# Patient Record
Sex: Female | Born: 1960 | Race: Black or African American | Hispanic: No | Marital: Single | Smoking: Current every day smoker
Health system: Southern US, Community
[De-identification: ages and names within clinical notes are randomized; demographics above are authoritative.]

## PROBLEM LIST (undated history)

## (undated) DIAGNOSIS — E119 Type 2 diabetes mellitus without complications: Secondary | ICD-10-CM

## (undated) DIAGNOSIS — F209 Schizophrenia, unspecified: Secondary | ICD-10-CM

## (undated) DIAGNOSIS — I059 Rheumatic mitral valve disease, unspecified: Secondary | ICD-10-CM

## (undated) DIAGNOSIS — J4489 Other specified chronic obstructive pulmonary disease: Secondary | ICD-10-CM

## (undated) DIAGNOSIS — Z720 Tobacco use: Secondary | ICD-10-CM

## (undated) DIAGNOSIS — J449 Chronic obstructive pulmonary disease, unspecified: Secondary | ICD-10-CM

## (undated) DIAGNOSIS — B1910 Unspecified viral hepatitis B without hepatic coma: Secondary | ICD-10-CM

## (undated) DIAGNOSIS — I351 Nonrheumatic aortic (valve) insufficiency: Secondary | ICD-10-CM

## (undated) DIAGNOSIS — I350 Nonrheumatic aortic (valve) stenosis: Secondary | ICD-10-CM

## (undated) DIAGNOSIS — R569 Unspecified convulsions: Secondary | ICD-10-CM

## (undated) DIAGNOSIS — B20 Human immunodeficiency virus [HIV] disease: Secondary | ICD-10-CM

## (undated) DIAGNOSIS — Z9049 Acquired absence of other specified parts of digestive tract: Secondary | ICD-10-CM

## (undated) DIAGNOSIS — K219 Gastro-esophageal reflux disease without esophagitis: Secondary | ICD-10-CM

## (undated) DIAGNOSIS — I272 Pulmonary hypertension, unspecified: Secondary | ICD-10-CM

## (undated) DIAGNOSIS — I5032 Chronic diastolic (congestive) heart failure: Secondary | ICD-10-CM

## (undated) HISTORY — DX: Gastro-esophageal reflux disease without esophagitis: K21.9

## (undated) HISTORY — DX: Schizophrenia, unspecified: F20.9

## (undated) HISTORY — DX: Acquired absence of other specified parts of digestive tract: Z90.49

## (undated) HISTORY — DX: Unspecified convulsions: R56.9

## (undated) HISTORY — DX: Human immunodeficiency virus (HIV) disease: B20

## (undated) HISTORY — PX: TONSILLECTOMY: SUR1361

## (undated) HISTORY — PX: CHOLECYSTECTOMY: SHX55

## (undated) HISTORY — PX: LEG SURGERY: SHX1003

## (undated) HISTORY — DX: Unspecified viral hepatitis B without hepatic coma: B19.10

## (undated) HISTORY — DX: Chronic obstructive pulmonary disease, unspecified: J44.9

---

## 2002-12-09 ENCOUNTER — Inpatient Hospital Stay (HOSPITAL_COMMUNITY): Admission: EM | Admit: 2002-12-09 | Discharge: 2002-12-13 | Payer: Self-pay | Admitting: Psychiatry

## 2003-04-11 ENCOUNTER — Emergency Department (HOSPITAL_COMMUNITY): Admission: EM | Admit: 2003-04-11 | Discharge: 2003-04-12 | Payer: Self-pay | Admitting: Emergency Medicine

## 2005-11-03 ENCOUNTER — Emergency Department (HOSPITAL_COMMUNITY): Admission: EM | Admit: 2005-11-03 | Discharge: 2005-11-03 | Payer: Self-pay | Admitting: Emergency Medicine

## 2007-12-10 DIAGNOSIS — B20 Human immunodeficiency virus [HIV] disease: Secondary | ICD-10-CM

## 2007-12-10 HISTORY — DX: Human immunodeficiency virus (HIV) disease: B20

## 2008-02-22 ENCOUNTER — Inpatient Hospital Stay (HOSPITAL_COMMUNITY): Admission: EM | Admit: 2008-02-22 | Discharge: 2008-02-27 | Payer: Self-pay | Admitting: Emergency Medicine

## 2008-04-10 ENCOUNTER — Inpatient Hospital Stay (HOSPITAL_COMMUNITY): Admission: EM | Admit: 2008-04-10 | Discharge: 2008-04-11 | Payer: Self-pay | Admitting: Emergency Medicine

## 2008-04-11 ENCOUNTER — Inpatient Hospital Stay (HOSPITAL_COMMUNITY): Admission: AD | Admit: 2008-04-11 | Discharge: 2008-04-29 | Payer: Self-pay | Admitting: Psychiatry

## 2008-04-11 ENCOUNTER — Ambulatory Visit: Payer: Self-pay | Admitting: Psychiatry

## 2008-04-14 ENCOUNTER — Encounter: Payer: Self-pay | Admitting: Psychiatry

## 2008-09-14 ENCOUNTER — Encounter: Admission: RE | Admit: 2008-09-14 | Discharge: 2008-09-14 | Payer: Self-pay | Admitting: Pulmonary Disease

## 2009-05-08 ENCOUNTER — Ambulatory Visit: Payer: Self-pay | Admitting: Diagnostic Radiology

## 2009-05-08 ENCOUNTER — Emergency Department (HOSPITAL_BASED_OUTPATIENT_CLINIC_OR_DEPARTMENT_OTHER): Admission: EM | Admit: 2009-05-08 | Discharge: 2009-05-08 | Payer: Self-pay | Admitting: Emergency Medicine

## 2009-08-04 ENCOUNTER — Emergency Department (HOSPITAL_BASED_OUTPATIENT_CLINIC_OR_DEPARTMENT_OTHER): Admission: EM | Admit: 2009-08-04 | Discharge: 2009-08-04 | Payer: Self-pay | Admitting: Emergency Medicine

## 2009-08-04 ENCOUNTER — Ambulatory Visit: Payer: Self-pay | Admitting: Diagnostic Radiology

## 2009-09-11 ENCOUNTER — Inpatient Hospital Stay (HOSPITAL_COMMUNITY): Admission: EM | Admit: 2009-09-11 | Discharge: 2009-09-13 | Payer: Self-pay | Admitting: Emergency Medicine

## 2009-09-16 ENCOUNTER — Emergency Department (HOSPITAL_BASED_OUTPATIENT_CLINIC_OR_DEPARTMENT_OTHER): Admission: EM | Admit: 2009-09-16 | Discharge: 2009-09-16 | Payer: Self-pay | Admitting: Emergency Medicine

## 2010-06-27 LAB — RAPID URINE DRUG SCREEN, HOSP PERFORMED
Amphetamines: NOT DETECTED
Benzodiazepines: NOT DETECTED
Cocaine: NOT DETECTED
Tetrahydrocannabinol: NOT DETECTED

## 2010-06-27 LAB — URINALYSIS, ROUTINE W REFLEX MICROSCOPIC
Bilirubin Urine: NEGATIVE
Bilirubin Urine: NEGATIVE
Glucose, UA: NEGATIVE mg/dL
Hgb urine dipstick: NEGATIVE
Ketones, ur: 15 mg/dL — AB
Ketones, ur: NEGATIVE mg/dL
Ketones, ur: NEGATIVE mg/dL
Nitrite: NEGATIVE
Nitrite: POSITIVE — AB
Nitrite: NEGATIVE
Protein, ur: 30 mg/dL — AB
Protein, ur: NEGATIVE mg/dL
Protein, ur: NEGATIVE mg/dL
Specific Gravity, Urine: 1.026 (ref 1.005–1.030)
Specific Gravity, Urine: 1.015 (ref 1.005–1.030)
Urobilinogen, UA: 1 mg/dL (ref 0.0–1.0)
Urobilinogen, UA: 0.2 mg/dL (ref 0.0–1.0)
Urobilinogen, UA: 0.2 mg/dL (ref 0.0–1.0)
pH: 5.5 (ref 5.0–8.0)

## 2010-06-27 LAB — BASIC METABOLIC PANEL
BUN: 13 mg/dL (ref 6–23)
BUN: 14 mg/dL (ref 6–23)
CO2: 26 mEq/L (ref 19–32)
Calcium: 9.3 mg/dL (ref 8.4–10.5)
Chloride: 108 mEq/L (ref 96–112)
Chloride: 104 mEq/L (ref 96–112)
Creatinine, Ser: 0.9 mg/dL (ref 0.4–1.2)
GFR calc Af Amer: >60 mL/min (ref >60)
GFR calc non Af Amer: >60 mL/min (ref >60)
Glucose, Bld: 134 mg/dL — ABNORMAL HIGH (ref 70–99)
Glucose, Bld: 106 mg/dL — ABNORMAL HIGH (ref 70–99)
Potassium: 3.9 mEq/L (ref 3.5–5.1)
Potassium: 3.8 mEq/L (ref 3.5–5.1)
Sodium: 142 mEq/L (ref 135–145)

## 2010-06-27 LAB — CBC
HCT: 43.6 % (ref 36.0–46.0)
HCT: 33.6 % — ABNORMAL LOW (ref 36.0–46.0)
HCT: 37 % (ref 36.0–46.0)
HCT: 31.6 % — ABNORMAL LOW (ref 36.0–46.0)
Hemoglobin: 14.3 g/dL (ref 12.0–15.0)
Hemoglobin: 11.3 g/dL — ABNORMAL LOW (ref 12.0–15.0)
MCHC: 32.7 g/dL (ref 30.0–36.0)
MCHC: 32.3 g/dL (ref 30.0–36.0)
MCV: 91.4 fL (ref 78.0–100.0)
MCV: 92.7 fL (ref 78.0–100.0)
MCV: 92.5 fL (ref 78.0–100.0)
Platelets: 220 10*3/uL (ref 150–400)
Platelets: 201 10*3/uL (ref 150–400)
Platelets: 208 10*3/uL (ref 150–400)
Platelets: 424 10*3/uL — ABNORMAL HIGH (ref 150–400)
RBC: 4.78 MIL/uL (ref 3.87–5.11)
RDW: 14.1 % (ref 11.5–15.5)
RDW: 13.8 % (ref 11.5–15.5)
WBC: 6 10*3/uL (ref 4.0–10.5)
WBC: 7.7 10*3/uL (ref 4.0–10.5)
WBC: 8.2 10*3/uL (ref 4.0–10.5)

## 2010-06-27 LAB — COMPREHENSIVE METABOLIC PANEL
ALT: 9 U/L (ref 0–35)
AST: 20 U/L (ref 0–37)
Albumin: 2.7 g/dL — ABNORMAL LOW (ref 3.5–5.2)
Albumin: 3.4 g/dL — ABNORMAL LOW (ref 3.5–5.2)
Alkaline Phosphatase: 77 U/L (ref 39–117)
BUN: 8 mg/dL (ref 6–23)
BUN: 8 mg/dL (ref 6–23)
Calcium: 9.1 mg/dL (ref 8.4–10.5)
Chloride: 112 mEq/L (ref 96–112)
Chloride: 112 mEq/L (ref 96–112)
Creatinine, Ser: 0.8 mg/dL (ref 0.4–1.2)
GFR calc Af Amer: >60 mL/min (ref >60)
Glucose, Bld: 112 mg/dL — ABNORMAL HIGH (ref 70–99)
Potassium: 3.6 mEq/L (ref 3.5–5.1)
Sodium: 138 mEq/L (ref 135–145)
Total Bilirubin: 0.5 mg/dL (ref 0.3–1.2)
Total Bilirubin: 0.3 mg/dL (ref 0.3–1.2)

## 2010-06-27 LAB — ACETAMINOPHEN LEVEL: Acetaminophen (Tylenol), Serum: <10 ug/mL — ABNORMAL LOW (ref 10–30)

## 2010-06-27 LAB — DIFFERENTIAL
Basophils Absolute: 0.1 10*3/uL (ref 0.0–0.1)
Basophils Absolute: 0.1 10*3/uL (ref 0.0–0.1)
Basophils Relative: 1 % (ref 0–1)
Eosinophils Absolute: 0.2 10*3/uL (ref 0.0–0.7)
Eosinophils Absolute: 0.4 10*3/uL (ref 0.0–0.7)
Eosinophils Relative: 2 % (ref 0–5)
Eosinophils Relative: 5 % (ref 0–5)
Eosinophils Relative: 3 % (ref 0–5)
Lymphocytes Relative: 34 % (ref 12–46)
Lymphs Abs: 0.9 10*3/uL (ref 0.7–4.0)
Lymphs Abs: 2.4 10*3/uL (ref 0.7–4.0)
Monocytes Absolute: 0.8 10*3/uL (ref 0.1–1.0)
Monocytes Absolute: 0.4 10*3/uL (ref 0.1–1.0)
Monocytes Relative: 10 % (ref 3–12)
Monocytes Relative: 6 % (ref 3–12)
Neutro Abs: 4.8 10*3/uL (ref 1.7–7.7)
Neutro Abs: 4.1 10*3/uL (ref 1.7–7.7)

## 2010-06-27 LAB — POCT TOXICOLOGY PANEL: Benzodiazepines: POSITIVE

## 2010-06-27 LAB — CULTURE, BLOOD (ROUTINE X 2)

## 2010-06-27 LAB — URINE MICROSCOPIC-ADD ON

## 2010-06-27 LAB — URINE CULTURE

## 2010-06-27 LAB — ETHANOL
Alcohol, Ethyl (B): <5 mg/dL (ref 0–10)
Alcohol, Ethyl (B): <5 mg/dL (ref 0–10)

## 2010-06-27 LAB — GLUCOSE, CAPILLARY: Glucose-Capillary: 114 mg/dL — ABNORMAL HIGH (ref 70–99)

## 2010-06-27 LAB — MRSA PCR SCREENING: MRSA by PCR: NEGATIVE

## 2010-06-27 LAB — SALICYLATE LEVEL: Salicylate Lvl: <4 mg/dL (ref 2.8–20.0)

## 2010-06-28 LAB — URINALYSIS, ROUTINE W REFLEX MICROSCOPIC
Bilirubin Urine: NEGATIVE
Hgb urine dipstick: NEGATIVE
Ketones, ur: NEGATIVE mg/dL
Specific Gravity, Urine: 1.008 (ref 1.005–1.030)
pH: 6.5 (ref 5.0–8.0)

## 2010-06-28 LAB — COMPREHENSIVE METABOLIC PANEL
AST: 40 U/L — ABNORMAL HIGH (ref 0–37)
BUN: 9 mg/dL (ref 6–23)
CO2: 26 mEq/L (ref 19–32)
Chloride: 111 mEq/L (ref 96–112)
Creatinine, Ser: 0.9 mg/dL (ref 0.4–1.2)
GFR calc Af Amer: >60 mL/min (ref >60)
GFR calc non Af Amer: >60 mL/min (ref >60)
Glucose, Bld: 94 mg/dL (ref 70–99)
Total Bilirubin: 1.4 mg/dL — ABNORMAL HIGH (ref 0.3–1.2)

## 2010-06-28 LAB — LITHIUM LEVEL: Lithium Lvl: 0.86 mEq/L (ref 0.80–1.40)

## 2010-06-28 LAB — CBC
HCT: 39.7 % (ref 36.0–46.0)
MCHC: 32.2 g/dL (ref 30.0–36.0)
MCV: 94.2 fL (ref 78.0–100.0)
RBC: 4.22 MIL/uL (ref 3.87–5.11)
WBC: 8.1 10*3/uL (ref 4.0–10.5)

## 2010-06-28 LAB — POCT TOXICOLOGY PANEL

## 2010-06-28 LAB — DIFFERENTIAL
Basophils Absolute: 0 10*3/uL (ref 0.0–0.1)
Eosinophils Relative: 7 % — ABNORMAL HIGH (ref 0–5)
Lymphocytes Relative: 33 % (ref 12–46)
Neutrophils Relative %: 54 % (ref 43–77)

## 2010-07-25 LAB — COMPREHENSIVE METABOLIC PANEL
ALT: 23 U/L (ref 0–35)
ALT: 23 U/L (ref 0–35)
AST: 27 U/L (ref 0–37)
AST: 29 U/L (ref 0–37)
Albumin: 2.9 g/dL — ABNORMAL LOW (ref 3.5–5.2)
Alkaline Phosphatase: 89 U/L (ref 39–117)
CO2: 29 mEq/L (ref 19–32)
Chloride: 104 mEq/L (ref 96–112)
Chloride: 106 mEq/L (ref 96–112)
GFR calc Af Amer: >60 mL/min (ref >60)
GFR calc non Af Amer: 56 mL/min — ABNORMAL LOW (ref >60)
Potassium: 4.6 mEq/L (ref 3.5–5.1)
Sodium: 137 mEq/L (ref 135–145)
Sodium: 138 mEq/L (ref 135–145)
Total Bilirubin: 0.3 mg/dL (ref 0.3–1.2)
Total Protein: 6.6 g/dL (ref 6.0–8.3)

## 2010-07-25 LAB — CBC
HCT: 34.7 % — ABNORMAL LOW (ref 36.0–46.0)
Hemoglobin: 11.5 g/dL — ABNORMAL LOW (ref 12.0–15.0)
Platelets: 245 10*3/uL (ref 150–400)
RBC: 3.69 MIL/uL — ABNORMAL LOW (ref 3.87–5.11)
RBC: 3.75 MIL/uL — ABNORMAL LOW (ref 3.87–5.11)
RDW: 14.2 % (ref 11.5–15.5)
WBC: 4.7 10*3/uL (ref 4.0–10.5)

## 2010-07-25 LAB — DIFFERENTIAL
Basophils Absolute: 0 10*3/uL (ref 0.0–0.1)
Basophils Relative: 2 % — ABNORMAL HIGH (ref 0–1)
Eosinophils Absolute: 0.3 10*3/uL (ref 0.0–0.7)
Eosinophils Relative: 8 % — ABNORMAL HIGH (ref 0–5)
Lymphs Abs: 1.5 10*3/uL (ref 0.7–4.0)
Monocytes Absolute: 0.3 10*3/uL (ref 0.1–1.0)
Monocytes Absolute: 0.4 10*3/uL (ref 0.1–1.0)
Monocytes Relative: 10 % (ref 3–12)

## 2010-07-25 LAB — URINALYSIS, ROUTINE W REFLEX MICROSCOPIC
Glucose, UA: NEGATIVE mg/dL
Hgb urine dipstick: NEGATIVE
Protein, ur: 30 mg/dL — AB

## 2010-07-25 LAB — RAPID URINE DRUG SCREEN, HOSP PERFORMED
Amphetamines: NOT DETECTED
Barbiturates: NOT DETECTED
Opiates: NOT DETECTED

## 2010-07-25 LAB — HEPATITIS PANEL, ACUTE
HCV Ab: NEGATIVE
Hepatitis B Surface Ag: NEGATIVE

## 2010-07-25 LAB — URINE MICROSCOPIC-ADD ON

## 2010-07-25 LAB — HEPATIC FUNCTION PANEL
AST: 19 U/L (ref 0–37)
Albumin: 3.2 g/dL — ABNORMAL LOW (ref 3.5–5.2)
Total Bilirubin: 0.5 mg/dL (ref 0.3–1.2)

## 2010-07-26 ENCOUNTER — Ambulatory Visit (INDEPENDENT_AMBULATORY_CARE_PROVIDER_SITE_OTHER): Payer: Medicaid Other

## 2010-07-26 DIAGNOSIS — B20 Human immunodeficiency virus [HIV] disease: Secondary | ICD-10-CM

## 2010-07-26 DIAGNOSIS — Z113 Encounter for screening for infections with a predominantly sexual mode of transmission: Secondary | ICD-10-CM

## 2010-07-26 LAB — CBC WITH DIFFERENTIAL/PLATELET
Basophils Relative: 0 % (ref 0–1)
Eosinophils Absolute: 0.2 10*3/uL (ref 0.0–0.7)
Hemoglobin: 15.7 g/dL — ABNORMAL HIGH (ref 12.0–15.0)
MCH: 30.5 pg (ref 26.0–34.0)
MCHC: 34.2 g/dL (ref 30.0–36.0)
Monocytes Relative: 6 % (ref 3–12)
Neutrophils Relative %: 43 % (ref 43–77)
Platelets: 241 10*3/uL (ref 150–400)
RDW: 15.4 % (ref 11.5–15.5)

## 2010-07-26 LAB — URINALYSIS, ROUTINE W REFLEX MICROSCOPIC
Glucose, UA: NEGATIVE mg/dL
Specific Gravity, Urine: 1.028 (ref 1.005–1.030)
pH: 5.5 (ref 5.0–8.0)

## 2010-07-27 LAB — GC/CHLAMYDIA PROBE AMP, URINE
Chlamydia, Swab/Urine, PCR: NEGATIVE
GC Probe Amp, Urine: NEGATIVE

## 2010-07-27 LAB — COMPLETE METABOLIC PANEL WITH GFR
AST: 22 U/L (ref 0–37)
BUN: 13 mg/dL (ref 6–23)
CO2: 21 mEq/L (ref 19–32)
Calcium: 10 mg/dL (ref 8.4–10.5)
Chloride: 104 mEq/L (ref 96–112)
Creat: 1.17 mg/dL (ref 0.40–1.20)
GFR, Est African American: 59 mL/min — ABNORMAL LOW (ref >60)

## 2010-07-27 LAB — URINALYSIS, MICROSCOPIC ONLY: Crystals: NONE SEEN

## 2010-07-27 LAB — HIV-1 RNA QUANT-NO REFLEX-BLD: HIV 1 RNA Quant: 25 copies/mL — ABNORMAL HIGH (ref <20)

## 2010-07-27 LAB — T-HELPER CELL (CD4) - (RCID CLINIC ONLY): CD4 T Cell Abs: 670 uL (ref 400–2700)

## 2010-07-27 LAB — RPR

## 2010-08-12 ENCOUNTER — Ambulatory Visit: Payer: Self-pay | Admitting: Adult Health

## 2010-08-18 ENCOUNTER — Encounter: Payer: Self-pay | Admitting: Adult Health

## 2010-08-18 ENCOUNTER — Ambulatory Visit (INDEPENDENT_AMBULATORY_CARE_PROVIDER_SITE_OTHER): Payer: Medicaid Other | Admitting: Adult Health

## 2010-08-18 DIAGNOSIS — B009 Herpesviral infection, unspecified: Secondary | ICD-10-CM | POA: Insufficient documentation

## 2010-08-18 DIAGNOSIS — B20 Human immunodeficiency virus [HIV] disease: Secondary | ICD-10-CM

## 2010-08-18 DIAGNOSIS — J45909 Unspecified asthma, uncomplicated: Secondary | ICD-10-CM | POA: Insufficient documentation

## 2010-08-18 DIAGNOSIS — R05 Cough: Secondary | ICD-10-CM

## 2010-08-18 DIAGNOSIS — Z79899 Other long term (current) drug therapy: Secondary | ICD-10-CM

## 2010-08-18 DIAGNOSIS — F209 Schizophrenia, unspecified: Secondary | ICD-10-CM

## 2010-08-18 DIAGNOSIS — Z21 Asymptomatic human immunodeficiency virus [HIV] infection status: Secondary | ICD-10-CM

## 2010-08-18 MED ORDER — ALBUTEROL SULFATE HFA 108 (90 BASE) MCG/ACT IN AERS: 2.0000 | INHALATION_SPRAY | Freq: Four times a day (QID) | RESPIRATORY_TRACT | Status: DC | PRN

## 2010-08-18 MED ORDER — BECLOMETHASONE DIPROPIONATE 40 MCG/ACT IN AERS: 2.0000 | INHALATION_SPRAY | Freq: Two times a day (BID) | RESPIRATORY_TRACT | Status: DC

## 2010-08-18 MED ORDER — HYDROCOD POLST-CHLORPHEN POLST 10-8 MG/5ML PO LQCR: 5.0000 mL | Freq: Two times a day (BID) | ORAL | Status: DC | PRN

## 2010-08-18 NOTE — Progress Notes (Signed)
Subjective:    Patient ID: [name], female    DOB: 1960/09/04, 50 y.o.   MRN: [id number]  HPI 50 year old [place] American female with a history of HIV, diagnosed in 2009 presents to clinic for initial evaluation and ongoing care. Of her HIV as a transfer from [facility]. She has a past history of medication resistance and is currently taking Kaletra, Isentress, and Truvada for her HIV. She claims adherence to this regimen, with no missed doses and good tolerance. She relates having breathing difficulty, over the past month with chest tightness, wheezing, and chronic cough, along with shortness of breath, or the same period of time. Long term history of asthma, for which she is received albuterol therapy. However, she has not had a refill on her albuterol inhaler for "some time." She also is relating the issue of her Zyprexa not helping her with sleep at night and requesting "something different." This medication was originally prescribed by her psychiatrist.    Review of Systems  Constitutional: Positive for diaphoresis, activity change and fatigue. Negative for fever, chills, appetite change and unexpected weight change.  HENT: Negative for hearing loss, ear pain, nosebleeds, congestion, sore throat, facial swelling, rhinorrhea, sneezing, drooling, mouth sores, trouble swallowing, neck pain, neck stiffness, dental problem, voice change, postnasal drip, sinus pressure, tinnitus and ear discharge.   Eyes: Negative for photophobia, pain, discharge, redness, itching and visual disturbance.  Respiratory: Positive for cough, choking, chest tightness, shortness of breath, wheezing and stridor. Negative for apnea.   Cardiovascular: Negative for chest pain, palpitations and leg swelling.  Gastrointestinal: Negative for nausea, vomiting, abdominal pain, diarrhea, constipation, blood in stool, abdominal distention, anal bleeding and rectal pain.  Genitourinary: Negative for dysuria,  urgency, frequency, hematuria, flank pain, decreased urine volume, vaginal bleeding, vaginal discharge, enuresis, difficulty urinating, genital sores, vaginal pain, menstrual problem, pelvic pain and dyspareunia.  Musculoskeletal: Negative for myalgias, back pain, joint swelling, arthralgias and gait problem.  Skin: Negative for color change, pallor, rash and wound.  Neurological: Negative for dizziness, tremors, seizures, syncope, facial asymmetry, speech difficulty, weakness, light-headedness, numbness and headaches.  Hematological: Negative for adenopathy. Does not bruise/bleed easily.  Psychiatric/Behavioral: Positive for behavioral problems, sleep disturbance, dysphoric mood and decreased concentration. Negative for suicidal ideas, hallucinations, confusion, self-injury and agitation. The patient is nervous/anxious and is hyperactive.        Objective:   Physical Exam  Constitutional: She is oriented to person, place, and time. She appears well-developed. She appears distressed.       Underweight-appearing  HENT:  Head: Normocephalic and atraumatic.  Right Ear: External ear normal.  Left Ear: External ear normal.  Mouth/Throat: No oropharyngeal exudate.  Eyes: Conjunctivae and EOM are normal. Pupils are equal, round, and reactive to light. Right eye exhibits no discharge. Left eye exhibits no discharge. No scleral icterus.  Neck: Normal range of motion. Neck supple. No JVD present. No tracheal deviation present. No thyromegaly present.  Cardiovascular: Regular rhythm, normal heart sounds and intact distal pulses.  Exam reveals no gallop and no friction rub.   No murmur heard.      Pulse rate, tachycardia  Pulmonary/Chest: Accessory muscle usage present. No stridor. Tachypnea noted. She has decreased breath sounds in the right upper field, the right lower field, the left upper field and the left lower field. She has wheezes in the right upper field and the left upper field. She has no  rhonchi. She has no rales.       Poor  air exchange noted. Specifically, in the upper lung fields, as well as lower fields.  Abdominal: Soft. Bowel sounds are normal. She exhibits no distension and no mass. There is no tenderness. There is no rebound and no guarding.  Musculoskeletal: Normal range of motion. She exhibits no edema and no tenderness.  Lymphadenopathy:    She has no cervical adenopathy.  Neurological: She is alert and oriented to person, place, and time. No cranial nerve deficit. She exhibits normal muscle tone. Coordination normal.  Skin: Skin is warm and dry. No rash noted. No erythema. No pallor.  Psychiatric: Thought content normal. Her mood appears anxious. Her speech is rapid and/or pressured. She is is hyperactive. She expresses impulsivity. She does not express inappropriate judgment. She exhibits abnormal recent memory and abnormal remote memory.          Assessment & Plan:  1. Asthma. Currently, demonstrating acute asthma exacerbation. She was given a nebulizer treatment with albuterol 0.083%. Her peak flow pretreatment was 150 mL's. Best of 3 tries. Posttreatment peak flow was again 150 mL's best of 3 tries. Lung exam demonstrated clearing and better air exchange in both upper and lower lung fields. Her coughing has subsequently subsided after her treatment. We will renew her albuterol MDI and begin Qvar 40 mcg 2 puffs twice a day as standard maintenance therapy. We were to order PFTs, but we found. Results from tests performed in January 2012. Recommend further followup with her primary care.  2. HIV. Labs obtained on 07/26/2010 show a CD4 count of 670 at 24% with an HIV viral load of 25 copies per mL. She is apparently stable on her current regimen, which includes Kaletra, Truvada, and Isentress. Recommend continuing present management with repeat labs in 10 weeks and a followup in 3 months. She verbally acknowledged this and agreed with plan of care.  3. Schizophrenia.  Appears somewhat hypomanic today and is addressing issues regarding her current therapy. We recommend she contact her psychiatrist for further evaluation. Regarding her current medications which include Zyprexa and lithium, for either dose modification or medication changes. She verbally acknowledged this and agreed with plan.  4. Routine Health Maintenance. She receives Pap smears from her primary on an annual basis and will be scheduling a followup appointment for a Pap smear within the next "couple months." She was informed if we are unable to obtain results of the Pap smear, we will need to schedule a Pap smear with our clinic, here. She verbally acknowledged this.

## 2010-08-19 NOTE — Progress Notes (Signed)
Phone call to [facility].  [facility].  Calea Hribar, RN 

## 2010-08-19 NOTE — Progress Notes (Signed)
Phone call to [facility].  [facility].  Jennet Maduro, RN

## 2010-08-23 NOTE — Procedures (Signed)
EEG NUMBER:  [id number].   TECHNICIAN:  GQ - DY   PHYSICIAN:  [facility]   REFERRING PHYSICIAN:  None.   This is a portable EEG recording for a patient who is currently  hospitalized at the [facility] due to loss of consciousness and  witnessed seizure that occurred later.  It is now presumed that the  patient had 2 seizures.  She lost bowel and bladder control.  The  patient has a history of HIV and schizophrenia.  There is also a  possible history of IV drug abuse.   CURRENT MEDICATIONS:  Protonix, Kaletra, Truvada antiviral, __________  antiviral, Zithromax, Septra, Bactrim, Keppra.  We just started Zofran,  oxycodone, Dilaudid, Ativan, and Ventolin.   This 50 year old female is admitted with syncope and fall.  The patient  has a history of schizophrenia, and a witnessed seizure in the ER lead  to the EEG recording.   DESCRIPTION:  A posterior dominant background rhythm is seen at 8 Hz.  There is plenty of motion artifacts seen on this recording and this  persists through all montages and review techniques, frequent loss of  impedance.  EKG electrode is the only one that seems to stay steady at  60 beats per minute.  Photic stimulation leads to electromyographic  artifact.  Interestingly, this electromyographic artifact remains at 5  Hz through all stimulation frequencies.  Hyperventilation could not be  performed.  Apparently, the patient has a history of asthma.   CONCLUSION:  This is a completely uninterpretable EEG due to the  patient's motion artifact which seems not to cease for even 10 seconds.  A repeat study is recommended when the patient is able to cooperate.  There seems to be left-sided slowing in some of the __________ reviewed  and muscle artifact is dominantly seen over the right hemisphere.  Again, this may be due to impedance loss and I would strongly recommend  a repeat study.  There was no evidence that the patient ever fell  asleep.  There was no  evidence of clinical seizure activity or seizure-  like  activity according to the technicians.  The study is qualitatively too  poor to allow a statement as to normal versus abnormal EEG.      Melvyn Novas, M.D.  Electronically Signed     EA:[id number]  D:  02/24/2008 22:31:25  T:  02/25/2008 07:12:40  Job #:  [id number]   cc:   [facility]

## 2010-08-23 NOTE — H&P (Signed)
[name], [name]                 ACCOUNT NO.:  [id number]   MEDICAL RECORD NO.:  [id number]          PATIENT TYPE:  INP   LOCATION:  0113                         FACILITY:  [facility]   PHYSICIAN:  Oswald Hillock, MD        DATE OF BIRTH:  [birth year]   DATE OF ADMISSION:  04/10/2008  DATE OF DISCHARGE:                              HISTORY & PHYSICAL   PRIMARY CARE PHYSICIAN:  Unassigned.   CHIEF COMPLAINT:  Vomiting and agitation.   HISTORY OF PRESENT ILLNESS:  Patient is a 50 year old African American  female who is brought to the emergency room via EMS after family  reported complaints of vomiting, abdominal pain, and acute agitation.  Apparently, the patient has been to multiple hospitals in the last few  months, including an admission here at [facility] in November, 2009 and  a subsequent admission in [facility], where she underwent laparoscopic  cholecystectomy.  Patient has been doing well after the surgery;  however, over the last few days, there is a report of vomiting, per the  family; however, the patient denies this, and she has not had any  vomiting in the ER since arrival; however, she continues to be acutely  agitated and wants to go out and smoke and is being uncooperative.   Patient does have a history of schizophrenia, and it is unclear whether  she has been taking her medications.  She denies any cough, fever,  rigors, chills, chest pain, shortness of breath, palpitations,  dizziness, diaphoresis, loss of consciousness, or any focal weakness of  any part of the body.  No reports of any diarrhea or any bleeding p.r.  reported by the patient.   PAST MEDICAL HISTORY:  1. Significant for HIV/AIDS, which was diagnosed in June, 2009      secondary to her having severe unintentional weight loss.  2. She is seen at the infectious disease clinic at [facility].  3. History of asthma.  4. History of schizophrenia.  5.  Questionable history of seizure disorder.  6. History of pulmonary nodules which have been stable per records      available.   CURRENT MEDICATIONS:  1. Keppra 500 mg 1 p.o. b.i.d.  2. Truvada 200/300 1 p.o. daily.  3. Protonix 40 mg once daily.  4. Bactrim DS 1 p.o. daily.  5. Kaletra 200/50 1 p.o. b.i.d.  6. Azithromycin 200 mg p.o. weekly.  7. Isentress 400 mg 1 p.o. b.i.d.  8. Zyprexa 10 mg 1 p.o. nightly.   SOCIAL HISTORY:  Patient is a smoker.  Smokes 1 to 1-1/2 packs per day.  Denies any history of alcohol or recent drug usage.   FAMILY HISTORY:  Patient unable to provide any significant family  history.  None noted in the records available.   REVIEW OF SYSTEMS:  A complete review of systems was done and based on  the information provided by the patient, all systems are negative except  for the positives mentioned in the history of present illness; however,  she is  very noncooperative and her answers are unreliable at best.   PHYSICAL EXAMINATION:  VITALS:  Pulse 106, blood pressure 140/80,  respiratory rate 22, temperature 98.5, O2 sats reported at 98% on room  air.  GENERAL:  A cachectic African American female appearing much older than  her stated age.  Severely agitated.  However, in no acute respiratory  distress.  She denies any pain.  HEENT:  No scleral icterus.  Mild pallor.  Ears negative.  Poor oral  dental hygiene.  NECK:  Supple.  No lymphadenopathy.  No JVD.  CHEST:  Breath sounds heard bilaterally.  Fair air entry.  Bilateral  rhonchi.  CNS:  S1 and S2.  Regular tachycardia.  No murmur, rub or gallop.  ABDOMEN:  There is an area of tenderness in the epigastric region near  the location of the scar from her recent laparoscopic surgery.  No  guarding or rebound.  Bowel sounds present in all 4 quadrants.  EXTREMITIES:  No clubbing, cyanosis or edema.  NEUROLOGIC:  Cranial nerves II-XII are grossly intact.  Patient moves  all 4 extremities.  PSYCH:  Patient  is acutely agitated.  However, no delusions or  hallucinations noted.  No suicidal or homicidal ideation noted either.   LABORATORY DATA:  Her WBC count was 4.7, hemoglobin 9.4, hematocrit  35.5, platelets were noted in clumps, counts appeared adequate.  Urine  drug screen was positive for benzodiazepines.  Lipase was 30.  Sodium  was 138, potassium 4.5, chloride 104, CO2 29, glucose 82, BUN 14,  creatinine 1.05.  Urinalysis was negative.   CT scan of the abdomen and pelvis done in the emergency room revealed  ill-defined soft tissue in the gallbladder, foci extending anteriorly,  which per the radiologist was concerning about developing abscess.  Multiple bilateral pulmonary nodules were noted in the lower lung  fields.   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  1. Abdominal pain/vomiting:  History at best is unreliable.  She has      not had any vomiting in the ER.  Her laboratory parameters,      including CMP, do not show any evidence of significant      abnormalities that would be consistent with her having continuous      vomiting for the last few days.  However, her CT scan findings were      concerning.  [facility] was consulted.  She was evaluated by      Dr. Derrell Lolling.  Per his recommendations, there are no acute surgical      issues on the findings on this CT are considered normal in the      postoperative period after cholecystectomy.  Also, there is no      elevation in her white count.  She has no fever and no acute      metabolic abnormalities.  Will use Zofran on a p.r.n. basis, put      her on IV fluids and put her on a liquid diet and advance as      tolerated.  2. Acute psychosis:  This is her major issue.  She does have a history      of schizophrenia.  We do not know about her  compliance with      medications.  We will use Ativan 1 mg IV q.4h.  p.r.n.  As per      records, she is on Zyprexa.  We will give her a dose intramuscular      now and then keep her on 10 mg once daily.  Psych has been      consulted by the ER physician; however, we have not heard back from      them until now.  She will probably need inpatient psych admission      if she does not improve significantly over the next 24 hours.  3. Human immunodeficiency virus/acquired immunodeficiency syndrome:      We will continue all of her medications, including Truvada,      Bactrim, and Kaletra, in addition to prophylaxis with azithromycin      and Bactrim.  4. History of questionable seizures:  Will continue Keppra 500 mg p.o.      b.i.d.  Records indicate that her EEG the      last time was inconclusive.  5. Deep venous thrombosis/gastrointestinal prophylaxis:  Protonix and      compression devices.  Patient will need one-on-one supervision for      now and will be admitted to the regular medical floor.      Oswald Hillock, MD  Electronically Signed     BA/MEDQ  D:  04/10/2008  T:  04/10/2008  Job:  [id number]   cc:   [facility], [facility]

## 2010-08-23 NOTE — H&P (Signed)
NAME:  [name], [name]                 ACCOUNT NO.:  [id number]   MEDICAL RECORD NO.:  [id number]          PATIENT TYPE:  IPS   LOCATION:  0404                          FACILITY:  [facility]   PHYSICIAN:  Vic Ripper, P.A.-C.DATE OF BIRTH:  [birth year]   DATE OF ADMISSION:  04/11/2008  DATE OF DISCHARGE:                       PSYCHIATRIC ADMISSION ASSESSMENT   This is a 50 year old single African American female. She presented to  the emergency department at [facility] on April 10, 2008  complaining of nausea and vomiting.  She was admitted to the med psych  floor where her nausea and vomiting resolved.  She was seen in  consultation by Dr. Electa Sniff as she is known to be schizophrenic and  apparently had been off of her medications. And Dr. Electa Sniff indicated  that she appeared to have decompensated with respect to her  schizophrenia.  She was delusional. It was not clear whether she had  been taking her Zyprexa with any regularity or whether her underlying  metabolic and physiologic illness had tipped her psychiatric functioning  for the worst. She had responded to initial dose of Zyprexa 10 mg IM  that was given when she was first admitted, and as she did not require  further medical care she was allowed to be transferred here to the  [facility] for further stabilization, etc.   PAST PSYCHIATRIC HISTORY:  She has been an inpatient here at the  [facility] in 2004, [facility] Eddy in 2005.  She is currently  followed as an outpatient at [facility] by Dr. De Nurse.   SOCIAL HISTORY:  She reports having gone to the 11th grade.  She has  never married.  She acknowledges one son about age 57.  She refused to  answer questions about family history or alcohol and drug history. From  her chart it would appear that her social history indicates that she is  a smoker. She smokes 1 to 1-1/2 packs a day.  She denied any history of  alcohol or  any recent drug usage. She did not provide any significant  family history to the medical doctors either.   Her primary care Taziyah Iannuzzi unknown, although she does have a diagnosis  for HIV. And she is followed at [facility] by Dr.  Hortencia Pilar.   MEDICAL PROBLEMS:  She has had a recent cholecystectomy.  She has a  history for seizure disorder, HIV, GERD.   Medications at the time of transfer from the unit: She was on Haldol 5  mg b.i.d., Protonix 40 mg p.o. daily, nicotine patch 21 mg daily, Keppra  500 mg b.i.d., Truvada 200/300 one tablet daily, Bactrim DS 1 tablet  daily, Kaletra 200/50 one tablet b.i.d., azithromycin 200 mg weekly, and  Isentress 400 mg b.i.d.   DRUG ALLERGIES:  She has no known drug allergies.   POSITIVE PHYSICAL FINDINGS:  They are well-documented and on the chart  from her inpatient stay.  At the time she was transferred over to [place] her  vital signs were unable to be obtained due to the  patient being  psychotic and not being willing to cooperate. As soon as I have a firm  set of vital signs, will get those on the chart.   MENTAL STATUS EXAM:  Today she is alert.  She refuses to answer  questions, so it is difficult to determine exactly how oriented she is.  Her speech is normal when she did answer questions. Her mood is  depressed and withdrawn. Her thought processes: She is not currently  psychotic, although she was reported to have been quite delusional and  psychotic during the night.  This stems from her recent cholecystectomy.  She wanted to know what had been taken out of her etc. Judgment and  insight are fair.  Concentration and memory are not able to be  determined. She is not actively suicidal or homicidal.  She has been  responding to internal stimuli at times.   DIAGNOSES:  AXIS I:  Schizophrenia, paranoid type.  AXIS II:  Deferred.  AXIS III:  Human immunodeficiency virus positive. Seizure disorder  reported. Recent  cholecystectomy.  History of pulmonary nodules which  have been stable, per records.  AXIS IV:  Severe.  AXIS V:  35.   Plan is to admit for safety and stabilization to get her to be compliant  with her medications and to adjust them as indicated and will make sure  that she gets back into contact with the [facility].  Estimated  length of stay is 3-5 days.      Vic Ripper, P.A.-C.     MD/MEDQ  D:  04/12/2008  T:  04/12/2008  Job:  [id number]
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
## 2010-08-23 NOTE — Discharge Summary (Signed)
[name], [name]                 ACCOUNT NO.:  [id number]   MEDICAL RECORD NO.:  [id number]          PATIENT TYPE:  INP   LOCATION:  5005                         FACILITY:  [facility]   PHYSICIAN:  Monte Fantasia, MD  DATE OF BIRTH:  10-Mar-1961   DATE OF ADMISSION:  02/22/2008  DATE OF DISCHARGE:  02/27/2008                               DISCHARGE SUMMARY   PRIMARY CARE PHYSICIAN:  Dr. Aida Puffer.   ID PHYSICIAN:  Dr. Wannetta Sender at [facility].   NEUROLOGY CONSULT:  Melvyn Novas, M.D.   DISCHARGE DIAGNOSES:  1. New-onset seizures.  2. 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   COURSE DURING THE HOSPITAL STAY:  A 50 year old patient, African  American female, came in on February 24, 2008, was found by the family  members outside her home apparently unresponsive.  The patient was  brought in to the EMS with witnessed seizures lasted approximately for 2-  3 minutes with tonic-clonic type.  Focal onset was not observed.  The  patient had a loss of bowel and bladder function and was unresponsive  for sometime.  She remained confused for 22 minutes according to the ER  notes.  The patient was admitted and lumbar puncture was performed to  rule out any encephalitis, stroke, meningitis.  The patient remained  mildly confused for 8 hours.  The patient was started on Keppra IV  b.i.d. and then later changed to p.o.  Neurology evaluation which was  called in for the evaluation of seizures.  The patient recommended to  have an EEG and during the hospital stay, an EEG was done at the same  time.  As per the EEG report, there was a lot of motion artifact and  hence EEG was not readable.  A repeat EEG was recommended.  Neurology  later evaluated the patient and recommended to continue Keppra 500  b.i.d. p.o. and will reread the EEG and was going to sign off.  The  patient remained stable with no evidence of any seizure episodes and the  patient is  stable to be discharged.   PROCEDURES DONE DURING THE STAY OF THE HOSPITAL:  CT chest done on  February 22, 2008.  Impression;  Borderline heart size.  No acute findings.   CT head done on February 22, 2008, without contrast.  No acute intracranial abnormality, left periorbital soft tissue swelling  without underlying fracture.   MRA of the head done without contrast.  Impression;  Age advanced atrophies.  Study moderately limited due to the patient's  motion.  No acute intracranial abnormality.   MRA of the head.  Impression;  Diffuse segmental irregularities of the MCA and BCA branch vessels  bilaterally suggestive of intracranial atherosclerotic changes.  Irregularity of the pectus and cavernous segments of the internal  carotid artery were without any significant stenosis.   EEG done on February 24, 2008.  EEG uninterpretable due to motion  artifact.  There seems to be left-sided slowing in some strips reviewed  muscle artifact predominantly seen over the right hemisphere.  No  clinical evidence of seizure  activity as per the technicians.  Would  recommend to have a repeat EEG.   MEDICATIONS AT DISCHARGE:  1. Azithromycin 1200 p.o. every weekly.  2. Truvada 200/300 one tablet p.o. daily.  3. Keppra 500 mg p.o. q.12 h.  4. Kaletra 2 tablets p.o. q.8 h.  5. Raltegravir 400 mg p.o. b.i.d.  6. Protonix 40 mg p.o. q.12 h.  7. Senna 1 tablet p.o. at bedtime.  8. Bactrim DS 1 tablet p.o. daily.   The patient received influenza vaccine on February 25, 2008.   LABORATORY DATA AT THE TIME OF DISCHARGE:  Total WBC 4.6, hemoglobin  13.4, hematocrit 41, and platelet 256.  Sodium 136, potassium 4.0,  chloride 106, bicarb 24, glucose 78, BUN 8, creatinine 0.8, calcium of  9.4.  Urine tox done on admission was negative.  Spinal tap done on  admission.  WBC and CSF 2 within normal range.  RBC 1.  Segmented  neutrophils in CSF, lymphocytes rare, monocyte macrophages rare,   eosinophils 0, other cells 0.  Spinal fluid, glucose 66.  Spinal fluid,  total proteins 27.  UA has been negative.  CD4 counts done February 24, 2008, CD4 count of 130.  T helper cells of 17%.  CSF, crypt antigen has  been negative.  CSF cultures, no organism seen.  No growth for 3 days.  Gram stain, no organism seen in CSF.  Urine cultures, no growth.   ASSESSMENT AND PLAN:  We will plan to discharge the patient.  We will  advise the patient to follow up with the primary care physician in next  week.  We will continue medications as per the discharge medications.  The patient provided with prescriptions.      Monte Fantasia, MD  Electronically Signed     MP/MEDQ  D:  02/27/2008  T:  02/28/2008  Job:  [id number]

## 2010-08-23 NOTE — Discharge Summary (Signed)
[name], [name]                 ACCOUNT NO.:  [id number]   MEDICAL RECORD NO.:  [id number]          PATIENT TYPE:  INP   LOCATION:  1514                         [facility]ACILITY:  [facility]   PHYSICIAN:  [name], M.D.    DATE OF BIRTH:  May 04, 1960   DATE OF ADMISSION:  04/10/2008  DATE OF DISCHARGE:  04/11/2008                               DISCHARGE SUMMARY   DISCHARGE DIAGNOSES:  1. Reported abdominal pain and vomiting.  Resolved completely.  2. Abnormal CT scan of the abdomen and pelvis revealing right upper      quadrant post cholecystectomy, ill-defined soft tissue in the      gallbladder fossa extending anteriorly ( suspect reactive hyperemia      in the adjacent liver, per radiologist's interpretation).  3. Recent history of cholecystectomy.  4. Chronic paranoid type schizophrenia with acute exacerbation      requiring inpatient behavioral health admission for further      management.  5. 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      The patient's most recent CD4 count was 130, February 24, 2008.)      She is followed by his Dr. Wannetta Hensley at [facility].  6. Seizure disorder.  7. History of pulmonary nodules which have been stable per records      available.  8. Tobacco abuse.   DISCHARGE MEDICATIONS:  1. Haldol 5 mg b.i.d.  2. Protonix 40 mg daily.  3. Nicotine patch 21 mg daily.  4. Keppra 500 mg b.i.d.  5. Truvada 200/300 mg 1 tablet daily.  6. Bactrim DS 1 tablet daily.  7. Kaletra 200/50 one tablet b.i.d.  8. Azithromycin 200 mg weekly.  9. Isentress 400 mg b.i.d.   DISCHARGE DISPOSITION:  The patient is currently stable medically.  She  is medically cleared for transfer to inpatient behavioral health.   CONSULTATIONS:  1. Seabron Spates, M.D.  2. Curbside consultation with general surgeon, Dr. Derrell Hensley.   PROCEDURE PERFORMED:  CT scan of the abdomen  on April 10, 2008.  The  results revealed somewhat difficult to  determine exact process in the  right upper quadrant, status post cholecystectomy, given respiratory  motion and lack of oral contrast.  There is an ill-defined soft tissue  in the gallbladder fossa extending anteriorly.  According to the  radiologist, this may represent a developing abscess or inflammatory  phlegmon.  No drainable fluid currently.  Suspect reactive hyperemia in  the adjacent liver.  Multiple bilateral pulmonary nodules.  Calcified  fibroids.  Some areas of enhancement noted in the gluteal muscles  bilaterally, question areas of myositis versus intramuscular injections.   HISTORY OF PRESENT ILLNESS:  The patient is a 50 year old African  American woman with a past medical history significant for  schizophrenia, HIV, and seizure disorder.  She presented to the  emergency department on April 10, 2008 with a chief complaint of  vomiting and agitation.  According to the report from the emergency  department physician, her family had been concerned about the patient  given that she had had persistent vomiting at home.  Also they noted  that the patient was acutely agitated and uncooperative.  The patient  does have a history of schizophrenia and it was unclear whether not she  was taking her medications.  She denied any abdominal pain and vomiting  at the time of the initial hospital assessment.   For additional details please see the dictated history and physical.   HOSPITAL COURSE:  1. REPORTED ABDOMINAL PAIN AND VOMITING.  The  patient was      hemodynamically stable and afebrile at the time of the initial      hospital assessment.  Her white blood cell count was within normal      limits at 4.7.  Her lipase was 30 and her urinalysis was      essentially negative.  Her liver transaminases were within normal      limits.  A CT scan of the abdomen and pelvis was ordered in the      emergency department.  The findings were abnormal; however, there      was some limitation  given  the respiratory motion and lack of oral      contrast taken by the patient.  There was apparently a soft tissue      area within the gallbladder fossa concerning for developing abscess      or inflammatory phlegmon.  However, the radiologist also suspected      that this area was reactive hyperemia in the adjacent liver given      her history of cholecystectomy.  General surgeon, Dr. Derrell Hensley, was      consulted in the emergency department.  He evaluated the patient.      Per his assessment, there were no acute surgical issues regarding      the findings of the CT scan and he considered the findings to be      normal in the postoperative period following a cholecystectomy.  It      was noted that the patient's white blood cell count was within      normal limits and there was no evidence of fever.  He recommended      that the patient be treated conservatively.  Over the course of the      hospitalization, the patient's abdominal pain and vomiting      completely resolved.  She was started on a liquid diet that was      eventually advanced to a regular diet.  She ate almost 100% of      breakfast and lunch today without any acute pain or vomiting.  2. AGITATION/ACUTE DECOMPENSATED SCHIZOPHRENIA.  The patient      apparently tried to elope and was somewhat agitated and      uncooperative in the emergency department.  The admitting physician      discussed the patient with psychiatrist Dr. Electa Hensley who recommended      Zyprexa 10 mg intramuscularly.  In addition, as needed Ativan was      added for management.  Dr. Electa Hensley evaluated the patient this      afternoon.  Per his assessment, the patient was obviously      experiencing an acute exacerbation of her schizophrenia.  He      advised adding Haldol 5 mg b.i.d. and if an intramuscular      antipsychotic is needed again for acute agitation then Zyprexa      might be helpful again.  He strongly advised further evaluation and  management in an inpatient psychiatric unit.  Arrangements are now      being made for the patient to be transferred to behavioral health.      Of note, a 24-hour sitter was ordered for the patient's protection      and to prevent elopement.  3. HIV/AIDS.  The patient was maintained on all of her antiretroviral      medications.  Apparently she is followed by the ID clinic at [facility].  I question whether not the      patient has been compliant with appropriate follow-up and with      medication management.  Nevertheless, she was continued on the      antiretrovirals.  4. ADDITIONAL LAB DATA.  The patient's urinalysis was noted to be      essentially negative, with exception of      greater than 30 protein and a mildly elevated specific gravity.      Lactic acid was 3.9.  This finding was nonspecific as the patient      had no fever, had no leukocytosis and her bicarbonate/CO2 was      within normal limits at 26.  Her urine drug screen was positive for      benzodiazepines.      [name], M.D.  Electronically Signed     DF/MEDQ  D:  04/11/2008  T:  04/11/2008  Job:  [id number]

## 2010-08-23 NOTE — Consult Note (Signed)
NAME:  [name], [name]                 ACCOUNT NO.:  [id number]   MEDICAL RECORD NO.:  [id number]          PATIENT TYPE:  INP   LOCATION:  1514                         FACILITY:  [facility]   PHYSICIAN:  Anselm Jungling, MD  DATE OF BIRTH:  06/14/1960   DATE OF CONSULTATION:  04/11/2008  DATE OF DISCHARGE:                                 CONSULTATION   IDENTIFYING DATA/REASON FOR REFERRAL:  The patient is a 50 year old  African American female who has a history of chronic schizophrenia, is  HIV positive, and has seizure disorder.  She was admitted through the  emergency department due to increasing vomiting, abdominal pain and  acute agitation.  A Psychiatric consultation is requested because of her  psychiatric history and features.   HISTORY OF PRESENTING PROBLEMS:  The patient has a history of  schizophrenia.  My review of the [facility] record system indicates that  she was hospitalized psychiatrically in 2004 in our inpatient psychiatry  service for psychosis.  She was treated with Haldol at that time.  It is  not clear to what degree she has been psychiatrically followed since  then.  Her current medication list includes Zyprexa 10 mg q.h.s.  It is  not clear whether she has been compliant with this.   Since her admission, she has been acutely agitated, has been  uncooperative, has shown disorganized behavior and has attempted to,  among other things, pull out her IV lines.   On the evening of April 10, 2008, Dr. Ninfa Linden called the undersigned to  discuss short-term medication strategies.  It was agreed that Zyprexa 10  mg IM would be reasonable, and it was arranged through the pharmacy for  this to be administered.  It was.  The patient is continued on one-to-  one observation since then and has been more compliant.   MENTAL STATUS AND OBSERVATIONS:  The patient is a petite, thin, ill-  appearing woman who was dozing when I came in to visit her.  She woke  easily however.  She  seems to be generally oriented, although not to  specific date.  She appears to be somewhat delusional in regards to the  reasons for being here.  She tells me that she came in to have some  stitches removed.  She also tells me that she had her gallbladder  removed last night, and that they had to check her to make sure she was  not having a baby.  She tells me that she sees no reason why she should  not be able to go home.   She is mildly irritable, but not agitated today, nor is she sedated.  There do not appear to be any evident adverse side effects to medication  or abnormal involuntary movements.   Overall, her level of insight and judgment appears to be very poor.   MEDICAL HISTORY:  The patient has a history of HIV/AIDS diagnosed in  June 2009, and she has had associated severe weight loss.  She has been  seen at [facility].  She also has a history of asthma, and  pulmonary nodules.  She has a questionable history of seizure disorder,  which was apparently evaluated extensively in November 2009.   CURRENT MEDICATIONS:  Keppra, Truvada, Protonix, Bactrim DS, Kaletra,  azithromycin, Isentress, and Zyprexa.   IMPRESSION:  [name] appears to be decompensated with respect to her  history of schizophrenia.  She appears to be delusional at this point.  It is not clear whether she has been taking her Zyprexa with any  regularity, with this being the explanation for her decompensation, or  whether underlying metabolic or physiologic illness or disturbance has  tipped her psychiatric functioning for the worse.  At this point, she  seems to have had a beneficial response to the initial Zyprexa 10 mg IM  that was given yesterday evening.   DIAGNOSTIC IMPRESSION:  AXIS I:  Schizophrenia, chronic paranoid type,  acute exacerbation.  AXIS II:  Deferred.  AXIS III:  History of seizure disorder.  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  AXIS IV:  Stressors severe.  AXIS V:  GAF 35.   RECOMMENDATIONS:  At this time, I think it is reasonable to continue  antipsychotic therapy.  For now, I would provide Haldol 5 mg b.i.d.  If  an intramuscular antipsychotic is needed again for acute agitation, then  Zyprexa might be helpful again.   If she were medically cleared at this point, or, if she is in this same  mental status at the time she is medically cleared, whether that be  later today, tomorrow, or the next day, that she might be a candidate  for inpatient psychiatry treatment.  Otherwise, while she is undergoing  medical stabilization here, Psychiatry can to continue to provide follow-  up, and then determination about her psychiatric treatment needs can be  made at the time that she eventually is medically cleared.      Anselm Jungling, MD  Electronically Signed     SPB/MEDQ  D:  04/11/2008  T:  04/11/2008  Job:  [id number]

## 2010-08-23 NOTE — H&P (Signed)
NAMEMIABELLA, SHANNAHAN                 ACCOUNT NO.:  [id number]   MEDICAL RECORD NO.:  [id number]          PATIENT TYPE:  INP   LOCATION:  4730                         FACILITY:  [facility]   PHYSICIAN:  Della Goo, M.D. DATE OF BIRTH:  Sep 30, 1960   DATE OF ADMISSION:  02/22/2008  DATE OF DISCHARGE:                              HISTORY & PHYSICAL   PRIMARY CARE PHYSICIAN:  Unassigned.   CHIEF COMPLAINT:  Passed out.   HISTORY OF PRESENT ILLNESS:  This is a 50 year old female who was found  outside her home passed out on the ground by her family.  Emergency  medical services were called and the patient was brought to the  emergency department where in the emergency department the patient  suffered a seizure which was witnessed and lasted approximately 3.5  minutes and was described as being tonic-clonic in nature.  The patient  also was unresponsive during the seizure and had loss of bowel and  bladder function.  The patient was also postictal afterward.  The  patient was evaluated in the emergency department and had laboratory  studies performed as well as a lumbar puncture performed and was  referred for admission..  At the time of my interview with the patient,  the patient was responsive and able answer questions but mildly  confused.  Her sister is at the bedside and assists with giving her  medical history.  The patient denies having any fevers, chills prior to  the event.  She denies having any chest pain or shortness of breath.  She denies having nausea, vomiting, diarrhea.   PAST MEDICAL HISTORY:  Significant for HIV disease which was diagnosed  in June 2009 secondary to patient having symptoms of severe  unintentional weight loss.  She receives treatment for her HIV disease  at the Infectious Diseases clinic at [facility].  She reports taking her medications regularly and denies  missing any of her medical regimen.  The patient also has a  history of  asthma and schizophrenia.   Her medications include Kaletra 200/50 mg one to 2 tablets p.o. b.i.d.  Truvada 200/300 mg 1 tablet p.o. q.a.m. Raltegravir 400 mg one p.o.  b.i.d., azithromycin 500 mg 2 tablets p.o. on Thursdays weekly. Bactrim  DS 1 p.o. q.a.m.   ALLERGIES:  No known drug allergies.   SOCIAL HISTORY:  The patient is a smoker and smokes 1 to 1-1/2 packs  daily.  She denies any history of any recent illicit drug or alcohol  usage.   REVIEW OF SYSTEMS:  Pertinents are mentioned above.   PHYSICAL EXAMINATION FINDINGS:  GENERAL APPEARANCE:  This is a 47-year-  old, thin female in discomfort but no acute distress.  VITAL SIGNS:  Temperature 98.5, blood pressure 142/85, heart rate 106  initially, now it is 79. Respirations 16-23. Her O2 saturations 98-100%.  HEENT: Normocephalic. Positive facial contusions around the left  periorbital area and multiple excoriations on the face.  Pupils are  equally round and reactive to light.  There is no scleral icterus or  conjunctival hemorrhage.  Extraocular  movements are intact and  funduscopic is benign. Oropharynx is clear.  There are no tongue  lacerations that are visible.  NECK: Supple with full range of motion.  No thyromegaly, adenopathy, or  jugulovenous distention.  CARDIOVASCULAR:  Regular rate and rhythm.  No  murmurs, gallops or rubs.  LUNGS: Clear to auscultation bilaterally.  ABDOMEN: Positive bowel sounds, soft, nontender, nondistended.  EXTREMITIES: Without cyanosis, clubbing or edema. Of note the patient  has a right lower extremity area defect which appears to be secondary to  a prior history of a trauma and possible surgical intervention.  There  is a muscular defect of the medial aspect of the anterior tibial area.  This area is large and takes up two-thirds of the lower aspect of the  lower extremity.  Her dorsal pedal pulses are intact in both feet.  NEUROLOGIC:  The patient is mildly confused.  Her  speech is clear.  There are no focal deficits on examination.   LABORATORY STUDIES:  White blood cell count 7.7, hemoglobin 11.9,  hematocrit 36.7, platelets 271, neutrophils 75%, lymphocytes 15%.  Sodium 131, potassium 3.2, chloride 101, bicarb 19, BUN 12, creatinine  0.91 and glucose 122. Albumin 3.3 and magnesium 1.7.  Pro time 13.8, INR  1 and PTT 30.  Alcohol level less than 5.  CT scan of the head without  contrast negative for any acute intracranial findings. Left periorbital  swelling is present.   ASSESSMENT:  A 50 year old female being admitted with:  1. Syncopal episode.  2. Seizure.  3. Mild hypokalemia  4. Mild anemia.  5. Human immunodeficiency virus disease.   PLAN:  The patient will be admitted to telemetry area for cardiac  monitoring.  The patient will be placed on neurologic checks and cardiac  enzymes will also be performed.  Orthostatic vital signs will also be  checked q.a.m.  Potassium repletion has been ordered as well, and  further studies to work up her syncopal episode and seizures have been  ordered, an MRI/MRA study as well as an EEG study and an EKG.  The  patient will continue on her regular medications and DVT and GI  prophylaxis have also been ordered.  A lumbar puncture has been  performed in the emergency department, and the studies are pending at  this time.  This patient will be signed out to the rounding team and her  medical records will be requested from [facility].      Della Goo, M.D.  Electronically Signed     HJ/MEDQ  D:  02/23/2008  T:  02/23/2008  Job:  [id number]

## 2010-08-26 MED ORDER — ALBUTEROL SULFATE (2.5 MG/3ML) 0.083% IN NEBU: 2.5000 mg | INHALATION_SOLUTION | RESPIRATORY_TRACT | Status: DC

## 2010-08-26 MED ORDER — ALBUTEROL SULFATE (2.5 MG/3ML) 0.083% IN NEBU
2.5000 mg | INHALATION_SOLUTION | Freq: Once | RESPIRATORY_TRACT | Status: AC
  Administered 2010-08-26: 2.5 mg via RESPIRATORY_TRACT
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
## 2010-08-26 NOTE — Discharge Summary (Signed)
**Note Jackie-Identified via Obfuscation** NAME:  [name], [name]                 ACCOUNT NO.:  [id number]   MEDICAL RECORD NO.:  [id number]          PATIENT TYPE:  IPS   LOCATION:  0404                          [facility]  [facility]   PHYSICIAN:  Anselm Jungling, MD  DATE OF BIRTH:  February 06, 1961   DATE OF ADMISSION:  04/11/2008  DATE OF DISCHARGE:  04/29/2008                               DISCHARGE SUMMARY   IDENTIFYING DATA AND REASON FOR ADMISSION:  This was an inpatient  psychiatric admission for [name], an unmarried [place] American female,  age 50, with HIV-AIDS.  She was admitted due to increasing signs and  symptoms of schizophrenia, in association with dementia.  Please refer  to the admission note for further details pertaining to the symptoms,  circumstances and history that led her hospitalization.  She was given  an initial Axis I diagnosis of schizophrenia, acute exacerbation.   MEDICAL AND LABORATORY:  The patient came to [place] with multiple medical  problems, including HIV-AIDS.  She had come to [place] directly from [facility] where she had been admitted due to various medical  complications related to her AIDS.  Upon admission to the inpatient  psychiatric service, she was medically and physically assessed and then  followed by the psychiatric nurse practitioner.  She also had a history  of seizure disorder.  She was continued on her usual regimen of Keppra,  Truvada, Protonix for GERD, Kaletra, Septra, Bactrim, Isentress, and  Zithromax.   HOSPITAL COURSE:  The patient was admitted to the adult inpatient  psychiatric service.  She presented as a diminutive, thin, ill-appearing  woman who was disoriented and confused throughout her entire hospital  stay.  She required one-to-one nursing, initially due to her level of  weakness and need for assistance with transfers, etc.  She spent most of  the first week in her hospital bed.  She was continued on medication  regimen that included Zyprexa 20 mg every night to address  psychotic  symptoms, and Ativan 2 mg b.i.d. to address agitation and anxiety.  After approximately the first week or 10 days of her hospital stay, she  developed more energy, and from this point forward, was able to spend  much greater part of her day in a wheelchair, up and active in the  therapeutic milieu.  However, she was not able to participate in  therapeutic groups and activities very meaningfully, due to the level of  her psychosis as well as dementia.   Case management was in touch with the patient's family throughout her  stay.  They indicated the desire to have the patient transferred to a  particular assisted-living facility geared towards individuals with AIDS  in the state of [place].  We went through the process of applying for  placement at this particular facility, and providing them the necessary  clinical information for them to make a determination to accept the  patient.   The patient was discharged on the 19th hospital day.  Although the  ultimate plan was still for the patient to go to the facility referenced  above  in [place], the immediate plan was for her to stay with family  for 1-2 weeks further here in [place] prior to transportation to [place].   AFTERCARE:  As above.  The patient's interim local mental health  [name] was to be [facility], with an intake  appointment there on May 07, 2008, at 8:30 a.m.   DISCHARGE MEDICATIONS:  1. Keppra 500 mg b.i.d.  2. Truvada 1 tablet daily.  3. Protonix 40 mg b.i.d.  4. Kaletra 2 tablets twice daily.  5. Zithromax 1200 mg every 7 days.  6. Septra Bactrim, 1 tablet daily.  7. Isentress 400 mg b.i.d.  8. Zyprexa 20 mg every night.  9. Ativan 2 mg b.i.d.   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  AXIS II:  Deferred.  AXIS III:  Multiple medical problems, acquired immunodeficiency  syndrome,  human immunodeficiency virus, gastroesophageal reflux disease.  AXIS IV:  Stressors severe.  AXIS V:  GAF on discharge 30.      Anselm Jungling, MD  Electronically Signed     SPB/MEDQ  D:  05/01/2008  T:  05/01/2008  Job:  [id number]
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
## 2010-08-26 NOTE — Progress Notes (Signed)
Addended by: Domenick Gong on: 08/26/2010 03:41 PM   Modules accepted: Orders

## 2010-09-14 ENCOUNTER — Ambulatory Visit: Payer: Medicaid Other | Admitting: Adult Health

## 2010-10-03 ENCOUNTER — Other Ambulatory Visit: Payer: Self-pay | Admitting: *Deleted

## 2010-10-03 DIAGNOSIS — B20 Human immunodeficiency virus [HIV] disease: Secondary | ICD-10-CM

## 2010-10-03 MED ORDER — LOPINAVIR-RITONAVIR 200-50 MG PO TABS: 2.0000 | ORAL_TABLET | Freq: Two times a day (BID) | ORAL | Status: DC

## 2010-10-03 MED ORDER — EMTRICITABINE-TENOFOVIR DF 200-300 MG PO TABS: 1.0000 | ORAL_TABLET | Freq: Every day | ORAL | Status: DC

## 2010-10-03 MED ORDER — RALTEGRAVIR POTASSIUM 400 MG PO TABS: 400.0000 mg | ORAL_TABLET | Freq: Two times a day (BID) | ORAL | Status: DC

## 2010-10-21 ENCOUNTER — Inpatient Hospital Stay (HOSPITAL_COMMUNITY)
Admission: EM | Admit: 2010-10-21 | Discharge: 2010-10-24 | DRG: 640 | Disposition: A | Discharge status: Home / Self Care | Payer: Medicaid Other | Attending: Internal Medicine | Admitting: Internal Medicine

## 2010-10-21 DIAGNOSIS — F200 Paranoid schizophrenia: Secondary | ICD-10-CM | POA: Diagnosis present

## 2010-10-21 DIAGNOSIS — F29 Unspecified psychosis not due to a substance or known physiological condition: Secondary | ICD-10-CM | POA: Diagnosis present

## 2010-10-21 DIAGNOSIS — E871 Hypo-osmolality and hyponatremia: Principal | ICD-10-CM | POA: Diagnosis present

## 2010-10-21 DIAGNOSIS — J45909 Unspecified asthma, uncomplicated: Secondary | ICD-10-CM | POA: Diagnosis present

## 2010-10-21 DIAGNOSIS — F17200 Nicotine dependence, unspecified, uncomplicated: Secondary | ICD-10-CM | POA: Diagnosis present

## 2010-10-21 DIAGNOSIS — B20 Human immunodeficiency virus [HIV] disease: Secondary | ICD-10-CM | POA: Diagnosis present

## 2010-10-21 LAB — COMPREHENSIVE METABOLIC PANEL
AST: 22 U/L (ref 0–37)
CO2: 23 mEq/L (ref 19–32)
Calcium: 9.7 mg/dL (ref 8.4–10.5)
Creatinine, Ser: 0.93 mg/dL (ref 0.50–1.10)
GFR calc Af Amer: >60 mL/min (ref >60)
GFR calc non Af Amer: >60 mL/min (ref >60)
Glucose, Bld: 120 mg/dL — ABNORMAL HIGH (ref 70–99)
Total Protein: 8.1 g/dL (ref 6.0–8.3)

## 2010-10-21 LAB — RAPID URINE DRUG SCREEN, HOSP PERFORMED
Amphetamines: NOT DETECTED
Barbiturates: NOT DETECTED
Benzodiazepines: NOT DETECTED
Cocaine: NOT DETECTED

## 2010-10-21 LAB — DIFFERENTIAL
Basophils Absolute: 0 10*3/uL (ref 0.0–0.1)
Eosinophils Relative: 1 % (ref 0–5)
Lymphocytes Relative: 32 % (ref 12–46)
Lymphs Abs: 2.7 10*3/uL (ref 0.7–4.0)
Monocytes Absolute: 0.8 10*3/uL (ref 0.1–1.0)
Neutro Abs: 4.7 10*3/uL (ref 1.7–7.7)

## 2010-10-21 LAB — CBC
HCT: 37 % (ref 36.0–46.0)
MCV: 85.6 fL (ref 78.0–100.0)
RBC: 4.32 MIL/uL (ref 3.87–5.11)
RDW: 14 % (ref 11.5–15.5)
WBC: 8.3 10*3/uL (ref 4.0–10.5)

## 2010-10-21 LAB — ETHANOL: Alcohol, Ethyl (B): <11 mg/dL (ref 0–11)

## 2010-10-22 DIAGNOSIS — F2089 Other schizophrenia: Secondary | ICD-10-CM

## 2010-10-22 LAB — BASIC METABOLIC PANEL
BUN: 7 mg/dL (ref 6–23)
CO2: 28 mEq/L (ref 19–32)
Calcium: 9.6 mg/dL (ref 8.4–10.5)
Creatinine, Ser: 0.86 mg/dL (ref 0.50–1.10)
Glucose, Bld: 81 mg/dL (ref 70–99)

## 2010-10-22 LAB — URINE MICROSCOPIC-ADD ON

## 2010-10-22 LAB — URINALYSIS, ROUTINE W REFLEX MICROSCOPIC
Bilirubin Urine: NEGATIVE
Glucose, UA: NEGATIVE mg/dL
Hgb urine dipstick: NEGATIVE
Ketones, ur: NEGATIVE mg/dL
Protein, ur: NEGATIVE mg/dL

## 2010-10-22 LAB — OSMOLALITY, URINE: Osmolality, Ur: 104 mOsm/kg — ABNORMAL LOW (ref 390–1090)

## 2010-10-23 DIAGNOSIS — F3110 Bipolar disorder, current episode manic without psychotic features, unspecified: Secondary | ICD-10-CM

## 2010-10-23 DIAGNOSIS — F200 Paranoid schizophrenia: Secondary | ICD-10-CM

## 2010-10-23 LAB — COMPREHENSIVE METABOLIC PANEL
AST: 23 U/L (ref 0–37)
Albumin: 3.1 g/dL — ABNORMAL LOW (ref 3.5–5.2)
BUN: 11 mg/dL (ref 6–23)
Chloride: 104 mEq/L (ref 96–112)
Creatinine, Ser: 0.85 mg/dL (ref 0.50–1.10)
Potassium: 4.3 mEq/L (ref 3.5–5.1)
Total Bilirubin: 0.2 mg/dL — ABNORMAL LOW (ref 0.3–1.2)
Total Protein: 7 g/dL (ref 6.0–8.3)

## 2010-10-23 LAB — OSMOLALITY: Osmolality: 273 mOsm/kg — ABNORMAL LOW (ref 275–300)

## 2010-10-23 LAB — CBC
MCH: 29.3 pg (ref 26.0–34.0)
Platelets: 306 10*3/uL (ref 150–400)
RBC: 4.26 MIL/uL (ref 3.87–5.11)
RDW: 14.9 % (ref 11.5–15.5)
WBC: 5.5 10*3/uL (ref 4.0–10.5)

## 2010-10-23 LAB — DIFFERENTIAL
Basophils Relative: 1 % (ref 0–1)
Eosinophils Absolute: 0.2 10*3/uL (ref 0.0–0.7)
Eosinophils Relative: 4 % (ref 0–5)
Neutrophils Relative %: 44 % (ref 43–77)

## 2010-10-23 LAB — PHOSPHORUS: Phosphorus: 3.7 mg/dL (ref 2.3–4.6)

## 2010-10-23 LAB — PRO B NATRIURETIC PEPTIDE: Pro B Natriuretic peptide (BNP): 240.4 pg/mL — ABNORMAL HIGH (ref 0–125)

## 2010-10-23 LAB — MAGNESIUM: Magnesium: 2.1 mg/dL (ref 1.5–2.5)

## 2010-10-24 NOTE — Consult Note (Signed)
NAME:  KOA, PALLA                 ACCOUNT NO.:  [id number]  MEDICAL RECORD NO.:  [id number]  LOCATION:  1505                         FACILITY:  [facility]  PHYSICIAN:  Conni Slipper, MDDATE OF BIRTH:  [birth year]  DATE OF CONSULTATION:  10/23/2010 DATE OF DISCHARGE:                                CONSULTATION   HISTORY:  [name] is a 50 year old single African American female who was admitted to [facility] Floor for hyponatremia and asthma.  She also has paranoid schizophrenia.  Review of the records indicated the patient has an involuntary commitment petition by her brother.  Information for this evaluation obtained with interview with the patient and also discussing with her mother.  Reportedly the patient has been acting out recently, screaming, yelling, talking loud, rambling, especially getting irritable and agitated about smoking cigarettes.  The patient's mom reported she smokes up to 3 packs a day if she allows her but she has been limiting her for half pack to one pack a day.  The patient reported she was upset, irritable, emotional, crying when she was not able to get the cigarettes from her mother.  The patient's mother reported she has been receiving ACT Team services and seeing a psychiatrist from [facility] for few years.  She has been compliant with her medication and she has not reported adverse effect of the medication.  She has no involuntary movements.  The patient denied any history of suicidal or homicidal ideation, aggressive behavior, or destruction of property.  The patient's mother endorses the history.  PAST PSYCHIATRIC HISTORY:  The patient was admitted to [facility] and [facility] in the past.  She was receiving a ACT Team services from the [facility].  MEDICAL PROBLEMS: 1. The patient has been suffering with HIV and been receiving     treatment from the [facility]. 2.  She also has smoking-induced chronic cough or asthma.  MEDICATIONS: 1. Qvar. 2. Kaletra. 3. Truvada. 4. Isenstress for HIV.  She also takes; 1. Risperdal 4 mg a day. 2. Cogentin 2 mg a day. 3. Tegretol 400 mg. 4. Lithium 300 mg twice a day.  Her home medication indicated, she stopped taking her Zyprexa and lithium.  SOCIAL HISTORY:  She lives at home with her mother, brother, and a baby nephew.  She is disabled and denied any current use of drugs of abuse or alcohol but smokes cigarettes.  MENTAL STATUS EXAMINATION:  The patient appeared lying down in her bed, baby sitter next to her.  She was awake, alert, oriented to person, place, and time.  She was able to calm herself and redirect when she was loud.  The patient reported she was loud and does not realize how loud she was.  The patient has a linear and goal-directed thought process. She does not recall very specific details of her treatment in the past. She has increased rate of speech and volume of speech.  The patient is combative, tangential, and can be circumstantial.  The patient has no current evidence of delusions, hallucinations.  She has no suicidal or homicidal ideations, intentions, or plan.  She has  fair-to-poor insight, judgment and impulse control.  DIAGNOSES:  Axis I:  Schizophrenia, paranoid type, chronic.  Compliant with her medications. Axis II:  Deferred. Axis III:  Human immunodeficiency virus positive, on highly active antiretroviral therapy; chronic tobacco smoker, refused to obtain treatment or patches.  She has hyponatremia.  TREATMENT PLAN:  Recommended to continue her psychiatric medications as it is in the home medications and agree with increasing Tegretol 200 mg morning to 400 mg at bedtime for better control of her hypomania.  The patient does not meet acute psychiatric hospitalization due to lack of safety concerns at this time but she may need to be reevaluated when she is ready to be  discharged.  So, please call psychiatric consult on [name] when she was ready to be discharged.  Thank you for psychiatric consult.     Conni Slipper, MD     JRJ/MEDQ  D:  10/23/2010  T:  10/23/2010  Job:  [id number]  Electronically Signed by Leata Mouse MD on 10/24/2010 12:38:24 PM

## 2010-11-07 NOTE — H&P (Signed)
[name], [name]                 ACCOUNT NO.:  [id number]  MEDICAL RECORD NO.:  [id number]  LOCATION:  1505                         FACILITY:  [facility]  PHYSICIAN:  Kathlen Mody, MD       DATE OF BIRTH:  [birth year]  DATE OF ADMISSION:  10/21/2010 DATE OF DISCHARGE:                             HISTORY & PHYSICAL   PRIMARY CARE PHYSICIAN:  None.  CHIEF COMPLAINT:  Was brought in by police for psychosis.  HISTORY OF PRESENT ILLNESS:  As per the ER notes, this is a 50 year old lady with history of paranoid psychosis, HIV positive, on HAART therapy, asthma, was brought into the ER by police for psychosis.  Basically, the patient has lost her brother and she was screaming and was in psychosis and she was brought to ER and has IVC papers filled out.  On arrival to the ER, the patient had initial blood work drawn and she was found to have sodium of 121 and she had a repeat basic metabolic panel and her sodium was 129 and hospitalist service was requested to admit the patient for hyponatremia.  On further questioning, the patient denies any chest pain, shortness of breath but she admits to occasional cough bringing out clear phlegm.  Denies any fever.  Denies any abdominal pain.  Occasional nausea.  No vomiting or diarrhea.  The patient denies any headache or blurry vision.  The patient has some tingling and numbness in her right lower extremity.  The patient denies any weakness or any focal deficits at this time.  REVIEW OF SYSTEMS:  See HPI, otherwise negative.  PAST MEDICAL HISTORY: 1. History of HIV positive/AIDS, on HAART therapy, [facility]. 2. Asthma. 3. Paranoid schizophrenia. 4. Questionable history of seizure disorder. 5. History of pulmonary nodule.  ALLERGIES:  No known drug allergies.  SOCIAL HISTORY:  The patient denies any alcohol intake or recreational drug use.  She continues to smoke cigarettes.  FAMILY HISTORY:  Not significant.  PHYSICAL  EXAMINATION:  VITAL SIGNS:  She is afebrile, temperature of 98.2, blood pressure of 128/70, pulse of 82 per minute, respiratory rate of 17, saturating 100% on room air. GENERAL:  She is alert, afebrile, comfortable, in no acute distress. CARDIOVASCULAR:  S1 and S2 heard. RESPIRATORY:  Chest is clear to auscultation bilaterally. ABDOMEN:  Soft, nontender, nondistended. EXTREMITIES:  No pedal edema. NEUROLOGICAL:  Nonfocal.  The patient is alert, oriented x3.  PERTINENT LABS:  The patient had a CBC done with differential which was within normal limits.  Has a urine drug screen which was normal.  The patient had a comprehensive metabolic panel significant for sodium of 121, chloride of 86 and repeat basic metabolic panel shows sodium of 129.  Alcohol level less than 11.  RADIOLOGY:  None.  ASSESSMENT AND PLAN: 1. This is a 50 year old lady with history of paranoid schizophrenia,     admitted for psychosis and has involuntary commitment papers with     her.  Also was found to have hyponatremia.  She is being admitted     for evaluation of hyponatremia.  Hyponatremia most likely secondary     to  dehydration.  The patient was started on IV fluids, normal     saline at 75 mL per hour.  Will rule out SIADH.  Will get a urine     osmolality, serum osmolality.  We will get a TSH level and a.m.     cortisol level.  We will repeat labs in the morning. 2. Acute exacerbation of paranoid schizophrenia.  The patient at this     time appears to be calm and rationalize.  Will get a current     psychiatric evaluation.  Will continue with her medications of     Cogentin and Risperdal at this time. 3. Human immunodeficiency virus.  We will continue with her Kaletra. 4. Deep vein thrombosis prophylaxis, subcutaneous Lovenox. 5. Asthma, appears to be stable. Continue with albuterol p.r.n. 6. The patient is full code at this time.          ______________________________ Kathlen Mody,  MD     VA/MEDQ  D:  10/22/2010  T:  10/22/2010  Job:  [id number]  Electronically Signed by Kathlen Mody MD on 11/07/2010 08:39:14 AM

## 2010-11-08 NOTE — Discharge Summary (Signed)
  [name], [name]                 ACCOUNT NO.:  [id number]  MEDICAL RECORD NO.:  [id number]  LOCATION:  1505                         FACILITY:  [facility]  PHYSICIAN:  Kathlen Mody, MD       DATE OF BIRTH:  1960-06-29  DATE OF ADMISSION:  10/21/2010 DATE OF DISCHARGE:  10/24/2010                              DISCHARGE SUMMARY   DISCHARGE DIAGNOSES: 1. Psychosis. 2. History of human immunodeficiency virus. 3. Asthma. 4. Paranoid schizophrenia. 5. History of seizure disorder. 6. History of pulmonary nodule. 7. Hyponatremia. 8. Dehydration.  DISCHARGE MEDICATIONS: 1. Acetaminophen 650 mg as needed. 2. Calcium 1 tablet every 6 hours as needed. 3. Risperidone 2 mg 2 tablets at bedtime. 4. Benztropine 1 mg twice a day. 5. Carbamazepine 200 mg 1 tablet daily and 2 tablets at bedtime. 6. Hydrocortisone cream as needed. 7. Isentress 1 tablet b.i.d. 8. Kaletra 2 tablets twice a day. 9. Risperidone 4 mg at bed time. 10.Risperidone injection 25 mg intramuscularly once every 2 weeks. 11.Truvada 1 tablet daily. 12.Albuterol inhaler 2 to 4 puffs as needed.  LABORATORY DATA:  On admission, the patient's CBC was within normal limits.  Urine drug screen negative.  Comprehensive metabolic panel significant for a sodium of 121, chloride of 86.  Alcohol level was less than 11.  Urinalysis shows small leukocytes with a rare bacteria.  Urine osmolarity was 104.  Serum osmolarity was 273.  Pro BNP was 240.  On the day of discharge, comprehensive metabolic panel showed a sodium of 136, mag of 2.1.  TSH was within normal limits.  RADIOLOGY:  None.  BRIEF HOSPITAL COURSE:  This is a 50 year old lady with history of paranoid schizophrenia and was admitted for psychosis and on admission, she was also found to be hyponatremic.  She was worked up for hyponatremia.  SIADH was ruled out.  Hyponatremia most likely secondary to dehydration.  She was given IV fluids and hyponatremia has  resolved.  Psychosis.  Psychiatric consult was called.  Her medications were changed.  HIV.  She was continued with her Kaletra and Isentress.  Asthma, stable.  The patient's vital signs on the day of discharge include temperature of 98.2, pulse of 78, respirations 18, blood pressure 160/70, saturating 99% on room air.  Her exam was within normal limits.  FOLLOWUP:  She was recommended to follow up with her PCP/psychiatrist as needed in about 1 to 2 weeks.          ______________________________ Kathlen Mody, MD     VA/MEDQ  D:  11/07/2010  T:  11/07/2010  Job:  [id number]  Electronically Signed by Kathlen Mody MD on 11/08/2010 01:11:05 AM

## 2011-01-10 LAB — URINALYSIS, ROUTINE W REFLEX MICROSCOPIC
Hgb urine dipstick: NEGATIVE
Nitrite: NEGATIVE
Protein, ur: NEGATIVE
Specific Gravity, Urine: 1.017
Urobilinogen, UA: 0.2

## 2011-01-10 LAB — PROTEIN AND GLUCOSE, CSF
Glucose, CSF: 66
Total  Protein, CSF: 27

## 2011-01-10 LAB — BASIC METABOLIC PANEL
BUN: 8
CO2: 22
CO2: 24
Calcium: 9
Calcium: 9.4
Chloride: 104
Chloride: 108
Creatinine, Ser: 0.77
Creatinine, Ser: 0.84
GFR calc Af Amer: >60
GFR calc Af Amer: >60
GFR calc non Af Amer: >60
Glucose, Bld: 74
Glucose, Bld: 78
Potassium: 4
Potassium: 4.4
Sodium: 135
Sodium: 135
Sodium: 136

## 2011-01-10 LAB — COMPREHENSIVE METABOLIC PANEL
AST: 24
Albumin: 3.3 — ABNORMAL LOW
Alkaline Phosphatase: 79
BUN: 12
Chloride: 101
GFR calc Af Amer: >60
Potassium: 3.2 — ABNORMAL LOW
Sodium: 131 — ABNORMAL LOW
Total Protein: 6.9

## 2011-01-10 LAB — DIFFERENTIAL
Basophils Absolute: 0
Basophils Relative: 1
Eosinophils Relative: 4
Eosinophils Relative: 6 — ABNORMAL HIGH
Lymphocytes Relative: 17
Lymphs Abs: 0.8
Lymphs Abs: 1
Monocytes Absolute: 0.4
Monocytes Absolute: 0.4
Monocytes Absolute: 0.5
Monocytes Relative: 10
Monocytes Relative: 6
Monocytes Relative: 9
Neutro Abs: 2.3
Neutro Abs: 3.3
Neutro Abs: 5.8
Neutrophils Relative %: 54

## 2011-01-10 LAB — CBC
HCT: 35.3 — ABNORMAL LOW
HCT: 36.7
HCT: 41
Hemoglobin: 11.9 — ABNORMAL LOW
Hemoglobin: 13.1
Hemoglobin: 13.4
MCHC: 32.7
MCV: 96.4
MCV: 97.9
Platelets: 271
RBC: 3.67 — ABNORMAL LOW
RBC: 4.04
RDW: 13.1
RDW: 13.3
WBC: 4.3
WBC: 4.8
WBC: 7.7

## 2011-01-10 LAB — RAPID URINE DRUG SCREEN, HOSP PERFORMED
Amphetamines: NOT DETECTED
Benzodiazepines: NOT DETECTED
Cocaine: NOT DETECTED
Tetrahydrocannabinol: NOT DETECTED

## 2011-01-10 LAB — ETHANOL: Alcohol, Ethyl (B): <5

## 2011-01-10 LAB — CSF CELL COUNT WITH DIFFERENTIAL
Eosinophils, CSF: 0
Eosinophils, CSF: 0
Other Cells, CSF: 0
Other Cells, CSF: 0
RBC Count, CSF: 1 — ABNORMAL HIGH
Tube #: 1
Tube #: 4
WBC, CSF: 2

## 2011-01-10 LAB — CSF CULTURE W GRAM STAIN: Culture: NO GROWTH

## 2011-01-10 LAB — CARDIAC PANEL(CRET KIN+CKTOT+MB+TROPI)
CK, MB: 10 — ABNORMAL HIGH
CK, MB: 10.9 — ABNORMAL HIGH
CK, MB: 8.8 — ABNORMAL HIGH
Relative Index: 2.1
Relative Index: 2.7 — ABNORMAL HIGH
Troponin I: 0.03

## 2011-01-10 LAB — CK TOTAL AND CKMB (NOT AT ARMC)
CK, MB: 13.9 — ABNORMAL HIGH
Relative Index: 3.1 — ABNORMAL HIGH
Total CK: 451 — ABNORMAL HIGH

## 2011-01-10 LAB — URINE CULTURE

## 2011-01-10 LAB — CRYPTOCOCCAL ANTIGEN, CSF: Crypto Ag: NEGATIVE

## 2011-01-10 LAB — GRAM STAIN

## 2011-01-10 LAB — PROTIME-INR: INR: 1

## 2011-01-10 LAB — APTT: aPTT: 30

## 2011-01-10 LAB — T-HELPER CELLS (CD4) COUNT (NOT AT ARMC): CD4 % Helper T Cell: 17 — ABNORMAL LOW

## 2011-01-10 LAB — TROPONIN I: Troponin I: 0.05

## 2011-01-10 LAB — CYTOMEGALOVIRUS PCR, QUALITATIVE: Cytomegalovirus DNA: NOT DETECTED

## 2011-02-13 ENCOUNTER — Other Ambulatory Visit (INDEPENDENT_AMBULATORY_CARE_PROVIDER_SITE_OTHER): Payer: Medicaid Other

## 2011-02-13 ENCOUNTER — Other Ambulatory Visit: Payer: Self-pay | Admitting: Infectious Diseases

## 2011-02-13 DIAGNOSIS — Z79899 Other long term (current) drug therapy: Secondary | ICD-10-CM

## 2011-02-13 DIAGNOSIS — B20 Human immunodeficiency virus [HIV] disease: Secondary | ICD-10-CM

## 2011-02-13 LAB — CBC WITH DIFFERENTIAL/PLATELET
Eosinophils Relative: 3 % (ref 0–5)
HCT: 43.5 % (ref 36.0–46.0)
Lymphocytes Relative: 38 % (ref 12–46)
Lymphs Abs: 1.8 10*3/uL (ref 0.7–4.0)
MCV: 88.8 fL (ref 78.0–100.0)
Monocytes Absolute: 0.4 10*3/uL (ref 0.1–1.0)
Neutro Abs: 2.3 10*3/uL (ref 1.7–7.7)

## 2011-02-13 LAB — LIPID PANEL
HDL: 63 mg/dL (ref >39)
LDL Cholesterol: 99 mg/dL (ref 0–99)
Total CHOL/HDL Ratio: 2.9 Ratio

## 2011-02-13 LAB — COMPLETE METABOLIC PANEL WITH GFR
ALT: 30 U/L (ref 0–35)
Albumin: 4.3 g/dL (ref 3.5–5.2)
Alkaline Phosphatase: 101 U/L (ref 39–117)
CO2: 24 mEq/L (ref 19–32)
GFR, Est Non African American: 71 mL/min — ABNORMAL LOW (ref >89)
Glucose, Bld: 77 mg/dL (ref 70–99)
Potassium: 4.4 mEq/L (ref 3.5–5.3)
Sodium: 135 mEq/L (ref 135–145)
Total Bilirubin: 0.4 mg/dL (ref 0.3–1.2)
Total Protein: 7.1 g/dL (ref 6.0–8.3)

## 2011-02-14 LAB — T-HELPER CELL (CD4) - (RCID CLINIC ONLY)
CD4 % Helper T Cell: 27 % — ABNORMAL LOW (ref 33–55)
CD4 T Cell Abs: 480 uL (ref 400–2700)

## 2011-02-15 LAB — HIV-1 RNA QUANT-NO REFLEX-BLD: HIV-1 RNA Quant, Log: <1.3 {Log} (ref <1.30)

## 2011-02-27 ENCOUNTER — Ambulatory Visit: Payer: Medicaid Other | Admitting: Adult Health

## 2011-02-27 ENCOUNTER — Ambulatory Visit (INDEPENDENT_AMBULATORY_CARE_PROVIDER_SITE_OTHER): Payer: Medicaid Other | Admitting: Internal Medicine

## 2011-02-27 ENCOUNTER — Encounter: Payer: Self-pay | Admitting: Internal Medicine

## 2011-02-27 DIAGNOSIS — Z21 Asymptomatic human immunodeficiency virus [HIV] infection status: Secondary | ICD-10-CM

## 2011-02-27 DIAGNOSIS — B20 Human immunodeficiency virus [HIV] disease: Secondary | ICD-10-CM

## 2011-02-27 DIAGNOSIS — Z113 Encounter for screening for infections with a predominantly sexual mode of transmission: Secondary | ICD-10-CM

## 2011-02-27 DIAGNOSIS — Z79899 Other long term (current) drug therapy: Secondary | ICD-10-CM

## 2011-02-27 DIAGNOSIS — Z23 Encounter for immunization: Secondary | ICD-10-CM

## 2011-02-27 NOTE — Assessment & Plan Note (Signed)
Patient is doing well on her current regimen and is undetectable. I did discuss condom use with sexual activity if she again does initiate sexual activity. She does have a history of schizophrenia and does have pressured speech but she is able to tell me that she has HIV and how important condom uses. She does tolerate her medicines well and no changes will be made. She will followup in 6 months time and was told to call if she has any problems sooner than that.

## 2011-02-27 NOTE — Progress Notes (Signed)
  Subjective:    Patient ID: [name], female    DOB: 1960/06/29, 50 y.o.   MRN: [id number]  HPI this patient comes in for routine followup.she has been on a regimen of Truvada, Kaletra and I Centrex and has been tolerating it well. She tells me that she does not miss doses except for one time when she hit her medicine from the nurses at the facility where she stays though they did find it and she again did start her medications. She otherwise is not currently sexually active and has no particular complaints. Not had any weight loss and denies any dysphasia, dysuria or diarrhea. She does get her Pap smear from her primary physician and has had one within the last few months.    Review of Systems  Constitutional: Negative for fever, chills, activity change and fatigue.  HENT: Negative for trouble swallowing.   Cardiovascular: Negative for leg swelling.  Gastrointestinal: Negative for nausea, diarrhea and constipation.  Genitourinary: Negative for dysuria and frequency.  Musculoskeletal: Negative for myalgias and arthralgias.  Skin: Negative for rash.  Neurological: Negative for headaches.  Hematological: Negative for adenopathy.  Psychiatric/Behavioral: Negative for dysphoric mood.       Objective:   Physical Exam  Constitutional: She appears well-developed and well-nourished. No distress.  HENT:  Mouth/Throat: Oropharynx is clear and moist. No oropharyngeal exudate.  Cardiovascular: Normal rate, regular rhythm and normal heart sounds.   No murmur heard. Pulmonary/Chest: Effort normal and breath sounds normal. She has no wheezes. She has no rales.  Abdominal: Soft. Bowel sounds are normal. She exhibits no distension. There is no tenderness.  Lymphadenopathy:    She has no cervical adenopathy.  Neurological: She is alert.  Skin: Skin is warm and dry. No erythema.  Psychiatric:       Hypomanic with pressured speech but redirectable.           Assessment & Plan:

## 2011-02-27 NOTE — Patient Instructions (Signed)
Fast for next set of labs in 5 months

## 2011-02-28 ENCOUNTER — Ambulatory Visit: Payer: Medicaid Other | Admitting: Internal Medicine

## 2011-03-01 LAB — TB SKIN TEST

## 2011-04-05 ENCOUNTER — Other Ambulatory Visit: Payer: Self-pay | Admitting: Adult Health

## 2011-07-10 ENCOUNTER — Other Ambulatory Visit: Payer: Self-pay | Admitting: *Deleted

## 2011-07-10 MED ORDER — ALBUTEROL SULFATE HFA 108 (90 BASE) MCG/ACT IN AERS: 2.0000 | INHALATION_SPRAY | Freq: Four times a day (QID) | RESPIRATORY_TRACT | Status: DC | PRN

## 2011-07-17 ENCOUNTER — Other Ambulatory Visit: Payer: Self-pay | Admitting: Internal Medicine

## 2011-07-17 DIAGNOSIS — Z113 Encounter for screening for infections with a predominantly sexual mode of transmission: Secondary | ICD-10-CM

## 2011-08-14 ENCOUNTER — Other Ambulatory Visit: Payer: Medicaid Other

## 2011-08-15 ENCOUNTER — Other Ambulatory Visit (HOSPITAL_COMMUNITY)
Admission: RE | Admit: 2011-08-15 | Discharge: 2011-08-15 | Disposition: A | Discharge status: Home / Self Care | Payer: Medicaid Other | Source: Ambulatory Visit | Attending: Infectious Diseases | Admitting: Infectious Diseases

## 2011-08-15 ENCOUNTER — Other Ambulatory Visit: Payer: Medicaid Other

## 2011-08-15 DIAGNOSIS — Z113 Encounter for screening for infections with a predominantly sexual mode of transmission: Secondary | ICD-10-CM

## 2011-08-15 DIAGNOSIS — B20 Human immunodeficiency virus [HIV] disease: Secondary | ICD-10-CM

## 2011-08-15 DIAGNOSIS — Z79899 Other long term (current) drug therapy: Secondary | ICD-10-CM

## 2011-08-15 LAB — COMPLETE METABOLIC PANEL WITH GFR
AST: 28 U/L (ref 0–37)
Albumin: 4.2 g/dL (ref 3.5–5.2)
Alkaline Phosphatase: 117 U/L (ref 39–117)
Calcium: 9.3 mg/dL (ref 8.4–10.5)
Chloride: 97 mEq/L (ref 96–112)
Potassium: 3.7 mEq/L (ref 3.5–5.3)
Sodium: 136 mEq/L (ref 135–145)
Total Protein: 7.3 g/dL (ref 6.0–8.3)

## 2011-08-15 LAB — CBC WITH DIFFERENTIAL/PLATELET
Eosinophils Absolute: 0.1 10*3/uL (ref 0.0–0.7)
Eosinophils Relative: 1 % (ref 0–5)
HCT: 45.4 % (ref 36.0–46.0)
Hemoglobin: 15.7 g/dL — ABNORMAL HIGH (ref 12.0–15.0)
Lymphs Abs: 1.6 10*3/uL (ref 0.7–4.0)
MCH: 31.5 pg (ref 26.0–34.0)
MCV: 91 fL (ref 78.0–100.0)
Monocytes Absolute: 0.5 10*3/uL (ref 0.1–1.0)
Monocytes Relative: 11 % (ref 3–12)
Platelets: 257 10*3/uL (ref 150–400)
RBC: 4.99 MIL/uL (ref 3.87–5.11)

## 2011-08-15 LAB — LIPID PANEL
HDL: 51 mg/dL (ref >39)
LDL Cholesterol: 84 mg/dL (ref 0–99)
Total CHOL/HDL Ratio: 3.1 Ratio
VLDL: 23 mg/dL (ref 0–40)

## 2011-08-16 LAB — T-HELPER CELL (CD4) - (RCID CLINIC ONLY): CD4 T Cell Abs: 460 uL (ref 400–2700)

## 2011-08-17 LAB — HIV-1 RNA QUANT-NO REFLEX-BLD: HIV 1 RNA Quant: 67 copies/mL — ABNORMAL HIGH (ref <20)

## 2011-08-28 ENCOUNTER — Ambulatory Visit: Payer: Medicaid Other | Admitting: Adult Health

## 2011-08-29 ENCOUNTER — Ambulatory Visit (INDEPENDENT_AMBULATORY_CARE_PROVIDER_SITE_OTHER): Payer: Medicaid Other | Admitting: Internal Medicine

## 2011-08-29 ENCOUNTER — Encounter: Payer: Self-pay | Admitting: Internal Medicine

## 2011-08-29 VITALS — BP 109/72 | HR 105 | Temp 98.5°F | Wt 129.0 lb

## 2011-08-29 DIAGNOSIS — J45901 Unspecified asthma with (acute) exacerbation: Secondary | ICD-10-CM

## 2011-08-29 MED ORDER — ALBUTEROL SULFATE (2.5 MG/3ML) 0.083% IN NEBU
2.5000 mg | INHALATION_SOLUTION | Freq: Once | RESPIRATORY_TRACT | Status: AC
  Administered 2011-08-29: 2.5 mg via RESPIRATORY_TRACT

## 2011-08-29 MED ORDER — ALBUTEROL SULFATE (2.5 MG/3ML) 0.083% IN NEBU: 2.5000 mg | INHALATION_SOLUTION | Freq: Four times a day (QID) | RESPIRATORY_TRACT | Status: DC | PRN

## 2011-08-29 MED ORDER — PREDNISONE (PAK) 10 MG PO TABS: 10.0000 mg | ORAL_TABLET | Freq: Every day | ORAL | Status: AC

## 2011-08-29 MED ORDER — PREDNISONE (PAK) 10 MG PO TABS: 10.0000 mg | ORAL_TABLET | Freq: Every day | ORAL | Status: DC

## 2011-09-07 ENCOUNTER — Encounter: Payer: Self-pay | Admitting: Internal Medicine

## 2011-09-07 ENCOUNTER — Telehealth: Payer: Self-pay | Admitting: *Deleted

## 2011-09-07 ENCOUNTER — Ambulatory Visit (INDEPENDENT_AMBULATORY_CARE_PROVIDER_SITE_OTHER): Payer: Medicaid Other | Admitting: Internal Medicine

## 2011-09-07 VITALS — BP 86/58 | HR 67 | Temp 97.7°F | Ht 59.0 in | Wt 121.0 lb

## 2011-09-07 DIAGNOSIS — R197 Diarrhea, unspecified: Secondary | ICD-10-CM

## 2011-09-07 DIAGNOSIS — Z23 Encounter for immunization: Secondary | ICD-10-CM

## 2011-09-07 NOTE — Telephone Encounter (Signed)
Lequita Halt at [employer] called to see if we received the fax for this patient medical necessity for her home nebulizer. After checking with the provider not able to find inhouse. Aquilla Hacker to resend the forms to my attention and I will give to the provider.

## 2011-10-09 ENCOUNTER — Ambulatory Visit: Payer: Medicaid Other | Admitting: Internal Medicine

## 2011-10-10 ENCOUNTER — Encounter: Payer: Self-pay | Admitting: Internal Medicine

## 2011-10-10 ENCOUNTER — Ambulatory Visit (INDEPENDENT_AMBULATORY_CARE_PROVIDER_SITE_OTHER): Payer: Medicaid Other | Admitting: Internal Medicine

## 2011-10-10 VITALS — BP 125/75 | HR 91 | Temp 98.4°F | Wt 124.0 lb

## 2011-10-10 DIAGNOSIS — B20 Human immunodeficiency virus [HIV] disease: Secondary | ICD-10-CM

## 2011-10-10 DIAGNOSIS — Z21 Asymptomatic human immunodeficiency virus [HIV] infection status: Secondary | ICD-10-CM

## 2011-10-10 DIAGNOSIS — Z23 Encounter for immunization: Secondary | ICD-10-CM

## 2011-10-10 NOTE — Progress Notes (Signed)
HIV CLINIC VISIT RFV: routine follow up Subjective:    Patient ID: [name], female    DOB: July 07, 1960, 51 y.o.   MRN: [id number]  HPI 51yo F with schizophrenia, COPD/Asthma, HIV, CD 4 count of 460(30)/VL 67 (may 2013) on raltegravir, [name] presents for follow up. She states that she has been doing well with HIV meds. Has not missed a dose. During her last visit in may she was treated from COPD, asthma exacerbation. Possibly would also explain slightly detectable VL in setting of illness. She states from the breathing perspective that she is doing well. She is using her albuterol MDI as well as 2 albuterol nebs a day. She is doing nebs on her own which is an Neurosurgeon. She did also mentions that she had a brief episode of diarrhea that is now resolved thought to be due to coffee consumption. She is down to 1 cup per day.  She is still asignificant smoker, 1 ppd. " i know that i need to quit"  Current Outpatient Prescriptions on File Prior to Visit  Medication Sig Dispense Refill  . albuterol (PROVENTIL HFA;VENTOLIN HFA) 108 (90 BASE) MCG/ACT inhaler Inhale 2 puffs into the lungs every 6 (six) hours as needed for wheezing.  1 Inhaler  6  . albuterol (PROVENTIL) (2.5 MG/3ML) 0.083% nebulizer solution Take 2.5 mg by nebulization every 6 (six) hours as needed.        Marland Kitchen albuterol (PROVENTIL) (2.5 MG/3ML) 0.083% nebulizer solution Take 3 mLs (2.5 mg total) by nebulization every 6 (six) hours as needed for wheezing.  75 mL  12  . benztropine (COGENTIN) 1 MG tablet Take 1 mg by mouth 2 (two) times daily.        . carbamazepine (TEGRETOL) 200 MG tablet Take 200 mg by mouth 3 (three) times daily.        . ISENTRESS 400 MG tablet TAKE 1 TABLET BY MOUTH 2 TIMES DAILY.  60 each  6  . KALETRA 200-50 MG per tablet TAKE 2 TABLETS BY MOUTH 2 TIMES DAILY.  120 each  6  . ketoconazole (NIZORAL) 2 % cream Apply topically.        . risperiDONE (RISPERDAL) 2 MG tablet Take 2 mg by mouth daily.        .  risperidone (RISPERDAL) 4 MG tablet Take 4 mg by mouth at bedtime.        . risperiDONE microspheres (RISPERDAL CONSTA) 50 MG injection Inject 50 mg into the muscle every 14 (fourteen) days.        . TRUVADA 200-300 MG per tablet TAKE 1 TABLET BY MOUTH DAILY.  30 each  6  . DISCONTD: beclomethasone (QVAR) 40 MCG/ACT inhaler Inhale 2 puffs into the lungs 2 (two) times daily.  1 Inhaler  12   Active Ambulatory Problems    Diagnosis Date Noted  . Asthma 08/18/2010  . HIV (human immunodeficiency virus infection) 08/18/2010  . Schizophrenia 08/18/2010  . Herpes simplex infection 08/18/2010   Resolved Ambulatory Problems    Diagnosis Date Noted  . No Resolved Ambulatory Problems   Past Medical History  Diagnosis Date  . GERD (gastroesophageal reflux disease)   . AIDS 12-10-2007  . Herpes simplex   . History of cholecystectomy   . Cataract   . Nonspecific reaction to tuberculin skin test without active tuberculosis 11-2008 [facility]   . Asthma   . Seizures     Review of Systems 10 point ROS is otherwise negative    Objective:  Physical Exam Gen= awake, in NAD HEENT= edentulous, op clear, no thrush Neck= supple, no lad Pulm= CTAB, no w/c/r Cors= nl s1,s2, no g/m/r  ext= no c/c/e       Assessment & Plan:   1) HIV = continue ral/kaletra 2) hep B core ab + = will get 2nd HBV vax today 3) smoking cessation = outlined a plan for the patient to decrease to 3/4 ppd at her next appt 4) health maintenance = will need to schedule mammo at next visit  rtc in 3 months

## 2011-10-20 ENCOUNTER — Other Ambulatory Visit: Payer: Self-pay | Admitting: Infectious Diseases

## 2011-12-26 ENCOUNTER — Other Ambulatory Visit: Payer: Self-pay | Admitting: Internal Medicine

## 2011-12-26 DIAGNOSIS — B20 Human immunodeficiency virus [HIV] disease: Secondary | ICD-10-CM

## 2012-01-01 ENCOUNTER — Other Ambulatory Visit: Payer: Medicaid Other

## 2012-01-01 DIAGNOSIS — R69 Illness, unspecified: Secondary | ICD-10-CM

## 2012-01-01 DIAGNOSIS — B20 Human immunodeficiency virus [HIV] disease: Secondary | ICD-10-CM

## 2012-01-01 LAB — COMPREHENSIVE METABOLIC PANEL
ALT: 16 U/L (ref 0–35)
AST: 21 U/L (ref 0–37)
Albumin: 4.1 g/dL (ref 3.5–5.2)
Calcium: 9.7 mg/dL (ref 8.4–10.5)
Chloride: 100 mEq/L (ref 96–112)
Potassium: 4.5 mEq/L (ref 3.5–5.3)

## 2012-01-08 NOTE — Progress Notes (Signed)
Subjective:    Patient ID: [name], female    DOB: 02/16/1961, 51 y.o.   MRN: [id number]  HPI 51yo Female with HIV, CD 4 count460, VL 67, on raltegravir, truvada, LPV/r, previously undetectable, last seen 2 wks ago when she presented with COPD/Asthma exacerbation likely from viral illness. She was prescribed steroid taper, and given rx for albuterol nebulizer and machine. She states that she is doing much better. Denies cough or dyspnea on exertion. No wheezing when resting anymore.  She states that she gets occasionally dizzy when she stands up too quickly.  No fever, chills, nightsweats  Current Outpatient Prescriptions on File Prior to Visit  Medication Sig Dispense Refill  . albuterol (PROVENTIL HFA;VENTOLIN HFA) 108 (90 BASE) MCG/ACT inhaler Inhale 2 puffs into the lungs every 6 (six) hours as needed for wheezing.  1 Inhaler  6  . albuterol (PROVENTIL) (2.5 MG/3ML) 0.083% nebulizer solution Take 2.5 mg by nebulization every 6 (six) hours as needed.        Marland Kitchen albuterol (PROVENTIL) (2.5 MG/3ML) 0.083% nebulizer solution Take 3 mLs (2.5 mg total) by nebulization every 6 (six) hours as needed for wheezing.  75 mL  12  . benztropine (COGENTIN) 1 MG tablet Take 1 mg by mouth 2 (two) times daily.        . carbamazepine (TEGRETOL) 200 MG tablet Take 200 mg by mouth 3 (three) times daily.        Marland Kitchen ketoconazole (NIZORAL) 2 % cream Apply topically.        . risperiDONE (RISPERDAL) 2 MG tablet Take 2 mg by mouth daily.        . risperidone (RISPERDAL) 4 MG tablet Take 4 mg by mouth at bedtime.        . risperiDONE microspheres (RISPERDAL CONSTA) 50 MG injection Inject 50 mg into the muscle every 14 (fourteen) days.        . beclomethasone (QVAR) 40 MCG/ACT inhaler Inhale 2 puffs into the lungs 2 (two) times daily.       Active Ambulatory Problems    Diagnosis Date Noted  . Asthma 08/18/2010  . HIV (human immunodeficiency virus infection) 08/18/2010  . Schizophrenia 08/18/2010  . Herpes simplex  infection 08/18/2010   Resolved Ambulatory Problems    Diagnosis Date Noted  . No Resolved Ambulatory Problems   Past Medical History  Diagnosis Date  . GERD (gastroesophageal reflux disease)   . AIDS 12-10-2007  . Herpes simplex   . History of cholecystectomy   . Cataract   . Nonspecific reaction to tuberculin skin test without active tuberculosis 11-2008 [facility]   . Asthma   . Seizures    History  Substance Use Topics  . Smoking status: Current Every Day Smoker -- 3.0 packs/day for 35 years    Types: Cigarettes  . Smokeless tobacco: Never Used  . Alcohol Use: No    Review of Systems 10 point ROS reviewed, otherwise negative except what is mentioned in HPI    Objective:   Physical Exam  BP 86/58  Pulse 67  Temp 97.7 F (36.5 C) (Oral)  Ht 4\' 11"  (1.499 m)  Wt 121 lb (54.885 kg)  BMI 24.44 kg/m2 Physical Exam  Constitutional:  oriented to person, place, and time.  well-developed and well-nourished. No distress.  HENT:  Mouth/Throat: Oropharynx is clear and moist. No oropharyngeal exudate.  Cardiovascular: Normal rate, regular rhythm and normal heart sounds. Exam reveals no gallop and no friction rub.  No murmur heard.  Pulmonary/Chest:  Effort normal and breath sounds normal. No tachypnea, mild expiratory wheeze at bases bilaterally, No respiratory distress.  Lymphadenopathy:  no cervical adenopathy.  Skin: Skin is warm and dry. No rash noted. No erythema.        Assessment & Plan:  hiv = continue with current regimen, reminder for importance of adherence. May have small blip. Possibly due to viral illness. Will repeat labs in 3 months  Asthma exacerbation = has finished her pred taper. Recommend to continue with albuterol as needed.   Smoking cessation = discussed that she should attempt to decrease/stop. Pre-contemplative stage  rtc in 3 month

## 2012-01-08 NOTE — Progress Notes (Signed)
HIV CLINIC NOTE  RFV: routine visit, but reports shortness of breath x 2 wks. Subjective:    Patient ID: [name], female    DOB: 1960/12/27, 51 y.o.   MRN: [id number]  HPI [name] is a 51yo F with HIV, schizophrenia, CD 4 count of 460(30%)/ VL 67, currently on raltegravir, truvada, and kaletra. She states that she is taking her medications regularly. Missed 1 dose in the past 3 months. She mentions that she is short of breath, hears wheezing at rest. X 2 wks. Take albuterol inhaler but not helping as much. She states that she also had a cough the preceeded her symptoms but now has loud dry cough in addition to wheezing.  No fever, chills, nightsweats, nor productive cough. Pulse ox 90% at rest. Gave albuterol nebs in clinic, improved sats to 92%.  Current Outpatient Prescriptions on File Prior to Visit  Medication Sig Dispense Refill  . albuterol (PROVENTIL HFA;VENTOLIN HFA) 108 (90 BASE) MCG/ACT inhaler Inhale 2 puffs into the lungs every 6 (six) hours as needed for wheezing.  1 Inhaler  6  . albuterol (PROVENTIL) (2.5 MG/3ML) 0.083% nebulizer solution Take 2.5 mg by nebulization every 6 (six) hours as needed.        . benztropine (COGENTIN) 1 MG tablet Take 1 mg by mouth 2 (two) times daily.        . carbamazepine (TEGRETOL) 200 MG tablet Take 200 mg by mouth 3 (three) times daily.        Marland Kitchen ketoconazole (NIZORAL) 2 % cream Apply topically.        . risperiDONE (RISPERDAL) 2 MG tablet Take 2 mg by mouth daily.        . risperidone (RISPERDAL) 4 MG tablet Take 4 mg by mouth at bedtime.        . risperiDONE microspheres (RISPERDAL CONSTA) 50 MG injection Inject 50 mg into the muscle every 14 (fourteen) days.        . beclomethasone (QVAR) 40 MCG/ACT inhaler Inhale 2 puffs into the lungs 2 (two) times daily.       Active Ambulatory Problems    Diagnosis Date Noted  . Asthma 08/18/2010  . HIV (human immunodeficiency virus infection) 08/18/2010  . Schizophrenia 08/18/2010  . Herpes simplex  infection 08/18/2010   Resolved Ambulatory Problems    Diagnosis Date Noted  . No Resolved Ambulatory Problems   Past Medical History  Diagnosis Date  . GERD (gastroesophageal reflux disease)   . AIDS 12-10-2007  . Herpes simplex   . History of cholecystectomy   . Cataract   . Nonspecific reaction to tuberculin skin test without active tuberculosis 11-2008 [facility]   . Asthma   . Seizures        Review of Systems Per hpi    Objective:   Physical Exam BP 109/72  Pulse 105  Temp 98.5 F (36.9 C) (Oral)  Wt 129 lb (58.514 kg)  SpO2 90% Gen= in NAD, but has loud nonproductive cough ,and audible wheezing. HEENt= moist mucous membranes, poor dentition, no thrush Neck= supple, no lad Pulm= diffuse wheezing, inspiratory and exporatory throughout. No acc muscle use Ext= no c/c/e Skin= warm ,dry, no rash       Assessment & Plan:  Asthma exacerbation = will place patient on pred taper of 40mg  x 3d, 30mg  x 3d, 20mg  x 3 d, 10mg  x 3 d. In addition to giving rx for albuterol nebs and machine.  hiv = continue with current regimen, will repeat labs  in 3 months to see if she has persistent low level viremia  Health maintenance = will need flu shot in the fall  rtc in 2 wk to follow up on her current illness

## 2012-01-09 ENCOUNTER — Other Ambulatory Visit (INDEPENDENT_AMBULATORY_CARE_PROVIDER_SITE_OTHER): Payer: Medicaid Other

## 2012-01-09 DIAGNOSIS — B20 Human immunodeficiency virus [HIV] disease: Secondary | ICD-10-CM

## 2012-01-09 LAB — CBC WITH DIFFERENTIAL/PLATELET
Basophils Relative: 1 % (ref 0–1)
Eosinophils Absolute: 0.4 10*3/uL (ref 0.0–0.7)
HCT: 42.6 % (ref 36.0–46.0)
Hemoglobin: 14.5 g/dL (ref 12.0–15.0)
MCH: 30.5 pg (ref 26.0–34.0)
MCHC: 34 g/dL (ref 30.0–36.0)
Monocytes Absolute: 0.4 10*3/uL (ref 0.1–1.0)
Monocytes Relative: 8 % (ref 3–12)
Neutro Abs: 1.8 10*3/uL (ref 1.7–7.7)

## 2012-01-10 LAB — T-HELPER CELL (CD4) - (RCID CLINIC ONLY): CD4 % Helper T Cell: 26 % — ABNORMAL LOW (ref 33–55)

## 2012-01-16 ENCOUNTER — Ambulatory Visit (INDEPENDENT_AMBULATORY_CARE_PROVIDER_SITE_OTHER): Payer: Medicaid Other | Admitting: Internal Medicine

## 2012-01-16 ENCOUNTER — Encounter: Payer: Self-pay | Admitting: Internal Medicine

## 2012-01-16 VITALS — BP 134/75 | HR 82 | Temp 97.3°F | Wt 130.0 lb

## 2012-01-16 DIAGNOSIS — J449 Chronic obstructive pulmonary disease, unspecified: Secondary | ICD-10-CM

## 2012-01-16 DIAGNOSIS — Z23 Encounter for immunization: Secondary | ICD-10-CM

## 2012-01-16 MED ORDER — GUAIFENESIN-DM 100-10 MG/5ML PO SYRP: 5.0000 mL | ORAL_SOLUTION | Freq: Three times a day (TID) | ORAL | Status: DC | PRN

## 2012-01-16 NOTE — Progress Notes (Signed)
HIV CLINIC NOTE   RVF: routine visit Subjective:    Patient ID: [name], female    DOB: [birth year], 51 y.o.   MRN: [id number]  HPI:  51yo F with schizophrenia, COPD/Asthma, HIV, CD 4 count of 600/VL <20 (oct 2013) on raltegravir, [name] presents for follow-up. Reports missing a dose. During her last visit in may she was treated from COPD, asthma exacerbation. Possibly would also explain slightly detectable VL in setting of illness. She states from the breathing perspective that she is doing well. She is using her albuterol MDI as well as 2 albuterol nebs a day. She is doing nebs on her own which is an Neurosurgeon. She states diarrhea is getting better, since less coffee consumption  She is still smoking  1 ppd. " i know that i need to quit"  Social hx: works as a Radiographer, therapeutic. Mom recently diagnosed with lung cancer. No sex. 1ppd  Current Outpatient Prescriptions on File Prior to Visit  Medication Sig Dispense Refill  . albuterol (PROVENTIL HFA;VENTOLIN HFA) 108 (90 BASE) MCG/ACT inhaler Inhale 2 puffs into the lungs every 6 (six) hours as needed for wheezing.  1 Inhaler  6  . albuterol (PROVENTIL) (2.5 MG/3ML) 0.083% nebulizer solution Take 2.5 mg by nebulization every 6 (six) hours as needed.        Marland Kitchen albuterol (PROVENTIL) (2.5 MG/3ML) 0.083% nebulizer solution Take 3 mLs (2.5 mg total) by nebulization every 6 (six) hours as needed for wheezing.  75 mL  12  . beclomethasone (QVAR) 40 MCG/ACT inhaler Inhale 2 puffs into the lungs 2 (two) times daily.      . benztropine (COGENTIN) 1 MG tablet Take 1 mg by mouth 2 (two) times daily.        . carbamazepine (TEGRETOL) 200 MG tablet Take 200 mg by mouth 3 (three) times daily.        . ISENTRESS 400 MG tablet TAKE 1 TABLET BY MOUTH 2 TIMES DAILY.  60 each  6  . KALETRA 200-50 MG per tablet TAKE 2 TABLETS BY MOUTH 2 TIMES DAILY.  120 each  6  . ketoconazole (NIZORAL) 2 % cream Apply topically.        . risperiDONE (RISPERDAL) 2 MG  tablet Take 2 mg by mouth daily.        . risperidone (RISPERDAL) 4 MG tablet Take 4 mg by mouth at bedtime.        . risperiDONE microspheres (RISPERDAL CONSTA) 50 MG injection Inject 50 mg into the muscle every 14 (fourteen) days.        . TRUVADA 200-300 MG per tablet TAKE 1 TABLET BY MOUTH DAILY.  30 each  6    Review of Systems Review of Systems  Constitutional: Negative for fever, chills, diaphoresis, activity change, appetite change, fatigue and unexpected weight change.  HENT: Negative for congestion, sore throat, rhinorrhea, sneezing, trouble swallowing and sinus pressure.  Eyes: Negative for photophobia and visual disturbance.  Respiratory: Negative for cough, chest tightness, shortness of breath, wheezing and stridor.  Cardiovascular: Negative for chest pain, palpitations and leg swelling.  Gastrointestinal: Negative for nausea, vomiting, abdominal pain, diarrhea, constipation, blood in stool, abdominal distention and anal bleeding.  Genitourinary: Negative for dysuria, hematuria, flank pain and difficulty urinating.  Musculoskeletal: Negative for myalgias, back pain, joint swelling, arthralgias and gait problem.  Skin: Negative for color change, pallor, rash and wound.  Neurological: Negative for dizziness, tremors, weakness and light-headedness.  Hematological: Negative for adenopathy. Does not bruise/bleed  easily.  Psychiatric/Behavioral: Negative for behavioral problems, confusion, sleep disturbance, dysphoric mood, decreased concentration and agitation.       Objective:   Physical Exam  BP 134/75  Pulse 82  Temp 97.3 F (36.3 C) (Oral)  Wt 130 lb (58.968 kg) Physical Exam  Constitutional: oriented to person, place, and time. appears well-developed and well-nourished. No distress.  HENT:  Mouth/Throat: Oropharynx is clear and moist. No oropharyngeal exudate.  Cardiovascular: Normal rate, regular rhythm and normal heart sounds. Exam reveals no gallop and no friction  rub.  No murmur heard.  Pulmonary/Chest: Effort normal and breath sounds normal. No respiratory distress.  no wheezes.  Abdominal: Soft. Bowel sounds are normal.  exhibits no distension. There is no tenderness.  Lymphadenopathy:  no cervical adenopathy.  Skin: Skin is warm and dry. No rash noted. No erythema.        Assessment & Plan:  hiv = doing great on hiv medications; will continue on raltegravir/kaletra  Schizophrenia = getting injection every 2 wks via psychiatrist  Cough = thought to be related to COPD, will do a trial of robitussin DM, 1 tsp TID if needed for cough  Health maintenance= received flu vaccine today; has pap and mammo scheduled; last shot for hep B#3 should be early dec  rtc in 3months

## 2012-01-22 ENCOUNTER — Telehealth: Payer: Self-pay | Admitting: *Deleted

## 2012-01-22 NOTE — Telephone Encounter (Signed)
Patient's sister called requesting a referral to a podiatrist, she did not request this at her last office visit, 10.8.13.  She has [facility] infectious disease listed on her medicaid card so the referral would have to come from there.  Advised to call [facility] and have a new PCP assigned who can do the referral. Wendall Mola CMA

## 2012-02-19 ENCOUNTER — Ambulatory Visit (INDEPENDENT_AMBULATORY_CARE_PROVIDER_SITE_OTHER): Payer: Medicaid Other | Admitting: *Deleted

## 2012-02-19 DIAGNOSIS — Z23 Encounter for immunization: Secondary | ICD-10-CM

## 2012-02-19 DIAGNOSIS — B20 Human immunodeficiency virus [HIV] disease: Secondary | ICD-10-CM

## 2012-02-22 ENCOUNTER — Other Ambulatory Visit: Payer: Self-pay | Admitting: Internal Medicine

## 2012-02-26 ENCOUNTER — Emergency Department (HOSPITAL_COMMUNITY)
Admission: EM | Admit: 2012-02-26 | Discharge: 2012-02-27 | Disposition: A | Discharge status: Home Health | Payer: Medicaid Other | Attending: Emergency Medicine | Admitting: Emergency Medicine

## 2012-02-26 ENCOUNTER — Encounter (HOSPITAL_COMMUNITY): Payer: Self-pay

## 2012-02-26 DIAGNOSIS — R7611 Nonspecific reaction to tuberculin skin test without active tuberculosis: Secondary | ICD-10-CM | POA: Insufficient documentation

## 2012-02-26 DIAGNOSIS — J4489 Other specified chronic obstructive pulmonary disease: Secondary | ICD-10-CM | POA: Insufficient documentation

## 2012-02-26 DIAGNOSIS — Z79899 Other long term (current) drug therapy: Secondary | ICD-10-CM | POA: Insufficient documentation

## 2012-02-26 DIAGNOSIS — Z21 Asymptomatic human immunodeficiency virus [HIV] infection status: Secondary | ICD-10-CM | POA: Insufficient documentation

## 2012-02-26 DIAGNOSIS — G40909 Epilepsy, unspecified, not intractable, without status epilepticus: Secondary | ICD-10-CM | POA: Insufficient documentation

## 2012-02-26 DIAGNOSIS — F209 Schizophrenia, unspecified: Secondary | ICD-10-CM | POA: Insufficient documentation

## 2012-02-26 DIAGNOSIS — H259 Unspecified age-related cataract: Secondary | ICD-10-CM | POA: Insufficient documentation

## 2012-02-26 DIAGNOSIS — F17200 Nicotine dependence, unspecified, uncomplicated: Secondary | ICD-10-CM | POA: Insufficient documentation

## 2012-02-26 DIAGNOSIS — Z9089 Acquired absence of other organs: Secondary | ICD-10-CM | POA: Insufficient documentation

## 2012-02-26 DIAGNOSIS — J45909 Unspecified asthma, uncomplicated: Secondary | ICD-10-CM | POA: Insufficient documentation

## 2012-02-26 DIAGNOSIS — J449 Chronic obstructive pulmonary disease, unspecified: Secondary | ICD-10-CM

## 2012-02-26 DIAGNOSIS — Z8619 Personal history of other infectious and parasitic diseases: Secondary | ICD-10-CM | POA: Insufficient documentation

## 2012-02-26 DIAGNOSIS — K219 Gastro-esophageal reflux disease without esophagitis: Secondary | ICD-10-CM | POA: Insufficient documentation

## 2012-02-26 NOTE — ED Notes (Signed)
Per EMS, pt was smoking cigarettes when became SOB. Pt requested to have lungs checked out. Pt. 98% on RA.

## 2012-02-26 NOTE — ED Notes (Signed)
Call: [phone] for a ride when ready to be discharged.

## 2012-02-27 ENCOUNTER — Emergency Department (HOSPITAL_COMMUNITY): Payer: Medicaid Other

## 2012-02-27 LAB — POCT I-STAT TROPONIN I: Troponin i, poc: 0.01 ng/mL (ref 0.00–0.08)

## 2012-02-27 LAB — POCT I-STAT, CHEM 8
BUN: 13 mg/dL (ref 6–23)
Chloride: 102 mEq/L (ref 96–112)
Glucose, Bld: 92 mg/dL (ref 70–99)
HCT: 44 % (ref 36.0–46.0)
Potassium: 4.2 mEq/L (ref 3.5–5.1)

## 2012-02-27 LAB — CBC
HCT: 39.9 % (ref 36.0–46.0)
Hemoglobin: 14 g/dL (ref 12.0–15.0)
MCH: 31.4 pg (ref 26.0–34.0)
MCHC: 35.1 g/dL (ref 30.0–36.0)
MCV: 89.5 fL (ref 78.0–100.0)
RDW: 15.6 % — ABNORMAL HIGH (ref 11.5–15.5)

## 2012-02-27 MED ORDER — ALBUTEROL SULFATE (5 MG/ML) 0.5% IN NEBU
2.5000 mg | INHALATION_SOLUTION | Freq: Once | RESPIRATORY_TRACT | Status: AC
  Administered 2012-02-27: 2.5 mg via RESPIRATORY_TRACT
  Filled 2012-02-27: qty 20

## 2012-02-27 MED ORDER — AZITHROMYCIN 250 MG PO TABS: 250.0000 mg | ORAL_TABLET | Freq: Every day | ORAL | Status: DC

## 2012-02-27 MED ORDER — PREDNISONE 20 MG PO TABS
60.0000 mg | ORAL_TABLET | Freq: Once | ORAL | Status: AC
  Administered 2012-02-27: 60 mg via ORAL
  Filled 2012-02-27: qty 3

## 2012-02-27 MED ORDER — PREDNISONE 10 MG PO TABS: 50.0000 mg | ORAL_TABLET | Freq: Every day | ORAL | Status: DC

## 2012-02-27 NOTE — ED Provider Notes (Addendum)
History     CSN: [id number]  Arrival date & time 02/26/12  2004   First MD Initiated Contact with Patient 02/26/12 2341      Chief Complaint  Patient presents with  . Shortness of Breath    (Consider location/radiation/quality/duration/timing/severity/associated sxs/prior treatment) Patient is a 51 y.o. female presenting with shortness of breath. The history is provided by the patient.  Shortness of Breath  The current episode started today. The problem occurs rarely. The problem has been gradually improving. Nothing relieves the symptoms. Associated symptoms include shortness of breath.    Past Medical History  Diagnosis Date  . GERD (gastroesophageal reflux disease)   . Schizophrenia   . AIDS 12-10-2007  . Herpes simplex   . History of cholecystectomy   . Cataract   . Nonspecific reaction to tuberculin skin test without active tuberculosis 11-2008 [facility]   . Asthma   . Seizures     Past Surgical History  Procedure Date  . Cholecystectomy     Family History  Problem Relation Age of Onset  . Diabetes Sister   . Diabetes Brother     History  Substance Use Topics  . Smoking status: Current Every Day Smoker -- 3.0 packs/day for 35 years    Types: Cigarettes  . Smokeless tobacco: Never Used  . Alcohol Use: No    OB History    Grav Para Term Preterm Abortions TAB SAB Ect Mult Living                  Review of Systems  Respiratory: Positive for shortness of breath.   All other systems reviewed and are negative.    Allergies  Review of patient's allergies indicates no known allergies.  Home Medications   Current Outpatient Rx  Name  Route  Sig  Dispense  Refill  . ALBUTEROL SULFATE HFA 108 (90 BASE) MCG/ACT IN AERS   Inhalation   Inhale 2 puffs into the lungs every 6 (six) hours as needed. For shortness of breath         . ALBUTEROL SULFATE (2.5 MG/3ML) 0.083% IN NEBU   Nebulization   Take 3 mLs (2.5 mg total) by nebulization every 6 (six) hours as  needed for wheezing.   75 mL   12   . BENZTROPINE MESYLATE 0.5 MG PO TABS   Oral   Take 0.5 mg by mouth 2 (two) times daily.         Marland Kitchen CARBAMAZEPINE 200 MG PO TABS   Oral   Take 200-400 mg by mouth 2 (two) times daily. Take one tablet in the morning and two tablets at bedtime         . ISENTRESS 400 MG PO TABS      TAKE 1 TABLET BY MOUTH 2 TIMES DAILY.   60 each   6     RX SIG: TAKE 1 TABLET BY MOUTH 2 TIMES DAILY.<   . KALETRA 200-50 MG PO TABS      TAKE 2 TABLETS BY MOUTH 2 TIMES DAILY.   120 each   6     RX SIG: TAKE 2 TABLETS BY MOUTH 2 TIMES DAILY.<   . QUETIAPINE FUMARATE 50 MG PO TABS   Oral   Take 50 mg by mouth at bedtime.         Marland Kitchen RISPERIDONE 1 MG PO TABS   Oral   Take 1 mg by mouth daily at 12 noon.         Marland Kitchen  RISPERIDONE 4 MG PO TABS   Oral   Take 4 mg by mouth at bedtime.           Marland Kitchen RISPERIDONE MICROSPHERES 50 MG IM SUSR   Intramuscular   Inject 50 mg into the muscle every 14 (fourteen) days.           . TRUVADA 200-300 MG PO TABS      TAKE 1 TABLET BY MOUTH DAILY.   30 each   6     RX SIG: TAKE 1 TABLET BY MOUTH DAILY.<     BP 127/63  Pulse 79  Temp 97.6 F (36.4 C) (Oral)  Resp 19  SpO2 98%  Physical Exam  Constitutional: She is oriented to person, place, and time. She appears well-developed and well-nourished.  HENT:  Head: Normocephalic and atraumatic.  Eyes: Conjunctivae normal and EOM are normal. Pupils are equal, round, and reactive to light.  Neck: Normal range of motion.  Cardiovascular: Normal rate, regular rhythm and normal heart sounds.   Pulmonary/Chest: Effort normal and breath sounds normal.  Abdominal: Soft. Bowel sounds are normal.  Musculoskeletal: Normal range of motion.  Neurological: She is alert and oriented to person, place, and time.  Skin: Skin is warm and dry.  Psychiatric: She has a normal mood and affect. Her behavior is normal.    ED Course  Procedures (including critical care  time)   Labs Reviewed  CBC   No results found.   No diagnosis found.    MDM  Pt smoking,  Copd,  Cough today.  Also hiv.  Will check labs,  Chest xray,  Prednisone.  Breathing treatment,  reassess    Improved.  wil ltreat copd exacerbation,  Dc to fu,  Ret new/worsenign sxs    Taressa Rauh Lytle Michaels, MD 02/27/12 0006  Yaneli Keithley Lytle Michaels, MD 02/27/12 1610

## 2012-04-08 ENCOUNTER — Other Ambulatory Visit (INDEPENDENT_AMBULATORY_CARE_PROVIDER_SITE_OTHER): Payer: Medicaid Other

## 2012-04-08 DIAGNOSIS — Z21 Asymptomatic human immunodeficiency virus [HIV] infection status: Secondary | ICD-10-CM

## 2012-04-08 DIAGNOSIS — B20 Human immunodeficiency virus [HIV] disease: Secondary | ICD-10-CM

## 2012-04-08 LAB — CBC WITH DIFFERENTIAL/PLATELET
Hemoglobin: 14.2 g/dL (ref 12.0–15.0)
Lymphocytes Relative: 39 % (ref 12–46)
Lymphs Abs: 1.7 10*3/uL (ref 0.7–4.0)
Monocytes Relative: 9 % (ref 3–12)
Neutrophils Relative %: 48 % (ref 43–77)
Platelets: 270 10*3/uL (ref 150–400)
RBC: 4.63 MIL/uL (ref 3.87–5.11)
WBC: 4.4 10*3/uL (ref 4.0–10.5)

## 2012-04-08 LAB — COMPREHENSIVE METABOLIC PANEL
ALT: 14 U/L (ref 0–35)
Albumin: 4.3 g/dL (ref 3.5–5.2)
CO2: 26 mEq/L (ref 19–32)
Calcium: 9.4 mg/dL (ref 8.4–10.5)
Chloride: 102 mEq/L (ref 96–112)
Potassium: 4.4 mEq/L (ref 3.5–5.3)
Sodium: 137 mEq/L (ref 135–145)
Total Bilirubin: 0.6 mg/dL (ref 0.3–1.2)
Total Protein: 7.2 g/dL (ref 6.0–8.3)

## 2012-04-09 LAB — HIV-1 RNA QUANT-NO REFLEX-BLD: HIV 1 RNA Quant: <20 copies/mL (ref <20)

## 2012-04-23 ENCOUNTER — Encounter: Payer: Self-pay | Admitting: Internal Medicine

## 2012-04-23 ENCOUNTER — Ambulatory Visit (INDEPENDENT_AMBULATORY_CARE_PROVIDER_SITE_OTHER): Payer: Medicaid Other | Admitting: Internal Medicine

## 2012-04-23 VITALS — BP 107/71 | HR 76 | Temp 97.3°F | Wt 134.0 lb

## 2012-04-23 DIAGNOSIS — Z21 Asymptomatic human immunodeficiency virus [HIV] infection status: Secondary | ICD-10-CM

## 2012-04-23 DIAGNOSIS — B1910 Unspecified viral hepatitis B without hepatic coma: Secondary | ICD-10-CM

## 2012-04-23 DIAGNOSIS — B20 Human immunodeficiency virus [HIV] disease: Secondary | ICD-10-CM

## 2012-04-23 NOTE — Progress Notes (Signed)
[facility] HIV CLINIC NOTE  RFV: routine visit Subjective:    Patient ID: [name], female    DOB: [birth year], 52 y.o.   MRN: [id number]  HPI 51yo F with HIV/HBV, schizophrenia, developmental delay Cd 4 count 490/ VL < 20, on raltegravir/truvada/kaletra. Psychiatrist recently changed meds due to having tremulousness in the evening. Patient is having her meds dispensed by home health nurse to ensure compliance and med management.  Mother passed away last month, coping well with the loss of mother. Death due to cancer  Current Outpatient Prescriptions on File Prior to Visit  Medication Sig Dispense Refill  . albuterol (PROVENTIL HFA;VENTOLIN HFA) 108 (90 BASE) MCG/ACT inhaler Inhale 2 puffs into the lungs every 6 (six) hours as needed. For shortness of breath      . albuterol (PROVENTIL) (2.5 MG/3ML) 0.083% nebulizer solution Take 3 mLs (2.5 mg total) by nebulization every 6 (six) hours as needed for wheezing.  75 mL  12  . azithromycin (ZITHROMAX) 250 MG tablet Take 1 tablet (250 mg total) by mouth daily. Take first 2 tablets together, then 1 every day until finished.  6 tablet  0  . benztropine (COGENTIN) 0.5 MG tablet Take 0.5 mg by mouth 2 (two) times daily.      . carbamazepine (TEGRETOL) 200 MG tablet Take 200-400 mg by mouth 2 (two) times daily. Take one tablet in the morning and two tablets at bedtime      . ISENTRESS 400 MG tablet TAKE 1 TABLET BY MOUTH 2 TIMES DAILY.  60 each  6  . KALETRA 200-50 MG per tablet TAKE 2 TABLETS BY MOUTH 2 TIMES DAILY.  120 each  6  . predniSONE (DELTASONE) 10 MG tablet Take 5 tablets (50 mg total) by mouth daily.  20 tablet  0  . QUEtiapine (SEROQUEL) 50 MG tablet Take 50 mg by mouth at bedtime.      . risperiDONE (RISPERDAL) 1 MG tablet Take 1 mg by mouth daily at 12 noon.      . risperidone (RISPERDAL) 4 MG tablet Take 4 mg by mouth at bedtime.        . risperiDONE microspheres (RISPERDAL CONSTA) 50 MG injection Inject 50 mg into the muscle every 14  (fourteen) days.        . TRUVADA 200-300 MG per tablet TAKE 1 TABLET BY MOUTH DAILY.  30 each  6   Active Ambulatory Problems    Diagnosis Date Noted  . Asthma 08/18/2010  . HIV (human immunodeficiency virus infection) 08/18/2010  . Schizophrenia 08/18/2010  . Herpes simplex infection 08/18/2010   Resolved Ambulatory Problems    Diagnosis Date Noted  . No Resolved Ambulatory Problems   Past Medical History  Diagnosis Date  . GERD (gastroesophageal reflux disease)   . AIDS 12-10-2007  . Herpes simplex   . History of cholecystectomy   . Cataract   . Nonspecific reaction to tuberculin skin test without active tuberculosis 11-2008 [facility]   . Asthma   . Seizures       Review of Systems     Objective:   Physical Exam BP 107/71  Pulse 76  Temp 97.3 F (36.3 C) (Oral)  Wt 134 lb (60.782 kg) Physical Exam  Constitutional:  oriented to person, place, and time.  appears well-developed and well-nourished. No distress.  HENT:  Mouth/Throat: Oropharynx is clear and moist. No oropharyngeal exudate.  Cardiovascular: Normal rate, regular rhythm and normal heart sounds. Exam reveals no gallop and no friction rub.  No murmur heard.  Pulmonary/Chest: Effort normal and breath sounds normal. No respiratory distress.  no wheezes.  Abdominal: Soft. Bowel sounds are normal.  exhibits no distension. There is no tenderness.  Lymphadenopathy:  no cervical adenopathy.  Skin: Skin is warm and dry. No rash noted. No erythema.       Assessment & Plan:  HIV = continue HIV regimen. Check labs in 3 months  Hep B = will check hep b s ab, hep b viral load  Health maintenance = will schedule mammo and pap  Situational depression = patient does no subscribe to any SI, appropriately mourning loss of family member

## 2012-05-02 ENCOUNTER — Encounter: Payer: Self-pay | Admitting: *Deleted

## 2012-05-07 ENCOUNTER — Ambulatory Visit
Admission: RE | Admit: 2012-05-07 | Discharge: 2012-05-07 | Disposition: A | Discharge status: Home / Self Care | Payer: Medicaid Other | Source: Ambulatory Visit | Attending: Family Medicine | Admitting: Family Medicine

## 2012-05-07 ENCOUNTER — Telehealth: Payer: Self-pay | Admitting: *Deleted

## 2012-05-07 ENCOUNTER — Ambulatory Visit (INDEPENDENT_AMBULATORY_CARE_PROVIDER_SITE_OTHER): Payer: Medicaid Other | Admitting: Family Medicine

## 2012-05-07 ENCOUNTER — Encounter: Payer: Self-pay | Admitting: Family Medicine

## 2012-05-07 VITALS — BP 107/72 | HR 65 | Ht 59.0 in | Wt 135.0 lb

## 2012-05-07 DIAGNOSIS — N3944 Nocturnal enuresis: Secondary | ICD-10-CM | POA: Insufficient documentation

## 2012-05-07 DIAGNOSIS — Z0000 Encounter for general adult medical examination without abnormal findings: Secondary | ICD-10-CM

## 2012-05-07 DIAGNOSIS — R011 Cardiac murmur, unspecified: Secondary | ICD-10-CM | POA: Insufficient documentation

## 2012-05-07 DIAGNOSIS — R32 Unspecified urinary incontinence: Secondary | ICD-10-CM

## 2012-05-07 DIAGNOSIS — R159 Full incontinence of feces: Secondary | ICD-10-CM | POA: Insufficient documentation

## 2012-05-07 DIAGNOSIS — L84 Corns and callosities: Secondary | ICD-10-CM

## 2012-05-07 LAB — POCT URINALYSIS DIPSTICK
Bilirubin, UA: NEGATIVE
Ketones, UA: NEGATIVE
Spec Grav, UA: 1.025
pH, UA: 6

## 2012-05-07 LAB — POCT UA - MICROSCOPIC ONLY

## 2012-05-07 MED ORDER — DEPEND OVERNIGHT BRIEFS MEDIUM MISC: Status: DC

## 2012-05-07 NOTE — Assessment & Plan Note (Signed)
Pt reports got pap &  Mammogram about 1 year ago at Dr. Fredirick Maudlin office (OBGYN). Will obtain records to f/u what health maintenance tests are due.

## 2012-05-07 NOTE — Patient Instructions (Addendum)
It was nice to meet you today!  For your urine, we are running a test to see if you have an infection. I will call you if we need to treat you with an antibiotic. I have ordered your Depends and sent them to your pharmacy, and also printed off an order for you. Please call the clinic if they are not the right kind of depends, as I can always send a different order.  For your stool, I would like to get an x-ray. If you are unable to have bowel movements, have any back pain, or weakness in your legs, come back to the clinic as soon as possible.  Schedule a follow up appointment to be seen in 1 month.  Be well, Dr. Pollie Meyer

## 2012-05-07 NOTE — Progress Notes (Signed)
Patient ID: [name], female   DOB: 1961-03-28, 52 y.o.   MRN: [id number]   CC: [name] is a 52 y.o. female here to establish care. Was previously a patient of Dr. Clarene Duke in [place] (OB/GYN). Pt has a hx of schizophrenia and is followed by an ACT team, an ACT nurse accompanies her to today's appointment.  HPI:  Foot callouses: Has a painful callous on her right foot. Tried to go to Dr. Brynda Greathouse of podiatry, but she just recently got medicaid and requires a referral. No pus or drainage.  Urinary incontinence: requests rx for Depends pullups. She has been using them at home for some time. She is incontinent only at night, not during the day. She is incontinent into the pullup every night. No dysuria. She has had one child in the past.  Fecal incontinence: Several times has stooled on herself with diarrhea. Her ACT nurse brings this up. No abdominal pain. Does have diarrhea, most frequently when she drinks coffee. Last incontinence of stool was three weeks ago (runny). She does have normal bowel movements other times. No pain in her back or weakness in her legs. No blood in stool.  ROS: See HPI. Also: Denies CP or SOB. No fevers.  Past Medical Hx: Schizophrenia - followed by ACT team HIV - seen in ID clinic every 3 months Hepatitis B - seen in ID clinic every 3 months COPD - dx'd in 2013. Uses nebulizer about twice per day, also has inhaler.  Surgical Hx: Surgery on R leg from old accident Has had an abdominal surgery (has a scar) but does not know when or where this took place, or what procedure was done. Per EMR, has hx of cholecystectomy  Health Maintenance: Reports last pap/mammogram were last year at Dr. Fredirick Maudlin office.  Social Hx: Highest level of education was 11th grade. Unemployed. No sexual partners currently Lives with brother and helps take care of him (he has diabetes). Walks every day for exercise. Smokes 1 ppd of cigarettes since age 87. No current drug use - says  has been to NA/AA in the past. Minimal alcohol use (had 1-2 glasses of wine at new years).  Family Hx: Lung cancer & HTN in mother Breast cancer in sister Heart disease in father Diabetes & HTN in brother  PHYSICAL EXAM: BP 107/72  Pulse 65  Ht 4\' 11"  (1.499 m)  Wt 135 lb (61.236 kg)  BMI 27.27 kg/m2 Gen: NAD, pleasant, cooperative, talkative Heart: RRR, 2/6 systolic murmur loudest with standing, difficult to hear with squatting but pt is breathing harder Lungs: CTAB, NWOB Abd: soft, nontender Rectal: pt refused Ext: R foot with plantar callous laterally, some cracked skin present but no signs of infection. Normal strength in bilateral lower extremities. Psych: pleasant, talkative but not pressured speech, denies SI/HI. Tearful when talks of feeling lonely.

## 2012-05-07 NOTE — Telephone Encounter (Signed)
Called and gave appointment to patient. [facility] on 05/14/2012 at 10:00 am.Busick, Rodena Medin

## 2012-05-09 ENCOUNTER — Encounter: Payer: Self-pay | Admitting: Family Medicine

## 2012-05-09 ENCOUNTER — Telehealth: Payer: Self-pay | Admitting: Family Medicine

## 2012-05-09 DIAGNOSIS — B1910 Unspecified viral hepatitis B without hepatic coma: Secondary | ICD-10-CM | POA: Insufficient documentation

## 2012-05-09 LAB — URINE CULTURE

## 2012-05-09 MED ORDER — CEPHALEXIN 500 MG PO TABS: 500.0000 mg | ORAL_TABLET | Freq: Three times a day (TID) | ORAL | Status: DC

## 2012-05-09 NOTE — Telephone Encounter (Signed)
Called patient to let her know that she has a UTI (cx grew e coli, pan sensitive) and that I am sending in an rx for keflex 500mg  TID x7 days. Advised she pick up the medicine as soon as possible (has HIV so is immunocompromised at baseline). She understands and will get the medicine.  She'll call if she has any problems.   She also asked about her depends pullups rx. [facility] needs me to sign a form. Explained that I will take care of the form for her.

## 2012-05-09 NOTE — Assessment & Plan Note (Addendum)
Auscultated on exam today. Difficult to say for sure whether this is a benign murmur; it seemed to get louder with standing vs. squatting. Pt is currently asymptomatic (denies CP or SOB). Will plan to auscultate heart regularly and monitor murmur. May benefit from echo at some point. Discussed this problem after visit with Dr. Earnest Bailey who agrees with this plan. F/u in one month.

## 2012-05-09 NOTE — Assessment & Plan Note (Addendum)
Pt & ACT nurse report fecal incontinence on occasion. Would have liked to perform rectal exam today to evaluate for stool in the rectal vault and rectal tone, but pt refuses rectal exam today. Will instead obtain KUB to look for stool burden. No red flags (weakness in lower extremities, back pain). Counseled pt on these red flags and asked her to return sooner if she notices any of them. Went over instructions in presence of ACT nurse as well. F/u in one month.  Precepted with Dr. Sarah [place] who agrees with this plan.

## 2012-05-09 NOTE — Telephone Encounter (Signed)
The [facility] form requested quantity 200 each of small, med, and large diapers monthly. This is excessive - called [facility] to clarify. They will have Verl Bangs, one of their employees, call our office tomorrow to discuss and they will also send a new order form.

## 2012-05-09 NOTE — Assessment & Plan Note (Signed)
Pt reports longstanding nighttime urinary incontinence (per ACT nurse to our nurse, several years duration) and requests rx for depends pullups. She has been buying these herself but now that she has medicaid it will be cheaper for her to have an rx. Unclear etiology of the incontinence. Could be secondary to cognition, or infection, or idiopathic. Will rx depends for now and check UA with culture to evaluate for UTI. F/u in one month.

## 2012-05-09 NOTE — Assessment & Plan Note (Signed)
Callous on R foot which is causing pt pain. No signs of infection or drainage. Will refer to podiatry. Precepted with Dr. Sarah [place].

## 2012-05-10 NOTE — Telephone Encounter (Signed)
I have adjusted the quantity on the form to be #60 of medium sized pull-ups per month. Will place in to-fax box.

## 2012-05-10 NOTE — Telephone Encounter (Signed)
[facility] called to let Dr. Pollie Meyer know that the reason that 200 are ordered is because this is what Medicaid will allow.  The amount is an automatic thing, but less can be ordered.  Please feel free to give her a call back.

## 2012-05-10 NOTE — Telephone Encounter (Signed)
Will fwd. To PCP. .[name]  

## 2012-06-18 ENCOUNTER — Other Ambulatory Visit: Payer: Self-pay | Admitting: Licensed Clinical Social Worker

## 2012-06-18 DIAGNOSIS — B20 Human immunodeficiency virus [HIV] disease: Secondary | ICD-10-CM

## 2012-06-18 MED ORDER — EMTRICITABINE-TENOFOVIR DF 200-300 MG PO TABS: ORAL_TABLET | ORAL | Status: DC

## 2012-06-18 MED ORDER — LOPINAVIR-RITONAVIR 200-50 MG PO TABS: ORAL_TABLET | ORAL | Status: DC

## 2012-06-18 MED ORDER — RALTEGRAVIR POTASSIUM 400 MG PO TABS: ORAL_TABLET | ORAL | Status: DC

## 2012-07-16 ENCOUNTER — Encounter: Payer: Self-pay | Admitting: *Deleted

## 2012-07-22 ENCOUNTER — Other Ambulatory Visit (INDEPENDENT_AMBULATORY_CARE_PROVIDER_SITE_OTHER): Payer: Medicaid Other

## 2012-07-22 ENCOUNTER — Other Ambulatory Visit: Payer: Self-pay | Admitting: Internal Medicine

## 2012-07-22 DIAGNOSIS — Z79899 Other long term (current) drug therapy: Secondary | ICD-10-CM

## 2012-07-22 DIAGNOSIS — B20 Human immunodeficiency virus [HIV] disease: Secondary | ICD-10-CM

## 2012-07-22 LAB — CBC WITH DIFFERENTIAL/PLATELET
Basophils Absolute: 0 10*3/uL (ref 0.0–0.1)
Basophils Relative: 1 % (ref 0–1)
Eosinophils Relative: 2 % (ref 0–5)
HCT: 42.5 % (ref 36.0–46.0)
MCHC: 34.1 g/dL (ref 30.0–36.0)
MCV: 92.4 fL (ref 78.0–100.0)
Monocytes Absolute: 0.3 10*3/uL (ref 0.1–1.0)
Neutro Abs: 2.6 10*3/uL (ref 1.7–7.7)
RDW: 14.7 % (ref 11.5–15.5)

## 2012-07-22 LAB — COMPREHENSIVE METABOLIC PANEL
AST: 20 U/L (ref 0–37)
Alkaline Phosphatase: 118 U/L — ABNORMAL HIGH (ref 39–117)
BUN: 11 mg/dL (ref 6–23)
Creat: 1.05 mg/dL (ref 0.50–1.10)
Total Bilirubin: 0.4 mg/dL (ref 0.3–1.2)

## 2012-07-23 LAB — T-HELPER CELL (CD4) - (RCID CLINIC ONLY)
CD4 % Helper T Cell: 28 % — ABNORMAL LOW (ref 33–55)
CD4 T Cell Abs: 590 uL (ref 400–2700)

## 2012-08-06 ENCOUNTER — Encounter: Payer: Self-pay | Admitting: Internal Medicine

## 2012-08-06 ENCOUNTER — Ambulatory Visit (INDEPENDENT_AMBULATORY_CARE_PROVIDER_SITE_OTHER): Payer: Medicaid Other | Admitting: Internal Medicine

## 2012-08-06 VITALS — BP 143/73 | HR 76 | Temp 97.4°F | Wt 143.0 lb

## 2012-08-06 DIAGNOSIS — B20 Human immunodeficiency virus [HIV] disease: Secondary | ICD-10-CM

## 2012-08-06 DIAGNOSIS — Z0000 Encounter for general adult medical examination without abnormal findings: Secondary | ICD-10-CM

## 2012-08-06 MED ORDER — RITONAVIR 100 MG PO TABS: 100.0000 mg | ORAL_TABLET | Freq: Every day | ORAL | Status: DC

## 2012-08-06 MED ORDER — DARUNAVIR ETHANOLATE 800 MG PO TABS: 800.0000 mg | ORAL_TABLET | Freq: Every day | ORAL | Status: DC

## 2012-08-06 NOTE — Progress Notes (Signed)
[facility] NOTE  RFV: routine Subjective:    Patient ID: [name], female    DOB: 09/18/1960, 52 y.o.   MRN: [id number]  HPI 51yo F with HIV/HBV, schizophrenia, developmental delay. Cd 4 count 490/ VL < 20, on raltegravir/truvada/kaletra. Doing well with medications due to her having home health RN doing her med box and checking in with her. She has been seen by her psychiatrist. Doing well.  Denies fever, chills, nightsweats, diarrhea, rash, headache  Current Outpatient Prescriptions on File Prior to Visit  Medication Sig Dispense Refill  . albuterol (PROVENTIL HFA;VENTOLIN HFA) 108 (90 BASE) MCG/ACT inhaler Inhale 2 puffs into the lungs every 6 (six) hours as needed. For shortness of breath      . albuterol (PROVENTIL) (2.5 MG/3ML) 0.083% nebulizer solution Take 3 mLs (2.5 mg total) by nebulization every 6 (six) hours as needed for wheezing.  75 mL  12  . benztropine (COGENTIN) 0.5 MG tablet Take 0.5 mg by mouth 2 (two) times daily.      . carbamazepine (TEGRETOL) 200 MG tablet Take 200-400 mg by mouth 2 (two) times daily. Take one tablet in the morning and two tablets at bedtime      . Cephalexin 500 MG tablet Take 1 tablet (500 mg total) by mouth 3 (three) times daily. For 7 days  21 tablet  0  . emtricitabine-tenofovir (TRUVADA) 200-300 MG per tablet TAKE 1 TABLET BY MOUTH DAILY.  30 tablet  3  . Incontinence Supply Disposable (DEPEND OVERNIGHT BRIEFS MEDIUM) MISC Wear depend brief overnight  20 each  2  . lopinavir-ritonavir (KALETRA) 200-50 MG per tablet TAKE 2 TABLETS BY MOUTH 2 TIMES DAILY.  120 tablet  3  . QUEtiapine (SEROQUEL) 50 MG tablet Take 50 mg by mouth at bedtime.      . raltegravir (ISENTRESS) 400 MG tablet TAKE 1 TABLET BY MOUTH 2 TIMES DAILY.  60 tablet  3  . risperiDONE (RISPERDAL) 1 MG tablet Take 1 mg by mouth daily at 12 noon.      . risperidone (RISPERDAL) 4 MG tablet Take 4 mg by mouth at bedtime.        . risperiDONE microspheres (RISPERDAL CONSTA) 50 MG  injection Inject 50 mg into the muscle every 14 (fourteen) days.        . Dextromethorphan-Guaifenesin (Q-TUSSIN DM) 10-100 MG/5ML liquid Take 5 mLs by mouth 3 (three) times daily as needed.       No current facility-administered medications on file prior to visit.   Active Ambulatory Problems    Diagnosis Date Noted  . Asthma 08/18/2010  . HIV (human immunodeficiency virus infection) 08/18/2010  . Schizophrenia 08/18/2010  . Herpes simplex infection 08/18/2010  . Pre-ulcerative corn or callous 05/07/2012  . Urinary incontinence, nocturnal enuresis 05/07/2012  . Fecal incontinence 05/07/2012  . Systolic murmur 05/07/2012  . Routine health maintenance 05/07/2012  . Hepatitis B infection    Resolved Ambulatory Problems    Diagnosis Date Noted  . No Resolved Ambulatory Problems   Past Medical History  Diagnosis Date  . GERD (gastroesophageal reflux disease)   . AIDS 12-10-2007  . Herpes simplex   . History of cholecystectomy   . Cataract   . Nonspecific reaction to tuberculin skin test without active tuberculosis 11-2008 [facility]   . Asthma   . Seizures   . COPD (chronic obstructive pulmonary disease)     Social hx: smokes 1 ppd now." i need my cigarettes right now"   Review  of Systems 10 point ROS is negative    Objective:   Physical Exam  BP 143/73  Pulse 76  Temp(Src) 97.4 F (36.3 C) (Oral)  Wt 143 lb (64.864 kg)  BMI 28.87 kg/m2 Physical Exam  Constitutional:  oriented to person, place, and time.  appears well-developed and well-nourished. No distress.  HENT:  Mouth/Throat: Oropharynx is clear and moist. No oropharyngeal exudate.  Cardiovascular: Normal rate, regular rhythm and normal heart sounds. Exam reveals no gallop and no friction rub.  No murmur heard.  Pulmonary/Chest: Effort normal and breath sounds normal. No respiratory distress.  no wheezes.  Lymphadenopathy: no cervical adenopathy.  Skin: Skin is warm and dry. No rash noted. No erythema.        Assessment & Plan:  hiv = will change her regimen to raltegravir BID, DRVr qday and truvada Q day. Will discontinue kaletra. These changes have been discussed with her home health RN who is present with her during this visit and understands the changes to meds  Health maintenance = set up appt for pap smear, and also get mammo appointment  Smoking cessation = precontemplative. Home health RN also giving advice to minimize smoking in setting of COPD  rtc in 3 month

## 2012-08-19 ENCOUNTER — Ambulatory Visit (INDEPENDENT_AMBULATORY_CARE_PROVIDER_SITE_OTHER): Payer: Medicaid Other | Admitting: *Deleted

## 2012-08-19 ENCOUNTER — Other Ambulatory Visit (HOSPITAL_COMMUNITY)
Admission: RE | Admit: 2012-08-19 | Discharge: 2012-08-19 | Disposition: A | Discharge status: Home / Self Care | Payer: Medicaid Other | Source: Ambulatory Visit | Attending: Infectious Disease | Admitting: Infectious Disease

## 2012-08-19 DIAGNOSIS — Z124 Encounter for screening for malignant neoplasm of cervix: Secondary | ICD-10-CM

## 2012-08-19 DIAGNOSIS — Z113 Encounter for screening for infections with a predominantly sexual mode of transmission: Secondary | ICD-10-CM | POA: Insufficient documentation

## 2012-08-19 DIAGNOSIS — Z01419 Encounter for gynecological examination (general) (routine) without abnormal findings: Secondary | ICD-10-CM | POA: Insufficient documentation

## 2012-08-19 DIAGNOSIS — N760 Acute vaginitis: Secondary | ICD-10-CM | POA: Insufficient documentation

## 2012-08-19 NOTE — Patient Instructions (Addendum)
Your results will be ready in about a week.  I will mail them to you.  Thank you for coming to the Center for your care.  Khole Branch,  RN 

## 2012-08-19 NOTE — Progress Notes (Signed)
  Subjective:     [name] is a 52 y.o. woman who comes in today for a  pap smear only.  Previous abnormal Pap smears: no. Contraception: unknown.  Pt complained of heartburn and abdominal aching.  Has mammogram already scheduled.  Pt requesting assistance with obtain dentures.    Objective:    There were no vitals taken for this visit. Pelvic Exam: Pap smear obtained.   Assessment:    Screening pap smear.   Plan:    Follow up in one year, or as indicated by Pap results.   Pt given educational materials re:  HIV and women, BSE, PAP smears, diet and exercise, partner protection, and self-esteem.  Pt offered condoms.  Will notify HIV MD that pt having heartburn and abdominal aching.  No current medication on med profile for these problems.  Next scheduled appt w/ MD in July, 2014.  Green referral form completed for [facility].  Pt needs to call [facility] with current phone number.

## 2012-08-26 ENCOUNTER — Encounter: Payer: Self-pay | Admitting: *Deleted

## 2012-08-29 ENCOUNTER — Other Ambulatory Visit: Payer: Self-pay | Admitting: *Deleted

## 2012-08-29 DIAGNOSIS — J45909 Unspecified asthma, uncomplicated: Secondary | ICD-10-CM

## 2012-08-29 DIAGNOSIS — J45901 Unspecified asthma with (acute) exacerbation: Secondary | ICD-10-CM

## 2012-08-29 MED ORDER — ALBUTEROL SULFATE HFA 108 (90 BASE) MCG/ACT IN AERS: 2.0000 | INHALATION_SPRAY | Freq: Four times a day (QID) | RESPIRATORY_TRACT | Status: DC | PRN

## 2012-08-29 MED ORDER — ALBUTEROL SULFATE (2.5 MG/3ML) 0.083% IN NEBU: 2.5000 mg | INHALATION_SOLUTION | Freq: Four times a day (QID) | RESPIRATORY_TRACT | Status: DC | PRN

## 2012-09-03 ENCOUNTER — Ambulatory Visit
Admission: RE | Admit: 2012-09-03 | Discharge: 2012-09-03 | Disposition: A | Discharge status: Home / Self Care | Payer: Medicaid Other | Source: Ambulatory Visit | Attending: Internal Medicine | Admitting: Internal Medicine

## 2012-09-03 ENCOUNTER — Ambulatory Visit: Payer: Medicaid Other

## 2012-09-03 DIAGNOSIS — Z0000 Encounter for general adult medical examination without abnormal findings: Secondary | ICD-10-CM

## 2012-09-04 ENCOUNTER — Other Ambulatory Visit: Payer: Self-pay | Admitting: Internal Medicine

## 2012-09-04 DIAGNOSIS — R928 Other abnormal and inconclusive findings on diagnostic imaging of breast: Secondary | ICD-10-CM

## 2012-09-06 ENCOUNTER — Other Ambulatory Visit: Payer: Self-pay | Admitting: Internal Medicine

## 2012-09-06 DIAGNOSIS — R928 Other abnormal and inconclusive findings on diagnostic imaging of breast: Secondary | ICD-10-CM

## 2012-09-19 ENCOUNTER — Ambulatory Visit
Admission: RE | Admit: 2012-09-19 | Discharge: 2012-09-19 | Disposition: A | Discharge status: Home / Self Care | Payer: Medicaid Other | Source: Ambulatory Visit | Attending: Internal Medicine | Admitting: Internal Medicine

## 2012-09-19 DIAGNOSIS — R928 Other abnormal and inconclusive findings on diagnostic imaging of breast: Secondary | ICD-10-CM

## 2012-10-04 ENCOUNTER — Other Ambulatory Visit: Payer: Self-pay | Admitting: *Deleted

## 2012-10-04 DIAGNOSIS — B20 Human immunodeficiency virus [HIV] disease: Secondary | ICD-10-CM

## 2012-10-04 DIAGNOSIS — J45909 Unspecified asthma, uncomplicated: Secondary | ICD-10-CM

## 2012-10-04 MED ORDER — EMTRICITABINE-TENOFOVIR DF 200-300 MG PO TABS: ORAL_TABLET | ORAL | Status: DC

## 2012-10-04 MED ORDER — RALTEGRAVIR POTASSIUM 400 MG PO TABS: ORAL_TABLET | ORAL | Status: DC

## 2012-10-04 MED ORDER — ALBUTEROL SULFATE (2.5 MG/3ML) 0.083% IN NEBU: 2.5000 mg | INHALATION_SOLUTION | Freq: Four times a day (QID) | RESPIRATORY_TRACT | Status: DC | PRN

## 2012-10-22 ENCOUNTER — Other Ambulatory Visit: Payer: Medicaid Other

## 2012-10-22 DIAGNOSIS — Z79899 Other long term (current) drug therapy: Secondary | ICD-10-CM

## 2012-10-22 DIAGNOSIS — B20 Human immunodeficiency virus [HIV] disease: Secondary | ICD-10-CM

## 2012-10-22 DIAGNOSIS — Z113 Encounter for screening for infections with a predominantly sexual mode of transmission: Secondary | ICD-10-CM

## 2012-10-22 LAB — CBC WITH DIFFERENTIAL/PLATELET
Basophils Absolute: 0 10*3/uL (ref 0.0–0.1)
Basophils Relative: 1 % (ref 0–1)
HCT: 43.9 % (ref 36.0–46.0)
Hemoglobin: 14.6 g/dL (ref 12.0–15.0)
Lymphocytes Relative: 39 % (ref 12–46)
MCHC: 33.3 g/dL (ref 30.0–36.0)
Monocytes Absolute: 0.5 10*3/uL (ref 0.1–1.0)
Monocytes Relative: 9 % (ref 3–12)
Neutro Abs: 2.8 10*3/uL (ref 1.7–7.7)
Neutrophils Relative %: 49 % (ref 43–77)
RBC: 4.82 MIL/uL (ref 3.87–5.11)
WBC: 5.6 10*3/uL (ref 4.0–10.5)

## 2012-10-22 LAB — COMPREHENSIVE METABOLIC PANEL
AST: 21 U/L (ref 0–37)
Albumin: 4.3 g/dL (ref 3.5–5.2)
Alkaline Phosphatase: 115 U/L (ref 39–117)
BUN: 12 mg/dL (ref 6–23)
Calcium: 9.4 mg/dL (ref 8.4–10.5)
Chloride: 98 mEq/L (ref 96–112)
Glucose, Bld: 85 mg/dL (ref 70–99)
Potassium: 4.5 mEq/L (ref 3.5–5.3)
Sodium: 133 mEq/L — ABNORMAL LOW (ref 135–145)
Total Protein: 7.1 g/dL (ref 6.0–8.3)

## 2012-10-22 LAB — LIPID PANEL
HDL: 55 mg/dL (ref >39)
LDL Cholesterol: 87 mg/dL (ref 0–99)
Total CHOL/HDL Ratio: 2.9 Ratio

## 2012-10-23 LAB — T-HELPER CELL (CD4) - (RCID CLINIC ONLY)
CD4 % Helper T Cell: 27 % — ABNORMAL LOW (ref 33–55)
CD4 T Cell Abs: 570 uL (ref 400–2700)

## 2012-11-05 ENCOUNTER — Ambulatory Visit (INDEPENDENT_AMBULATORY_CARE_PROVIDER_SITE_OTHER): Payer: Medicaid Other | Admitting: Internal Medicine

## 2012-11-05 ENCOUNTER — Encounter: Payer: Self-pay | Admitting: Internal Medicine

## 2012-11-05 VITALS — BP 126/82 | HR 75 | Temp 97.4°F | Wt 147.0 lb

## 2012-11-05 DIAGNOSIS — B20 Human immunodeficiency virus [HIV] disease: Secondary | ICD-10-CM

## 2012-11-05 NOTE — Progress Notes (Signed)
[facility] NOTE  RFV: routine  Subjective:    Patient ID: [name], female    DOB: 1960/09/20, 52 y.o.   MRN: [id number]  HPI 51yo F with HIV/HBV, schizophrenia, developmental delay. Cd 4 count 570/ VL 33, on raltegravir/truvada/darunavir/ritonavir. Doing well with medications due to her having home health RN doing her med box and checking in with her. She is here with her , miss lilian.  Doing well.  Denies fever, chills, nightsweats, diarrhea, rash, headache  Decreased her smoking down to 1ppd, family controlling her smokes.  She states that she is decreasing from 3ppd down to 1 ppd.   Current Outpatient Prescriptions on File Prior to Visit  Medication Sig Dispense Refill  . albuterol (PROVENTIL HFA;VENTOLIN HFA) 108 (90 BASE) MCG/ACT inhaler Inhale 2 puffs into the lungs every 6 (six) hours as needed. For shortness of breath  1 Inhaler  2  . albuterol (PROVENTIL) (2.5 MG/3ML) 0.083% nebulizer solution Take 3 mLs (2.5 mg total) by nebulization every 6 (six) hours as needed for wheezing.  75 mL  1  . benztropine (COGENTIN) 0.5 MG tablet Take 0.5 mg by mouth 2 (two) times daily.      . carbamazepine (TEGRETOL) 200 MG tablet Take 200-400 mg by mouth 2 (two) times daily. Take one tablet in the morning and two tablets at bedtime      . Cephalexin 500 MG tablet Take 1 tablet (500 mg total) by mouth 3 (three) times daily. For 7 days  21 tablet  0  . Darunavir Ethanolate (PREZISTA) 800 MG tablet Take 1 tablet (800 mg total) by mouth daily with breakfast.  30 tablet  11  . Dextromethorphan-Guaifenesin (Q-TUSSIN DM) 10-100 MG/5ML liquid Take 5 mLs by mouth 3 (three) times daily as needed.      Marland Kitchen emtricitabine-tenofovir (TRUVADA) 200-300 MG per tablet TAKE 1 TABLET BY MOUTH DAILY.  30 tablet  3  . Incontinence Supply Disposable (DEPEND OVERNIGHT BRIEFS MEDIUM) MISC Wear depend brief overnight  20 each  2  . QUEtiapine (SEROQUEL) 50 MG tablet Take 50 mg by mouth at bedtime.      . raltegravir  (ISENTRESS) 400 MG tablet TAKE 1 TABLET BY MOUTH 2 TIMES DAILY.  60 tablet  3  . risperiDONE (RISPERDAL) 1 MG tablet Take 1 mg by mouth daily at 12 noon.      . risperidone (RISPERDAL) 4 MG tablet Take 4 mg by mouth at bedtime.        . risperiDONE microspheres (RISPERDAL CONSTA) 50 MG injection Inject 50 mg into the muscle every 14 (fourteen) days.        . ritonavir (NORVIR) 100 MG TABS Take 1 tablet (100 mg total) by mouth daily.  30 tablet  11   No current facility-administered medications on file prior to visit.     Review of Systems   Constitutional: Negative for fever, chills, diaphoresis, activity change, appetite change, fatigue and unexpected weight change.  HENT: Negative for congestion, sore throat, rhinorrhea, sneezing, trouble swallowing and sinus pressure.  Eyes: Negative for photophobia and visual disturbance.  Respiratory: + chronic cough. Negative for cough, chest tightness, shortness of breath, wheezing and stridor.  Cardiovascular: Negative for chest pain, palpitations and leg swelling.  Gastrointestinal: Negative for nausea, vomiting, abdominal pain, diarrhea, constipation, blood in stool, abdominal distention and anal bleeding.  Genitourinary: Negative for dysuria, hematuria, flank pain and difficulty urinating.  Musculoskeletal: Negative for myalgias, back pain, joint swelling, arthralgias and gait problem.  Skin:  Negative for color change, pallor, rash and wound.  Neurological: Negative for dizziness, tremors, weakness and light-headedness.  Hematological: Negative for adenopathy. Does not bruise/bleed easily.  Psychiatric/Behavioral: Negative for behavioral problems, confusion, sleep disturbance, dysphoric mood, decreased concentration and agitation.       Objective:   Physical Exam BP 126/82  Pulse 75  Temp(Src) 97.4 F (36.3 C) (Oral)  Wt 147 lb (66.679 kg)  BMI 29.67 kg/m2 Physical Exam  Constitutional:  oriented to person, place, and time. appears  well-developed and well-nourished. No distress.  HENT:  Mouth/Throat: Oropharynx is clear and moist. No oropharyngeal exudate.  Cardiovascular: Normal rate, regular rhythm and normal heart sounds. Exam reveals no gallop and no friction rub.  No murmur heard.  Pulmonary/Chest: Effort normal and breath sounds normal. No respiratory distress. has no wheezes.  Abdominal: Soft. Bowel sounds are normal. exhibits no distension. There is no tenderness.  Lymphadenopathy:  no cervical adenopathy.  Neurological:  alert and oriented to person, place, and time.  Skin: Skin is warm and dry. No rash noted. No erythema.  Psychiatric: a normal mood and affect. His behavior is normal.        Assessment & Plan:  HIV = will continue raltegravir/truvada/DRVr  HBV = will need to check hbv viral load  Health maintenance = will need to get reepat mammo of right breast in December  Smoker's cough/COPD = doing well with albuterol  Smoking cessation = discussed decreasing from 1ppd  rtc in 3 months

## 2012-12-17 ENCOUNTER — Other Ambulatory Visit: Payer: Self-pay | Admitting: *Deleted

## 2012-12-17 DIAGNOSIS — J45909 Unspecified asthma, uncomplicated: Secondary | ICD-10-CM

## 2012-12-17 MED ORDER — ALBUTEROL SULFATE (2.5 MG/3ML) 0.083% IN NEBU: 2.5000 mg | INHALATION_SOLUTION | Freq: Four times a day (QID) | RESPIRATORY_TRACT | Status: DC | PRN

## 2012-12-17 MED ORDER — ALBUTEROL SULFATE HFA 108 (90 BASE) MCG/ACT IN AERS: 2.0000 | INHALATION_SPRAY | Freq: Four times a day (QID) | RESPIRATORY_TRACT | Status: DC | PRN

## 2013-01-14 ENCOUNTER — Ambulatory Visit: Payer: Medicaid Other | Admitting: Internal Medicine

## 2013-01-14 ENCOUNTER — Other Ambulatory Visit: Payer: Medicaid Other

## 2013-01-28 ENCOUNTER — Ambulatory Visit: Payer: Medicaid Other | Admitting: Internal Medicine

## 2013-01-29 ENCOUNTER — Ambulatory Visit: Payer: Medicaid Other | Admitting: Internal Medicine

## 2013-01-30 ENCOUNTER — Telehealth: Payer: Self-pay | Admitting: *Deleted

## 2013-01-30 NOTE — Telephone Encounter (Signed)
Kathlene November called, "I just received a bill for $49 and it says denied Medicaid coverage and all she has is Medicaid and it always worked before."

## 2013-01-31 NOTE — Telephone Encounter (Signed)
[name] SENDING MESSAGE TO BILLING DEPT °

## 2013-02-05 ENCOUNTER — Ambulatory Visit: Payer: Medicaid Other | Admitting: Internal Medicine

## 2013-02-06 ENCOUNTER — Other Ambulatory Visit: Payer: Self-pay | Admitting: *Deleted

## 2013-02-06 DIAGNOSIS — B20 Human immunodeficiency virus [HIV] disease: Secondary | ICD-10-CM

## 2013-02-06 MED ORDER — RALTEGRAVIR POTASSIUM 400 MG PO TABS: ORAL_TABLET | ORAL | Status: DC

## 2013-02-06 MED ORDER — EMTRICITABINE-TENOFOVIR DF 200-300 MG PO TABS: ORAL_TABLET | ORAL | Status: DC

## 2013-02-11 ENCOUNTER — Encounter (HOSPITAL_COMMUNITY): Payer: Self-pay | Admitting: Emergency Medicine

## 2013-02-11 ENCOUNTER — Emergency Department (HOSPITAL_COMMUNITY)
Admission: EM | Admit: 2013-02-11 | Discharge: 2013-02-11 | Disposition: A | Discharge status: Home / Self Care | Payer: Medicaid Other | Attending: Emergency Medicine | Admitting: Emergency Medicine

## 2013-02-11 ENCOUNTER — Emergency Department (HOSPITAL_COMMUNITY): Payer: Medicaid Other

## 2013-02-11 ENCOUNTER — Telehealth: Payer: Self-pay | Admitting: *Deleted

## 2013-02-11 ENCOUNTER — Ambulatory Visit: Payer: Medicaid Other | Admitting: Internal Medicine

## 2013-02-11 DIAGNOSIS — Z9089 Acquired absence of other organs: Secondary | ICD-10-CM | POA: Insufficient documentation

## 2013-02-11 DIAGNOSIS — Z8669 Personal history of other diseases of the nervous system and sense organs: Secondary | ICD-10-CM | POA: Insufficient documentation

## 2013-02-11 DIAGNOSIS — G40909 Epilepsy, unspecified, not intractable, without status epilepticus: Secondary | ICD-10-CM | POA: Insufficient documentation

## 2013-02-11 DIAGNOSIS — J441 Chronic obstructive pulmonary disease with (acute) exacerbation: Secondary | ICD-10-CM | POA: Insufficient documentation

## 2013-02-11 DIAGNOSIS — F209 Schizophrenia, unspecified: Secondary | ICD-10-CM | POA: Insufficient documentation

## 2013-02-11 DIAGNOSIS — R000 Tachycardia, unspecified: Secondary | ICD-10-CM | POA: Insufficient documentation

## 2013-02-11 DIAGNOSIS — F17200 Nicotine dependence, unspecified, uncomplicated: Secondary | ICD-10-CM | POA: Insufficient documentation

## 2013-02-11 DIAGNOSIS — Z79899 Other long term (current) drug therapy: Secondary | ICD-10-CM | POA: Insufficient documentation

## 2013-02-11 DIAGNOSIS — R0600 Dyspnea, unspecified: Secondary | ICD-10-CM

## 2013-02-11 DIAGNOSIS — Z21 Asymptomatic human immunodeficiency virus [HIV] infection status: Secondary | ICD-10-CM | POA: Insufficient documentation

## 2013-02-11 DIAGNOSIS — Z8619 Personal history of other infectious and parasitic diseases: Secondary | ICD-10-CM | POA: Insufficient documentation

## 2013-02-11 DIAGNOSIS — Z8719 Personal history of other diseases of the digestive system: Secondary | ICD-10-CM | POA: Insufficient documentation

## 2013-02-11 LAB — BLOOD GAS, ARTERIAL
Acid-base deficit: 0.4 mmol/L (ref 0.0–2.0)
Bicarbonate: 24.7 mEq/L — ABNORMAL HIGH (ref 20.0–24.0)
O2 Saturation: 96.9 %
Patient temperature: 98.6
TCO2: 22.2 mmol/L (ref 0–100)

## 2013-02-11 LAB — CBC WITH DIFFERENTIAL/PLATELET
Basophils Absolute: 0 10*3/uL (ref 0.0–0.1)
Basophils Relative: 1 % (ref 0–1)
Eosinophils Absolute: 0.2 10*3/uL (ref 0.0–0.7)
MCH: 30.8 pg (ref 26.0–34.0)
MCHC: 34.7 g/dL (ref 30.0–36.0)
Neutro Abs: 3.9 10*3/uL (ref 1.7–7.7)
Neutrophils Relative %: 62 % (ref 43–77)
Platelets: 193 10*3/uL (ref 150–400)
RDW: 14.4 % (ref 11.5–15.5)

## 2013-02-11 LAB — COMPREHENSIVE METABOLIC PANEL
ALT: 15 U/L (ref 0–35)
AST: 21 U/L (ref 0–37)
Albumin: 3.9 g/dL (ref 3.5–5.2)
Alkaline Phosphatase: 105 U/L (ref 39–117)
BUN: 8 mg/dL (ref 6–23)
Potassium: 3.7 mEq/L (ref 3.5–5.1)
Sodium: 130 mEq/L — ABNORMAL LOW (ref 135–145)
Total Protein: 7.7 g/dL (ref 6.0–8.3)

## 2013-02-11 LAB — POCT I-STAT TROPONIN I: Troponin i, poc: 0.01 ng/mL (ref 0.00–0.08)

## 2013-02-11 LAB — LACTATE DEHYDROGENASE: LDH: 244 U/L (ref 94–250)

## 2013-02-11 MED ORDER — ALBUTEROL (5 MG/ML) CONTINUOUS INHALATION SOLN
10.0000 mg/h | INHALATION_SOLUTION | RESPIRATORY_TRACT | Status: AC
  Administered 2013-02-11: 10 mg/h via RESPIRATORY_TRACT
  Filled 2013-02-11: qty 20

## 2013-02-11 MED ORDER — PREDNISONE 10 MG PO TABS: 40.0000 mg | ORAL_TABLET | Freq: Every day | ORAL | Status: AC

## 2013-02-11 MED ORDER — ALBUTEROL SULFATE (5 MG/ML) 0.5% IN NEBU: 5.0000 mg | INHALATION_SOLUTION | RESPIRATORY_TRACT | Status: DC | PRN

## 2013-02-11 NOTE — ED Notes (Signed)
Respiratory at bedside.

## 2013-02-11 NOTE — ED Provider Notes (Signed)
CSN: [id number]     Arrival date & time 02/11/13  1603 History   First MD Initiated Contact with Patient 02/11/13 1604     Chief Complaint  Patient presents with  . Shortness of Breath   (Consider location/radiation/quality/duration/timing/severity/associated sxs/prior Treatment) HPI Patient presents with dyspnea, generalized discomfort. Symptoms began 2 days ago, without clear perspective. Since onset she states that she continues to smoke many cigarettes, but has had no relief with using her typical albuterol medication. She denies concurrent fevers, vomiting, chest pain, abdominal pain, diarrhea. She states that she is compliant with all medication. HPI is limited 2/2 the acuity of the patient's condition on arrival - respiratory distress.  Past Medical History  Diagnosis Date  . GERD (gastroesophageal reflux disease)   . Schizophrenia   . AIDS 12-10-2007  . Herpes simplex   . History of cholecystectomy   . Cataract   . Nonspecific reaction to tuberculin skin test without active tuberculosis 11-2008 [facility]   . Asthma   . Seizures   . COPD (chronic obstructive pulmonary disease)   . Hepatitis B infection     followed by ID   Past Surgical History  Procedure Laterality Date  . Cholecystectomy    . Leg surgery      right leg, s/p accident   Family History  Problem Relation Age of Onset  . Diabetes Sister   . Cancer Sister     breast  . Diabetes Brother   . Hypertension Brother   . Cancer Mother     lung  . Hypertension Mother   . Heart disease Father    History  Substance Use Topics  . Smoking status: Current Every Day Smoker -- 1.00 packs/day for 35 years    Types: Cigarettes  . Smokeless tobacco: Never Used     Comment: pt states 'not ready yet'  . Alcohol Use: No   OB History   Grav Para Term Preterm Abortions TAB SAB Ect Mult Living                 Review of Systems  Constitutional:       Per HPI, otherwise negative  HENT:       Per HPI, otherwise  negative  Respiratory:       Per HPI, otherwise negative  Cardiovascular:       Per HPI, otherwise negative  Gastrointestinal: Negative for vomiting.  Endocrine:       Negative aside from HPI  Genitourinary:       Neg aside from HPI   Musculoskeletal:       Per HPI, otherwise negative  Skin: Negative.   Neurological: Negative for syncope.    Allergies  Review of patient's allergies indicates no known allergies.  Home Medications   Current Outpatient Rx  Name  Route  Sig  Dispense  Refill  . albuterol (PROVENTIL HFA;VENTOLIN HFA) 108 (90 BASE) MCG/ACT inhaler   Inhalation   Inhale 2 puffs into the lungs every 6 (six) hours as needed. For shortness of breath   1 Inhaler   2   . albuterol (PROVENTIL) (2.5 MG/3ML) 0.083% nebulizer solution   Nebulization   Take 3 mLs (2.5 mg total) by nebulization every 6 (six) hours as needed for wheezing.   75 mL   2   . benztropine (COGENTIN) 0.5 MG tablet   Oral   Take 0.5 mg by mouth 2 (two) times daily.         . carbamazepine (TEGRETOL) 200  MG tablet   Oral   Take 200-400 mg by mouth 2 (two) times daily. Take one tablet in the morning and two tablets at bedtime         . Cephalexin 500 MG tablet   Oral   Take 1 tablet (500 mg total) by mouth 3 (three) times daily. For 7 days   21 tablet   0   . Darunavir Ethanolate (PREZISTA) 800 MG tablet   Oral   Take 1 tablet (800 mg total) by mouth daily with breakfast.   30 tablet   11   . Dextromethorphan-Guaifenesin (Q-TUSSIN DM) 10-100 MG/5ML liquid   Oral   Take 5 mLs by mouth 3 (three) times daily as needed.         Marland Kitchen emtricitabine-tenofovir (TRUVADA) 200-300 MG per tablet      TAKE 1 TABLET BY MOUTH DAILY.   30 tablet   6   . QUEtiapine (SEROQUEL) 50 MG tablet   Oral   Take 50 mg by mouth at bedtime.         . raltegravir (ISENTRESS) 400 MG tablet      TAKE 1 TABLET BY MOUTH 2 TIMES DAILY.   60 tablet   6   . risperiDONE (RISPERDAL) 1 MG tablet   Oral    Take 1 mg by mouth daily at 12 noon.         . risperidone (RISPERDAL) 4 MG tablet   Oral   Take 4 mg by mouth at bedtime.           . risperiDONE microspheres (RISPERDAL CONSTA) 50 MG injection   Intramuscular   Inject 50 mg into the muscle every 14 (fourteen) days.           . ritonavir (NORVIR) 100 MG TABS   Oral   Take 1 tablet (100 mg total) by mouth daily.   30 tablet   11    SpO2 89% Physical Exam  Nursing note and vitals reviewed. Constitutional: She is oriented to person, place, and time. She appears well-developed and well-nourished. She appears distressed.  HENT:  Head: Normocephalic and atraumatic.  Eyes: Conjunctivae and EOM are normal.  Cardiovascular: Regular rhythm.  Tachycardia present.   Pulmonary/Chest: Accessory muscle usage present. No stridor. Tachypnea noted. She is in respiratory distress. She has decreased breath sounds. She has wheezes.  Abdominal: She exhibits no distension.  Musculoskeletal: She exhibits no edema.  Neurological: She is alert and oriented to person, place, and time. No cranial nerve deficit.  Skin: Skin is warm. She is diaphoretic.  Psychiatric: She has a normal mood and affect.    ED Course  Procedures (including critical care time) Labs Review Labs Reviewed  CBC WITH DIFFERENTIAL  COMPREHENSIVE METABOLIC PANEL  URINALYSIS, ROUTINE W REFLEX MICROSCOPIC  LACTATE DEHYDROGENASE  BLOOD GAS, VENOUS   Imaging Review No results found.  EKG Interpretation   None      O2- 88%ra, abnormal  Cardiac: 90 reg, nml   6:57 PM Patient states that she feels markedly better.  Lung sounds are improved, VS improved.  She appears more comfortable.   MDM  This patient with MMP, including HIV, COPD, smoking, now p/w dyspnea.  She is initially dyspneic, hypoxic, and uncomfortable appearing. Following interventions (steroids, O2, nebs) the patient was substantially improved.  Labs, CXR reassuring.  With the patient's improvement  and these reassuring findings, there is little suspicion for acute new systemic illness.     Gerhard Munch, MD 02/11/13  1938 

## 2013-02-11 NOTE — ED Notes (Signed)
Pt treatment completed. Pt bilateral expiratory wheezing. 85% RA. 87% of 2 lpm [place]. 88% on 4 lpm [place]. NRB placed on pt and EDP notified.

## 2013-02-11 NOTE — ED Notes (Signed)
Pt dressed at states ready to go home because it is 730 and says she feels better,  Pt alert and oriented in NAD.   I spoke with her sister per her request,  (Bridgette)

## 2013-02-11 NOTE — ED Notes (Signed)
Pt O2 sat on NRB 94-96%.

## 2013-02-11 NOTE — ED Notes (Signed)
Per pt has had SOB x2 days unrelieved by home albuterol treatments. Pt reports "cough and lower back pain for a couple of days." Pt audible wheezing. EMS administered 10 mg of albuterol and 0.5 mg of Atrovent.

## 2013-02-11 NOTE — ED Notes (Signed)
Respiratory contacted.

## 2013-02-11 NOTE — ED Notes (Signed)
In attempt to draw blood, x1 attempt by this RN, x3 by phlebotomist who was able to collect some labs, and x1 by charge RN who was able to collect some labs with use of ultrasound assistance. No successful IV insertion with blood draw.

## 2013-02-11 NOTE — ED Notes (Signed)
Pt placed on [place] 2lpm and sat 97-100%. Pt given ginger ale per pt request and EDP, [place] okay.

## 2013-02-11 NOTE — ED Notes (Signed)
Bed: WA23 Expected date:  Expected time:  Means of arrival:  Comments: SOB 

## 2013-02-11 NOTE — Telephone Encounter (Signed)
Requested pt call [facility] for another appt.

## 2013-02-11 NOTE — ED Notes (Signed)
Report received from [facility],  Pt is here for sob and she is feeling better and should be discharged home shortly

## 2013-02-17 ENCOUNTER — Ambulatory Visit: Payer: Medicaid Other | Admitting: Family Medicine

## 2013-02-18 ENCOUNTER — Encounter: Payer: Self-pay | Admitting: Family Medicine

## 2013-02-18 ENCOUNTER — Ambulatory Visit (INDEPENDENT_AMBULATORY_CARE_PROVIDER_SITE_OTHER): Payer: Medicaid Other | Admitting: Family Medicine

## 2013-02-18 VITALS — BP 99/60 | HR 80 | Temp 99.6°F | Wt 149.0 lb

## 2013-02-18 DIAGNOSIS — J449 Chronic obstructive pulmonary disease, unspecified: Secondary | ICD-10-CM | POA: Insufficient documentation

## 2013-02-18 DIAGNOSIS — F17200 Nicotine dependence, unspecified, uncomplicated: Secondary | ICD-10-CM

## 2013-02-18 DIAGNOSIS — Z720 Tobacco use: Secondary | ICD-10-CM

## 2013-02-18 MED ORDER — NICOTINE 21 MG/24HR TD PT24: 21.0000 mg | MEDICATED_PATCH | Freq: Every day | TRANSDERMAL | Status: DC

## 2013-02-18 MED ORDER — TIOTROPIUM BROMIDE MONOHYDRATE 18 MCG IN CAPS: 18.0000 ug | ORAL_CAPSULE | Freq: Every day | RESPIRATORY_TRACT | Status: DC

## 2013-02-18 MED ORDER — PREDNISONE (PAK) 10 MG PO TABS: ORAL_TABLET | ORAL | Status: DC

## 2013-02-18 NOTE — Assessment & Plan Note (Signed)
Patient is interested in smoking cessation today. -Nicotine patch prescribed -Patient to follow up with Dr. Pollie Meyer in 2-4 weeks to follow up on breathing and smoking cessation.

## 2013-02-18 NOTE — Patient Instructions (Signed)

## 2013-02-18 NOTE — Progress Notes (Signed)
  Subjective:    Patient ID: [name], female    DOB: [birth year], 52 y.o.   MRN: [id number]  HPI 52 y/o female with PMH of HIV, Asthma, COPD, Schizophrenia presents for ER follow up for dyspnea. She was seen and evaluated on 10/4 for dyspnea which improved with steroids and breathing treatments in the ER, CXR and labwork was unremarkable, patient discharged home with 5 day steroid burst, her symptoms have been gradually improving however she continues to have intermittent sob and productive cough, no fevers or chills, no abdominal pain, no nausea or vomiting, has associated runny nose, congestion, and mild sore throat which have been improving, taking albuterol a few times per day, has been smoking 2 PPD for over 35 years, she is interested in quitting smoking at this time, would like to use nicotine patch   Review of Systems  Constitutional: Negative for fever and chills.  Respiratory: Positive for cough. Negative for chest tightness.   Cardiovascular: Negative for chest pain.  Gastrointestinal: Negative for nausea, diarrhea and abdominal distention.       Objective:   Physical Exam Vitals: Reviewed Gen: pleasant, AAF HEENT: bilateral TM's obscurred cerumen, PERRL, EOMI, no scleral icterus, rhinorrhea present, MMM, no pharyngeal erythema or exudate noted, neck supple Cardiac: RRR, S1 and S2 present, no murmurs, no heaves/thrills Resp: rhonchi in bilateral lungs, expiratory wheezes present Skin: no rash  Reviewed CXR and lab work from recent ER visit.  CD4 count 570 in July 2014     Assessment & Plan:  Please see problem specific assessment and plan.

## 2013-02-18 NOTE — Assessment & Plan Note (Addendum)
Hospital Follow up of COPD exacerbation. Symptoms improving however still significant sob/cough. Given her diagnosis of HIV PCP is in the differential however given negative CXR, normal labwork, and CD4 count above 500 three months ago PCP is less likely  -Prescribed steroid taper -Continue albuterol prn, refill provided -Initiated Spiriva in addition to albuterol -Considered Levaquin however given risk of QT prolongation along with negative CXR/no signs/symptoms of PNA elected to not prescribe antibiotics.

## 2013-03-11 ENCOUNTER — Other Ambulatory Visit: Payer: Self-pay | Admitting: Licensed Clinical Social Worker

## 2013-03-11 ENCOUNTER — Other Ambulatory Visit: Payer: Medicaid Other

## 2013-03-11 DIAGNOSIS — J45909 Unspecified asthma, uncomplicated: Secondary | ICD-10-CM

## 2013-03-11 MED ORDER — ALBUTEROL SULFATE (2.5 MG/3ML) 0.083% IN NEBU: 2.5000 mg | INHALATION_SOLUTION | Freq: Four times a day (QID) | RESPIRATORY_TRACT | Status: DC | PRN

## 2013-03-11 MED ORDER — ALBUTEROL SULFATE HFA 108 (90 BASE) MCG/ACT IN AERS: 2.0000 | INHALATION_SPRAY | Freq: Four times a day (QID) | RESPIRATORY_TRACT | Status: DC | PRN

## 2013-03-18 ENCOUNTER — Other Ambulatory Visit: Payer: Medicaid Other

## 2013-03-18 ENCOUNTER — Ambulatory Visit (INDEPENDENT_AMBULATORY_CARE_PROVIDER_SITE_OTHER): Payer: Medicaid Other | Admitting: *Deleted

## 2013-03-18 DIAGNOSIS — Z23 Encounter for immunization: Secondary | ICD-10-CM

## 2013-03-18 DIAGNOSIS — B20 Human immunodeficiency virus [HIV] disease: Secondary | ICD-10-CM

## 2013-03-18 LAB — COMPREHENSIVE METABOLIC PANEL
ALT: 17 U/L (ref 0–35)
AST: 21 U/L (ref 0–37)
Albumin: 4.3 g/dL (ref 3.5–5.2)
Alkaline Phosphatase: 108 U/L (ref 39–117)
CO2: 25 mEq/L (ref 19–32)
Calcium: 9.3 mg/dL (ref 8.4–10.5)
Chloride: 98 mEq/L (ref 96–112)
Glucose, Bld: 85 mg/dL (ref 70–99)
Potassium: 4.6 mEq/L (ref 3.5–5.3)
Sodium: 133 mEq/L — ABNORMAL LOW (ref 135–145)
Total Bilirubin: 0.4 mg/dL (ref 0.3–1.2)
Total Protein: 7.7 g/dL (ref 6.0–8.3)

## 2013-03-18 LAB — CBC WITH DIFFERENTIAL/PLATELET
Basophils Absolute: 0 10*3/uL (ref 0.0–0.1)
Eosinophils Absolute: 0.1 10*3/uL (ref 0.0–0.7)
Eosinophils Relative: 2 % (ref 0–5)
Hemoglobin: 15.8 g/dL — ABNORMAL HIGH (ref 12.0–15.0)
Lymphocytes Relative: 45 % (ref 12–46)
Lymphs Abs: 2.2 10*3/uL (ref 0.7–4.0)
MCH: 31.9 pg (ref 26.0–34.0)
MCV: 91.1 fL (ref 78.0–100.0)
Monocytes Relative: 7 % (ref 3–12)
Neutrophils Relative %: 45 % (ref 43–77)
Platelets: 201 10*3/uL (ref 150–400)
RBC: 4.96 MIL/uL (ref 3.87–5.11)
WBC: 4.8 10*3/uL (ref 4.0–10.5)

## 2013-03-19 ENCOUNTER — Other Ambulatory Visit: Payer: Self-pay | Admitting: Internal Medicine

## 2013-03-19 DIAGNOSIS — N6489 Other specified disorders of breast: Secondary | ICD-10-CM

## 2013-03-19 LAB — T-HELPER CELL (CD4) - (RCID CLINIC ONLY)
CD4 % Helper T Cell: 27 % — ABNORMAL LOW (ref 33–55)
CD4 T Cell Abs: 500 /uL (ref 400–2700)

## 2013-03-19 LAB — HIV-1 RNA QUANT-NO REFLEX-BLD: HIV 1 RNA Quant: 36 copies/mL — ABNORMAL HIGH (ref <20)

## 2013-03-25 ENCOUNTER — Ambulatory Visit (INDEPENDENT_AMBULATORY_CARE_PROVIDER_SITE_OTHER): Payer: Medicaid Other | Admitting: Internal Medicine

## 2013-03-25 ENCOUNTER — Encounter: Payer: Self-pay | Admitting: Internal Medicine

## 2013-03-25 VITALS — BP 109/71 | HR 93 | Temp 97.9°F | Wt 153.0 lb

## 2013-03-25 DIAGNOSIS — J449 Chronic obstructive pulmonary disease, unspecified: Secondary | ICD-10-CM

## 2013-03-25 DIAGNOSIS — F17200 Nicotine dependence, unspecified, uncomplicated: Secondary | ICD-10-CM

## 2013-03-25 DIAGNOSIS — B20 Human immunodeficiency virus [HIV] disease: Secondary | ICD-10-CM

## 2013-03-25 DIAGNOSIS — Z720 Tobacco use: Secondary | ICD-10-CM

## 2013-03-25 DIAGNOSIS — Z21 Asymptomatic human immunodeficiency virus [HIV] infection status: Secondary | ICD-10-CM

## 2013-03-25 NOTE — Progress Notes (Signed)
Subjective:    Patient ID: [name], female    DOB: September 29, 1960, 52 y.o.   MRN: [id number]  HPI [name] is a 52yo F with HIV with schizophrenia and developmental delay, COPD with nicotine dependence, CD 4 count of 500/VL 36 currently on raltegravir/DRVr/truvada. Doing well with ART. She was hospitalized in November for pneumonia, presumed PCP/CAP, but stains negative, and CD 4 count low. She was placed on steroid taper and followed up by PCP who initiated her on spiriva.in addn to albuterol   Still has cravings for smoking. Currently her brother is Biomedical engineer of her cigs. Does 1 ppd. Wants to decrease, "quit like my brother". Doing well.  [place] is here with her health attendant. Current Outpatient Prescriptions on File Prior to Visit  Medication Sig Dispense Refill  . albuterol (PROVENTIL HFA;VENTOLIN HFA) 108 (90 BASE) MCG/ACT inhaler Inhale 2 puffs into the lungs every 6 (six) hours as needed. For shortness of breath  1 Inhaler  2  . albuterol (PROVENTIL) (2.5 MG/3ML) 0.083% nebulizer solution Take 3 mLs (2.5 mg total) by nebulization every 6 (six) hours as needed for wheezing.  75 mL  2  . benztropine (COGENTIN) 0.5 MG tablet Take 0.5 mg by mouth 2 (two) times daily.      . carbamazepine (TEGRETOL) 200 MG tablet Take 200-400 mg by mouth 2 (two) times daily. Take one tablet in the morning and two tablets at bedtime      . Darunavir Ethanolate (PREZISTA) 800 MG tablet Take 1 tablet (800 mg total) by mouth daily with breakfast.  30 tablet  11  . emtricitabine-tenofovir (TRUVADA) 200-300 MG per tablet TAKE 1 TABLET BY MOUTH DAILY.  30 tablet  6  . nicotine (NICODERM CQ) 21 mg/24hr patch Place 1 patch (21 mg total) onto the skin daily.  28 patch  1  . predniSONE (STERAPRED UNI-PAK) 10 MG tablet Take 4 tablets daily for 3 days, then 3 tablets daily for 3 days, then 2 tablets daily for 3 days, then 1 tablet daily for 3 days, then stop.  28 tablet  tablet  . QUEtiapine (SEROQUEL) 50 MG tablet Take 50 mg  by mouth at bedtime.      . raltegravir (ISENTRESS) 400 MG tablet TAKE 1 TABLET BY MOUTH 2 TIMES DAILY.  60 tablet  6  . risperiDONE (RISPERDAL) 1 MG tablet Take 1 mg by mouth daily at 12 noon.      . risperidone (RISPERDAL) 4 MG tablet Take 4 mg by mouth at bedtime.        . risperiDONE microspheres (RISPERDAL CONSTA) 50 MG injection Inject 50 mg into the muscle every 14 (fourteen) days.        . ritonavir (NORVIR) 100 MG TABS Take 1 tablet (100 mg total) by mouth daily.  30 tablet  11  . tiotropium (SPIRIVA HANDIHALER) 18 MCG inhalation capsule Place 1 capsule (18 mcg total) into inhaler and inhale daily.  30 capsule  3   No current facility-administered medications on file prior to visit.   Active Ambulatory Problems    Diagnosis Date Noted  . Asthma 08/18/2010  . HIV (human immunodeficiency virus infection) 08/18/2010  . Schizophrenia 08/18/2010  . Herpes simplex infection 08/18/2010  . Pre-ulcerative corn or callous 05/07/2012  . Urinary incontinence, nocturnal enuresis 05/07/2012  . Fecal incontinence 05/07/2012  . Systolic murmur 05/07/2012  . Routine health maintenance 05/07/2012  . Hepatitis B infection   . COPD (chronic obstructive pulmonary disease) 02/18/2013  . Tobacco  abuse 02/18/2013   Resolved Ambulatory Problems    Diagnosis Date Noted  . No Resolved Ambulatory Problems   Past Medical History  Diagnosis Date  . GERD (gastroesophageal reflux disease)   . AIDS 12-10-2007  . Herpes simplex   . History of cholecystectomy   . Cataract   . Nonspecific reaction to tuberculin skin test without active tuberculosis 11-2008 [facility]   . Asthma   . Seizures        Review of Systems 12 point ros reviewed, positive pertinents listed above. No fever, chills, nightsweats    Objective:   Physical Exam BP 109/71  Pulse 93  Temp(Src) 97.9 F (36.6 C) (Oral)  Wt 153 lb (69.4 kg)  SpO2 92% Physical Exam  Constitutional:  oriented to person, place, and time. appears  well-developed and well-nourished. No distress.  HENT:  Mouth/Throat: Oropharynx is clear and moist. No oropharyngeal exudate. edentulous  Cardiovascular: Normal rate, regular rhythm and normal heart sounds. Exam reveals no gallop and no friction rub.  No murmur heard.  Pulmonary/Chest: Effort normal and breath sounds normal. No respiratory distress.  no wheezes.  Abdominal: Soft. Bowel sounds are normal. He exhibits no distension. There is no tenderness.  Lymphadenopathy: no cervical adenopathy.  Neurological: alert and oriented to person, place, and time.  Skin: Skin is warm and dry. No rash noted. No erythema.  Psychiatric:  a normal mood and affect.  behavior is normal.          Assessment & Plan:  Copd/ashtma = seen by pcp. Dr. Randolm Idol added spiriva. Consider going to pulmonary to evaluate if worsening. Pfts,  Hv = stable, continue on current regimen. May consider dhanging to [facility] at next visit  Health maintenance = getting mammo this week. Received flu

## 2013-03-28 ENCOUNTER — Ambulatory Visit
Admission: RE | Admit: 2013-03-28 | Discharge: 2013-03-28 | Disposition: A | Discharge status: Home / Self Care | Payer: Medicaid Other | Source: Ambulatory Visit | Attending: Internal Medicine | Admitting: Internal Medicine

## 2013-03-28 DIAGNOSIS — N6489 Other specified disorders of breast: Secondary | ICD-10-CM

## 2013-04-08 ENCOUNTER — Telehealth: Payer: Self-pay | Admitting: Family Medicine

## 2013-04-08 NOTE — Telephone Encounter (Signed)
Patient needs to schedule an appointment to see me. I have only seen her once at our clinic, at her new patient appointment in January. Why does she need a referral to podiatry? She has been seen there before and thus should be able to schedule her own visit. Please ask patient.  [name], MD

## 2013-04-08 NOTE — Telephone Encounter (Signed)
Patient request a referral to [facility]. Patient has an appt on 1/13 and needs referral before then.

## 2013-04-08 NOTE — Telephone Encounter (Signed)
I will FWD this message to Marines, referral coordinator for clarification on referral.  I see a referral in place from 04/04/2013.  Gopal Malter, Darlyne Russian, CMA

## 2013-04-09 NOTE — Telephone Encounter (Signed)
Called pt. She has appt at the foot ctr 04/22/13. She will sched. OV with PCP next month. Lorenda Hatchet, Renato Battles

## 2013-04-09 NOTE — Telephone Encounter (Signed)
FWD to [place].  Pt has an appt on 04/22/2013 WO did the referral for pt is not a reason for another referral if pt need to see podiatry before need to call foot center and [facility].  Marines

## 2013-04-09 NOTE — Telephone Encounter (Signed)
Spoke with patient and she changed her mind and will keep appt on January 13th.   Cordel Drewes, Darlyne Russian, CMA

## 2013-04-22 ENCOUNTER — Ambulatory Visit (INDEPENDENT_AMBULATORY_CARE_PROVIDER_SITE_OTHER): Payer: Medicaid Other

## 2013-04-22 ENCOUNTER — Telehealth: Payer: Self-pay | Admitting: *Deleted

## 2013-04-22 VITALS — BP 121/58 | HR 80 | Resp 16 | Ht 59.0 in | Wt 123.0 lb

## 2013-04-22 DIAGNOSIS — Q828 Other specified congenital malformations of skin: Secondary | ICD-10-CM

## 2013-04-22 DIAGNOSIS — L84 Corns and callosities: Secondary | ICD-10-CM

## 2013-04-22 NOTE — Telephone Encounter (Signed)
Patient called and left a message that she was running late for her appointment but she was in route.

## 2013-04-22 NOTE — Progress Notes (Signed)
   Subjective:    Patient ID: [name], female    DOB: 04/05/1961, 53 y.o.   MRN: [id number]  HPI Comments: Callus removed right foot     Review of Systems no new changes or findings noted     Objective:   Physical Exam Vascular status appears to be intact pedal pulses palpable DP postal for PT plus one over 4 bilateral Refill timed 3-4 seconds all digits neurologically epicritic and proprioceptive sensations intact there is normal plantar response DTRs not listed there is nucleated keratotic lesion sub-fifth MTP area right foot painful on direct lateral compression there is no pinpoint bleeding cannot rule out verruca versus porokeratosis. No open wounds or ulcers noted current time. Left foot is unremarkable nail somewhat criptotic of the patient is doing her own nail care.       Assessment & Plan:  Assessment this time painful porokeratosis or verrucoid type lesion sub-fifth MTP area right foot plan at this time lesion is debrided patient is wearing some shoes and Plastizote inlays to accommodate or offload the area will continue to do so as instructed debridement of painful porokeratosis/pre-ulcerative keratoses right foot return for future palliative care and as-needed basis maintain accommodative shoes and cushion shoes at all times debridement of keratoses is likely a noncovered service  Alvan Dameichard Shalev Helminiak DPM

## 2013-04-22 NOTE — Patient Instructions (Signed)
Warts Warts are a common viral infection. They are most commonly caused by the human papillomavirus (HPV). Warts can occur at all ages. However, they occur most frequently in older children and infrequently in the elderly. Warts may be single or multiple. Location and size varies. Warts can be spread by scratching the wart and then scratching normal skin. The life cycle of warts varies. However, most will disappear over many months to a couple years. Warts commonly do not cause problems (asymptomatic) unless they are over an area of pressure, such as the bottom of the foot. If they are large enough, they may cause pain with walking. DIAGNOSIS  Warts are most commonly diagnosed by their appearance. Tissue samples (biopsies) are not required unless the wart looks abnormal. Most warts have a rough surface, are round, oval, or irregular, and are skin-colored to light yellow, brown, or gray. They are generally less than  inch (1.3 cm), but they can be any size. TREATMENT   Observation or no treatment.  Freezing with liquid nitrogen.  High heat (cautery).  Boosting the body's immunity to fight off the wart (immunotherapy using Candida antigen).  Laser surgery.  Application of various irritants and solutions. HOME CARE INSTRUCTIONS  Follow your caregiver's instructions. No special precautions are necessary. Often, treatment may be followed by a return (recurrence) of warts. Warts are generally difficult to treat and get rid of. If treatment is done in a clinic setting, usually more than 1 treatment is required. This is usually done on only a monthly basis until the wart is completely gone. SEEK IMMEDIATE MEDICAL CARE IF: The treated skin becomes red, puffy (swollen), or painful. Document Released: 01/04/2005 Document Revised: 07/22/2012 Document Reviewed: 07/02/2009 ExitCare Patient Information 2014 [employer].  

## 2013-04-30 ENCOUNTER — Ambulatory Visit (INDEPENDENT_AMBULATORY_CARE_PROVIDER_SITE_OTHER): Payer: Medicaid Other | Admitting: Family Medicine

## 2013-04-30 ENCOUNTER — Encounter: Payer: Self-pay | Admitting: Family Medicine

## 2013-04-30 VITALS — BP 126/75 | HR 62 | Temp 98.1°F | Ht 59.0 in | Wt 155.2 lb

## 2013-04-30 DIAGNOSIS — N3944 Nocturnal enuresis: Secondary | ICD-10-CM

## 2013-04-30 DIAGNOSIS — F17200 Nicotine dependence, unspecified, uncomplicated: Secondary | ICD-10-CM

## 2013-04-30 DIAGNOSIS — Z720 Tobacco use: Secondary | ICD-10-CM

## 2013-04-30 DIAGNOSIS — J449 Chronic obstructive pulmonary disease, unspecified: Secondary | ICD-10-CM

## 2013-04-30 NOTE — Patient Instructions (Signed)
It was great to see you again today!  Please schedule an appointment with Dr. Raymondo BandKoval in pharmacy clinic [facility] to discuss options for quitting smoking.  Use your albuterol only as needed. Use the spiriva every day. Follow up with me in 1 month to see how often you are needing the albuterol.  Be well, Dr. Pollie MeyerMcIntyre

## 2013-04-30 NOTE — Progress Notes (Signed)
Patient ID: [name], female   DOB: 11/01/1960, 53 y.o.   MRN: [id number]  HPI:  Smoking: smokes 1 ppd since she was 53 years old. Her brother keeps her cigarettes and only gives her one pack per day in order to keep her from smoking more. She has tried to quit cold [place] before and couldn't do it. She identifies her motivation to quit as having COPD and recently noticing worsening in her breathing (had to go to ER once). Her brother is on oxygen from COPD and she doesn't want to end up on oxygen. Also her mother died from lung cancer. She goes outside to smoke. Has never tried any medicines to quit before but wants to try something now as long as Medicaid will cover it.  COPD: uses nebulizer every day and albuterol inhaler every day to keep her lungs from worsening. Using spiriva every day as well. Did not realize that she is supposed to use albuterol only as needed. Denies chest pain, swelling in her legs, or shortness of breath. States that overall she feels well and wants to keep feeling well.  Urinary incontinence: continues to require diapers at night due to incontinence. Does not need them during the day. Denies being incontinent of stool.  ROS: See HPI  PMFSH: hx HIV & Hep B (followed by ID), schizophrenia (has ACT team), COPD, tobacco abuse  PHYSICAL EXAM: BP 126/75  Pulse 62  Temp(Src) 98.1 F (36.7 C) (Oral)  Ht 4\' 11"  (1.499 m)  Wt 155 lb 3.2 oz (70.398 kg)  BMI 31.33 kg/m2 Gen: NAD HEENT: NCAT Heart: RRR, 2/6 early systolic murmur present Lungs: CTAB, NWOB Neuro: grossly nonfocal, speech normal Psych: normal range of affect, well groomed, speech slightly melodic but normal in rate and volume, does not appear to respond to internal stimuli  ASSESSMENT/PLAN:  See problem based charting for assessment/plan.  FOLLOW UP: F/u with Dr. Raymondo Hensley in pharmacy clinic for smoking cessation pharmacotherapy F/u in 1 month with me for COPD  Jackie J. Pollie MeyerMcIntyre, MD [facility]

## 2013-05-04 NOTE — Assessment & Plan Note (Signed)
[facility] renewing pt's supply of nighttime diapers.

## 2013-05-04 NOTE — Assessment & Plan Note (Signed)
Congratulated pt on her decision to quit smoking. As she is requesting pharmacologic therapy to aid in quitting and she is on multiple specialist-prescribed medicines for schizophrenia and HIV, [facility] Dr. Raymondo BandKoval in pharmacy clinic to discuss medication options for smoking cessation. Precepted with Dr. Gwendolyn GrantWalden who agrees with this plan.

## 2013-05-04 NOTE — Assessment & Plan Note (Signed)
Sx's seem well controlled at present, but is using albuterol on scheduled basis instead of prn. See below re: decision to quit smoking. Will have pt use albuterol prn instead of scheduled, and f/u with me in several weeks to monitor her symptoms. Continue spiriva at this time.

## 2013-05-15 ENCOUNTER — Ambulatory Visit (INDEPENDENT_AMBULATORY_CARE_PROVIDER_SITE_OTHER): Payer: Medicaid Other | Admitting: Pharmacist

## 2013-05-15 ENCOUNTER — Encounter: Payer: Self-pay | Admitting: Pharmacist

## 2013-05-15 VITALS — BP 126/82 | Wt 156.0 lb

## 2013-05-15 DIAGNOSIS — Z720 Tobacco use: Secondary | ICD-10-CM

## 2013-05-15 DIAGNOSIS — F17200 Nicotine dependence, unspecified, uncomplicated: Secondary | ICD-10-CM

## 2013-05-15 NOTE — Assessment & Plan Note (Signed)
severe Nicotine Dependence in a patient with concomitant mental illness who has been smoking since the age of 53 who is poor candidate for long-term success b/c of mental illness (currently appears to be controlled) AND lack of support with quitting.    She does appear to be in the action phase for cutting down and potentially quitting in the near future.  Initiated nicotine replacement tx 21mg  patch daily. Patient counseled on purpose, proper use, and potential adverse effects, including irritation.  Patient agreed to cut down to 10 cigarettes or less in the short-term.  She is unwilling to completely abstain currently however is open to cutting back by 50% today and then she would consider complete cessation.    Following phone call to [facility] [address] a product that was covered by Medicaid was identified.  The rugby product was ordered AND will be available tomorrow.  Patient will find $3 for the Rx.  Patient willing to commit to short-term goal of smoking 10 cigs or less per day after staring patch.   We will set a more significant goal (possibly complete cessation) if she can achieve the first goal.   Written information provided.  F/U Rx Clinic Visit  After visit scheduled with Dr. Pollie MeyerMcIntyre.   Total time in face-to-face counseling 30 minutes.  Patient seen with Dr. Anselm LisMelanie Marsh.

## 2013-05-15 NOTE — Progress Notes (Signed)
Patient ID: [name], female   DOB: 01/20/1961, 53 y.o.   MRN: [id number] Reviewed: Agree with Dr. Macky LowerKoval's management and documentation.

## 2013-05-15 NOTE — Patient Instructions (Addendum)
Your patch will be ready to pick up tomorrow afternoon.   Start Nicotine patch 21mg  - put one on daily.   Remove the next morning.   Smoke 10 cigarettes or less per day.   You goal is to quit completely by your birthday 09/09/2014!!!!  Next visit with Dr. Pollie MeyerMcIntyre at the end of the month.

## 2013-05-15 NOTE — Progress Notes (Signed)
S:  Patient arrives speaking very loudly and stating she has COPD and needs to quit smoking.  She coughs numerous time during the intake process.   Patient arrives for evaluation/assistance with tobacco dependence stating she cannot get patches as "Medicaid does NOT cover patches".   Age when started using tobacco on a daily basis 14. Number of Cigarettes per day maximally 2ppd but has cut down to 1 ppd as that is all she is given by her brother.  Brand smoked Pal Mal. Estimated Nicotine Content per Cigarette (mg) 1mg  per cigarette (denies routine use of filterless cigarette).  Estimated Nicotine intake per day 20 mg.   Triggers to use tobacco include; Stress and mental illness.      A/P: severe Nicotine Dependence in a patient with concomitant mental illness who has been smoking since the age of 53 who is poor candidate for long-term success b/c of mental illness (currently appears to be controlled) AND lack of support with quitting.    She does appear to be in the action phase for cutting down and potentially quitting in the near future.  Initiated nicotine replacement tx 21mg  patch daily. Patient counseled on purpose, proper use, and potential adverse effects, including irritation.  Patient agreed to cut down to 10 cigarettes or less in the short-term.  She is unwilling to completely abstain currently however is open to cutting back by 50% today and then she would consider complete cessation.    Following phone call to [facility] [address] a product that was covered by Medicaid was identified.  The rugby product was ordered AND will be available tomorrow.  Patient will find $3 for the Rx.  Patient willing to commit to short-term goal of smoking 10 cigs or less per day after staring patch.   We will set a more significant goal (possibly complete cessation) if she can achieve the first goal.   Written information provided.  F/U Rx Clinic Visit  After visit scheduled with Dr. Pollie MeyerMcIntyre.   Total  time in face-to-face counseling 30 minutes.  Patient seen with Dr. Anselm LisMelanie Marsh.

## 2013-06-05 ENCOUNTER — Ambulatory Visit: Payer: Medicaid Other | Admitting: Family Medicine

## 2013-06-06 ENCOUNTER — Telehealth: Payer: Self-pay

## 2013-06-06 NOTE — Telephone Encounter (Signed)
Request from pharmacy for nebulizing medications. (albuterol inhalation nebulization solution)  I do not see this on the medication list.  Will check with provider for refill authorization.   Laurell Josephsammy K Mintie Witherington, RN

## 2013-06-09 NOTE — Telephone Encounter (Signed)
Can refill her albuterol inhaler and nebs  for her

## 2013-06-10 ENCOUNTER — Other Ambulatory Visit: Payer: Medicaid Other

## 2013-06-13 ENCOUNTER — Other Ambulatory Visit: Payer: Self-pay | Admitting: Licensed Clinical Social Worker

## 2013-06-13 DIAGNOSIS — J45909 Unspecified asthma, uncomplicated: Secondary | ICD-10-CM

## 2013-06-13 MED ORDER — ALBUTEROL SULFATE (2.5 MG/3ML) 0.083% IN NEBU: 2.5000 mg | INHALATION_SOLUTION | Freq: Four times a day (QID) | RESPIRATORY_TRACT | Status: DC | PRN

## 2013-06-17 ENCOUNTER — Other Ambulatory Visit: Payer: Medicaid Other

## 2013-06-17 DIAGNOSIS — B20 Human immunodeficiency virus [HIV] disease: Secondary | ICD-10-CM

## 2013-06-17 LAB — CBC WITH DIFFERENTIAL/PLATELET
Basophils Absolute: 0 10*3/uL (ref 0.0–0.1)
Basophils Relative: 1 % (ref 0–1)
EOS PCT: 2 % (ref 0–5)
Eosinophils Absolute: 0.1 10*3/uL (ref 0.0–0.7)
HEMATOCRIT: 42.9 % (ref 36.0–46.0)
Hemoglobin: 14.4 g/dL (ref 12.0–15.0)
LYMPHS ABS: 1.7 10*3/uL (ref 0.7–4.0)
Lymphocytes Relative: 36 % (ref 12–46)
MCH: 30.6 pg (ref 26.0–34.0)
MCHC: 33.6 g/dL (ref 30.0–36.0)
MCV: 91.3 fL (ref 78.0–100.0)
Monocytes Absolute: 0.4 10*3/uL (ref 0.1–1.0)
Monocytes Relative: 9 % (ref 3–12)
Neutro Abs: 2.5 10*3/uL (ref 1.7–7.7)
Neutrophils Relative %: 52 % (ref 43–77)
Platelets: 243 10*3/uL (ref 150–400)
RBC: 4.7 MIL/uL (ref 3.87–5.11)
RDW: 14 % (ref 11.5–15.5)
WBC: 4.8 10*3/uL (ref 4.0–10.5)

## 2013-06-17 LAB — COMPLETE METABOLIC PANEL WITH GFR
ALT: 17 U/L (ref 0–35)
AST: 21 U/L (ref 0–37)
Albumin: 4.3 g/dL (ref 3.5–5.2)
Alkaline Phosphatase: 110 U/L (ref 39–117)
BUN: 13 mg/dL (ref 6–23)
CALCIUM: 9.2 mg/dL (ref 8.4–10.5)
CHLORIDE: 94 meq/L — AB (ref 96–112)
CO2: 27 meq/L (ref 19–32)
Creat: 1.07 mg/dL (ref 0.50–1.10)
GFR, EST AFRICAN AMERICAN: 68 mL/min
GFR, Est Non African American: 59 mL/min — ABNORMAL LOW
Glucose, Bld: 96 mg/dL (ref 70–99)
Potassium: 5.1 mEq/L (ref 3.5–5.3)
Sodium: 132 mEq/L — ABNORMAL LOW (ref 135–145)
Total Bilirubin: 0.3 mg/dL (ref 0.2–1.2)
Total Protein: 7.6 g/dL (ref 6.0–8.3)

## 2013-06-18 LAB — HIV-1 RNA QUANT-NO REFLEX-BLD
HIV 1 RNA Quant: <20 copies/mL (ref <20)
HIV-1 RNA Quant, Log: <1.3 {Log} (ref <1.30)

## 2013-06-18 LAB — T-HELPER CELL (CD4) - (RCID CLINIC ONLY)
CD4 % Helper T Cell: 30 % — ABNORMAL LOW (ref 33–55)
CD4 T Cell Abs: 580 /uL (ref 400–2700)

## 2013-06-24 ENCOUNTER — Ambulatory Visit: Payer: Medicaid Other | Admitting: Internal Medicine

## 2013-06-26 ENCOUNTER — Ambulatory Visit (INDEPENDENT_AMBULATORY_CARE_PROVIDER_SITE_OTHER): Payer: Medicaid Other | Admitting: Family Medicine

## 2013-06-26 ENCOUNTER — Encounter: Payer: Self-pay | Admitting: Family Medicine

## 2013-06-26 VITALS — BP 122/80 | HR 80 | Temp 98.7°F | Ht 59.0 in | Wt 157.0 lb

## 2013-06-26 DIAGNOSIS — J449 Chronic obstructive pulmonary disease, unspecified: Secondary | ICD-10-CM

## 2013-06-26 DIAGNOSIS — L84 Corns and callosities: Secondary | ICD-10-CM

## 2013-06-26 DIAGNOSIS — F17200 Nicotine dependence, unspecified, uncomplicated: Secondary | ICD-10-CM

## 2013-06-26 DIAGNOSIS — Z720 Tobacco use: Secondary | ICD-10-CM

## 2013-06-26 NOTE — Progress Notes (Signed)
Patient ID: [name], female   DOB: 04/12/1960, 53 y.o.   MRN: [id number]  HPI:  Podiatry referral: pt is requesting a referral to [facility] in order ot get her callouses shaved off her R foot. She has been a patient there for several months, and goes regularly. She has medicaid so she is asking that I authorize a referral.   COPD f/u: Takes spiriva daily. States her breathing has been doing well. She has been using albuterol MDI just a few times per week, but has been using albuterol nebs BID scheduled. Denies fevers. Noted to be coughing heavily at the beginning of her clinic appointment, but she states this was just a little flare up that just started a few minutes ago and she has overall been doing well.  Smoking cessation: was seen by Dr. Raymondo BandKoval for advice on smoking cessation. She is now back up to 1 ppd. States she does not want to quit. She tried the patch and didn't like it. No longer interested in quitting.   ROS: See HPI  PMFSH: hx HIV, COPD, schizophrenia  PHYSICAL EXAM: BP 122/80  Pulse 80  Temp(Src) 98.7 F (37.1 C) (Oral)  Ht 4\' 11"  (1.499 m)  Wt 157 lb (71.215 kg)  BMI 31.69 kg/m2 Gen: NAD HEENT: NCAT Heart: RRR Lungs: CTAB, initially coughing frequently but this calmed down when we began talking to a NWOB. No desats below 90 when ambulating. Lungs clear without wheezes or crackles. Neuro: speech normal but talks loudly Ext: No appreciable lower extremity edema bilaterally, R foot with lateral plantar surface thick callous without skin breakdown, drainage, or erythema.  ASSESSMENT/PLAN:  See problem based charting for additional assessment/plan.  FOLLOW UP: F/u in 1 month for COPD.  [name], MD [facility]

## 2013-06-26 NOTE — Patient Instructions (Addendum)
It was great to see you again today!  Keep track of how often you are using albuterol. Return to see me in 1 month.  Be well, Dr. Pollie MeyerMcIntyre

## 2013-06-29 NOTE — Assessment & Plan Note (Signed)
Will order referral to podiatrist so she can continue getting care there.

## 2013-06-29 NOTE — Assessment & Plan Note (Signed)
Pt now no longer interested in quitting. Will remain available to her in the future to discuss quitting techniques if she becomes interested.

## 2013-06-29 NOTE — Assessment & Plan Note (Signed)
Pt has begun using MDI prn, but is still using albuterol nebs on a scheduled basis. Provided education to her today regarding needing to use this prn in order to truly assess COPD control.  She will begin using prn and will then f/u with me in 1 month.

## 2013-07-01 ENCOUNTER — Ambulatory Visit (INDEPENDENT_AMBULATORY_CARE_PROVIDER_SITE_OTHER): Payer: Medicaid Other | Admitting: Internal Medicine

## 2013-07-01 ENCOUNTER — Encounter: Payer: Self-pay | Admitting: Internal Medicine

## 2013-07-01 VITALS — BP 119/77 | HR 68 | Temp 98.0°F | Ht 59.0 in | Wt 151.0 lb

## 2013-07-01 DIAGNOSIS — Z7189 Other specified counseling: Secondary | ICD-10-CM

## 2013-07-01 DIAGNOSIS — Z0000 Encounter for general adult medical examination without abnormal findings: Secondary | ICD-10-CM

## 2013-07-01 DIAGNOSIS — B20 Human immunodeficiency virus [HIV] disease: Secondary | ICD-10-CM

## 2013-07-01 DIAGNOSIS — Z23 Encounter for immunization: Secondary | ICD-10-CM

## 2013-07-01 DIAGNOSIS — Z21 Asymptomatic human immunodeficiency virus [HIV] infection status: Secondary | ICD-10-CM

## 2013-07-01 DIAGNOSIS — Z716 Tobacco abuse counseling: Secondary | ICD-10-CM

## 2013-07-01 NOTE — Progress Notes (Signed)
Subjective:    Patient ID: [name], female    DOB: 10/08/1960, 53 y.o.   MRN: [id number]  HPI [name] is a 53yo F with HIV with schizophrenia and developmental delay, COPD with nicotine dependence, CD 4 count of 580/VL <20 currently on raltegravir/DRVr/truvada. Doing well with ART. She is able to name and identify her regimen. She states that she notices joints ache after walking c/w her arthritis. She still smokes 1ppd. Does not want to decrease intake  Current Outpatient Prescriptions on File Prior to Visit  Medication Sig Dispense Refill  . albuterol (PROVENTIL HFA;VENTOLIN HFA) 108 (90 BASE) MCG/ACT inhaler Inhale 2 puffs into the lungs every 6 (six) hours as needed. For shortness of breath  1 Inhaler  2  . albuterol (PROVENTIL) (2.5 MG/3ML) 0.083% nebulizer solution Take 3 mLs (2.5 mg total) by nebulization every 6 (six) hours as needed for wheezing.  75 mL  2  . benztropine (COGENTIN) 0.5 MG tablet Take 0.5 mg by mouth 2 (two) times daily.      . carbamazepine (TEGRETOL) 200 MG tablet Take 200-400 mg by mouth 2 (two) times daily. Take one tablet in the morning and two tablets at bedtime      . Darunavir Ethanolate (PREZISTA) 800 MG tablet Take 1 tablet (800 mg total) by mouth daily with breakfast.  30 tablet  11  . emtricitabine-tenofovir (TRUVADA) 200-300 MG per tablet TAKE 1 TABLET BY MOUTH DAILY.  30 tablet  6  . QUEtiapine (SEROQUEL) 50 MG tablet Take 50 mg by mouth at bedtime.      . raltegravir (ISENTRESS) 400 MG tablet TAKE 1 TABLET BY MOUTH 2 TIMES DAILY.  60 tablet  6  . risperiDONE (RISPERDAL) 1 MG tablet Take 1 mg by mouth daily at 12 noon.      . risperiDONE microspheres (RISPERDAL CONSTA) 50 MG injection Inject 50 mg into the muscle every 14 (fourteen) days.        . ritonavir (NORVIR) 100 MG TABS Take 1 tablet (100 mg total) by mouth daily.  30 tablet  11  . tiotropium (SPIRIVA HANDIHALER) 18 MCG inhalation capsule Place 1 capsule (18 mcg total) into inhaler and inhale  daily.  30 capsule  3  . nicotine (NICODERM CQ) 21 mg/24hr patch Place 1 patch (21 mg total) onto the skin daily.  28 patch  1   No current facility-administered medications on file prior to visit.   Active Ambulatory Problems    Diagnosis Date Noted  . Asthma 08/18/2010  . HIV (human immunodeficiency virus infection) 08/18/2010  . Schizophrenia 08/18/2010  . Herpes simplex infection 08/18/2010  . Pre-ulcerative corn or callous 05/07/2012  . Urinary incontinence, nocturnal enuresis 05/07/2012  . Fecal incontinence 05/07/2012  . Systolic murmur 05/07/2012  . Routine health maintenance 05/07/2012  . Hepatitis B infection   . COPD (chronic obstructive pulmonary disease) 02/18/2013  . Tobacco abuse 02/18/2013   Resolved Ambulatory Problems    Diagnosis Date Noted  . No Resolved Ambulatory Problems   Past Medical History  Diagnosis Date  . GERD (gastroesophageal reflux disease)   . AIDS 12-10-2007  . Herpes simplex   . History of cholecystectomy   . Cataract   . Nonspecific reaction to tuberculin skin test without active tuberculosis 11-2008 [facility]   . Asthma   . Seizures        Review of Systems     Objective:   Physical Exam BP 119/77  Pulse 68  Temp(Src) 98 F (  36.7 C) (Oral)  Ht 4\' 11"  (1.499 m)  Wt 151 lb (68.493 kg)  BMI 30.48 kg/m2 Physical Exam  Constitutional:  oriented to person, place, and time. appears well-developed and well-nourished. No distress.  HENT: edentulous Mouth/Throat: Oropharynx is clear and moist. No oropharyngeal exudate.  Cardiovascular: Normal rate, regular rhythm and normal heart sounds. Exam reveals no gallop and no friction rub.  No murmur heard.  Pulmonary/Chest: Effort normal and breath sounds normal. No respiratory distress. Mild rhonchi on expiration. Clears with cough Abdominal: Soft. Bowel sounds are normal.  exhibits no distension. There is no tenderness.  Lymphadenopathy: no cervical adenopathy.  Neurological: alert and  oriented to person, place, and time.  Skin: Skin is warm and dry. No rash noted. No erythema.  Psychiatric: a normal mood and affect. His behavior is . normal       Assessment & Plan:  hiv = well controlled, continue with current regimen  Smoking cessation = spent 3 min providing smoking cessation counseling. precontemplative   Arthritis = can use tylenol or advil. Will give rx for cane. pcp referred to ortho/podiatary  Health maintenance = give tdap today  rtc in 3 mo

## 2013-07-03 ENCOUNTER — Other Ambulatory Visit: Payer: Self-pay | Admitting: *Deleted

## 2013-07-03 DIAGNOSIS — B20 Human immunodeficiency virus [HIV] disease: Secondary | ICD-10-CM

## 2013-07-03 DIAGNOSIS — J45909 Unspecified asthma, uncomplicated: Secondary | ICD-10-CM

## 2013-07-03 MED ORDER — DARUNAVIR ETHANOLATE 800 MG PO TABS: 800.0000 mg | ORAL_TABLET | Freq: Every day | ORAL | Status: DC

## 2013-07-03 MED ORDER — RITONAVIR 100 MG PO TABS: 100.0000 mg | ORAL_TABLET | Freq: Every day | ORAL | Status: DC

## 2013-07-03 MED ORDER — RALTEGRAVIR POTASSIUM 400 MG PO TABS: ORAL_TABLET | ORAL | Status: DC

## 2013-07-03 MED ORDER — EMTRICITABINE-TENOFOVIR DF 200-300 MG PO TABS: ORAL_TABLET | ORAL | Status: DC

## 2013-07-09 ENCOUNTER — Other Ambulatory Visit: Payer: Self-pay | Admitting: *Deleted

## 2013-07-09 DIAGNOSIS — J45909 Unspecified asthma, uncomplicated: Secondary | ICD-10-CM

## 2013-07-09 MED ORDER — ALBUTEROL SULFATE HFA 108 (90 BASE) MCG/ACT IN AERS: 2.0000 | INHALATION_SPRAY | Freq: Four times a day (QID) | RESPIRATORY_TRACT | Status: DC | PRN

## 2013-07-25 ENCOUNTER — Encounter: Payer: Self-pay | Admitting: Family Medicine

## 2013-07-25 ENCOUNTER — Ambulatory Visit (INDEPENDENT_AMBULATORY_CARE_PROVIDER_SITE_OTHER): Payer: Medicaid Other | Admitting: Family Medicine

## 2013-07-25 VITALS — BP 119/77 | HR 70 | Temp 98.0°F | Ht 59.0 in | Wt 150.0 lb

## 2013-07-25 DIAGNOSIS — Z1211 Encounter for screening for malignant neoplasm of colon: Secondary | ICD-10-CM

## 2013-07-25 DIAGNOSIS — J449 Chronic obstructive pulmonary disease, unspecified: Secondary | ICD-10-CM

## 2013-07-25 NOTE — Patient Instructions (Signed)
It was great to see you again today!  Use your albuterol inhaler and albuterol nebulizer ONLY AS NEEDED. Continue the spiriva. Come back to see me in 1 month so we can see how your breathing is doing.  Follow the instructions on how to do the stool cards.  Be well, Dr. Pollie MeyerMcIntyre

## 2013-07-25 NOTE — Progress Notes (Signed)
Patient ID: [name], female   DOB: 05/30/1960, 53 y.o.   MRN: [id number]  HPI:  F/u COPD: patient reports she is currently taking spiriva daily. Uses albtuerol inhaler about once per week, but is using nebulized albuterol every single night on a scheduled basis. Reports she coughs a lot at night. She is still smoking and does not want to quit. She smokes 1ppd. She denies having a lot of coughing or SOB during the daytime. Overall she thinks she is doing pretty well.  ROS: See HPI  PMFSH: hx HIV, schizophrenia  PHYSICAL EXAM: BP 119/77  Pulse 70  Temp(Src) 98 F (36.7 C) (Oral)  Ht 4\' 11"  (1.499 m)  Wt 150 lb (68.04 kg)  BMI 30.28 kg/m2  SpO2 100% Gen: NAD HEENT: NCAT Heart: RRR, no murmurs Lungs: CTAB, NWOB Neuro: grossly nonfocal, speech loud but intact  ASSESSMENT/PLAN:  # Health maintenance:  -needs colon cancer screening. Pt refused colonoscopy referral, but is willing to do stool cards. These were given to her today.  See problem based charting for additional assessment/plan.  FOLLOW UP: F/u in 1 month for COPD.  [name], MD [facility]

## 2013-07-27 NOTE — Assessment & Plan Note (Signed)
Unable to objectively assess how well she's doing since she continues to use albuterol on a scheduled basis. We discussed today in detail about how she should ONLY use albuterol MDI or nebulizer if she is actively needing it. She understood these instructions. Will have her f/u in 1 month to assess control of COPD. Continued to recommend smoking cessation, but patient not interested at this time.

## 2013-07-29 ENCOUNTER — Ambulatory Visit (INDEPENDENT_AMBULATORY_CARE_PROVIDER_SITE_OTHER): Payer: Medicaid Other

## 2013-07-29 ENCOUNTER — Ambulatory Visit: Payer: Medicaid Other

## 2013-07-29 VITALS — BP 145/90 | HR 80 | Resp 19 | Ht 59.0 in | Wt 150.0 lb

## 2013-07-29 DIAGNOSIS — L84 Corns and callosities: Secondary | ICD-10-CM

## 2013-07-29 DIAGNOSIS — Q828 Other specified congenital malformations of skin: Secondary | ICD-10-CM

## 2013-07-29 NOTE — Progress Notes (Signed)
   Subjective:    Patient ID: Levi Alandawn L Igarashi, female    DOB: 12/14/1960, 53 y.o.   MRN: [id number]  HPI Comments: Pt presents for debridement of B/L plantar feet, and dry skin.     Review of Systems no new system in changes or findings are noted     Objective:   Physical Exam Neurovascular status is intact with pedal pulses palpable DP +2/4 PT plus one over 4 epicritic and proprioceptive sensations intact patient multiple keratoses sub-fifth MTP area right is in a poor keratotic lesion consistent with either verruca report keratoses also lesion inferior lateral posterior left heel with a large keratoses and skin fissure no open ulcerations no discharge or drainage no secondary infections.       Assessment & Plan:  Assessment this time is multiple 4 keratoses will dry scaling skin fissuring patient does have diabetes or vascular compromise with pedal pulses being palpable advised nail care and trimming corns or calluses or verruca are likely noncovered service. ABN form is reviewed and signed by the patient this time keratoses is debrided patient returned future and as-needed basis for continued palliative care is needed  Alvan Dameichard Dalana Pfahler DPM

## 2013-07-29 NOTE — Patient Instructions (Signed)

## 2013-08-14 ENCOUNTER — Other Ambulatory Visit: Payer: Self-pay | Admitting: Internal Medicine

## 2013-08-27 ENCOUNTER — Ambulatory Visit: Payer: Medicaid Other | Admitting: Family Medicine

## 2013-09-17 ENCOUNTER — Other Ambulatory Visit: Payer: Self-pay | Admitting: Internal Medicine

## 2013-09-17 DIAGNOSIS — J45909 Unspecified asthma, uncomplicated: Secondary | ICD-10-CM

## 2013-09-18 ENCOUNTER — Other Ambulatory Visit: Payer: Medicaid Other

## 2013-09-22 ENCOUNTER — Other Ambulatory Visit (INDEPENDENT_AMBULATORY_CARE_PROVIDER_SITE_OTHER): Payer: Medicaid Other

## 2013-09-22 DIAGNOSIS — B20 Human immunodeficiency virus [HIV] disease: Secondary | ICD-10-CM

## 2013-09-22 LAB — COMPLETE METABOLIC PANEL WITH GFR
ALBUMIN: 4 g/dL (ref 3.5–5.2)
ALK PHOS: 104 U/L (ref 39–117)
ALT: 15 U/L (ref 0–35)
AST: 19 U/L (ref 0–37)
BUN: 9 mg/dL (ref 6–23)
CO2: 25 meq/L (ref 19–32)
Calcium: 8.7 mg/dL (ref 8.4–10.5)
Chloride: 92 mEq/L — ABNORMAL LOW (ref 96–112)
Creat: 0.95 mg/dL (ref 0.50–1.10)
GFR, EST AFRICAN AMERICAN: 79 mL/min
GFR, EST NON AFRICAN AMERICAN: 68 mL/min
GLUCOSE: 85 mg/dL (ref 70–99)
POTASSIUM: 4.5 meq/L (ref 3.5–5.3)
Sodium: 125 mEq/L — ABNORMAL LOW (ref 135–145)
Total Bilirubin: 0.6 mg/dL (ref 0.2–1.2)
Total Protein: 6.7 g/dL (ref 6.0–8.3)

## 2013-09-22 LAB — CBC WITH DIFFERENTIAL/PLATELET
Basophils Absolute: 0 10*3/uL (ref 0.0–0.1)
Basophils Relative: 1 % (ref 0–1)
Eosinophils Absolute: 0.2 10*3/uL (ref 0.0–0.7)
Eosinophils Relative: 5 % (ref 0–5)
HCT: 42.6 % (ref 36.0–46.0)
Hemoglobin: 14.7 g/dL (ref 12.0–15.0)
LYMPHS ABS: 1.7 10*3/uL (ref 0.7–4.0)
LYMPHS PCT: 53 % — AB (ref 12–46)
MCH: 30.2 pg (ref 26.0–34.0)
MCHC: 34.5 g/dL (ref 30.0–36.0)
MCV: 87.5 fL (ref 78.0–100.0)
Monocytes Absolute: 0.4 10*3/uL (ref 0.1–1.0)
Monocytes Relative: 11 % (ref 3–12)
NEUTROS PCT: 30 % — AB (ref 43–77)
Neutro Abs: 1 10*3/uL — ABNORMAL LOW (ref 1.7–7.7)
PLATELETS: 268 10*3/uL (ref 150–400)
RBC: 4.87 MIL/uL (ref 3.87–5.11)
RDW: 15.6 % — ABNORMAL HIGH (ref 11.5–15.5)
WBC: 3.3 10*3/uL — ABNORMAL LOW (ref 4.0–10.5)

## 2013-09-23 LAB — T-HELPER CELL (CD4) - (RCID CLINIC ONLY)
CD4 % Helper T Cell: 32 % — ABNORMAL LOW (ref 33–55)
CD4 T CELL ABS: 550 /uL (ref 400–2700)

## 2013-09-23 LAB — HIV-1 RNA QUANT-NO REFLEX-BLD: HIV-1 RNA Quant, Log: <1.3 {Log} (ref <1.30)

## 2013-09-25 ENCOUNTER — Other Ambulatory Visit: Payer: Self-pay | Admitting: Internal Medicine

## 2013-10-02 ENCOUNTER — Ambulatory Visit (INDEPENDENT_AMBULATORY_CARE_PROVIDER_SITE_OTHER): Payer: Medicaid Other | Admitting: Internal Medicine

## 2013-10-02 ENCOUNTER — Encounter: Payer: Self-pay | Admitting: Internal Medicine

## 2013-10-02 VITALS — BP 127/77 | HR 80 | Temp 98.0°F | Wt 146.0 lb

## 2013-10-02 DIAGNOSIS — F17200 Nicotine dependence, unspecified, uncomplicated: Secondary | ICD-10-CM

## 2013-10-02 DIAGNOSIS — B20 Human immunodeficiency virus [HIV] disease: Secondary | ICD-10-CM

## 2013-10-02 DIAGNOSIS — Z720 Tobacco use: Secondary | ICD-10-CM

## 2013-10-02 DIAGNOSIS — Z0000 Encounter for general adult medical examination without abnormal findings: Secondary | ICD-10-CM

## 2013-10-02 DIAGNOSIS — Z21 Asymptomatic human immunodeficiency virus [HIV] infection status: Secondary | ICD-10-CM

## 2013-10-02 NOTE — Progress Notes (Signed)
Subjective:    Patient ID: [name], female    DOB: 03/07/1961, 53 y.o.   MRN: [id number]  HPI [name] is a 53yo F with HIV with schizophrenia and developmental delay, COPD with nicotine dependence, CD 4 count of 550/VL <20 currently on raltegravir/DRVr/truvada. Doing well with ART. She reports not missing a dose. She still smokes 1ppd. Does not want to decrease intake of smoking but is attempting to cut back. No recent illnesses since last seen in clinic. Still uses albuterol inhaler nad nebulizer if wheezing is exacerbated  Current Outpatient Prescriptions on File Prior to Visit  Medication Sig Dispense Refill  . albuterol (PROVENTIL) (2.5 MG/3ML) 0.083% nebulizer solution INHALE CONTENTS OF 1 VIAL USING NEBULIZER EVERY 6 HOURS AS NEEDED FOR WHEEZING  75 mL  5  . benztropine (COGENTIN) 0.5 MG tablet Take 0.5 mg by mouth 2 (two) times daily.      . carbamazepine (TEGRETOL) 200 MG tablet Take 200-400 mg by mouth 2 (two) times daily. Take one tablet in the morning and two tablets at bedtime      . Darunavir Ethanolate (PREZISTA) 800 MG tablet Take 1 tablet (800 mg total) by mouth daily with breakfast.  30 tablet  11  . emtricitabine-tenofovir (TRUVADA) 200-300 MG per tablet TAKE 1 TABLET BY MOUTH DAILY.  30 tablet  11  . ISENTRESS 400 MG tablet TAKE ONE TABLET   BY MOUTH   TWICE A DAY  60 tablet  3  . PROAIR HFA 108 (90 BASE) MCG/ACT inhaler INHALE TWO   PUFFS INTO LUNGS EVERY 6 HOURS AS NEEDED FOR SHORTNESS OF BREATH  1 Inhaler  2  . QUEtiapine (SEROQUEL) 50 MG tablet Take 50 mg by mouth at bedtime.      . risperiDONE (RISPERDAL) 1 MG tablet Take 1 mg by mouth daily at 12 noon.      . risperiDONE microspheres (RISPERDAL CONSTA) 50 MG injection Inject 50 mg into the muscle every 14 (fourteen) days.        . ritonavir (NORVIR) 100 MG TABS tablet Take 1 tablet (100 mg total) by mouth daily.  30 tablet  11  . tiotropium (SPIRIVA HANDIHALER) 18 MCG inhalation capsule Place 1 capsule (18 mcg total)  into inhaler and inhale daily.  30 capsule  3  . nicotine (NICODERM CQ) 21 mg/24hr patch Place 1 patch (21 mg total) onto the skin daily.  28 patch  1   No current facility-administered medications on file prior to visit.     Review of Systems 10 point ros is negative    Objective:   Physical Exam BP 127/77  Pulse 80  Temp(Src) 98 F (36.7 C) (Oral)  Wt 146 lb (66.225 kg) Physical Exam  Constitutional:  oriented to person, place, and time. appears well-developed and well-nourished. No distress.  HENT:  Mouth/Throat: Oropharynx is clear and moist. No oropharyngeal exudate. Poor dentition Cardiovascular: Normal rate, regular rhythm and normal heart sounds. Exam reveals no gallop and no friction rub.  No murmur heard.  Pulmonary/Chest: Effort normal and breath sounds normal. No respiratory distress.  has no wheezes.  Lymphadenopathy: no cervical adenopathy.  Skin: Skin is warm and dry. No rash noted. No erythema. Small callous to lateral plantar aspect of irght foot Psychiatric: a normal mood and affect.  behavior is normal.         Assessment & Plan:  hiv = well controlled, continue with current regimen. Will need to check if she would be candidate for  tivicay, but not sure if it interacts with risperidone  Health maintenance = mammo due in December, flu in the fall  irght foot callous = getting referred by pcp  Smoking cessation = provided 3 min of counseling

## 2013-10-13 ENCOUNTER — Telehealth: Payer: Self-pay | Admitting: Family Medicine

## 2013-10-13 DIAGNOSIS — L84 Corns and callosities: Secondary | ICD-10-CM

## 2013-10-13 NOTE — Telephone Encounter (Signed)
Called and informed patient that I placed the referral, someone will call her once the appointment has been scheduled.[name], [name]

## 2013-10-13 NOTE — Telephone Encounter (Signed)
Patient would like to switch podiatrist. Currently goes to [facility] but [facility] to [facility] where her brother goes. Will need a new referral. Please advise.

## 2013-11-10 ENCOUNTER — Telehealth: Payer: Self-pay | Admitting: Family Medicine

## 2013-11-10 NOTE — Telephone Encounter (Signed)
Pt called and needs a referral to the [facility]. jw

## 2013-11-12 NOTE — Telephone Encounter (Signed)
Old referral didn't have auth # so it got cancelled, new referral placed in [facility] workqueue.

## 2013-11-21 ENCOUNTER — Ambulatory Visit (INDEPENDENT_AMBULATORY_CARE_PROVIDER_SITE_OTHER): Payer: Medicaid Other

## 2013-11-21 DIAGNOSIS — B070 Plantar wart: Secondary | ICD-10-CM

## 2013-11-21 DIAGNOSIS — Q828 Other specified congenital malformations of skin: Secondary | ICD-10-CM

## 2013-11-21 NOTE — Patient Instructions (Signed)
Corns and Calluses Corns are small areas of thickened skin that usually occur on the top, sides, or tip of a toe. They contain a cone-shaped core with a point that can press on a nerve below. This causes pain. Calluses are areas of thickened skin that usually develop on hands, fingers, palms, soles of the feet, and heels. These are areas that experience frequent friction or pressure. CAUSES  Corns are usually the result of rubbing (friction) or pressure from shoes that are too tight or do not fit properly. Calluses are caused by repeated friction and pressure on the affected areas. SYMPTOMS  A hard growth on the skin.  Pain or tenderness under the skin.  Sometimes, redness and swelling.  Increased discomfort while wearing tight-fitting shoes. DIAGNOSIS  Your caregiver can usually tell what the problem is by doing a physical exam. TREATMENT  Removing the cause of the friction or pressure is usually the only treatment needed. However, sometimes medicines can be used to help soften the hardened, thickened areas. These medicines include salicylic acid plasters and 12% ammonium lactate lotion. These medicines should only be used under the direction of your caregiver. HOME CARE INSTRUCTIONS   Try to remove pressure from the affected area.  You may wear donut-shaped corn pads to protect your skin.  You may use a pumice stone or nonmetallic nail file to gently reduce the thickness of a corn.  Wear properly fitted footwear.  If you have calluses on the hands, wear gloves during activities that cause friction.  If you have diabetes, you should regularly examine your feet. Tell your caregiver if you notice any problems with your feet. SEEK IMMEDIATE MEDICAL CARE IF:   You have increased pain, swelling, redness, or warmth in the affected area.  Your corn or callus starts to drain fluid or bleeds.  You are not getting better, even with treatment. Document Released: 01/01/2004 Document  Revised: 06/19/2011 Document Reviewed: 11/22/2010 [employer] Patient Information 2015 [employer]. This information is not intended to replace advice given to you by your health care provider. Make sure you discuss any questions you have with your health care provider.  

## 2013-11-21 NOTE — Progress Notes (Signed)
   Subjective:    Patient ID: [name], female    DOB: 05/30/1960, 53 y.o.   MRN: [id number]  HPI Comments: Pt states her right 5th MPJ plantar area is very painful, and request the area to be trimmed.     Review of Systems no new findings or systemic changes noted     Objective:   Physical Exam Vascular status is intact pedal pulses palpable DP postal for PT one over 4 Refill time 3 seconds. Keratotic lesion sub-fifth MTP area right this is well located circumscribed lesion is debrided her verruca versus porokeratosis. No open wounds ulcerations no secondary infections.       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 Patient request for debridement of keratoses which is debrided down to dermal level. Maintain cushion accommodative shoes patient previous he given some diabetic insoles. Again posse trimming verruca likely noncovered service return for future followup and palliative care and as-needed basis.  Alvan Dameichard Aldred Mase DPM

## 2013-12-11 ENCOUNTER — Ambulatory Visit (INDEPENDENT_AMBULATORY_CARE_PROVIDER_SITE_OTHER): Payer: Medicaid Other | Admitting: Family Medicine

## 2013-12-11 ENCOUNTER — Encounter: Payer: Self-pay | Admitting: Family Medicine

## 2013-12-11 VITALS — BP 136/80 | HR 75 | Temp 98.0°F | Wt 147.0 lb

## 2013-12-11 DIAGNOSIS — N9089 Other specified noninflammatory disorders of vulva and perineum: Secondary | ICD-10-CM

## 2013-12-11 DIAGNOSIS — R109 Unspecified abdominal pain: Secondary | ICD-10-CM

## 2013-12-11 LAB — POCT URINALYSIS DIPSTICK
Bilirubin, UA: NEGATIVE
Glucose, UA: NEGATIVE
Ketones, UA: NEGATIVE
LEUKOCYTES UA: NEGATIVE
Nitrite, UA: NEGATIVE
PROTEIN UA: NEGATIVE
Spec Grav, UA: 1.015
Urobilinogen, UA: 0.2
pH, UA: 6.5

## 2013-12-11 LAB — POCT UA - MICROSCOPIC ONLY

## 2013-12-11 LAB — POCT URINE PREGNANCY: PREG TEST UR: NEGATIVE

## 2013-12-11 MED ORDER — NYSTATIN 100000 UNIT/GM EX POWD
CUTANEOUS | Status: DC
Start: 2013-12-11 — End: 2014-11-06

## 2013-12-11 MED ORDER — FLUCONAZOLE 150 MG PO TABS: 150.0000 mg | ORAL_TABLET | Freq: Once | ORAL | Status: DC

## 2013-12-11 NOTE — Patient Instructions (Addendum)
It was great to see you again today!  For the bump on your bottom: -apply warm compresses (a warm wet washcloth will work) -take diflucan  tablet once. Repeat in 3 days if the area in your genitals isn't better -apply nystatin powder three times a day to the outer skin of your genitals. Keep the area dry.  For abdominal pain: -we are checking your urine for infection -we will call you if you need an antibiotic for this  Let's see if the above treatment helps your pain. Follow up sooner if it is not improving. Return to see me in a few weeks to talk about your breathing medicines and make sure your pain and vomiting are feeling better.  Be well, Dr. Pollie Meyer

## 2013-12-11 NOTE — Progress Notes (Signed)
Patient ID: [name], female   DOB: 1961/02/18, 53 y.o.   MRN: [id number]  HPI:  About 1 month ago began to have abdominal pain and vomiting on and off. It's not happening every day, sometimes every other day. Has pain in the middle of her lower abdomen/suprapubic area. No dysuria. Has vomited 3-4 times in the last two weeks. No fevers. Occasional loose bowels if drinks coffee.   It feels sore on her skin in her genitals sometimes like she's wiping too much. Has a bump on her bottom that occasionally bleeds. Has never had hemorrhoids before. No vaginal discharge. No periods (postmenopausal). The pain in her bottom is separate from the suprapubic pain. She wears pampers constantly, has been incontinent of urine for years. Occasionally incontinent of stool as well. Uses baby wipes every time to clean off her genitals.  ROS: See HPI.  PMFSH: hx HIV, schizophrenia, hepatitis B, COPD, tobacco abuse  PHYSICAL EXAM: BP 136/80  Pulse 75  Temp(Src) 98 F (36.7 C) (Oral)  Wt 147 lb (66.679 kg) Gen: NAD, cooperative, talkative HEENT: NCAT, MMM Heart: RRR Lungs: CTAB, NWOB Neuro: grossly nonfocal, at pt's baseline Abd: normoactive bowel sounds. Soft, nontender to palpation. No masses or organomegaly. No suprapubic tenderness appreciable. Back: no CVA tenderness bilaterally GU: external genitalia show irritation and mild maceration of vulva. Small furuncle on R aspect of perineum. No fluctuance, induration, tenderness to palpation. No drainage. Patient adamantly refused any internal genital exam. Psych: loud, talkative, not aware of social cues  ASSESSMENT/PLAN:  Abdominal pain Pt c/o suprapubic pain, however abdominal exam entirely nontender and patient appears well. This could possibly be a UTI (hx of this noted) but pt has no fever, CVA tenderness, dysuria. UA is a dirty catch (5-10 epithelial cells) with trace blood but this is explained by the irritation of her vulva seen on external pelvic  exam. No nitrites to suggest absolute UTI. Urine pregnancy test negative. Will treat her vulvar irritation and plan to repeat UA in a few weeks to ensure blood seen on UA has resolved. F/u in a few weeks for reassessment, sooner if not improving.  Vulvar irritation Likely candidal vulvitis and possibly vaginal candidiasis as well, evaluation limited by pt refusing full pelvic exam. Furuncle does not appear infected. Plan: -nystatin powder to external vulva TID -diflucan  x1, repeat in 3 days if not improved -furuncle - warm compresses a few times per day -recommend wiping with toilet paper instead of baby wipes in case this is causing irritation  Will monitor to see if this improves her symptoms in general. She will return in a couple weeks for reassessment, or sooner if she's not better.   FOLLOW UP: F/u in a few weeks for abd pain/vomiting & vulvar irritation, also to discuss COPD control.  [place] J. Pollie Meyer, MD [facility]

## 2013-12-12 DIAGNOSIS — R109 Unspecified abdominal pain: Secondary | ICD-10-CM | POA: Insufficient documentation

## 2013-12-12 DIAGNOSIS — N9089 Other specified noninflammatory disorders of vulva and perineum: Secondary | ICD-10-CM | POA: Insufficient documentation

## 2013-12-12 NOTE — Assessment & Plan Note (Signed)
Likely candidal vulvitis and possibly vaginal candidiasis as well, evaluation limited by pt refusing full pelvic exam. Furuncle does not appear infected. Plan: -nystatin powder to external vulva TID -diflucan  x1, repeat in 3 days if not improved -furuncle - warm compresses a few times per day -recommend wiping with toilet paper instead of baby wipes in case this is causing irritation  Will monitor to see if this improves her symptoms in general. She will return in a couple weeks for reassessment, or sooner if she's not better.

## 2013-12-12 NOTE — Assessment & Plan Note (Addendum)
Pt c/o suprapubic pain, however abdominal exam entirely nontender and patient appears well. This could possibly be a UTI (hx of this noted) but pt has no fever, CVA tenderness, dysuria. UA is a dirty catch (5-10 epithelial cells) with trace blood but this is explained by the irritation of her vulva seen on external pelvic exam. No nitrites to suggest absolute UTI. Urine pregnancy test negative. Will treat her vulvar irritation and plan to repeat UA in a few weeks to ensure blood seen on UA has resolved. F/u in a few weeks for reassessment, sooner if not improving.

## 2013-12-18 ENCOUNTER — Other Ambulatory Visit: Payer: Self-pay | Admitting: *Deleted

## 2013-12-18 ENCOUNTER — Other Ambulatory Visit: Payer: Self-pay | Admitting: Internal Medicine

## 2013-12-18 DIAGNOSIS — B20 Human immunodeficiency virus [HIV] disease: Secondary | ICD-10-CM

## 2013-12-18 MED ORDER — RALTEGRAVIR POTASSIUM 400 MG PO TABS: ORAL_TABLET | ORAL | Status: DC

## 2014-01-05 ENCOUNTER — Ambulatory Visit (INDEPENDENT_AMBULATORY_CARE_PROVIDER_SITE_OTHER): Payer: Medicaid Other | Admitting: Internal Medicine

## 2014-01-05 ENCOUNTER — Encounter: Payer: Self-pay | Admitting: Internal Medicine

## 2014-01-05 VITALS — BP 136/91 | HR 104 | Temp 97.8°F | Wt 149.0 lb

## 2014-01-05 DIAGNOSIS — Z0000 Encounter for general adult medical examination without abnormal findings: Secondary | ICD-10-CM

## 2014-01-05 DIAGNOSIS — Z23 Encounter for immunization: Secondary | ICD-10-CM

## 2014-01-05 NOTE — Progress Notes (Signed)
Patient ID: [name], female   DOB: Apr 01, 1961, 53 y.o.   MRN: [id number]       Patient ID: [name], female   DOB: 1960/11/09, 53 y.o.   MRN: [id number]  HPI [name] is a 53yo F with HIV disease, schizophrenia, COPD lives with her family. CD 4 count of 550/VLM<20 on raltegravir/DRVr/truvada. Doing well with her medicaitons. She sees her psychiatrist on the 6th. She mentions that her brother has decreased/controlled her smoking. He dispenses roughly 6 cig per day, down from a ppd. Her family also regulates what she can buy at the store so that she doesn't purchase cartons of smokes. She states that she is in good state of health. No wheezing.  Outpatient Encounter Prescriptions as of 01/05/2014  Medication Sig  . albuterol (PROVENTIL) (2.5 MG/3ML) 0.083% nebulizer solution INHALE CONTENTS OF 1 VIAL USING NEBULIZER EVERY 6 HOURS AS NEEDED FOR WHEEZING  . benztropine (COGENTIN) 0.5 MG tablet Take 0.5 mg by mouth 2 (two) times daily.  . carbamazepine (TEGRETOL) 200 MG tablet Take 200-400 mg by mouth 2 (two) times daily. Take one tablet in the morning and two tablets at bedtime  . Darunavir Ethanolate (PREZISTA) 800 MG tablet Take 1 tablet (800 mg total) by mouth daily with breakfast.  . emtricitabine-tenofovir (TRUVADA) 200-300 MG per tablet TAKE 1 TABLET BY MOUTH DAILY.  . fluconazole (DIFLUCAN) 150 MG tablet Take 1 tablet (150 mg total) by mouth once. Repeat in 3 days if not better.  Marland Kitchen PROAIR HFA 108 (90 BASE) MCG/ACT inhaler INHALE TWO   PUFFS INTO LUNGS EVERY 6 HOURS AS NEEDED FOR SHORTNESS OF BREATH  . QUEtiapine (SEROQUEL) 50 MG tablet Take 50 mg by mouth at bedtime.  . raltegravir (ISENTRESS) 400 MG tablet TAKE ONE TABLET   BY MOUTH   TWICE A DAY  . risperiDONE (RISPERDAL) 1 MG tablet Take 1 mg by mouth daily at 12 noon.  . risperiDONE microspheres (RISPERDAL CONSTA) 50 MG injection Inject 50 mg into the muscle every 14 (fourteen) days.    . ritonavir (NORVIR) 100 MG TABS tablet Take 1  tablet (100 mg total) by mouth daily.  Marland Kitchen tiotropium (SPIRIVA HANDIHALER) 18 MCG inhalation capsule Place 1 capsule (18 mcg total) into inhaler and inhale daily.  . nicotine (NICODERM CQ) 21 mg/24hr patch Place 1 patch (21 mg total) onto the skin daily.  Marland Kitchen nystatin (MYCOSTATIN/NYSTOP) 100000 UNIT/GM POWD Apply topically 3 times daily to outside of vulvar area     Patient Active Problem List   Diagnosis Date Noted  . Abdominal pain 12/12/2013  . Vulvar irritation 12/12/2013  . COPD (chronic obstructive pulmonary disease) 02/18/2013  . Tobacco abuse 02/18/2013  . Hepatitis B infection   . Pre-ulcerative corn or callous 05/07/2012  . Urinary incontinence, nocturnal enuresis 05/07/2012  . Fecal incontinence 05/07/2012  . Systolic murmur 05/07/2012  . Routine health maintenance 05/07/2012  . Asthma 08/18/2010  . HIV (human immunodeficiency virus infection) 08/18/2010  . Schizophrenia 08/18/2010  . Herpes simplex infection 08/18/2010     Health Maintenance Due  Topic Date Due  . Colonoscopy  09/09/2009  . Influenza Vaccine  11/08/2013     Review of Systems 10 point ros is negative Physical Exam   BP 136/91  Pulse 104  Temp(Src) 97.8 F (36.6 C) (Oral)  Wt 149 lb (67.586 kg) Physical Exam  Constitutional:  oriented to person, place, and time. appears well-developed and well-nourished. No distress.  HENT: edentulous Mouth/Throat: Oropharynx is clear and moist.  No oropharyngeal exudate.  Cardiovascular: Normal rate, regular rhythm and normal heart sounds. Exam reveals no gallop and no friction rub.  No murmur heard.  Pulmonary/Chest: Effort normal and breath sounds normal. No respiratory distress.  has no wheezes.  Abdominal: Soft. Bowel sounds are normal.  exhibits no distension. There is no tenderness.  Lymphadenopathy: no cervical adenopathy.  Neurological: alert and oriented to person, place, and time.  Skin: Skin is warm and dry. No rash noted. No erythema.  Psychiatric:  at baseline  Lab Results  Component Value Date   CD4TCELL 32* 09/22/2013   Lab Results  Component Value Date   CD4TABS 550 09/22/2013   CD4TABS 580 06/17/2013   CD4TABS 500 03/18/2013   Lab Results  Component Value Date   HIV1RNAQUANT <20 09/22/2013   No results found for this basename: HEPBSAB   No results found for this basename: RPR    CBC Lab Results  Component Value Date   WBC 3.3* 09/22/2013   RBC 4.87 09/22/2013   HGB 14.7 09/22/2013   HCT 42.6 09/22/2013   PLT 268 09/22/2013   MCV 87.5 09/22/2013   MCH 30.2 09/22/2013   MCHC 34.5 09/22/2013   RDW 15.6* 09/22/2013   LYMPHSABS 1.7 09/22/2013   MONOABS 0.4 09/22/2013   EOSABS 0.2 09/22/2013   BASOSABS 0.0 09/22/2013   BMET Lab Results  Component Value Date   NA 125* 09/22/2013   K 4.5 09/22/2013   CL 92* 09/22/2013   CO2 25 09/22/2013   GLUCOSE 85 09/22/2013   BUN 9 09/22/2013   CREATININE 0.95 09/22/2013   CALCIUM 8.7 09/22/2013   GFRNONAA 68 09/22/2013   GFRAA 79 09/22/2013     Assessment and Plan  hiv = well controled. Continue with current regimen. Can consider to change to prez-cobix instead of drv/r but will need to see if interaction with psych meds  Schizophrenia =appears well managed by psychiatry  Health maintenance = flu shot today  Tobacco dependence = applauded her decrease in smoking. Continue the good work

## 2014-01-21 ENCOUNTER — Other Ambulatory Visit: Payer: Self-pay | Admitting: Internal Medicine

## 2014-01-21 ENCOUNTER — Ambulatory Visit
Admission: RE | Admit: 2014-01-21 | Discharge: 2014-01-21 | Disposition: A | Discharge status: Home / Self Care | Payer: Medicaid Other | Source: Ambulatory Visit | Attending: Internal Medicine | Admitting: Internal Medicine

## 2014-01-21 DIAGNOSIS — Z1231 Encounter for screening mammogram for malignant neoplasm of breast: Secondary | ICD-10-CM

## 2014-01-21 DIAGNOSIS — Z0000 Encounter for general adult medical examination without abnormal findings: Secondary | ICD-10-CM

## 2014-01-28 ENCOUNTER — Ambulatory Visit (INDEPENDENT_AMBULATORY_CARE_PROVIDER_SITE_OTHER): Payer: Medicaid Other | Admitting: Family Medicine

## 2014-01-28 ENCOUNTER — Encounter: Payer: Self-pay | Admitting: Family Medicine

## 2014-01-28 VITALS — BP 106/70 | HR 77 | Temp 98.1°F | Ht 59.0 in | Wt 147.4 lb

## 2014-01-28 DIAGNOSIS — N3944 Nocturnal enuresis: Secondary | ICD-10-CM

## 2014-01-28 DIAGNOSIS — R3129 Other microscopic hematuria: Secondary | ICD-10-CM

## 2014-01-28 DIAGNOSIS — R312 Other microscopic hematuria: Secondary | ICD-10-CM

## 2014-01-28 DIAGNOSIS — J449 Chronic obstructive pulmonary disease, unspecified: Secondary | ICD-10-CM

## 2014-01-28 LAB — POCT URINALYSIS DIPSTICK
Bilirubin, UA: NEGATIVE
GLUCOSE UA: NEGATIVE
Ketones, UA: NEGATIVE
Leukocytes, UA: NEGATIVE
Nitrite, UA: NEGATIVE
Protein, UA: 30
SPEC GRAV UA: 1.025
UROBILINOGEN UA: 0.2
pH, UA: 6

## 2014-01-28 LAB — POCT UA - MICROSCOPIC ONLY

## 2014-01-28 MED ORDER — ALBUTEROL SULFATE HFA 108 (90 BASE) MCG/ACT IN AERS: INHALATION_SPRAY | RESPIRATORY_TRACT | Status: DC

## 2014-01-28 NOTE — Progress Notes (Signed)
Patient ID: [name], female   DOB: 11/19/1960, 53 y.o.   MRN: [id number]  HPI:  Needs rx for pullups. Gets these delivered to her home monthly. Needs a new prescription. Previously had some abdominal pain, states this has resolved. Occasionally but rarely spits up. No more issues in vulvar area (had been treated for vaginal & topical candidiasis).  Requests referral to pulmonology, Dr. Craige CottaSood. Her brother is his patient and strongly would like for her to go see him as well. Has hx of COPD/asthma and currently smokes. Her brother limits how much she smokes by giving her a daily allotment of cigarettes. Currently using spiriva daily. Uses nebulizer at home a couple nights per week. Out of albuterol inhaler, needs refill.  ROS: See HPI.  PMFSH: hx HIV, hepatitis B, COPD  PHYSICAL EXAM: BP 106/70  Pulse 77  Temp(Src) 98.1 F (36.7 C) (Oral)  Ht 4\' 11"  (1.499 m)  Wt 147 lb 6.4 oz (66.86 kg)  BMI 29.76 kg/m2 Gen: NAD HEENT: NCAT Heart: RRR Lungs: normal respiratory effort. No crackles, does have faint occasional expiratory wheeze Neuro: grossly nonfocal, speech normal for patient (speaks very loudly) Ext: No appreciable lower extremity edema bilaterally   ASSESSMENT/PLAN:  COPD (chronic obstructive pulmonary disease) Discussed with patient that at this point she does not necessarily need pulmonology referral, given that her symptoms are relatively well controlled just on spiriva and prn albuterol. Also explained that we can do in-office PFT's here. She still would like referral to Dr. Craige CottaSood as he is her brother's pulmonologist. Will enter referral. Refilled albuterol inhaler.  Microscopic hematuria Had microscopic hematuria on last UA, but was dirty catch. Will repeat UA today. Sx's have now resolved.  Urinary incontinence, nocturnal enuresis Gave written rx for diaper supply.   FOLLOW UP: F/u in 2 months for chronic medical problems Referring to pulmonology  GrenadaBrittany J.  Pollie MeyerMcIntyre, MD [facility]

## 2014-01-29 DIAGNOSIS — R3129 Other microscopic hematuria: Secondary | ICD-10-CM | POA: Insufficient documentation

## 2014-01-29 NOTE — Assessment & Plan Note (Signed)
Gave written rx for diaper supply.

## 2014-01-29 NOTE — Assessment & Plan Note (Signed)
Had microscopic hematuria on last UA, but was dirty catch. Will repeat UA today. Sx's have now resolved.

## 2014-01-29 NOTE — Assessment & Plan Note (Signed)
Discussed with patient that at this point she does not necessarily need pulmonology referral, given that her symptoms are relatively well controlled just on spiriva and prn albuterol. Also explained that we can do in-office PFT's here. She still would like referral to Dr. Craige CottaSood as he is her brother's pulmonologist. Will enter referral. Refilled albuterol inhaler.

## 2014-02-17 ENCOUNTER — Ambulatory Visit (INDEPENDENT_AMBULATORY_CARE_PROVIDER_SITE_OTHER): Payer: Medicaid Other

## 2014-02-17 DIAGNOSIS — Q828 Other specified congenital malformations of skin: Secondary | ICD-10-CM

## 2014-02-17 DIAGNOSIS — L84 Corns and callosities: Secondary | ICD-10-CM

## 2014-02-17 DIAGNOSIS — B070 Plantar wart: Secondary | ICD-10-CM

## 2014-02-17 NOTE — Patient Instructions (Signed)
Corns and Calluses Corns are small areas of thickened skin that usually occur on the top, sides, or tip of a toe. They contain a cone-shaped core with a point that can press on a nerve below. This causes pain. Calluses are areas of thickened skin that usually develop on hands, fingers, palms, soles of the feet, and heels. These are areas that experience frequent friction or pressure. CAUSES  Corns are usually the result of rubbing (friction) or pressure from shoes that are too tight or do not fit properly. Calluses are caused by repeated friction and pressure on the affected areas. SYMPTOMS  A hard growth on the skin.  Pain or tenderness under the skin.  Sometimes, redness and swelling.  Increased discomfort while wearing tight-fitting shoes. DIAGNOSIS  Your caregiver can usually tell what the problem is by doing a physical exam. TREATMENT  Removing the cause of the friction or pressure is usually the only treatment needed. However, sometimes medicines can be used to help soften the hardened, thickened areas. These medicines include salicylic acid plasters and 12% ammonium lactate lotion. These medicines should only be used under the direction of your caregiver. HOME CARE INSTRUCTIONS   Try to remove pressure from the affected area.  You may wear donut-shaped corn pads to protect your skin.  You may use a pumice stone or nonmetallic nail file to gently reduce the thickness of a corn.  Wear properly fitted footwear.  If you have calluses on the hands, wear gloves during activities that cause friction.  If you have diabetes, you should regularly examine your feet. Tell your caregiver if you notice any problems with your feet. SEEK IMMEDIATE MEDICAL CARE IF:   You have increased pain, swelling, redness, or warmth in the affected area.  Your corn or callus starts to drain fluid or bleeds.  You are not getting better, even with treatment. Document Released: 01/01/2004 Document  Revised: 06/19/2011 Document Reviewed: 11/22/2010 [employer] Patient Information 2015 [employer]. This information is not intended to replace advice given to you by your health care provider. Make sure you discuss any questions you have with your health care provider.  

## 2014-02-17 NOTE — Progress Notes (Signed)
   Subjective:    Patient ID: [name], female    DOB: 10/25/1960, 53 y.o.   MRN: [id number]  HPI Comments: Pt presents for debridement of right 5th MPJ plantar callous.     Review of Systems no new findings or systemic changes noted     Objective:   Physical Exam Neurovascular status unchanged pedal pulses are palpable epicritic and proprioceptive sensations intact there is a keratotic lesion sub-fifth MTP area right which is debrided at this time there is no active discharge drainage no ulcer no purulence no signs of infection lesions well-circumscribed however debridement there is no interruption of skin lines helping verruca versus porokeratosis       Assessment & Plan:  Assessment porokeratosis versus pre-ulcerative callus plantar aspect right foot sub-fifth MTP area keratotic lesion debridement continue with lotion or cream to the area daily shoes at all times no barefoot or flimsy shoes or flip-flops discharge to an as-needed basis for any future follow-up and palliative care is needed next  [place] Mercedes Fort DPM

## 2014-03-25 ENCOUNTER — Ambulatory Visit (INDEPENDENT_AMBULATORY_CARE_PROVIDER_SITE_OTHER)
Admission: RE | Admit: 2014-03-25 | Discharge: 2014-03-25 | Disposition: A | Discharge status: Home / Self Care | Payer: Medicaid Other | Source: Ambulatory Visit | Attending: Pulmonary Disease | Admitting: Pulmonary Disease

## 2014-03-25 ENCOUNTER — Encounter: Payer: Self-pay | Admitting: Pulmonary Disease

## 2014-03-25 ENCOUNTER — Ambulatory Visit (INDEPENDENT_AMBULATORY_CARE_PROVIDER_SITE_OTHER): Payer: Medicaid Other | Admitting: Pulmonary Disease

## 2014-03-25 VITALS — BP 123/86 | HR 70 | Ht 59.0 in | Wt 148.6 lb

## 2014-03-25 DIAGNOSIS — F209 Schizophrenia, unspecified: Secondary | ICD-10-CM

## 2014-03-25 DIAGNOSIS — R053 Chronic cough: Secondary | ICD-10-CM

## 2014-03-25 DIAGNOSIS — Z720 Tobacco use: Secondary | ICD-10-CM

## 2014-03-25 DIAGNOSIS — R05 Cough: Secondary | ICD-10-CM

## 2014-03-25 DIAGNOSIS — B20 Human immunodeficiency virus [HIV] disease: Secondary | ICD-10-CM

## 2014-03-25 DIAGNOSIS — Z716 Tobacco abuse counseling: Secondary | ICD-10-CM

## 2014-03-25 DIAGNOSIS — J449 Chronic obstructive pulmonary disease, unspecified: Secondary | ICD-10-CM

## 2014-03-25 MED ORDER — FLUTICASONE FUROATE-VILANTEROL 100-25 MCG/INH IN AEPB: 1.0000 | INHALATION_SPRAY | Freq: Every day | RESPIRATORY_TRACT | Status: DC

## 2014-03-25 NOTE — Progress Notes (Signed)
Chief Complaint  Patient presents with  . PULMONARY CONSULT    Self referral SOB and Cough. Pt not using pulmonary medications as directed.     History of Present Illness: [name] is a 53 y.o. female smoker for evaluation of dyspnea.  She has history of COPD, asthma, and HIV.    She continues to smoke about 1/2 pack per day.  She has been told she has asthma and COPD.  She has daily cough with clear sputum.  She gets episodes of wheezing, especially at night.  She uses her nebulizer and this helps some, but then she starts smoking as soon as she feels better.  She denies hemoptysis.  She is not aware of any allergies.  [place], but moved to [place] in 2004.  She is disabled due to hx of schizophrenia.  She does not have any pets.  There is no hx of pneumonia.  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  She denies sinus congestion, sore throat, chest pain, abdominal pain, leg swelling, joint pain, or skin rash.  Tests: CT chest 11/06/05 >> b/l hilar/mediastinal LAN  PMHx >> GERD, Schizophrenia, HIV, Hepatitis B, PPD positive, Seizures  [name]  has past surgical history that includes Cholecystectomy and Leg Surgery.  Prior to Admission medications   Medication Sig Start Date End Date Taking? Authorizing Provider  albuterol (PROAIR HFA) 108 (90 BASE) MCG/ACT inhaler INHALE TWO   PUFFS INTO LUNGS EVERY 6 HOURS AS NEEDED FOR SHORTNESS OF BREATH 01/28/14   Latrelle DodrillBrittany J McIntyre, MD  albuterol (PROVENTIL) (2.5 MG/3ML) 0.083% nebulizer solution INHALE CONTENTS OF 1 VIAL USING NEBULIZER EVERY 6 HOURS AS NEEDED FOR WHEEZING 09/17/13   Judyann Munsonynthia Snider, MD  benztropine (COGENTIN) 0.5 MG tablet Take 0.5 mg by mouth 2 (two) times daily.    Historical Provider, MD  carbamazepine (TEGRETOL) 200 MG tablet Take 200-400 mg by mouth 2 (two) times daily. Take one tablet in the morning and two tablets at bedtime     Historical Provider, MD  Darunavir Ethanolate (PREZISTA) 800 MG tablet Take 1 tablet (800 mg total) by mouth daily with breakfast. 07/03/13   Judyann Munsonynthia Snider, MD  emtricitabine-tenofovir (TRUVADA) 200-300 MG per tablet TAKE 1 TABLET BY MOUTH DAILY. 07/03/13   Judyann Munsonynthia Snider, MD  nicotine (NICODERM CQ) 21 mg/24hr patch Place 1 patch (21 mg total) onto the skin daily. 02/18/13   Uvaldo RisingKyle J Fletke, MD  nystatin (MYCOSTATIN/NYSTOP) 100000 UNIT/GM POWD Apply topically 3 times daily to outside of vulvar area 12/11/13   Latrelle DodrillBrittany J McIntyre, MD  QUEtiapine (SEROQUEL) 50 MG tablet Take 50 mg by mouth at bedtime.    Historical Provider, MD  raltegravir (ISENTRESS) 400 MG tablet TAKE ONE TABLET   BY MOUTH   TWICE A DAY 12/18/13   Judyann Munsonynthia Snider, MD  risperiDONE (RISPERDAL) 1 MG tablet Take 1 mg by mouth daily at 12 noon.    Historical Provider, MD  risperiDONE microspheres (RISPERDAL CONSTA) 50 MG injection Inject 50 mg into the muscle every 14 (fourteen) days.      Historical Provider, MD  ritonavir (NORVIR) 100 MG TABS tablet Take 1 tablet (100 mg total) by mouth daily. 07/03/13   Judyann Munsonynthia Snider, MD  tiotropium (SPIRIVA HANDIHALER) 18 MCG inhalation capsule Place 1 capsule (18 mcg total) into inhaler and inhale daily. 02/18/13   Uvaldo RisingKyle J Fletke, MD  No Known Allergies  Her family history includes Cancer in her mother and sister; Diabetes in her brother and sister; Heart disease in her father; Hypertension in her brother and mother.  She  reports that she has been smoking Cigarettes.  She started smoking about 39 years ago. She has a 70 pack-year smoking history. She has never used smokeless tobacco. She reports that she does not drink alcohol or use illicit drugs.   Physical Exam:  General - No distress ENT - No sinus tenderness, no oral exudate, no LAN, no thyromegaly, TM clear, pupils equal/reactive Cardiac - s1s2 regular, no murmur, pulses symmetric Chest - b/l rhonchi, no wheeze or dullness Back - No  focal tenderness Abd - Soft, non-tender, no organomegaly, + bowel sounds Ext - No edema Neuro - Normal strength, cranial nerves intact Skin - No rashes Psych - generally pleasant, but agitated at times   Lab Results  Component Value Date   WBC 3.3* 09/22/2013   HGB 14.7 09/22/2013   HCT 42.6 09/22/2013   MCV 87.5 09/22/2013   PLT 268 09/22/2013    Lab Results  Component Value Date   CREATININE 0.95 09/22/2013   BUN 9 09/22/2013   NA 125* 09/22/2013   K 4.5 09/22/2013   CL 92* 09/22/2013   CO2 25 09/22/2013    Lab Results  Component Value Date   ALT 15 09/22/2013   AST 19 09/22/2013   ALKPHOS 104 09/22/2013   BILITOT 0.6 09/22/2013    Lab Results  Component Value Date   TSH 3.264 10/23/2010    BNP    Component Value Date/Time   PROBNP 240.4* 10/23/2010 0530      Assessment/Plan:  Chronic productive cough and wheezing with report of COPD and Asthma.  She does not feel that spiriva helps. Plan: - will have her try Breo - will get CXR, PFT to further assess  Tobacco abuse. Had extensive discussion about how smoking is affecting her breathing and health. Plan: - reviewed options to help her quit smoking  Hx of HIV >> viral load undetectable from 09/22/13. Plan: - f/u with primary care    Coralyn HellingVineet Maisy Newport, MD  Pulmonary/Critical Care/Sleep Pager:  [phone]

## 2014-03-25 NOTE — Patient Instructions (Signed)
Breo one puff daily > rinse mouth after each use Chest xray today Will schedule breathing test  STOP SMOKING  Follow up in 6 to 8 weeks

## 2014-03-25 NOTE — Progress Notes (Signed)
   Subjective:    Patient ID: [name], female    DOB: 03/24/1961, 53 y.o.   MRN: [id number]  HPI    Review of Systems  Constitutional: Negative for fever and unexpected weight change.  HENT: Positive for congestion. Negative for dental problem, ear pain, nosebleeds, postnasal drip, rhinorrhea, sinus pressure, sneezing, sore throat and trouble swallowing.   Eyes: Negative for redness and itching.  Respiratory: Positive for cough (yellow mucus) and shortness of breath. Negative for chest tightness and wheezing.   Cardiovascular: Positive for palpitations. Negative for leg swelling.  Gastrointestinal: Negative for nausea and vomiting.       Acid heartburn//indigestion  Genitourinary: Negative for dysuria.  Musculoskeletal: Negative for joint swelling.  Skin: Negative for rash.  Neurological: Negative for headaches.  Hematological: Does not bruise/bleed easily.  Psychiatric/Behavioral: Negative for dysphoric mood. The patient is nervous/anxious.        Objective:   Physical Exam        Assessment & Plan:

## 2014-03-26 ENCOUNTER — Telehealth: Payer: Self-pay | Admitting: Pulmonary Disease

## 2014-03-26 DIAGNOSIS — R911 Solitary pulmonary nodule: Secondary | ICD-10-CM

## 2014-03-26 NOTE — Telephone Encounter (Signed)
Dg Chest 2 View  03/25/2014   CLINICAL DATA:  Chronic cough, history of smoking, congestion  EXAM: CHEST  2 VIEW  COMPARISON:  Chest x-ray of 02/11/2013 and chest x-ray of 02/27/2012  FINDINGS: The lungs are clear but somewhat hyperaerated with flattened hemidiaphragms. There is mild peribronchial thickening which may indicate bronchitis, possibly chronic. There does appear to be a nodular opacity posteriorly at the lung base probably at the left lung base just above the hemidiaphragm on the frontal view. This nodule is not definitely seen on prior chest x-ray and CT of the chest is recommended to assess further. No focal infiltrate or effusion is seen. Cardiomegaly is stable. No acute bony abnormality is noted.  IMPRESSION: 1. 10 mm nodular opacity probably in the left lower lobe posteriorly, not definitely seen previously. Recommend CT the chest to assess further. 2. Hyper aeration and probable changes of chronic bronchitis. 3. Stable cardiomegaly.   Electronically Signed   By: Dwyane DeePaul  Barry M.D.   On: 03/25/2014 10:25    D/w pt's brother.  Will arrange for CT chest with contrast to assess.

## 2014-04-06 ENCOUNTER — Ambulatory Visit (INDEPENDENT_AMBULATORY_CARE_PROVIDER_SITE_OTHER)
Admission: RE | Admit: 2014-04-06 | Discharge: 2014-04-06 | Disposition: A | Discharge status: Home / Self Care | Payer: Medicaid Other | Source: Ambulatory Visit | Attending: Pulmonary Disease | Admitting: Pulmonary Disease

## 2014-04-06 DIAGNOSIS — R911 Solitary pulmonary nodule: Secondary | ICD-10-CM

## 2014-04-06 MED ORDER — IOHEXOL 300 MG/ML  SOLN
80.0000 mL | Freq: Once | INTRAMUSCULAR | Status: AC | PRN
  Administered 2014-04-06: 80 mL via INTRAVENOUS

## 2014-04-08 ENCOUNTER — Telehealth: Payer: Self-pay | Admitting: Pulmonary Disease

## 2014-04-08 ENCOUNTER — Telehealth: Payer: Self-pay

## 2014-04-08 NOTE — Telephone Encounter (Signed)
CT chest 04/06/14 >> b/l calcified granulomas.   Discussed results with pt's brother.

## 2014-04-08 NOTE — Telephone Encounter (Signed)
Spoke with Toniann FailWendy at [phone] to initiate PA for pt's Breo.  This has to be sent to their physicians for further review to see if med will be covered.  Our office needs to call after 24 hours to check on status of this.  The account reference # for this is [id number] P.  Routing to triage to follow up on after the new year.

## 2014-04-13 ENCOUNTER — Encounter: Payer: Self-pay | Admitting: Internal Medicine

## 2014-04-13 ENCOUNTER — Ambulatory Visit (INDEPENDENT_AMBULATORY_CARE_PROVIDER_SITE_OTHER): Payer: Medicaid Other | Admitting: Internal Medicine

## 2014-04-13 ENCOUNTER — Telehealth: Payer: Self-pay | Admitting: Pulmonary Disease

## 2014-04-13 VITALS — BP 136/83 | HR 76 | Temp 97.8°F | Wt 147.0 lb

## 2014-04-13 DIAGNOSIS — Z21 Asymptomatic human immunodeficiency virus [HIV] infection status: Secondary | ICD-10-CM

## 2014-04-13 DIAGNOSIS — R059 Cough, unspecified: Secondary | ICD-10-CM

## 2014-04-13 DIAGNOSIS — Z79899 Other long term (current) drug therapy: Secondary | ICD-10-CM

## 2014-04-13 DIAGNOSIS — Z113 Encounter for screening for infections with a predominantly sexual mode of transmission: Secondary | ICD-10-CM

## 2014-04-13 DIAGNOSIS — R05 Cough: Secondary | ICD-10-CM

## 2014-04-13 LAB — COMPLETE METABOLIC PANEL WITH GFR
ALK PHOS: 114 U/L (ref 39–117)
ALT: 19 U/L (ref 0–35)
AST: 25 U/L (ref 0–37)
Albumin: 4.4 g/dL (ref 3.5–5.2)
BUN: 10 mg/dL (ref 6–23)
CO2: 25 mEq/L (ref 19–32)
Calcium: 9.5 mg/dL (ref 8.4–10.5)
Chloride: 96 mEq/L (ref 96–112)
Creat: 0.97 mg/dL (ref 0.50–1.10)
GFR, Est African American: 77 mL/min
GFR, Est Non African American: 66 mL/min
Glucose, Bld: 81 mg/dL (ref 70–99)
Potassium: 4.8 mEq/L (ref 3.5–5.3)
SODIUM: 131 meq/L — AB (ref 135–145)
TOTAL PROTEIN: 7.3 g/dL (ref 6.0–8.3)
Total Bilirubin: 0.3 mg/dL (ref 0.2–1.2)

## 2014-04-13 LAB — CBC WITH DIFFERENTIAL/PLATELET
BASOS PCT: 1 % (ref 0–1)
Basophils Absolute: 0 10*3/uL (ref 0.0–0.1)
EOS ABS: 0.1 10*3/uL (ref 0.0–0.7)
Eosinophils Relative: 2 % (ref 0–5)
HCT: 45.2 % (ref 36.0–46.0)
Hemoglobin: 15.2 g/dL — ABNORMAL HIGH (ref 12.0–15.0)
Lymphocytes Relative: 40 % (ref 12–46)
Lymphs Abs: 1.6 10*3/uL (ref 0.7–4.0)
MCH: 31 pg (ref 26.0–34.0)
MCHC: 33.6 g/dL (ref 30.0–36.0)
MCV: 92.2 fL (ref 78.0–100.0)
MPV: 9.6 fL (ref 8.6–12.4)
Monocytes Absolute: 0.5 10*3/uL (ref 0.1–1.0)
Monocytes Relative: 11 % (ref 3–12)
NEUTROS ABS: 1.9 10*3/uL (ref 1.7–7.7)
NEUTROS PCT: 46 % (ref 43–77)
PLATELETS: 248 10*3/uL (ref 150–400)
RBC: 4.9 MIL/uL (ref 3.87–5.11)
RDW: 15.7 % — ABNORMAL HIGH (ref 11.5–15.5)
WBC: 4.1 10*3/uL (ref 4.0–10.5)

## 2014-04-13 LAB — LIPID PANEL
CHOL/HDL RATIO: 2.6 ratio
CHOLESTEROL: 160 mg/dL (ref 0–200)
HDL: 62 mg/dL (ref >39)
LDL Cholesterol: 79 mg/dL (ref 0–99)
TRIGLYCERIDES: 95 mg/dL (ref <150)
VLDL: 19 mg/dL (ref 0–40)

## 2014-04-13 MED ORDER — GUAIFENESIN-DM 100-10 MG/5ML PO SYRP: 5.0000 mL | ORAL_SOLUTION | ORAL | Status: DC | PRN

## 2014-04-13 NOTE — Telephone Encounter (Signed)
Lowell Bouton, CMA at 04/13/2014 5:00 PM     Status: Signed       Expand All Collapse All   Per the 12.16.15 ov note w/ VS Breo was [facility] [address] [facility] Scale who verified receipt of the Breo from the 12/16 visit - pt never picked up rx and it was placed back on the shelf  Called spoke with patient who reported that every time she went to the pharmacy ([facility] [address] verified) she was informed the rx was "not ready". Pt asked that I call [facility] and request this rx be refilled for her. She is aware that she is scheduled to see VS on 2.3.16 w/ PFT and will keep this appt. Pt asked that I call her once I have spoken with [facility]. Advised pt will do so.  [facility] and spoke with Zollie Scale who reported that "they're working on it now". Called spoke with pt and informed her that her Virgel Bouquet will be ready shortly. Pt is reliant upon family for transportation but is aware she needs to begin this medication as soon as she can. Pt voiced her understanding.  Nothing further needed; will sign off.

## 2014-04-13 NOTE — Telephone Encounter (Signed)
Per the 12.16.15 ov note w/ VS Breo was sent [facility] [address] [facility] and spoke with Zollie Scale who verified receipt of the Breo from the 12/16 visit - pt never picked up rx and it was placed back on the shelf  Called spoke with patient who reported that every time she went to the pharmacy ([facility] [address] verified) she was informed the rx was "not ready".  Pt asked that I call [facility] and request this rx be refilled for her.  She is aware that she is scheduled to see VS on 2.3.16 w/ PFT and will keep this appt.  Pt asked that I call her once I have spoken with [facility].  Advised pt will do so.  [facility] and spoke with Zollie Scale who reported that "they're working on it now".  Called spoke with pt and informed her that her Virgel Bouquet will be ready shortly.  Pt is reliant upon family for transportation but is aware she needs to begin this medication as soon as she can.  Pt voiced her understanding.  Nothing further needed; will sign off.

## 2014-04-13 NOTE — Progress Notes (Signed)
Patient ID: [name], female   DOB: Dec 07, 1960, 54 y.o.   MRN: [id number]  HPI 54yo F with schizophrenia, COPD, tobacco abuse, well controlled HIV disease,550/VL<20 on RLG/truvada/DRVr. She reports start to have  Cough and runny nose, that has "worsened her COPD". She has been using her albuterol neb to help with symptoms. She saw pulmonologist Last week, had chest CT, no lung lesions. Getting PFTs this week. No fever, chills, nightsweats. Cough keeps her up at night  Outpatient Encounter Prescriptions as of 04/13/2014  Medication Sig  . albuterol (PROAIR HFA) 108 (90 BASE) MCG/ACT inhaler INHALE TWO   PUFFS INTO LUNGS EVERY 6 HOURS AS NEEDED FOR SHORTNESS OF BREATH  . albuterol (PROVENTIL) (2.5 MG/3ML) 0.083% nebulizer solution INHALE CONTENTS OF 1 VIAL USING NEBULIZER EVERY 6 HOURS AS NEEDED FOR WHEEZING  . benztropine (COGENTIN) 0.5 MG tablet Take 0.5 mg by mouth 2 (two) times daily.  . carbamazepine (TEGRETOL) 200 MG tablet Take 200-400 mg by mouth 2 (two) times daily. Take one tablet in the morning and two tablets at bedtime  . Darunavir Ethanolate (PREZISTA) 800 MG tablet Take 1 tablet (800 mg total) by mouth daily with breakfast.  . emtricitabine-tenofovir (TRUVADA) 200-300 MG per tablet TAKE 1 TABLET BY MOUTH DAILY.  Marland Kitchen Fluticasone Furoate-Vilanterol (BREO ELLIPTA) 100-25 MCG/INH AEPB Inhale 1 puff into the lungs daily.  . nicotine (NICODERM CQ) 21 mg/24hr patch Place 1 patch (21 mg total) onto the skin daily.  Marland Kitchen nystatin (MYCOSTATIN/NYSTOP) 100000 UNIT/GM POWD Apply topically 3 times daily to outside of vulvar area  . QUEtiapine (SEROQUEL) 50 MG tablet Take 50 mg by mouth at bedtime.  . raltegravir (ISENTRESS) 400 MG tablet TAKE ONE TABLET   BY MOUTH   TWICE A DAY  . risperiDONE (RISPERDAL) 1 MG tablet Take 1 mg by mouth daily at 12 noon.  . risperiDONE microspheres (RISPERDAL CONSTA) 50 MG injection Inject 50 mg into the muscle every 14 (fourteen) days.    . ritonavir (NORVIR)  100 MG TABS tablet Take 1 tablet (100 mg total) by mouth daily.  Marland Kitchen tiotropium (SPIRIVA HANDIHALER) 18 MCG inhalation capsule Place 1 capsule (18 mcg total) into inhaler and inhale daily. (Patient not taking: Reported on 03/25/2014)     Patient Active Problem List   Diagnosis Date Noted  . Microscopic hematuria 01/29/2014  . Abdominal pain 12/12/2013  . Vulvar irritation 12/12/2013  . COPD (chronic obstructive pulmonary disease) 02/18/2013  . Tobacco abuse 02/18/2013  . Hepatitis B infection   . Pre-ulcerative corn or callous 05/07/2012  . Urinary incontinence, nocturnal enuresis 05/07/2012  . Fecal incontinence 05/07/2012  . Systolic murmur 05/07/2012  . Routine health maintenance 05/07/2012  . Asthma 08/18/2010  . HIV (human immunodeficiency virus infection) 08/18/2010  . Schizophrenia 08/18/2010  . Herpes simplex infection 08/18/2010     Health Maintenance Due  Topic Date Due  . COLONOSCOPY  09/09/2009     Review of Systems  Physical Exam   BP 136/83 mmHg  Pulse 76  Temp(Src) 97.8 F (36.6 C) (Oral)  Wt 147 lb (66.679 kg) Physical Exam  Constitutional:  oriented to person, place, and time. appears well-developed and well-nourished. No distress.  HENT:  Mouth/Throat: Oropharynx is clear and moist. No oropharyngeal exudate.  Cardiovascular: Normal rate, regular rhythm and normal heart sounds. Exam reveals no gallop and no friction rub.  No murmur heard.  Pulmonary/Chest: Effort normal and breath sounds normal. No respiratory distress.  Mild exp wheeze r>L bases predominantly  Abdominal: Soft. Bowel sounds are normal.  exhibits no distension. There is no tenderness.  Lymphadenopathy: no cervical adenopathy.  Neurological: alert and oriented to person, place, and time.  Skin: Skin is warm and dry. No rash noted. No erythema.  Psychiatric: a normal mood and affect.  behavior is normal.   Lab Results  Component Value Date   CD4TCELL 32* 09/22/2013   Lab Results    Component Value Date   CD4TABS 550 09/22/2013   CD4TABS 580 06/17/2013   CD4TABS 500 03/18/2013   Lab Results  Component Value Date   HIV1RNAQUANT <20 09/22/2013   No results found for: HEPBSAB No results found for: RPR  CBC Lab Results  Component Value Date   WBC 3.3* 09/22/2013   RBC 4.87 09/22/2013   HGB 14.7 09/22/2013   HCT 42.6 09/22/2013   PLT 268 09/22/2013   MCV 87.5 09/22/2013   MCH 30.2 09/22/2013   MCHC 34.5 09/22/2013   RDW 15.6* 09/22/2013   LYMPHSABS 1.7 09/22/2013   MONOABS 0.4 09/22/2013   EOSABS 0.2 09/22/2013   BASOSABS 0.0 09/22/2013   BMET Lab Results  Component Value Date   NA 125* 09/22/2013   K 4.5 09/22/2013   CL 92* 09/22/2013   CO2 25 09/22/2013   GLUCOSE 85 09/22/2013   BUN 9 09/22/2013   CREATININE 0.95 09/22/2013   CALCIUM 8.7 09/22/2013   GFRNONAA 68 09/22/2013   GFRAA 79 09/22/2013    Assessment and Plan  hiv = well controlled. Will have her get 6 month labs today  uri = possibly mildly worsening her copd, can continue to treat with albuterol nebs. Will give cough medicine. No need for antibiotics or steroids at this time. She is not tachypnic at this visit, nor exceptionally worse. Pulse ox at 94%.   Asthma/Copd = being evaluated this week by pulmonologist, with pfts.   Health maintenance = will check rpr, lipids, mammo

## 2014-04-14 LAB — T-HELPER CELL (CD4) - (RCID CLINIC ONLY)
CD4 T CELL HELPER: 29 % — AB (ref 33–55)
CD4 T Cell Abs: 420 /uL (ref 400–2700)

## 2014-04-14 LAB — HIV-1 RNA QUANT-NO REFLEX-BLD
HIV 1 RNA QUANT: 25 {copies}/mL — AB (ref <20)
HIV-1 RNA QUANT, LOG: 1.4 {Log} — AB (ref <1.30)

## 2014-04-14 LAB — RPR

## 2014-05-13 ENCOUNTER — Encounter: Payer: Self-pay | Admitting: Pulmonary Disease

## 2014-05-13 ENCOUNTER — Ambulatory Visit (INDEPENDENT_AMBULATORY_CARE_PROVIDER_SITE_OTHER): Payer: Medicaid Other | Admitting: Pulmonary Disease

## 2014-05-13 VITALS — BP 120/60 | HR 70 | Ht 59.0 in | Wt 149.8 lb

## 2014-05-13 DIAGNOSIS — R05 Cough: Secondary | ICD-10-CM

## 2014-05-13 DIAGNOSIS — Z720 Tobacco use: Secondary | ICD-10-CM

## 2014-05-13 DIAGNOSIS — J4530 Mild persistent asthma, uncomplicated: Secondary | ICD-10-CM

## 2014-05-13 DIAGNOSIS — R053 Chronic cough: Secondary | ICD-10-CM

## 2014-05-13 DIAGNOSIS — J411 Mucopurulent chronic bronchitis: Secondary | ICD-10-CM

## 2014-05-13 LAB — PULMONARY FUNCTION TEST
DL/VA % PRED: 96 %
DL/VA: 3.95 ml/min/mmHg/L
DLCO unc % pred: 70 %
DLCO unc: 12.3 ml/min/mmHg
FEF 25-75 POST: 1.75 L/s
FEF 25-75 PRE: 1.52 L/s
FEF2575-%CHANGE-POST: 15 %
FEF2575-%PRED-PRE: 78 %
FEF2575-%Pred-Post: 90 %
FEV1-%Change-Post: 0 %
FEV1-%Pred-Post: 72 %
FEV1-%Pred-Pre: 71 %
FEV1-Post: 1.29 L
FEV1-Pre: 1.29 L
FEV1FVC-%CHANGE-POST: -4 %
FEV1FVC-%PRED-PRE: 109 %
FEV6-%Change-Post: 0 %
FEV6-%PRED-POST: 65 %
FEV6-%Pred-Pre: 66 %
FEV6-Post: 1.43 L
FEV6-Pre: 1.43 L
FEV6FVC-%CHANGE-POST: 0 %
FEV6FVC-%Pred-Post: 104 %
FEV6FVC-%Pred-Pre: 103 %
FVC-%CHANGE-POST: 5 %
FVC-%PRED-POST: 67 %
FVC-%Pred-Pre: 64 %
FVC-PRE: 1.45 L
FVC-Post: 1.52 L
Post FEV1/FVC ratio: 85 %
Post FEV6/FVC ratio: 100 %
Pre FEV1/FVC ratio: 89 %
Pre FEV6/FVC Ratio: 99 %

## 2014-05-13 MED ORDER — ALBUTEROL SULFATE HFA 108 (90 BASE) MCG/ACT IN AERS: INHALATION_SPRAY | RESPIRATORY_TRACT | Status: DC

## 2014-05-13 NOTE — Progress Notes (Signed)
Chief Complaint  Patient presents with  . Follow-up    Review PFT. Pt denies any breathing issues. Reports not having to use nebulizer machine. Pt reports having a shaky/"anxious" feeling every morning when she wakes up - feels d/t wanting/needing a cigarette every morning.     History of Present Illness: [name] is a 54 y.o. female smoker with chronic asthmatic bronchitis.  She is here to review her PFTs.  This did not show COPD.  Instead she had mild restrictive and diffusion defect.  Of note is that she had CT chest in December 2015 which was negative for interstitial lung disease.  She continues to smoke, but is down to 1 pack per day.  She continues to have cough with clear sputum.  She feels that breo and albuterol help.  She does not need to use albuterol much since she started on breo.  She is not interested in stopping smoking.  Her sister reports that if her family doesn't get her cigarettes, then she disappears from the house and only returns when she has somehow procured cigarettes.   TESTS: CT chest 11/06/05 >> b/l hilar/mediastinal LAN CT chest 04/06/14 >> b/l calcified granulomas. PFT 05/13/14 >> FEV1 1.29 (72%), FEV1% 85, TLC 2.96 (68%), DLCO 70%, no BD  PMHx >> GERD, Schizophrenia, HIV, Hepatitis B, PPD positive, Seizures  Past surgical hx, Medications, Allergies, Family hx, Social hx all reviewed.   Physical Exam: Blood pressure 120/60, pulse 70, height 4\' 11"  (1.499 m), weight 149 lb 12.8 oz (67.949 kg), SpO2 95 %. Body mass index is 30.24 kg/(m^2).  General - No distress ENT - No sinus tenderness, no oral exudate, no LAN Cardiac - s1s2 regular, no murmur Chest - No wheeze/rales/dullness Back - No focal tenderness Abd - Soft, non-tender Ext - No edema Neuro - Normal strength Skin - No rashes Psych - normal mood, and behavior  Discussion: She has hx of smoking and chronic productive cough.  She has been labelled as having COPD, but this was not  demonstrated on PFT.  She has mild restrictive and diffusion defect on PFT, but no evidence for interstitial lung disease on recent CT chest >> these findings likely are not of clinical significance.  Assessment/Plan:  Chronic asthmatic bronchitis. Plan: - continue breo and prn albuterol  Tobacco abuse. Had lengthy discuss about the evils of cigarette use, and how her breathing problems are related to smoking. Plan: - discussed options to help with smoking cessation >> nicotine replacement likely best option; likely need to avoid chantix, bupropion in setting of psychiatric disorders and hx of seizures  Hx of HIV. Plan: - she is followed in ID clinic   Coralyn HellingVineet Isra Lindy, MD [facility] Pager:  [phone]

## 2014-05-13 NOTE — Patient Instructions (Signed)
Follow up in 6 months 

## 2014-05-13 NOTE — Progress Notes (Signed)
PFT done today. 

## 2014-05-14 ENCOUNTER — Encounter: Payer: Self-pay | Admitting: Pulmonary Disease

## 2014-05-25 ENCOUNTER — Ambulatory Visit: Payer: Medicaid Other | Admitting: Family Medicine

## 2014-06-03 ENCOUNTER — Other Ambulatory Visit: Payer: Self-pay | Admitting: Internal Medicine

## 2014-06-10 ENCOUNTER — Ambulatory Visit (INDEPENDENT_AMBULATORY_CARE_PROVIDER_SITE_OTHER): Payer: Medicaid Other | Admitting: Family Medicine

## 2014-06-10 ENCOUNTER — Encounter: Payer: Self-pay | Admitting: Family Medicine

## 2014-06-10 VITALS — BP 107/72 | HR 73 | Temp 98.4°F | Ht 59.0 in | Wt 148.7 lb

## 2014-06-10 DIAGNOSIS — R12 Heartburn: Secondary | ICD-10-CM

## 2014-06-10 DIAGNOSIS — R1013 Epigastric pain: Secondary | ICD-10-CM

## 2014-06-10 MED ORDER — RANITIDINE HCL 150 MG PO TABS: 150.0000 mg | ORAL_TABLET | Freq: Two times a day (BID) | ORAL | Status: DC

## 2014-06-10 NOTE — Patient Instructions (Signed)
I sent in ranitidine twice a day for your heartburn Decrease coffee, soda, and smoking. This will help too Follow up in 6 weeks to see how you're doing.  Be well, Dr. Pollie MeyerMcIntyre

## 2014-06-10 NOTE — Progress Notes (Signed)
Patient ID: [name], female   DOB: 12/02/1960, 54 y.o.   MRN: [id number]  HPI:  Abd pain: having heartburn at night, as well abd pain at night. Epigastric area. No nausea or vomiting. Normal stooling and urination. No fevers. Has not tried any medication. States she cannot afford it. Eating and drinking well. Current smoker. Drinks 2 cups coffee per day. Also one soda per day. Does not eat spicy food.  ROS: See HPI.  PMFSH: History of HIV, schizophrenia, hep B infection, COPD  PHYSICAL EXAM: BP 107/72 mmHg  Pulse 73  Temp(Src) 98.4 F (36.9 C) (Oral)  Ht 4\' 11"  (1.499 m)  Wt 148 lb 11.2 oz (67.45 kg)  BMI 30.02 kg/m2 Gen: No acute distress, pleasant, cooperative HEENT: Normocephalic, atraumatic, moist mucous membranes without any oral lesions Heart: Regular rate and rhythm Lungs: Clear to auscultation bilaterally, normal respiratory effort Neuro: At patient's baseline, speaks loudly, gait at baseline, grossly nonfocal Abdomen: Soft, nontender to palpation. Hyperactive bowel sounds. No masses or organomegaly. No peritoneal signs.  ASSESSMENT/PLAN:  Microscopic hematuria Noted patient still had blood in urine when last checked in October (but was dirty catch), did not see this in her chart until after visit. She will need a repeat urinalysis at her follow-up visit in 6 weeks.   Abdominal pain Suspect acid reflux with mild gastritis. Abdominal exam benign, and patient well-appearing. Prescription sent in for ranitidine twice a day. Patient is unsure if she'll be able to afford this. I have recommended in the meantime that she do lifestyle modifications including decreasing her caffeine intake and smoking. She will get the medicine if she is able to. Follow-up in 6 weeks to evaluate for improvement.    FOLLOW UP: F/u in 6 weeks for abdominal pain & heartburn. **Needs UA at that visit**  [name], MD [facility]

## 2014-06-10 NOTE — Assessment & Plan Note (Signed)
Suspect acid reflux with mild gastritis. Abdominal exam benign, and patient well-appearing. Prescription sent in for ranitidine twice a day. Patient is unsure if she'll be able to afford this. I have recommended in the meantime that she do lifestyle modifications including decreasing her caffeine intake and smoking. She will get the medicine if she is able to. Follow-up in 6 weeks to evaluate for improvement.

## 2014-06-10 NOTE — Assessment & Plan Note (Signed)
Noted patient still had blood in urine when last checked in October (but was dirty catch), did not see this in her chart until after visit. She will need a repeat urinalysis at her follow-up visit in 6 weeks.

## 2014-06-10 NOTE — Progress Notes (Signed)
I was preceptor the day of this visit.   

## 2014-06-30 ENCOUNTER — Other Ambulatory Visit: Payer: Self-pay | Admitting: Internal Medicine

## 2014-06-30 ENCOUNTER — Other Ambulatory Visit: Payer: Medicaid Other

## 2014-07-14 ENCOUNTER — Ambulatory Visit (INDEPENDENT_AMBULATORY_CARE_PROVIDER_SITE_OTHER): Payer: Medicaid Other | Admitting: Internal Medicine

## 2014-07-14 ENCOUNTER — Encounter: Payer: Self-pay | Admitting: Internal Medicine

## 2014-07-14 VITALS — BP 114/82 | HR 81 | Temp 97.6°F | Wt 151.0 lb

## 2014-07-14 DIAGNOSIS — Z79899 Other long term (current) drug therapy: Secondary | ICD-10-CM | POA: Diagnosis not present

## 2014-07-14 DIAGNOSIS — Z113 Encounter for screening for infections with a predominantly sexual mode of transmission: Secondary | ICD-10-CM

## 2014-07-14 DIAGNOSIS — B20 Human immunodeficiency virus [HIV] disease: Secondary | ICD-10-CM | POA: Diagnosis present

## 2014-07-14 DIAGNOSIS — J449 Chronic obstructive pulmonary disease, unspecified: Secondary | ICD-10-CM | POA: Diagnosis not present

## 2014-07-14 DIAGNOSIS — Z720 Tobacco use: Secondary | ICD-10-CM | POA: Diagnosis not present

## 2014-07-14 DIAGNOSIS — E871 Hypo-osmolality and hyponatremia: Secondary | ICD-10-CM

## 2014-07-14 DIAGNOSIS — Z716 Tobacco abuse counseling: Secondary | ICD-10-CM

## 2014-07-14 DIAGNOSIS — J4530 Mild persistent asthma, uncomplicated: Secondary | ICD-10-CM | POA: Diagnosis not present

## 2014-07-14 LAB — COMPLETE METABOLIC PANEL WITH GFR
ALK PHOS: 113 U/L (ref 39–117)
ALT: 17 U/L (ref 0–35)
AST: 16 U/L (ref 0–37)
Albumin: 4 g/dL (ref 3.5–5.2)
BILIRUBIN TOTAL: 0.3 mg/dL (ref 0.2–1.2)
BUN: 15 mg/dL (ref 6–23)
CO2: 26 meq/L (ref 19–32)
CREATININE: 0.98 mg/dL (ref 0.50–1.10)
Calcium: 8.9 mg/dL (ref 8.4–10.5)
Chloride: 100 mEq/L (ref 96–112)
GFR, EST NON AFRICAN AMERICAN: 66 mL/min
GFR, Est African American: 76 mL/min
Glucose, Bld: 77 mg/dL (ref 70–99)
Potassium: 4.4 mEq/L (ref 3.5–5.3)
Sodium: 137 mEq/L (ref 135–145)
TOTAL PROTEIN: 6.7 g/dL (ref 6.0–8.3)

## 2014-07-14 LAB — CBC WITH DIFFERENTIAL/PLATELET
Basophils Absolute: 0 10*3/uL (ref 0.0–0.1)
Basophils Relative: 1 % (ref 0–1)
Eosinophils Absolute: 0.1 10*3/uL (ref 0.0–0.7)
Eosinophils Relative: 3 % (ref 0–5)
HCT: 41.8 % (ref 36.0–46.0)
Hemoglobin: 14.1 g/dL (ref 12.0–15.0)
LYMPHS ABS: 2.1 10*3/uL (ref 0.7–4.0)
LYMPHS PCT: 42 % (ref 12–46)
MCH: 31.1 pg (ref 26.0–34.0)
MCHC: 33.7 g/dL (ref 30.0–36.0)
MCV: 92.1 fL (ref 78.0–100.0)
MPV: 9.5 fL (ref 8.6–12.4)
Monocytes Absolute: 0.5 10*3/uL (ref 0.1–1.0)
Monocytes Relative: 10 % (ref 3–12)
NEUTROS ABS: 2.2 10*3/uL (ref 1.7–7.7)
NEUTROS PCT: 44 % (ref 43–77)
Platelets: 218 10*3/uL (ref 150–400)
RBC: 4.54 MIL/uL (ref 3.87–5.11)
RDW: 16.5 % — AB (ref 11.5–15.5)
WBC: 4.9 10*3/uL (ref 4.0–10.5)

## 2014-07-14 LAB — LIPID PANEL
CHOLESTEROL: 144 mg/dL (ref 0–200)
HDL: 70 mg/dL (ref >=46)
LDL CALC: 62 mg/dL (ref 0–99)
Total CHOL/HDL Ratio: 2.1 Ratio
Triglycerides: 58 mg/dL (ref <150)
VLDL: 12 mg/dL (ref 0–40)

## 2014-07-14 LAB — RPR

## 2014-07-14 MED ORDER — ALBUTEROL SULFATE (2.5 MG/3ML) 0.083% IN NEBU: INHALATION_SOLUTION | RESPIRATORY_TRACT | Status: DC

## 2014-07-14 MED ORDER — BECLOMETHASONE DIPROPIONATE 80 MCG/ACT IN AERS: 1.0000 | INHALATION_SPRAY | Freq: Two times a day (BID) | RESPIRATORY_TRACT | Status: DC

## 2014-07-14 NOTE — Progress Notes (Signed)
   Subjective:    Patient ID: Levi Alandawn L Rogoff, female    DOB: 01/13/1961, 54 y.o.   MRN: [id number]  HPI Ms. Tiburcio PeaHarris is a 54 y/o female presenting to clinic for HIV follow up. Her Jan labs show a VL of 25 and CD4 count of 420. She states she is taking her medicines every day without any side effects. She states she is very excited today because she will be getting her dentures. She continues smoking despite her COPD. She has been prescribed Fluticasone which will need to be changed today due to drug-drug interactions with her PI regimen.    Review of Systems 10 point ROS completed.  Resp: Pos for dyspnea and cough. Denies sputum.         Physical Exam Physical Exam  Constitutional: oriented to person, place, and time. appears well-developed and well-nourished. No distress.  HENT:  Mouth/Throat: Oropharynx is clear and moist. No oropharyngeal exudate.  Cardiovascular: Normal rate, regular rhythm and normal heart sounds. Exam reveals no gallop and no friction rub.  No murmur heard.  Pulmonary/Chest: Effort normal and breath sounds normal. No respiratory distress. Mild insp wheeze L>R bases predominantly Abdominal: Soft. Bowel sounds are normal. exhibits no distension. There is no tenderness.  Lymphadenopathy: no cervical adenopathy.  Neurological: alert and oriented to person, place, and time.  Skin: Skin is warm and dry. No rash noted. No erythema.  Psychiatric: a normal mood and affect. behavior is normal.       Assessment & Plan:  Objective:  COPD: Will switch Fluticasone to QVAR due to drug-drug interactions Will send in refill for Albuterol   Tobacco abuse: Discussed smoking cessation X5 minutes Currently smoking 12 cigs/day  HIV:  Continue current regimen Small blip in VL. Redraw labs today  Will need PAP  Hyponatremia: Will redraw BMP to recheck Na levels.

## 2014-07-14 NOTE — Progress Notes (Signed)
Patient ID: [name], female   DOB: 06/28/1960, 54 y.o.   MRN: [id number]       Patient ID: [name], female   DOB: 11/19/1960, 54 y.o.   MRN: [id number]  HPI 54yo F with HIV disease-schizophrenia. CD4 count of 420/VL<20. Currently on RLG/DRVr/truvada. Also has COPD continues to smoke. She states that she is doing well. She is looking forward to getting dentures today. She is in good health, still has productive cough from COPD and chronic smoking. She deneis any recent illnesses.   Outpatient Encounter Prescriptions as of 07/14/2014  Medication Sig  . albuterol (PROAIR HFA) 108 (90 BASE) MCG/ACT inhaler INHALE TWO   PUFFS INTO LUNGS EVERY 6 HOURS AS NEEDED FOR SHORTNESS OF BREATH  . albuterol (PROVENTIL) (2.5 MG/3ML) 0.083% nebulizer solution INHALE CONTENTS OF 1 VIAL USING NEBULIZER EVERY 6 HOURS AS NEEDED FOR WHEEZING  . benztropine (COGENTIN) 0.5 MG tablet Take 0.5 mg by mouth 2 (two) times daily.  . carbamazepine (TEGRETOL) 200 MG tablet Take 200-400 mg by mouth 2 (two) times daily. Take one tablet in the morning and two tablets at bedtime  . Fluticasone Furoate-Vilanterol (BREO ELLIPTA) 100-25 MCG/INH AEPB Inhale 1 puff into the lungs daily.  . ISENTRESS 400 MG tablet TAKE ONE TABLET   BY MOUTH   TWICE A DAY  . NORVIR 100 MG TABS tablet TAKE 1 TABLET BY MOUTH ONCE DAILY  . PREZISTA 800 MG tablet TAKE 1 TABLET BY MOUTH ONCE DAILY WITH BREAKFAST  . QUEtiapine (SEROQUEL) 50 MG tablet Take 50 mg by mouth at bedtime.  . ranitidine (ZANTAC) 150 MG tablet Take 1 tablet (150 mg total) by mouth 2 (two) times daily.  . risperiDONE (RISPERDAL) 1 MG tablet Take 1 mg by mouth daily at 12 noon.  . TRUVADA 200-300 MG per tablet TAKE 1 TABLET BY MOUTH ONCE DAILY  . benztropine mesylate (COGENTIN) 1 MG/ML injection Inject into the muscle once a week.  Marland Kitchen. guaiFENesin-dextromethorphan (ROBITUSSIN DM) 100-10 MG/5ML syrup Take 5 mLs by mouth every 4 (four) hours as needed for cough. (Patient not taking:  Reported on 07/14/2014)  . nicotine (NICODERM CQ) 21 mg/24hr patch Place 1 patch (21 mg total) onto the skin daily. (Patient not taking: Reported on 07/14/2014)  . nystatin (MYCOSTATIN/NYSTOP) 100000 UNIT/GM POWD Apply topically 3 times daily to outside of vulvar area (Patient not taking: Reported on 07/14/2014)  . risperiDONE microspheres (RISPERDAL CONSTA) 50 MG injection Inject 50 mg into the muscle every 14 (fourteen) days.       Patient Active Problem List   Diagnosis Date Noted  . Microscopic hematuria 01/29/2014  . Abdominal pain 12/12/2013  . Vulvar irritation 12/12/2013  . COPD (chronic obstructive pulmonary disease) 02/18/2013  . Tobacco abuse 02/18/2013  . Hepatitis B infection   . Pre-ulcerative corn or callous 05/07/2012  . Urinary incontinence, nocturnal enuresis 05/07/2012  . Fecal incontinence 05/07/2012  . Systolic murmur 05/07/2012  . Routine health maintenance 05/07/2012  . Asthma 08/18/2010  . HIV (human immunodeficiency virus infection) 08/18/2010  . Schizophrenia 08/18/2010  . Herpes simplex infection 08/18/2010     Health Maintenance Due  Topic Date Due  . COLONOSCOPY  09/09/2009     Review of Systems 10 point ros is negative other than chornic cough Physical Exam   BP 114/82 mmHg  Pulse 81  Temp(Src) 97.6 F (36.4 C) (Oral)  Wt 151 lb (68.493 kg) Physical Exam  Constitutional:  oriented to person, place, and time. appears well-developed and  well-nourished. No distress.  HENT: [place]/AT, PERRLA, no scleral icterus Mouth/Throat: Oropharynx is clear and moist. No oropharyngeal exudate.  Cardiovascular: Normal rate, regular rhythm and normal heart sounds. Exam reveals no gallop and no friction rub.  No murmur heard.  Pulmonary/Chest: Effort normal and breath sounds normal. No respiratory distress.  has no wheezes.  Neck = supple, no nuchal rigidity Lymphadenopathy: no cervical adenopathy. No axillary adenopathy Neurological: alert and oriented to person,  place, and time.  Skin: Skin is warm and dry. No rash noted. No erythema.  Psychiatric: a normal mood and affect.  behavior is normal.   Lab Results  Component Value Date   CD4TCELL 29* 04/13/2014   Lab Results  Component Value Date   CD4TABS 420 04/13/2014   CD4TABS 550 09/22/2013   CD4TABS 580 06/17/2013   Lab Results  Component Value Date   HIV1RNAQUANT 25* 04/13/2014   No results found for: HEPBSAB No results found for: RPR  CBC Lab Results  Component Value Date   WBC 4.1 04/13/2014   RBC 4.90 04/13/2014   HGB 15.2* 04/13/2014   HCT 45.2 04/13/2014   PLT 248 04/13/2014   MCV 92.2 04/13/2014   MCH 31.0 04/13/2014   MCHC 33.6 04/13/2014   RDW 15.7* 04/13/2014   LYMPHSABS 1.6 04/13/2014   MONOABS 0.5 04/13/2014   EOSABS 0.1 04/13/2014   BASOSABS 0.0 04/13/2014   BMET Lab Results  Component Value Date   NA 131* 04/13/2014   K 4.8 04/13/2014   CL 96 04/13/2014   CO2 25 04/13/2014   GLUCOSE 81 04/13/2014   BUN 10 04/13/2014   CREATININE 0.97 04/13/2014   CALCIUM 9.5 04/13/2014   GFRNONAA 66 04/13/2014   GFRAA 77 04/13/2014     Assessment and Plan  hiv disease= will continue with current medicaiton. Also check labs today  Copd = will have discontinue fluticasone based inhaler and change to QVAr in order to minimize drug interaction with protease inhibitor/darunavir  Smoking cessation = spent 3 min in counseling for smoking cessation   Health maintenance = will need pap  Hyponatremia = likely due to psych meds. Will check bmp

## 2014-07-15 LAB — T-HELPER CELL (CD4) - (RCID CLINIC ONLY)
CD4 % Helper T Cell: 32 % — ABNORMAL LOW (ref 33–55)
CD4 T Cell Abs: 670 /uL (ref 400–2700)

## 2014-07-15 LAB — HIV-1 RNA QUANT-NO REFLEX-BLD: HIV 1 RNA Quant: <20 copies/mL (ref <20)

## 2014-07-24 ENCOUNTER — Ambulatory Visit (INDEPENDENT_AMBULATORY_CARE_PROVIDER_SITE_OTHER): Payer: Medicaid Other | Admitting: Family Medicine

## 2014-07-24 ENCOUNTER — Encounter: Payer: Self-pay | Admitting: Family Medicine

## 2014-07-24 VITALS — BP 118/69 | HR 85 | Temp 97.8°F | Ht 59.0 in | Wt 152.1 lb

## 2014-07-24 DIAGNOSIS — R1013 Epigastric pain: Secondary | ICD-10-CM

## 2014-07-24 DIAGNOSIS — R3129 Other microscopic hematuria: Secondary | ICD-10-CM

## 2014-07-24 DIAGNOSIS — R312 Other microscopic hematuria: Secondary | ICD-10-CM | POA: Diagnosis not present

## 2014-07-24 LAB — POCT URINALYSIS DIPSTICK
Bilirubin, UA: NEGATIVE
Glucose, UA: NEGATIVE
Ketones, UA: NEGATIVE
Leukocytes, UA: NEGATIVE
Nitrite, UA: NEGATIVE
PH UA: 6
RBC UA: NEGATIVE
SPEC GRAV UA: 1.025
Urobilinogen, UA: 0.2

## 2014-07-24 MED ORDER — RANITIDINE HCL 150 MG PO TABS: 150.0000 mg | ORAL_TABLET | Freq: Two times a day (BID) | ORAL | Status: DC

## 2014-07-24 NOTE — Progress Notes (Signed)
Patient ID: [name], female   DOB: 01/27/1961, 54 y.o.   MRN: [id number]  HPI:  Follow up GERD: Has been taking ranitidine 150 mg twice a day. States her stomach pain is gone. She appreciates this medicine would like to continue it. She is eating and drinking well.  Follow-up hematuria: Denies any gross hematuria or dysuria. Is urinating well. Has no urinary complaints.  ROS: See HPI.  PMFSH: History of HIV, hepatitis B, schizophrenia, asthma/COPD  PHYSICAL EXAM: BP 118/69 mmHg  Pulse 85  Temp(Src) 97.8 F (36.6 C) (Oral)  Ht 4\' 11"  (1.499 m)  Wt 152 lb 1.6 oz (68.992 kg)  BMI 30.70 kg/m2 Gen: No acute distress, pleasant, cooperative, well-appearing Abdomen: Soft, nontender to palpation, no masses or organomegaly, no suprapubic tenderness  Lungs: Normal respiratory effort  ASSESSMENT/PLAN:  Health maintenance:  -Patient reports upcoming appointment with her GYN for GYN exam.  Abdominal pain Improved after beginning ranitidine. Continue this medication. Follow-up in 3 months.   Microscopic hematuria Recheck another clean-catch urinalysis today to be sure that hematuria not persistent. The last time we tried to do this it was not a clean catch.    FOLLOW UP: F/u in 3 mos for GERD  [name], MD [facility]

## 2014-07-24 NOTE — Patient Instructions (Signed)
Rechecking urine today I refilled your acid reflux medicine  Follow up in 3 months  Be well, Dr. Pollie MeyerMcIntyre

## 2014-07-24 NOTE — Assessment & Plan Note (Signed)
Improved after beginning ranitidine. Continue this medication. Follow-up in 3 months.

## 2014-07-24 NOTE — Assessment & Plan Note (Signed)
Recheck another clean-catch urinalysis today to be sure that hematuria not persistent. The last time we tried to do this it was not a clean catch.

## 2014-07-27 NOTE — Progress Notes (Signed)
I was preceptor the day of this visit.   

## 2014-07-31 ENCOUNTER — Ambulatory Visit (INDEPENDENT_AMBULATORY_CARE_PROVIDER_SITE_OTHER): Payer: Medicaid Other | Admitting: *Deleted

## 2014-07-31 ENCOUNTER — Other Ambulatory Visit (HOSPITAL_COMMUNITY)
Admission: RE | Admit: 2014-07-31 | Discharge: 2014-07-31 | Disposition: A | Discharge status: Home / Self Care | Payer: Medicaid Other | Source: Ambulatory Visit | Attending: Internal Medicine | Admitting: Internal Medicine

## 2014-07-31 DIAGNOSIS — Z113 Encounter for screening for infections with a predominantly sexual mode of transmission: Secondary | ICD-10-CM

## 2014-07-31 DIAGNOSIS — Z124 Encounter for screening for malignant neoplasm of cervix: Secondary | ICD-10-CM

## 2014-07-31 DIAGNOSIS — Z01419 Encounter for gynecological examination (general) (routine) without abnormal findings: Secondary | ICD-10-CM | POA: Diagnosis not present

## 2014-07-31 NOTE — Progress Notes (Signed)
  Subjective:     [name] is a 54 y.o. woman who comes in today for a  pap smear only. Previous abnormal Pap smears: no. Contraception: menopause.  Objective:    There were no vitals taken for this visit. Pelvic Exam:  Pap smear obtained.   Assessment:    Screening pap smear.   Plan:    Follow up in one year, or as indicated by Pap results.   Pt given educational materials re: HIV and women, self-esteem, BSE, nutrition and diet management, PAP smears and partner safety. Pt given condoms.

## 2014-07-31 NOTE — Patient Instructions (Signed)
Your results will be ready in about a week.  I will mail them to you.  Thank you for coming to the Center for your care.  Denise,  RN 

## 2014-08-03 LAB — CYTOLOGY - PAP

## 2014-08-06 ENCOUNTER — Other Ambulatory Visit: Payer: Self-pay | Admitting: Internal Medicine

## 2014-08-06 ENCOUNTER — Encounter: Payer: Self-pay | Admitting: *Deleted

## 2014-08-06 DIAGNOSIS — B20 Human immunodeficiency virus [HIV] disease: Secondary | ICD-10-CM

## 2014-09-04 ENCOUNTER — Other Ambulatory Visit: Payer: Self-pay | Admitting: *Deleted

## 2014-09-04 DIAGNOSIS — B20 Human immunodeficiency virus [HIV] disease: Secondary | ICD-10-CM

## 2014-09-04 MED ORDER — EMTRICITABINE-TENOFOVIR DF 200-300 MG PO TABS: 1.0000 | ORAL_TABLET | Freq: Every day | ORAL | Status: DC

## 2014-09-04 MED ORDER — DARUNAVIR ETHANOLATE 800 MG PO TABS: ORAL_TABLET | ORAL | Status: DC

## 2014-09-04 MED ORDER — RALTEGRAVIR POTASSIUM 400 MG PO TABS: ORAL_TABLET | ORAL | Status: DC

## 2014-09-30 ENCOUNTER — Other Ambulatory Visit: Payer: Self-pay | Admitting: *Deleted

## 2014-09-30 DIAGNOSIS — B20 Human immunodeficiency virus [HIV] disease: Secondary | ICD-10-CM

## 2014-09-30 MED ORDER — RALTEGRAVIR POTASSIUM 400 MG PO TABS: ORAL_TABLET | ORAL | Status: DC

## 2014-09-30 MED ORDER — DARUNAVIR ETHANOLATE 800 MG PO TABS: ORAL_TABLET | ORAL | Status: DC

## 2014-09-30 MED ORDER — EMTRICITABINE-TENOFOVIR DF 200-300 MG PO TABS: 1.0000 | ORAL_TABLET | Freq: Every day | ORAL | Status: DC

## 2014-09-30 MED ORDER — RITONAVIR 100 MG PO TABS: ORAL_TABLET | ORAL | Status: DC

## 2014-10-10 ENCOUNTER — Emergency Department (HOSPITAL_COMMUNITY): Payer: Medicaid Other

## 2014-10-10 ENCOUNTER — Encounter (HOSPITAL_COMMUNITY): Payer: Self-pay | Admitting: Emergency Medicine

## 2014-10-10 ENCOUNTER — Emergency Department (HOSPITAL_COMMUNITY)
Admission: EM | Admit: 2014-10-10 | Discharge: 2014-10-10 | Disposition: A | Discharge status: Other Acute Inpatient Hospital | Payer: Medicaid Other | Attending: Emergency Medicine | Admitting: Emergency Medicine

## 2014-10-10 ENCOUNTER — Emergency Department (HOSPITAL_COMMUNITY)
Admission: EM | Admit: 2014-10-10 | Discharge: 2014-10-11 | Disposition: A | Discharge status: Home / Self Care | Payer: Medicaid Other | Source: Home / Self Care | Attending: Emergency Medicine | Admitting: Emergency Medicine

## 2014-10-10 DIAGNOSIS — B20 Human immunodeficiency virus [HIV] disease: Secondary | ICD-10-CM | POA: Diagnosis not present

## 2014-10-10 DIAGNOSIS — J449 Chronic obstructive pulmonary disease, unspecified: Secondary | ICD-10-CM

## 2014-10-10 DIAGNOSIS — K219 Gastro-esophageal reflux disease without esophagitis: Secondary | ICD-10-CM | POA: Insufficient documentation

## 2014-10-10 DIAGNOSIS — T22111A Burn of first degree of right forearm, initial encounter: Secondary | ICD-10-CM | POA: Diagnosis not present

## 2014-10-10 DIAGNOSIS — Z8669 Personal history of other diseases of the nervous system and sense organs: Secondary | ICD-10-CM | POA: Insufficient documentation

## 2014-10-10 DIAGNOSIS — Y93G9 Activity, other involving cooking and grilling: Secondary | ICD-10-CM | POA: Diagnosis not present

## 2014-10-10 DIAGNOSIS — Z79899 Other long term (current) drug therapy: Secondary | ICD-10-CM

## 2014-10-10 DIAGNOSIS — Y998 Other external cause status: Secondary | ICD-10-CM | POA: Insufficient documentation

## 2014-10-10 DIAGNOSIS — X58XXXA Exposure to other specified factors, initial encounter: Secondary | ICD-10-CM | POA: Diagnosis not present

## 2014-10-10 DIAGNOSIS — T22112A Burn of first degree of left forearm, initial encounter: Secondary | ICD-10-CM | POA: Diagnosis not present

## 2014-10-10 DIAGNOSIS — F1210 Cannabis abuse, uncomplicated: Secondary | ICD-10-CM | POA: Diagnosis not present

## 2014-10-10 DIAGNOSIS — F209 Schizophrenia, unspecified: Secondary | ICD-10-CM | POA: Diagnosis not present

## 2014-10-10 DIAGNOSIS — Z720 Tobacco use: Secondary | ICD-10-CM | POA: Insufficient documentation

## 2014-10-10 DIAGNOSIS — Z9089 Acquired absence of other organs: Secondary | ICD-10-CM | POA: Diagnosis not present

## 2014-10-10 DIAGNOSIS — Z008 Encounter for other general examination: Secondary | ICD-10-CM | POA: Diagnosis present

## 2014-10-10 DIAGNOSIS — E871 Hypo-osmolality and hyponatremia: Secondary | ICD-10-CM | POA: Insufficient documentation

## 2014-10-10 DIAGNOSIS — J45909 Unspecified asthma, uncomplicated: Secondary | ICD-10-CM | POA: Diagnosis not present

## 2014-10-10 DIAGNOSIS — Z7951 Long term (current) use of inhaled steroids: Secondary | ICD-10-CM | POA: Insufficient documentation

## 2014-10-10 DIAGNOSIS — T22131A Burn of first degree of right upper arm, initial encounter: Secondary | ICD-10-CM | POA: Diagnosis not present

## 2014-10-10 DIAGNOSIS — T300 Burn of unspecified body region, unspecified degree: Secondary | ICD-10-CM

## 2014-10-10 DIAGNOSIS — Y9289 Other specified places as the place of occurrence of the external cause: Secondary | ICD-10-CM | POA: Diagnosis not present

## 2014-10-10 DIAGNOSIS — R410 Disorientation, unspecified: Secondary | ICD-10-CM

## 2014-10-10 DIAGNOSIS — H269 Unspecified cataract: Secondary | ICD-10-CM | POA: Insufficient documentation

## 2014-10-10 LAB — RAPID URINE DRUG SCREEN, HOSP PERFORMED
AMPHETAMINES: NOT DETECTED
Amphetamines: NOT DETECTED
BARBITURATES: NOT DETECTED
BENZODIAZEPINES: NOT DETECTED
Barbiturates: NOT DETECTED
Benzodiazepines: NOT DETECTED
Cocaine: NOT DETECTED
Cocaine: NOT DETECTED
OPIATES: NOT DETECTED
Opiates: NOT DETECTED
TETRAHYDROCANNABINOL: POSITIVE — AB
TETRAHYDROCANNABINOL: NOT DETECTED

## 2014-10-10 LAB — CBC
HCT: 44.7 % (ref 36.0–46.0)
HCT: 41.2 % (ref 36.0–46.0)
HEMOGLOBIN: 14.2 g/dL (ref 12.0–15.0)
Hemoglobin: 15.4 g/dL — ABNORMAL HIGH (ref 12.0–15.0)
MCH: 31.2 pg (ref 26.0–34.0)
MCH: 31 pg (ref 26.0–34.0)
MCHC: 34.5 g/dL (ref 30.0–36.0)
MCHC: 34.5 g/dL (ref 30.0–36.0)
MCV: 90.5 fL (ref 78.0–100.0)
MCV: 90 fL (ref 78.0–100.0)
Platelets: 222 10*3/uL (ref 150–400)
Platelets: 232 10*3/uL (ref 150–400)
RBC: 4.94 MIL/uL (ref 3.87–5.11)
RBC: 4.58 MIL/uL (ref 3.87–5.11)
RDW: 13.3 % (ref 11.5–15.5)
RDW: 13.1 % (ref 11.5–15.5)
WBC: 5.8 10*3/uL (ref 4.0–10.5)
WBC: 6.6 10*3/uL (ref 4.0–10.5)

## 2014-10-10 LAB — COMPREHENSIVE METABOLIC PANEL
ALK PHOS: 114 U/L (ref 38–126)
ALT: 27 U/L (ref 14–54)
ALT: 25 U/L (ref 14–54)
ANION GAP: 10 (ref 5–15)
AST: 38 U/L (ref 15–41)
AST: 33 U/L (ref 15–41)
Albumin: 4.4 g/dL (ref 3.5–5.0)
Albumin: 4 g/dL (ref 3.5–5.0)
Alkaline Phosphatase: 123 U/L (ref 38–126)
Anion gap: 11 (ref 5–15)
BUN: 9 mg/dL (ref 6–20)
BUN: 8 mg/dL (ref 6–20)
CO2: 25 mmol/L (ref 22–32)
CO2: 24 mmol/L (ref 22–32)
Calcium: 9.2 mg/dL (ref 8.9–10.3)
Calcium: 8.7 mg/dL — ABNORMAL LOW (ref 8.9–10.3)
Chloride: 92 mmol/L — ABNORMAL LOW (ref 101–111)
Chloride: 92 mmol/L — ABNORMAL LOW (ref 101–111)
Creatinine, Ser: 1.11 mg/dL — ABNORMAL HIGH (ref 0.44–1.00)
Creatinine, Ser: 0.88 mg/dL (ref 0.44–1.00)
GFR calc Af Amer: >60 mL/min (ref >60)
GFR calc non Af Amer: 55 mL/min — ABNORMAL LOW (ref >60)
GFR calc non Af Amer: >60 mL/min (ref >60)
GLUCOSE: 112 mg/dL — AB (ref 65–99)
Glucose, Bld: 90 mg/dL (ref 65–99)
POTASSIUM: 4.5 mmol/L (ref 3.5–5.1)
POTASSIUM: 4.2 mmol/L (ref 3.5–5.1)
Sodium: 127 mmol/L — ABNORMAL LOW (ref 135–145)
Sodium: 127 mmol/L — ABNORMAL LOW (ref 135–145)
Total Bilirubin: 0.2 mg/dL — ABNORMAL LOW (ref 0.3–1.2)
Total Bilirubin: 0.3 mg/dL (ref 0.3–1.2)
Total Protein: 8 g/dL (ref 6.5–8.1)
Total Protein: 7.3 g/dL (ref 6.5–8.1)

## 2014-10-10 LAB — ETHANOL
Alcohol, Ethyl (B): <5 mg/dL (ref <5)
Alcohol, Ethyl (B): <5 mg/dL (ref <5)

## 2014-10-10 LAB — SALICYLATE LEVEL: Salicylate Lvl: <4 mg/dL (ref 2.8–30.0)

## 2014-10-10 LAB — I-STAT CHEM 8, ED
BUN: 7 mg/dL (ref 6–20)
CREATININE: 1 mg/dL (ref 0.44–1.00)
Calcium, Ion: 1.1 mmol/L — ABNORMAL LOW (ref 1.12–1.23)
Chloride: 94 mmol/L — ABNORMAL LOW (ref 101–111)
GLUCOSE: 91 mg/dL (ref 65–99)
HCT: 49 % — ABNORMAL HIGH (ref 36.0–46.0)
Hemoglobin: 16.7 g/dL — ABNORMAL HIGH (ref 12.0–15.0)
POTASSIUM: 4.4 mmol/L (ref 3.5–5.1)
Sodium: 128 mmol/L — ABNORMAL LOW (ref 135–145)
TCO2: 26 mmol/L (ref 0–100)

## 2014-10-10 LAB — ACETAMINOPHEN LEVEL
Acetaminophen (Tylenol), Serum: <10 ug/mL — ABNORMAL LOW (ref 10–30)
Acetaminophen (Tylenol), Serum: <10 ug/mL — ABNORMAL LOW (ref 10–30)

## 2014-10-10 MED ORDER — SODIUM CHLORIDE 0.9 % IV BOLUS (SEPSIS)
1000.0000 mL | Freq: Once | INTRAVENOUS | Status: AC
  Administered 2014-10-10: 1000 mL via INTRAVENOUS

## 2014-10-10 MED ORDER — BACITRACIN ZINC 500 UNIT/GM EX OINT
1.0000 "application " | TOPICAL_OINTMENT | Freq: Two times a day (BID) | CUTANEOUS | Status: DC
  Administered 2014-10-10: 1 via TOPICAL
  Filled 2014-10-10: qty 0.9

## 2014-10-10 NOTE — ED Notes (Signed)
Return call to [place] at [facility] who confirmed receipt of the patients records from this visit.  She informed me that she has contacted their officer for transport back to them.  She has no ETA for me at this time.
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
## 2014-10-10 NOTE — ED Notes (Signed)
Secretary, Misty StanleyLisa, [facility] as [facility].

## 2014-10-10 NOTE — ED Notes (Signed)
Per Alexia FreestonePatty, RN and Sharma CovertAC Jennifer, RN - no EMTALA needed  Paperwork was faxed earlier today

## 2014-10-10 NOTE — ED Notes (Signed)
Called [facility] regarding [facility] of patient back to their facility.  Trula OreChristina is making call to their officer now and will call me "right back".  I emphasized the urgency of getting this patient back to them as we have been waiting several hours.  Information acknowledged.  I informed her that she should speak to me directly or with Alexia FreestonePatty, RN our charge nurse.

## 2014-10-10 NOTE — ED Provider Notes (Signed)
  Physical Exam  BP 154/78 mmHg  Pulse 79  Temp(Src) 97.8 F (36.6 C) (Oral)  Resp 20  SpO2 96%  Physical Exam I have not seen or examined the patient. ED Course  Procedures Patient was not signed out to me but I was told by nursing staff the patient needed to be medically cleared. I was told to order fluids due to low sodium. The patient has chronic hyponatremia and I do not feel that were agreeable with a fluids will help with this immediately. Otherwise, her labs are not concerning and she has been medically cleared.       Catha GosselinHanna Patel-Mills, PA-C 10/10/14 1025  Loren Raceravid Yelverton, MD 10/13/14 [id number]

## 2014-10-10 NOTE — ED Notes (Signed)
[name] to [facility] at [facility] pm

## 2014-10-10 NOTE — ED Notes (Addendum)
[facility] from [facility]. Sent back for abnormal bloodwork. Given fluid today while in ED. Pt is alert and ambulatory. No other c/c.
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
## 2014-10-10 NOTE — ED Notes (Signed)
[facility], she will follow up with [facility] and call back to let me know an approx time.

## 2014-10-10 NOTE — ED Notes (Signed)
Per Hanna,PA - pt is medically cleared [facility]. [facility] and [facility].  She requested that we fax cc of records.
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
## 2014-10-10 NOTE — ED Notes (Signed)
Al [facility] Hensley [facility] called to verify they will arrive a few min after 6pm to transport pt. [phone]
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
## 2014-10-11 MED ORDER — NICOTINE 21 MG/24HR TD PT24: 21.0000 mg | MEDICATED_PATCH | Freq: Every day | TRANSDERMAL | Status: DC

## 2014-10-11 MED ORDER — ALUM & MAG HYDROXIDE-SIMETH 200-200-20 MG/5ML PO SUSP: 30.0000 mL | ORAL | Status: DC | PRN

## 2014-10-11 MED ORDER — SODIUM CHLORIDE 0.9 % IV SOLN
Freq: Once | INTRAVENOUS | Status: AC
  Administered 2014-10-11: 100 mL/h via INTRAVENOUS

## 2014-10-11 MED ORDER — ONDANSETRON HCL 4 MG PO TABS: 4.0000 mg | ORAL_TABLET | Freq: Three times a day (TID) | ORAL | Status: DC | PRN

## 2014-10-11 NOTE — BH Assessment (Signed)
0214:  Consulted with Renne CriglerJoshua Geiple, PA-C about the Patient.  Reports [facility]  [facility].  Patient has a history Schizophrenia.  Patient has current low sodium level.  Possible medical delirium.  [id number] - 0240:  Assessment  0241:  Consulted with Extender Maryjean Mornharles Kober, PA-C.  Per Rito EhrlichExtender Kober; Patient does not meet inpatient criteria.  0250:  Provided Patient's disposition to Renne CriglerJoshua Geiple, PA-C.

## 2014-10-11 NOTE — ED Notes (Signed)
TTS consult in process at this time (in consultation room).
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
## 2014-10-11 NOTE — Discharge Instructions (Signed)
Please read and follow all provided instructions.  Your diagnoses today include:  1. Chronic hyponatremia   2. Confusion    Tests performed today include:  Blood counts and electrolytes - shows low sodium  Vital signs. See below for your results today.   Medications prescribed:   None  Take any prescribed medications only as directed.  Home care instructions:  Follow any educational materials contained in this packet.  BE VERY CAREFUL not to take multiple medicines containing Tylenol (also called acetaminophen). Doing so can lead to an overdose which can damage your liver and cause liver failure and possibly death.   Follow-up instructions: Please follow-up with your primary care provider in the next 3 days for further evaluation of your symptoms.   Return instructions:   Please return to the Emergency Department if you experience worsening symptoms.   Please return if you have any other emergent concerns.  Additional Information:  Your vital signs today were: BP 130/74 mmHg   Pulse 70   Temp(Src) 98.2 F (36.8 C) (Oral)   Resp 18   SpO2 95% If your blood pressure (BP) was elevated above 135/85 this visit, please have this repeated by your doctor within one month. --------------

## 2014-10-13 ENCOUNTER — Ambulatory Visit: Payer: Medicaid Other | Admitting: Internal Medicine

## 2014-10-20 ENCOUNTER — Ambulatory Visit: Payer: Medicaid Other | Admitting: Family Medicine

## 2014-10-22 ENCOUNTER — Encounter: Payer: Self-pay | Admitting: Family Medicine

## 2014-10-22 ENCOUNTER — Ambulatory Visit (INDEPENDENT_AMBULATORY_CARE_PROVIDER_SITE_OTHER): Payer: Medicaid Other | Admitting: Family Medicine

## 2014-10-22 VITALS — BP 135/90 | HR 89 | Temp 97.9°F | Ht 59.0 in | Wt 140.0 lb

## 2014-10-22 DIAGNOSIS — E871 Hypo-osmolality and hyponatremia: Secondary | ICD-10-CM | POA: Diagnosis not present

## 2014-10-22 DIAGNOSIS — R1084 Generalized abdominal pain: Secondary | ICD-10-CM

## 2014-10-22 MED ORDER — POLYETHYLENE GLYCOL 3350 17 GM/SCOOP PO POWD: 17.0000 g | Freq: Every day | ORAL | Status: DC

## 2014-10-22 MED ORDER — RANITIDINE HCL 150 MG PO TABS: 150.0000 mg | ORAL_TABLET | Freq: Two times a day (BID) | ORAL | Status: DC
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
## 2014-10-22 NOTE — Patient Instructions (Addendum)
Take zantac twice a day for heartburn Take miralax daily for bowel movements. If you have too many bowel movements you can back off and not take it every day.  Checking some bloodwork today  Follow up with me in 3-4 weeks  Be well, Dr. Pollie MeyerMcIntyre

## 2014-10-22 NOTE — Assessment & Plan Note (Addendum)
Appears euvolemic today. Noted to be hyponatremic during ER visit, with sodium as low as 127. ER visit makes note of chronic hyponatremia, however patient had normal Na of 137 just three months ago. She has had low sodium values in the past prior to that, upon chart review. Timeline of hyopnatremia unclear to me. Obviously it would be concerning if she is newly hyponatremic with mental status changes. Unclear if this is related to metabolic process or exacerbation of underlying serious psychiatric illness. I will repeat her BMET today to see where we stand with her sodium level at this time. She has ongoing ACT team involvement, says she feels well, and denies any thoughts of harming herself or others, so I do not see a reason for emergent evaluation or hospitalization today. Will await BMET results before deciding on further workup.

## 2014-10-22 NOTE — Assessment & Plan Note (Signed)
Abdominal exam is benign today, no signs of acute abdomen. Recurrence of heartburn after stopping ranitidine. Will refill. Also rx miralax for daily use to improve frequency of bowel movements as this will also likely help her abdominal discomfort. Return in 3-4 weeks, sooner if worsening or not improving. Pt and ACT worker agreeable to this plan.

## 2014-10-23 ENCOUNTER — Telehealth: Payer: Self-pay | Admitting: Family Medicine

## 2014-10-23 DIAGNOSIS — E875 Hyperkalemia: Secondary | ICD-10-CM

## 2014-10-23 LAB — BASIC METABOLIC PANEL
BUN: 11 mg/dL (ref 6–23)
CALCIUM: 9 mg/dL (ref 8.4–10.5)
CHLORIDE: 93 meq/L — AB (ref 96–112)
CO2: 22 meq/L (ref 19–32)
Creat: 1.13 mg/dL — ABNORMAL HIGH (ref 0.50–1.10)
Glucose, Bld: 94 mg/dL (ref 70–99)
POTASSIUM: 5.7 meq/L — AB (ref 3.5–5.3)
Sodium: 130 mEq/L — ABNORMAL LOW (ref 135–145)

## 2014-10-23 NOTE — Telephone Encounter (Signed)
Called patient to discuss labs. Sodium improved to 130 but potassium. Specimen hemolyzed so this is likely the reason but I would like to recheck this to be sure.  Pt states she's doing well. I asked her to come back in today for repeat BMET to ensure potassium normalized. Her brother is her source of transportation and stated there is no way she can possibly come in today for labwork due to their schedule, despite my urging that it would be very good to repeat this today. Lab appointment instead scheduled for Monday.  Latrelle DodrillBrittany J Hinata Diener, MD

## 2014-10-26 ENCOUNTER — Other Ambulatory Visit: Payer: Medicaid Other

## 2014-10-29 ENCOUNTER — Other Ambulatory Visit: Payer: Medicaid Other

## 2014-10-29 ENCOUNTER — Emergency Department (HOSPITAL_COMMUNITY)
Admission: EM | Admit: 2014-10-29 | Discharge: 2014-10-30 | Disposition: A | Discharge status: Other Acute Inpatient Hospital | Payer: Medicaid Other | Attending: Emergency Medicine | Admitting: Emergency Medicine

## 2014-10-29 ENCOUNTER — Telehealth: Payer: Self-pay | Admitting: Family Medicine

## 2014-10-29 ENCOUNTER — Encounter (HOSPITAL_COMMUNITY): Payer: Self-pay | Admitting: *Deleted

## 2014-10-29 DIAGNOSIS — F29 Unspecified psychosis not due to a substance or known physiological condition: Secondary | ICD-10-CM | POA: Diagnosis not present

## 2014-10-29 DIAGNOSIS — Z79899 Other long term (current) drug therapy: Secondary | ICD-10-CM | POA: Diagnosis not present

## 2014-10-29 DIAGNOSIS — J45909 Unspecified asthma, uncomplicated: Secondary | ICD-10-CM | POA: Diagnosis not present

## 2014-10-29 DIAGNOSIS — B20 Human immunodeficiency virus [HIV] disease: Secondary | ICD-10-CM | POA: Insufficient documentation

## 2014-10-29 DIAGNOSIS — H269 Unspecified cataract: Secondary | ICD-10-CM | POA: Insufficient documentation

## 2014-10-29 DIAGNOSIS — Z720 Tobacco use: Secondary | ICD-10-CM | POA: Diagnosis not present

## 2014-10-29 DIAGNOSIS — K219 Gastro-esophageal reflux disease without esophagitis: Secondary | ICD-10-CM | POA: Insufficient documentation

## 2014-10-29 DIAGNOSIS — F209 Schizophrenia, unspecified: Secondary | ICD-10-CM | POA: Insufficient documentation

## 2014-10-29 DIAGNOSIS — Z7951 Long term (current) use of inhaled steroids: Secondary | ICD-10-CM | POA: Insufficient documentation

## 2014-10-29 DIAGNOSIS — Z8639 Personal history of other endocrine, nutritional and metabolic disease: Secondary | ICD-10-CM | POA: Diagnosis not present

## 2014-10-29 DIAGNOSIS — E875 Hyperkalemia: Secondary | ICD-10-CM

## 2014-10-29 DIAGNOSIS — Z9049 Acquired absence of other specified parts of digestive tract: Secondary | ICD-10-CM | POA: Diagnosis not present

## 2014-10-29 DIAGNOSIS — Z008 Encounter for other general examination: Secondary | ICD-10-CM | POA: Diagnosis present

## 2014-10-29 LAB — COMPREHENSIVE METABOLIC PANEL
ALBUMIN: 4 g/dL (ref 3.5–5.0)
ALT: 18 U/L (ref 14–54)
AST: 22 U/L (ref 15–41)
Alkaline Phosphatase: 108 U/L (ref 38–126)
Anion gap: 7 (ref 5–15)
BUN: 20 mg/dL (ref 6–20)
CALCIUM: 9.1 mg/dL (ref 8.9–10.3)
CHLORIDE: 107 mmol/L (ref 101–111)
CO2: 23 mmol/L (ref 22–32)
Creatinine, Ser: 0.99 mg/dL (ref 0.44–1.00)
GFR calc Af Amer: >60 mL/min (ref >60)
GFR calc non Af Amer: >60 mL/min (ref >60)
Glucose, Bld: 88 mg/dL (ref 65–99)
Potassium: 3.9 mmol/L (ref 3.5–5.1)
Sodium: 137 mmol/L (ref 135–145)
Total Bilirubin: 0.5 mg/dL (ref 0.3–1.2)
Total Protein: 7.6 g/dL (ref 6.5–8.1)

## 2014-10-29 LAB — RAPID URINE DRUG SCREEN, HOSP PERFORMED
Amphetamines: NOT DETECTED
Barbiturates: NOT DETECTED
Benzodiazepines: NOT DETECTED
Cocaine: NOT DETECTED
Opiates: NOT DETECTED
TETRAHYDROCANNABINOL: NOT DETECTED

## 2014-10-29 LAB — CBC
HCT: 44.1 % (ref 36.0–46.0)
Hemoglobin: 15.1 g/dL — ABNORMAL HIGH (ref 12.0–15.0)
MCH: 31.4 pg (ref 26.0–34.0)
MCHC: 34.2 g/dL (ref 30.0–36.0)
MCV: 91.7 fL (ref 78.0–100.0)
Platelets: 246 10*3/uL (ref 150–400)
RBC: 4.81 MIL/uL (ref 3.87–5.11)
RDW: 13.9 % (ref 11.5–15.5)
WBC: 6.2 10*3/uL (ref 4.0–10.5)

## 2014-10-29 LAB — BASIC METABOLIC PANEL
BUN: 21 mg/dL (ref 6–23)
CO2: 20 meq/L (ref 19–32)
Calcium: 9.5 mg/dL (ref 8.4–10.5)
Chloride: 104 mEq/L (ref 96–112)
Creat: 0.96 mg/dL (ref 0.50–1.10)
Glucose, Bld: 71 mg/dL (ref 70–99)
Potassium: 4.5 mEq/L (ref 3.5–5.3)
Sodium: 139 mEq/L (ref 135–145)

## 2014-10-29 LAB — ETHANOL

## 2014-10-29 LAB — SALICYLATE LEVEL: Salicylate Lvl: <4 mg/dL (ref 2.8–30.0)

## 2014-10-29 LAB — ACETAMINOPHEN LEVEL: Acetaminophen (Tylenol), Serum: <10 ug/mL — ABNORMAL LOW (ref 10–30)

## 2014-10-29 MED ORDER — ALBUTEROL SULFATE HFA 108 (90 BASE) MCG/ACT IN AERS: 2.0000 | INHALATION_SPRAY | RESPIRATORY_TRACT | Status: DC | PRN

## 2014-10-29 MED ORDER — RITONAVIR 100 MG PO TABS
100.0000 mg | ORAL_TABLET | Freq: Every day | ORAL | Status: DC
  Administered 2014-10-30: 100 mg via ORAL
  Filled 2014-10-29 (×2): qty 1

## 2014-10-29 MED ORDER — BECLOMETHASONE DIPROPIONATE 80 MCG/ACT IN AERS: 1.0000 | INHALATION_SPRAY | Freq: Two times a day (BID) | RESPIRATORY_TRACT | Status: DC

## 2014-10-29 MED ORDER — EMTRICITABINE-TENOFOVIR DF 200-300 MG PO TABS
1.0000 | ORAL_TABLET | Freq: Every day | ORAL | Status: DC
  Administered 2014-10-30: 1 via ORAL
  Filled 2014-10-29: qty 1

## 2014-10-29 MED ORDER — TRAZODONE HCL 100 MG PO TABS: 100.0000 mg | ORAL_TABLET | Freq: Every day | ORAL | Status: DC

## 2014-10-29 MED ORDER — RALTEGRAVIR POTASSIUM 400 MG PO TABS
400.0000 mg | ORAL_TABLET | Freq: Two times a day (BID) | ORAL | Status: DC
  Administered 2014-10-30 (×2): 400 mg via ORAL
  Filled 2014-10-29 (×3): qty 1

## 2014-10-29 MED ORDER — ZOLPIDEM TARTRATE 10 MG PO TABS
10.0000 mg | ORAL_TABLET | Freq: Every day | ORAL | Status: DC
  Administered 2014-10-30: 10 mg via ORAL
  Filled 2014-10-29: qty 1

## 2014-10-29 MED ORDER — GABAPENTIN 400 MG PO CAPS
400.0000 mg | ORAL_CAPSULE | Freq: Two times a day (BID) | ORAL | Status: DC
Start: 2014-10-29 — End: 2014-10-30
  Administered 2014-10-30 (×2): 400 mg via ORAL
  Filled 2014-10-29 (×2): qty 1

## 2014-10-29 MED ORDER — BENZTROPINE MESYLATE 1 MG PO TABS
0.5000 mg | ORAL_TABLET | Freq: Two times a day (BID) | ORAL | Status: DC
  Administered 2014-10-30 (×2): 0.5 mg via ORAL
  Filled 2014-10-29 (×2): qty 1

## 2014-10-29 MED ORDER — CLONAZEPAM 1 MG PO TABS
1.0000 mg | ORAL_TABLET | Freq: Two times a day (BID) | ORAL | Status: DC | PRN
Start: 2014-10-29 — End: 2014-10-30

## 2014-10-29 MED ORDER — QUETIAPINE FUMARATE 50 MG PO TABS
50.0000 mg | ORAL_TABLET | Freq: Three times a day (TID) | ORAL | Status: DC
  Administered 2014-10-30 (×2): 50 mg via ORAL
  Filled 2014-10-29 (×2): qty 1

## 2014-10-29 MED ORDER — HYDROXYZINE HCL 25 MG PO TABS
50.0000 mg | ORAL_TABLET | Freq: Three times a day (TID) | ORAL | Status: DC | PRN
  Administered 2014-10-29: 50 mg via ORAL
  Filled 2014-10-29: qty 2

## 2014-10-29 MED ORDER — DARUNAVIR ETHANOLATE 800 MG PO TABS
800.0000 mg | ORAL_TABLET | Freq: Every day | ORAL | Status: DC
  Administered 2014-10-30: 800 mg via ORAL
  Filled 2014-10-29 (×2): qty 1

## 2014-10-29 MED ORDER — HALOPERIDOL 5 MG PO TABS
5.0000 mg | ORAL_TABLET | Freq: Two times a day (BID) | ORAL | Status: DC
  Administered 2014-10-30 (×2): 5 mg via ORAL
  Filled 2014-10-29 (×2): qty 1

## 2014-10-29 MED ORDER — BUDESONIDE 0.25 MG/2ML IN SUSP
0.2500 mg | Freq: Two times a day (BID) | RESPIRATORY_TRACT | Status: DC
  Filled 2014-10-29 (×4): qty 2

## 2014-10-29 MED ORDER — FAMOTIDINE 20 MG PO TABS
20.0000 mg | ORAL_TABLET | Freq: Two times a day (BID) | ORAL | Status: DC
  Administered 2014-10-30 (×2): 20 mg via ORAL
  Filled 2014-10-29 (×2): qty 1

## 2014-10-29 NOTE — ED Notes (Signed)
Bed: [facility] Expected date:  Expected time:  Means of arrival:  Comments: ems 

## 2014-10-29 NOTE — ED Notes (Signed)
Per EMS, pt from home, pt's psychiatrist and husband, pt has been "talking out of her head" per EMS.  Was sent here for psychiatric eval.

## 2014-10-29 NOTE — ED Notes (Signed)
[name] (brother) home # [phone] and cell [phone]
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
## 2014-10-29 NOTE — Telephone Encounter (Signed)
Pt here today for BMET. Her sister, Annamarie Major (phone [phone]) accompanied her and brought updated medication list.  Psychiatric meds patient taking include: Zolpidem  QHS Clonazepam  BID prn agitation Quetiapine  BID Gabapentin  BID  Additional HIV meds: Truvada 200-300mg  daily Prezista  QD with breakfast (take with Norvir) Isentress  BID Norvir  PO QD  Patient signed form authorizing sister Annamarie Major to access her medical record. Ms. Jethro Poling stated she was involved with pt's ACT team and that psychiatrist Dr. Clyde Canterbury would be coming to pt's house today to evaluate her. In my judgment, Ms. Norwood does appear to have patient's best interest in mind.  (note that by time of my entering this note, it appears that patient is in ED at [facility] for psychiatric evaluation. I will not update medication list in Epic so as to not interfere with medication reconciliation process at the hospital).  [name], MD

## 2014-10-29 NOTE — Progress Notes (Signed)
Bmp done today Sloan Galentine 

## 2014-10-29 NOTE — ED Notes (Signed)
Pt reports she was sent to the ED because she made her husband mad by wanting to pray for her dying niece Tiffany.  As the Clydie Braun phlebotomist walked in to collect blood, pt asked her if she wanted cocktail peanuts.  Pt is calm and cooperative at this time.    She also told this nurse that she is very happy because this nurse is pregnant, and states "and you eat chinese food and rice too, don't you?"
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
## 2014-10-29 NOTE — BH Assessment (Signed)
Assessment completed. Consulted Hulan Fess, NP who agrees that pt meets inpatient criteria. Dr. Clarene Duke has been informed of the recommendation.

## 2014-10-29 NOTE — ED Notes (Addendum)
Patient admits to [facility] with a plan to overdose on pills and VH hallucinations stating "I see mercury". Patient is restless at this time. Encouragement and support provided and safety maintain. Q 15 min safety maintain.

## 2014-10-30 ENCOUNTER — Encounter (HOSPITAL_COMMUNITY): Payer: Self-pay | Admitting: *Deleted

## 2014-10-30 ENCOUNTER — Telehealth: Payer: Self-pay | Admitting: Family Medicine

## 2014-10-30 ENCOUNTER — Encounter: Payer: Self-pay | Admitting: Family Medicine

## 2014-10-30 ENCOUNTER — Inpatient Hospital Stay (HOSPITAL_COMMUNITY)
Admission: AD | Admit: 2014-10-30 | Discharge: 2014-11-06 | DRG: 885 | Disposition: A | Discharge status: Home / Self Care | Payer: Medicaid Other | Source: Intra-hospital | Attending: Psychiatry | Admitting: Psychiatry

## 2014-10-30 DIAGNOSIS — F200 Paranoid schizophrenia: Secondary | ICD-10-CM | POA: Diagnosis present

## 2014-10-30 DIAGNOSIS — J45909 Unspecified asthma, uncomplicated: Secondary | ICD-10-CM | POA: Insufficient documentation

## 2014-10-30 DIAGNOSIS — J4530 Mild persistent asthma, uncomplicated: Secondary | ICD-10-CM

## 2014-10-30 DIAGNOSIS — J449 Chronic obstructive pulmonary disease, unspecified: Secondary | ICD-10-CM | POA: Insufficient documentation

## 2014-10-30 DIAGNOSIS — Z79899 Other long term (current) drug therapy: Secondary | ICD-10-CM | POA: Diagnosis not present

## 2014-10-30 DIAGNOSIS — F17210 Nicotine dependence, cigarettes, uncomplicated: Secondary | ICD-10-CM | POA: Diagnosis present

## 2014-10-30 DIAGNOSIS — F29 Unspecified psychosis not due to a substance or known physiological condition: Secondary | ICD-10-CM | POA: Insufficient documentation

## 2014-10-30 DIAGNOSIS — F209 Schizophrenia, unspecified: Secondary | ICD-10-CM | POA: Diagnosis present

## 2014-10-30 DIAGNOSIS — R45851 Suicidal ideations: Secondary | ICD-10-CM | POA: Diagnosis present

## 2014-10-30 DIAGNOSIS — B20 Human immunodeficiency virus [HIV] disease: Secondary | ICD-10-CM | POA: Diagnosis present

## 2014-10-30 DIAGNOSIS — K219 Gastro-esophageal reflux disease without esophagitis: Secondary | ICD-10-CM | POA: Diagnosis present

## 2014-10-30 MED ORDER — RALTEGRAVIR POTASSIUM 400 MG PO TABS
400.0000 mg | ORAL_TABLET | Freq: Two times a day (BID) | ORAL | Status: DC
  Administered 2014-10-30 – 2014-11-06 (×14): 400 mg via ORAL
  Filled 2014-10-30 (×19): qty 1

## 2014-10-30 MED ORDER — GABAPENTIN 400 MG PO CAPS
400.0000 mg | ORAL_CAPSULE | Freq: Two times a day (BID) | ORAL | Status: DC
  Administered 2014-10-30 – 2014-10-31 (×2): 400 mg via ORAL
  Filled 2014-10-30 (×5): qty 1

## 2014-10-30 MED ORDER — RITONAVIR 100 MG PO TABS
100.0000 mg | ORAL_TABLET | Freq: Every day | ORAL | Status: DC
  Administered 2014-10-31 – 2014-11-06 (×7): 100 mg via ORAL
  Filled 2014-10-30 (×9): qty 1

## 2014-10-30 MED ORDER — DARUNAVIR ETHANOLATE 800 MG PO TABS
800.0000 mg | ORAL_TABLET | Freq: Every day | ORAL | Status: DC
Start: 2014-10-31 — End: 2014-11-06
  Administered 2014-10-31 – 2014-11-06 (×7): 800 mg via ORAL
  Filled 2014-10-30 (×9): qty 1

## 2014-10-30 MED ORDER — TRAZODONE HCL 100 MG PO TABS
100.0000 mg | ORAL_TABLET | Freq: Every day | ORAL | Status: DC
  Administered 2014-10-30: 100 mg via ORAL
  Filled 2014-10-30 (×3): qty 1

## 2014-10-30 MED ORDER — LORAZEPAM 1 MG PO TABS
1.0000 mg | ORAL_TABLET | Freq: Once | ORAL | Status: AC
  Administered 2014-10-30: 1 mg via ORAL
  Filled 2014-10-30: qty 1

## 2014-10-30 MED ORDER — BECLOMETHASONE DIPROPIONATE 80 MCG/ACT IN AERS
1.0000 | INHALATION_SPRAY | Freq: Two times a day (BID) | RESPIRATORY_TRACT | Status: DC
  Administered 2014-10-31 – 2014-11-06 (×13): 1 via RESPIRATORY_TRACT
  Filled 2014-10-30 (×2): qty 8.7

## 2014-10-30 MED ORDER — HYDROXYZINE HCL 50 MG PO TABS
50.0000 mg | ORAL_TABLET | Freq: Three times a day (TID) | ORAL | Status: DC | PRN
  Administered 2014-10-31: 50 mg via ORAL
  Filled 2014-10-30: qty 1

## 2014-10-30 MED ORDER — QUETIAPINE FUMARATE 50 MG PO TABS
50.0000 mg | ORAL_TABLET | Freq: Three times a day (TID) | ORAL | Status: DC
  Administered 2014-10-30 – 2014-10-31 (×2): 50 mg via ORAL
  Filled 2014-10-30 (×6): qty 1

## 2014-10-30 MED ORDER — BENZTROPINE MESYLATE 0.5 MG PO TABS
0.5000 mg | ORAL_TABLET | Freq: Two times a day (BID) | ORAL | Status: DC
  Administered 2014-10-30 – 2014-11-06 (×14): 0.5 mg via ORAL
  Filled 2014-10-30: qty 6
  Filled 2014-10-30 (×14): qty 1
  Filled 2014-10-30: qty 6
  Filled 2014-10-30 (×4): qty 1

## 2014-10-30 MED ORDER — CLONAZEPAM 1 MG PO TABS
1.0000 mg | ORAL_TABLET | Freq: Two times a day (BID) | ORAL | Status: DC | PRN
  Administered 2014-10-31: 1 mg via ORAL
  Filled 2014-10-30: qty 1

## 2014-10-30 MED ORDER — ALBUTEROL SULFATE HFA 108 (90 BASE) MCG/ACT IN AERS
2.0000 | INHALATION_SPRAY | RESPIRATORY_TRACT | Status: DC | PRN
  Administered 2014-10-31 – 2014-11-01 (×3): 2 via RESPIRATORY_TRACT
  Filled 2014-10-30 (×2): qty 6.7

## 2014-10-30 MED ORDER — ZOLPIDEM TARTRATE 5 MG PO TABS
5.0000 mg | ORAL_TABLET | Freq: Every day | ORAL | Status: DC
  Administered 2014-10-30: 5 mg via ORAL
  Filled 2014-10-30: qty 1

## 2014-10-30 MED ORDER — OLANZAPINE 5 MG PO TBDP
5.0000 mg | ORAL_TABLET | Freq: Four times a day (QID) | ORAL | Status: DC | PRN
  Administered 2014-10-31 – 2014-11-03 (×3): 5 mg via ORAL
  Filled 2014-10-30 (×3): qty 1

## 2014-10-30 MED ORDER — FAMOTIDINE 20 MG PO TABS
20.0000 mg | ORAL_TABLET | Freq: Two times a day (BID) | ORAL | Status: DC
  Administered 2014-10-30 – 2014-11-06 (×14): 20 mg via ORAL
  Filled 2014-10-30 (×19): qty 1

## 2014-10-30 MED ORDER — FLUTICASONE PROPIONATE HFA 44 MCG/ACT IN AERO
2.0000 | INHALATION_SPRAY | Freq: Two times a day (BID) | RESPIRATORY_TRACT | Status: DC
  Administered 2014-10-30: 2 via RESPIRATORY_TRACT
  Filled 2014-10-30: qty 10.6

## 2014-10-30 MED ORDER — OLANZAPINE 5 MG PO TBDP
ORAL_TABLET | ORAL | Status: AC
  Administered 2014-10-30: 5 mg
  Filled 2014-10-30: qty 1

## 2014-10-30 MED ORDER — FLUTICASONE PROPIONATE HFA 44 MCG/ACT IN AERO: 2.0000 | INHALATION_SPRAY | Freq: Two times a day (BID) | RESPIRATORY_TRACT | Status: DC

## 2014-10-30 MED ORDER — EMTRICITABINE-TENOFOVIR DF 200-300 MG PO TABS
1.0000 | ORAL_TABLET | Freq: Every day | ORAL | Status: DC
  Administered 2014-10-31 – 2014-11-06 (×7): 1 via ORAL
  Filled 2014-10-30 (×9): qty 1

## 2014-10-30 NOTE — ED Notes (Signed)
Speech is garbled and pt is confused and disoriented.  Compliant with medications.  Redirected when necessary.

## 2014-10-30 NOTE — Progress Notes (Signed)
Patient ID: [name], female   DOB: 02-Feb-1961, 54 y.o.   MRN: [id number] Pt admitted voluntarily with extreme confusion.  Pt is very disoriented with "garbled speech."  Pt has a hx of Schizophrenia.  She has a medical hx of HIV, Hep B, Seizures, GERD, COPD, Asthma, Bronchitis.  She has family support with 3 siblings listed in her chart (permission to share information.)  Obtaining information from pt was very difficulty as pt was too confused to answer any questions.  Pt is a 1:1 for safety reasons. Pt has no known allergies.  She is on multiple medications.

## 2014-10-30 NOTE — Telephone Encounter (Signed)
Noted. Thanks! Romain Erion J Brocha Gilliam, MD  

## 2014-10-30 NOTE — Telephone Encounter (Signed)
Brother called to say that patient was admitted to [facility].  Was admitted for psychotic episodes.
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
## 2014-10-30 NOTE — ED Notes (Signed)
Report to Shelda Jakes RN.  To transport at 1430.
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
## 2014-10-30 NOTE — Progress Notes (Addendum)
Pt appears very confused and keeps trying to go into other pts rooms. Pt has to be redirected constantly. She is very quiet and steady on her feet. At this time pt doe not appear to be responding to internal stimuli. She remains a 1:1 for safety. Pt at times has garbled speech and is unable to tell the writer what year it is or where she is. Presently the pt is in the dayroom. 6pm-Pt remains a 1;1 and presently is asleep on her side. Respirations are regular and nonlabored-7p-pt is very loud and singing songs. She is making advances on the staff . Pt was encouraged to listen to soft music. Pt is alert to person only. She stated," Earl Lites is the president. " pt stated the year was 2017. She was reoriented. Pt was given zyprexa for extreme agitation .

## 2014-10-30 NOTE — Progress Notes (Signed)
1:1 NOTE  Pt at this time continues to exhibit impulsive behaviors, lacks understanding of her physical and cognitive limitations. Pt at this time in bed. 1: 1 staff present. 1:1 monitoring continues for patient's safety.

## 2014-10-30 NOTE — BH Assessment (Signed)
BHH Assessment Progress Note   Per Thedore Mins, MD, this pt requires psychiatric hospitalization at this time.  Berneice Heinrich, RN, [facility] has assigned pt to [facility] Rm 502-2.  Pt has signed Voluntary Admission and Consent for Treatment and signed form has been faxed to [facility].  Pt's nurse, Carlisle Beers,  has been notified, and agrees to send original paperwork along with pt via Juel Burrow, and to call report to [phone].  Doylene Canning, MA Triage Specialist [phone]

## 2014-10-30 NOTE — Consult Note (Signed)
[place] [facility] Consult   Reason for Consult:  Schizophrenia, undifferentiated  type, Psychosis Referring Physician: EDP Patient Identification: [name] MRN:  [id number] Principal Diagnosis: Schizophrenia Diagnosis:   Patient Active Problem List   Diagnosis Date Noted  . Schizophrenia [F20.9] 08/18/2010    Priority: High  . Hyponatremia [E87.1] 10/22/2014  . Microscopic hematuria [R31.2] 01/29/2014  . Abdominal pain [R10.9] 12/12/2013  . Vulvar irritation [N90.89] 12/12/2013  . COPD (chronic obstructive pulmonary disease) [J44.9] 02/18/2013  . Tobacco abuse [Z72.0] 02/18/2013  . Hepatitis B infection [B16.9]   . Pre-ulcerative corn or callous [L84] 05/07/2012  . Urinary incontinence, nocturnal enuresis [N39.44] 05/07/2012  . Fecal incontinence [R15.9] 05/07/2012  . Systolic murmur [K46.2] 86/38/1771  . Routine health maintenance [Z00.00] 05/07/2012  . Asthma [493] 08/18/2010  . HIV (human immunodeficiency virus infection) [Z21] 08/18/2010  . Herpes simplex infection [B00.9] 08/18/2010    Total Time spent with patient: 1 hour  Subjective:   [name] is a 54 y.o. female patient admitted with Undifferenciated .Schizophrenia, Psychosis  HPI:  AA female, 54 years old was seen this morning for evaluation of inappropriate behaviors like leaving the stove on after cooking and giving inappropriate answers to questions.  Patient this morning could not answer questions appropriately.  She reported that she was brought in by her sister after her coffee was poisoned with "Rat"   Patient was smiling inappropriately, she did not know the name of the hospital and what brought her in.  She admitted to a previous inpatient hospitalization in [place] but did not know the reason for the admission.  Patient reports that she lives at the "[facility]"  It is documented that she was having visual hallucination at home.  She remain calm and cooperative but disorganized.  She has been  accepted for admission and she has abed assigned to her.  HPI Elements:   Location:  Undifferenciated Schizophrenia, Psychosis. Quality:  severe. Severity:  severe. Timing:  Acute. Duration:  Chronic mental illness. Context:  Brought in by family for [place] evaluation..  Past Medical History:  Past Medical History  Diagnosis Date  . GERD (gastroesophageal reflux disease)   . Schizophrenia   . AIDS 12-10-2007  . Herpes simplex   . History of cholecystectomy   . Cataract   . Nonspecific reaction to tuberculin skin test without active tuberculosis 11-2008 [facility]   . Seizures   . Asthmatic bronchitis , chronic   . Hepatitis B infection     followed by ID  . Chronic hyponatremia     Past Surgical History  Procedure Laterality Date  . Cholecystectomy    . Leg surgery      right leg, s/p accident   Family History:  Family History  Problem Relation Age of Onset  . Diabetes Sister   . Cancer Sister     breast  . Diabetes Brother   . Hypertension Brother   . Cancer Mother     lung  . Hypertension Mother   . Heart disease Father    Social History:  History  Alcohol Use No     History  Drug Use No    History   Social History  . Marital Status: Single    Spouse Name: N/A  . Number of Children: N/A  . Years of Education: N/A   Social History Main Topics  . Smoking status: Current Every Day Smoker -- 2.00 packs/day for 35 years    Types: Cigarettes  Start date: 04/10/1974  . Smokeless tobacco: Never Used     Comment: Previously smoked 2ppd, might start patch  . Alcohol Use: No  . Drug Use: No  . Sexual Activity: Not Currently     Comment: pt. declined condoms   Other Topics Concern  . None   Social History Narrative   Additional Social History:    History of alcohol / drug use?: No history of alcohol / drug abuse                     Allergies:  No Known Allergies  Labs:  Results for orders placed or performed during the hospital encounter of  10/29/14 (from the past 48 hour(s))  Comprehensive metabolic panel     Status: None   Collection Time: 10/29/14  5:10 PM  Result Value Ref Range   Sodium 137 135 - 145 mmol/L   Potassium 3.9 3.5 - 5.1 mmol/L   Chloride 107 101 - 111 mmol/L   CO2 23 22 - 32 mmol/L   Glucose, Bld 88 65 - 99 mg/dL   BUN 20 6 - 20 mg/dL   Creatinine, Ser 0.99 0.44 - 1.00 mg/dL   Calcium 9.1 8.9 - 10.3 mg/dL   Total Protein 7.6 6.5 - 8.1 g/dL   Albumin 4.0 3.5 - 5.0 g/dL   AST 22 15 - 41 U/L   ALT 18 14 - 54 U/L   Alkaline Phosphatase 108 38 - 126 U/L   Total Bilirubin 0.5 0.3 - 1.2 mg/dL   GFR calc non Af Amer >60 >60 mL/min   GFR calc Af Amer >60 >60 mL/min    Comment: (NOTE) The eGFR has been calculated using the CKD EPI equation. This calculation has not been validated in all clinical situations. eGFR's persistently <60 mL/min signify possible Chronic Kidney Disease.    Anion gap 7 5 - 15  Ethanol (ETOH)     Status: None   Collection Time: 10/29/14  5:10 PM  Result Value Ref Range   Alcohol, Ethyl (B) <5 <5 mg/dL    Comment:        LOWEST DETECTABLE LIMIT FOR SERUM ALCOHOL IS 5 mg/dL FOR MEDICAL PURPOSES ONLY   Salicylate level     Status: None   Collection Time: 10/29/14  5:10 PM  Result Value Ref Range   Salicylate Lvl <3.9 2.8 - 30.0 mg/dL  Acetaminophen level     Status: Abnormal   Collection Time: 10/29/14  5:10 PM  Result Value Ref Range   Acetaminophen (Tylenol), Serum <10 (L) 10 - 30 ug/mL    Comment:        THERAPEUTIC CONCENTRATIONS VARY SIGNIFICANTLY. A RANGE OF 10-30 ug/mL MAY BE AN EFFECTIVE CONCENTRATION FOR MANY PATIENTS. HOWEVER, SOME ARE BEST TREATED AT CONCENTRATIONS OUTSIDE THIS RANGE. ACETAMINOPHEN CONCENTRATIONS >150 ug/mL AT 4 HOURS AFTER INGESTION AND >50 ug/mL AT 12 HOURS AFTER INGESTION ARE OFTEN ASSOCIATED WITH TOXIC REACTIONS.   CBC     Status: Abnormal   Collection Time: 10/29/14  5:10 PM  Result Value Ref Range   WBC 6.2 4.0 - 10.5 K/uL   RBC  4.81 3.87 - 5.11 MIL/uL   Hemoglobin 15.1 (H) 12.0 - 15.0 g/dL   HCT 44.1 36.0 - 46.0 %   MCV 91.7 78.0 - 100.0 fL   MCH 31.4 26.0 - 34.0 pg   MCHC 34.2 30.0 - 36.0 g/dL   RDW 13.9 11.5 - 15.5 %   Platelets 246 150 -  400 K/uL  Urine rapid drug screen (hosp performed) (Not at [facility])     Status: None   Collection Time: 10/29/14  7:03 PM  Result Value Ref Range   Opiates NONE DETECTED NONE DETECTED   Cocaine NONE DETECTED NONE DETECTED   Benzodiazepines NONE DETECTED NONE DETECTED   Amphetamines NONE DETECTED NONE DETECTED   Tetrahydrocannabinol NONE DETECTED NONE DETECTED   Barbiturates NONE DETECTED NONE DETECTED    Comment:        DRUG SCREEN FOR MEDICAL PURPOSES ONLY.  IF CONFIRMATION IS NEEDED FOR ANY PURPOSE, NOTIFY LAB WITHIN 5 DAYS.        LOWEST DETECTABLE LIMITS FOR URINE DRUG SCREEN Drug Class       Cutoff (ng/mL) Amphetamine      1000 Barbiturate      200 Benzodiazepine   694 Tricyclics       503 Opiates          300 Cocaine          300 THC              50     Vitals: Blood pressure 120/67, pulse 65, temperature 97.4 F (36.3 C), temperature source Oral, resp. rate 18, SpO2 100 %.  Risk to Self: Suicidal Ideation: Yes-Currently Present Suicidal Intent: Yes-Currently Present Is patient at risk for suicide?: Yes Suicidal Plan?: Yes-Currently Present Specify Current Suicidal Plan: "with pills" Access to Means: Yes Specify Access to Suicidal Means: Pt has prescriptions  What has been your use of drugs/alcohol within the last 12 months?: No drug or alcohol use reported.  How many times?: 1 Other Self Harm Risks: No other self harm risk identified at this time.  Triggers for Past Attempts: None known Intentional Self Injurious Behavior: None Risk to Others: Homicidal Ideation: No Thoughts of Harm to Others: No Current Homicidal Intent: No Current Homicidal Plan: No Access to Homicidal Means: No Identified Victim: N/A History of harm to others?:  No Assessment of Violence: On admission Violent Behavior Description: No violent behaviors observed. Pt is calm and cooperative at this time.  Does patient have access to weapons?: No Criminal Charges Pending?: No Does patient have a court date: No Prior Inpatient Therapy: Prior Inpatient Therapy: Yes [facility]) Prior Therapy Dates: 10-10-2014; 2004; 2010 Prior Therapy Facilty/Provider(s): [facility]; [facility]  Reason for Treatment: Aggressive behavior Prior Outpatient Therapy: Prior Outpatient Therapy: Yes Prior Therapy Dates: Current Prior Therapy Facilty/Provider(s): [facility] ([facility]) Reason for Treatment: Schizophrenia Does patient have an ACCT team?: Yes Does patient have Intensive In-House Services?  : No Does patient have [facility] services? : No Does patient have [facility] services?: No  Current Facility-Administered Medications  Medication Dose Route Frequency Provider Last Rate Last Dose  . albuterol (PROVENTIL HFA;VENTOLIN HFA) 108 (90 BASE) MCG/ACT inhaler 2 puff  2 puff Inhalation Q4H PRN Francine Graven, DO      . benztropine (COGENTIN) tablet 0.5 mg  0.5 mg Oral BID Francine Graven, DO   0.5 mg at 10/30/14 1046  . clonazePAM (KLONOPIN) tablet 1 mg  1 mg Oral BID PRN Francine Graven, DO      . Darunavir Ethanolate (PREZISTA) tablet 800 mg  800 mg Oral Q breakfast Francine Graven, DO   800 mg at 10/30/14 8882  . emtricitabine-tenofovir (TRUVADA) 200-300 MG per tablet 1 tablet  1 tablet Oral Daily Francine Graven, DO   1 tablet at 10/30/14 1046  . famotidine (PEPCID) tablet 20 mg  20 mg Oral BID Francine Graven, DO  20 mg at 10/30/14 1047  . fluticasone (FLOVENT HFA) 44 MCG/ACT inhaler 2 puff  2 puff Inhalation BID Alphonzo Severance, RPH   2 puff at 10/30/14 6010  . gabapentin (NEURONTIN) capsule 400 mg  400 mg Oral BID Francine Graven, DO   400 mg at 10/30/14 1047  . hydrOXYzine (ATARAX/VISTARIL) tablet 50 mg  50 mg Oral TID PRN Harriet Butte, NP   50 mg at 10/29/14  2237  . QUEtiapine (SEROQUEL) tablet 50 mg  50 mg Oral TID Louay Myrie   50 mg at 10/30/14 1047  . raltegravir (ISENTRESS) tablet 400 mg  400 mg Oral BID Francine Graven, DO   400 mg at 10/30/14 1046  . ritonavir (NORVIR) tablet 100 mg  100 mg Oral Q breakfast Francine Graven, DO   100 mg at 10/30/14 9323  . traZODone (DESYREL) tablet 100 mg  100 mg Oral QHS Francine Graven, DO   100 mg at 10/30/14 0120  . zolpidem (AMBIEN) tablet 10 mg  10 mg Oral QHS Francine Graven, DO   10 mg at 10/30/14 0004   Current Outpatient Prescriptions  Medication Sig Dispense Refill  . benztropine (COGENTIN) 0.5 MG tablet Take 0.5 mg by mouth 2 (two) times daily.    . benztropine mesylate (COGENTIN) 1 MG/ML injection Inject into the muscle once a week.    . clonazePAM (KLONOPIN) 1 MG tablet Take 1 mg by mouth 2 (two) times daily as needed (for agitation).    . Darunavir Ethanolate (PREZISTA) 800 MG tablet TAKE 1 TABLET BY MOUTH ONCE DAILY WITH BREAKFAST.  TAKE WITH NORVIR. (Patient taking differently: Take 800 mg by mouth daily with breakfast. TAKE 1 TABLET BY MOUTH ONCE DAILY WITH BREAKFAST.  TAKE WITH NORVIR.) 30 tablet 5  . emtricitabine-tenofovir (TRUVADA) 200-300 MG per tablet Take 1 tablet by mouth daily. 30 tablet 5  . gabapentin (NEURONTIN) 400 MG capsule Take 400 mg by mouth 2 (two) times daily.    . haloperidol (HALDOL) 5 MG tablet Take 5 mg by mouth 2 (two) times daily.    . QUEtiapine (SEROQUEL) 50 MG tablet Take 50 mg by mouth 3 (three) times daily.     . raltegravir (ISENTRESS) 400 MG tablet TAKE ONE TABLET   BY MOUTH   TWICE A DAY (Patient taking differently: Take 400 mg by mouth 2 (two) times daily. ) 60 tablet 5  . ritonavir (NORVIR) 100 MG TABS tablet TAKE 1 TABLET BY MOUTH ONCE DAILY WITH PREZISTA (Patient taking differently: Take 100 mg by mouth daily with breakfast. Take WITH PREZISTA) 30 tablet 5  . traZODone (DESYREL) 100 MG tablet Take 100 mg by mouth at bedtime.    Marland Kitchen zolpidem (AMBIEN)  10 MG tablet Take 10 mg by mouth at bedtime.    Marland Kitchen albuterol (PROAIR HFA) 108 (90 BASE) MCG/ACT inhaler INHALE TWO   PUFFS INTO LUNGS EVERY 6 HOURS AS NEEDED FOR SHORTNESS OF BREATH 1 Inhaler 5  . albuterol (PROVENTIL) (2.5 MG/3ML) 0.083% nebulizer solution INHALE CONTENTS OF 1 VIAL USING NEBULIZER EVERY 6 HOURS AS NEEDED FOR WHEEZING 75 mL 5  . beclomethasone (QVAR) 80 MCG/ACT inhaler Inhale 1 puff into the lungs 2 (two) times daily. 1 Inhaler 12  . guaiFENesin-dextromethorphan (ROBITUSSIN DM) 100-10 MG/5ML syrup Take 5 mLs by mouth every 4 (four) hours as needed for cough. (Patient not taking: Reported on 07/14/2014) 118 mL 0  . nystatin (MYCOSTATIN/NYSTOP) 100000 UNIT/GM POWD Apply topically 3 times daily to outside of vulvar area (Patient  not taking: Reported on 07/14/2014) 30 g 0  . polyethylene glycol powder (GLYCOLAX/MIRALAX) powder Take 17 g by mouth daily. 500 g 1  . ranitidine (ZANTAC) 150 MG tablet Take 1 tablet (150 mg total) by mouth 2 (two) times daily. 60 tablet 5    Musculoskeletal: Strength & Muscle Tone: within normal limits Gait & Station: normal Patient leans: N/A  Psychiatric Specialty Exam: Physical Exam  HENT:  Mouth/Throat: Oropharynx is clear and moist.  SEE EXTENSIVE PMH  Review of Systems  Constitutional: Negative.   HENT: Negative.   Eyes: Negative.   Respiratory: Negative.   Cardiovascular: Negative.   Gastrointestinal: Negative.   Genitourinary: Negative.   Musculoskeletal: Negative.   Skin: Negative.   Neurological: Negative.   Endo/Heme/Allergies: Negative.     Blood pressure 120/67, pulse 65, temperature 97.4 F (36.3 C), temperature source Oral, resp. rate 18, SpO2 100 %.There is no weight on file to calculate BMI.  General Appearance: Casual  Eye Contact::  Good  Speech:  Normal Rate  Volume:  Normal  Mood:  unable to determine, patient is very disorganized  Affect:  Congruent, Full Range and disorganized  Thought Process:  Disorganized, Loose  and Tangential  Orientation:  Other:  oriented to self  Thought Content:  Delusions  Suicidal Thoughts:  No  Homicidal Thoughts:  No  Memory:  Immediate;   Poor Recent;   Poor Remote;   Poor  Judgement:  Poor  Insight:  Shallow  Psychomotor Activity:  Normal  Concentration:  Poor  Recall:  Poor  Fund of Knowledge:Poor  Language: Poor  Akathisia:  NA  Handed:  Right  AIMS (if indicated):     Assets:  Desire for Improvement  ADL's:  Intact  Cognition: Impaired,  Moderate  Sleep:      Medical Decision Making: Review of Psycho-Social Stressors (1), Established Problem, Worsening (2), Review of Medication Regimen & Side Effects (2) and Review of New Medication or Change in Dosage (2)  Treatment Plan Summary: Daily contact with patient to assess and evaluate symptoms and progress in treatment and Medication management  Plan:  Recommend psychiatric Inpatient admission when medically cleared. accepted at [facility] Disposition: Admitted at [facility]  Delfin Gant   PMHNP-BC 10/30/2014 12:56 PM Patient seen face-to-face for psychiatric evaluation, chart reviewed and case discussed with the physician extender and developed treatment plan. Reviewed the information documented and agree with the treatment plan. Corena Pilgrim, MD

## 2014-10-31 DIAGNOSIS — R45851 Suicidal ideations: Secondary | ICD-10-CM

## 2014-10-31 MED ORDER — TRAZODONE HCL 50 MG PO TABS
50.0000 mg | ORAL_TABLET | Freq: Every day | ORAL | Status: DC
  Administered 2014-10-31 – 2014-11-05 (×6): 50 mg via ORAL
  Filled 2014-10-31 (×5): qty 1
  Filled 2014-10-31: qty 3
  Filled 2014-10-31 (×4): qty 1

## 2014-10-31 MED ORDER — GABAPENTIN 300 MG PO CAPS
300.0000 mg | ORAL_CAPSULE | Freq: Two times a day (BID) | ORAL | Status: DC
  Administered 2014-10-31 – 2014-11-06 (×12): 300 mg via ORAL
  Filled 2014-10-31 (×5): qty 1
  Filled 2014-10-31: qty 6
  Filled 2014-10-31 (×4): qty 1
  Filled 2014-10-31: qty 6
  Filled 2014-10-31 (×5): qty 1

## 2014-10-31 MED ORDER — HYDROXYZINE HCL 25 MG PO TABS
25.0000 mg | ORAL_TABLET | Freq: Two times a day (BID) | ORAL | Status: DC | PRN
  Administered 2014-10-31 – 2014-11-05 (×6): 25 mg via ORAL
  Filled 2014-10-31 (×2): qty 1
  Filled 2014-10-31: qty 10
  Filled 2014-10-31 (×5): qty 1

## 2014-10-31 MED ORDER — LORAZEPAM 1 MG PO TABS
1.0000 mg | ORAL_TABLET | Freq: Two times a day (BID) | ORAL | Status: DC
  Administered 2014-10-31 – 2014-11-06 (×12): 1 mg via ORAL
  Filled 2014-10-31 (×12): qty 1

## 2014-10-31 MED ORDER — HALOPERIDOL 5 MG PO TABS
5.0000 mg | ORAL_TABLET | Freq: Two times a day (BID) | ORAL | Status: DC
  Administered 2014-10-31 – 2014-11-05 (×10): 5 mg via ORAL
  Filled 2014-10-31 (×14): qty 1

## 2014-10-31 NOTE — Progress Notes (Signed)
1:1 NOTE  Pt at this time in appear to be sleeping quietly in bed without no respiratory distress noted. 1:1 staff at this time is actively observing pt. 15-minute safety checks also continues. 
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
## 2014-10-31 NOTE — Progress Notes (Signed)
Patient ID: [name], female   DOB: [birth year], 54 y.o.   MRN: [id number]   D: Pt in bed resting, no physical distress noted. Pt will respond to the calling of her name, no issues noted.  A: 1:1 continued for patient safety. R: Pt safety maintained.
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
## 2014-10-31 NOTE — Progress Notes (Signed)
1:1 NOTE  Pt at this time in appear to be sleeping quietly in bed without no respiratory distress noted. 1:1 staff at this time is actively observing pt. 15-minute safety checks also continues.

## 2014-10-31 NOTE — [facility] Group Notes (Signed)
[facility] Group Notes: (Clinical Social Work)   10/31/2014      Type of Therapy:  Group Therapy   Participation Level:  Did Not Attend despite MHT prompting   Ambrose Mantle, LCSW 10/31/2014, 1:09 PM

## 2014-10-31 NOTE — Progress Notes (Signed)
1:1 NOTE  Pt at this time is resting in bed without no respiratory distress noted. 1:1 staff at this time is actively observing pt. 15-minute safety checks also continues.

## 2014-10-31 NOTE — [facility] Counselor (Signed)
CSW attempt to complete PSA unsuccessful as patient currently sleeping due to medications; patient reportedly medicated due to erratic behaviors. Remains on 1:1 for safety Carney Bern, LCSW

## 2014-10-31 NOTE — Progress Notes (Signed)
Adult Psychoeducational Group Note  Date:  10/31/2014 Time:  9:24 PM  Group Topic/Focus:  Wrap-Up Group:   The focus of this group is to help patients review their daily goal of treatment and discuss progress on daily workbooks.  Participation Level:  Active  Participation Quality:  Appropriate  Affect:  Appropriate  Cognitive:  Appropriate  Insight: Appropriate  Engagement in Group:  Engaged  Modes of Intervention:  Discussion  Additional Comments:The patient expressed that she attended group today.The patient also attended wrap up reviewing the nightly rules.  Octavio Manns 10/31/2014, 9:24 PM

## 2014-10-31 NOTE — Progress Notes (Signed)
Patient ID: [name], female   DOB: [birth year], 54 y.o.   MRN: [id number]   NURSING 1:1 NOTE  D: Pt in dayroom eating dinner, patient is alert and oriented. Pt has been engaging appropriately with staff and peers. Pt reported that she feels much better and is glad to be getting her mind back. Pt reports being negative SI/HI, no AH/VH noted. A: 1:1 continued for patient safety. R: Pt safety maintained.

## 2014-10-31 NOTE — [facility] Suicide Risk Assessment (Signed)
[facility] Admission Suicide Risk Assessment   Nursing information obtained from:  Patient Demographic factors:  Unemployed Current Mental Status:  NA Loss Factors:  Decline in physical health Historical Factors:  Impulsivity Risk Reduction Factors:  Positive social support Total Time spent with patient: 30 minutes Principal Problem: Schizophrenia Diagnosis:   Patient Active Problem List   Diagnosis Date Noted  . Schizophrenia [F20.9] 08/18/2010    Priority: High  . Psychosis [F29] 10/30/2014  . Psychoses [F29]   . Hyponatremia [E87.1] 10/22/2014  . Microscopic hematuria [R31.2] 01/29/2014  . Abdominal pain [R10.9] 12/12/2013  . Vulvar irritation [N90.89] 12/12/2013  . COPD (chronic obstructive pulmonary disease) [J44.9] 02/18/2013  . Tobacco abuse [Z72.0] 02/18/2013  . Hepatitis B infection [B16.9]   . Pre-ulcerative corn or callous [L84] 05/07/2012  . Urinary incontinence, nocturnal enuresis [N39.44] 05/07/2012  . Fecal incontinence [R15.9] 05/07/2012  . Systolic murmur [R01.1] 05/07/2012  . Routine health maintenance [Z00.00] 05/07/2012  . Asthma [493] 08/18/2010  . HIV (human immunodeficiency virus infection) [Z21] 08/18/2010  . Herpes simplex infection [B00.9] 08/18/2010     Continued Clinical Symptoms:  Alcohol Use Disorder Identification Test Final Score (AUDIT): 0 The "Alcohol Use Disorders Identification Test", Guidelines for Use in Primary Care, Second Edition.  World Science writer [facility]). Score between 0-7:  no or low risk or alcohol related problems. Score between 8-15:  moderate risk of alcohol related problems. Score between 16-19:  high risk of alcohol related problems. Score 20 or above:  warrants further diagnostic evaluation for alcohol dependence and treatment.   CLINICAL FACTORS:   Severe Anxiety and/or Agitation Schizophrenia:   Paranoid or undifferentiated type Currently Psychotic Previous Psychiatric Diagnoses and Treatments Medical Diagnoses and  Treatments/Surgeries   Musculoskeletal: Strength & Muscle Tone: within normal limits Gait & Station: unsteady Patient leans: Front  Psychiatric Specialty Exam: Physical Exam  Psychiatric: Her mood appears anxious. Her speech is tangential. She is actively hallucinating and combative. Thought content is paranoid and delusional. Cognition and memory are impaired. She expresses impulsivity.    Review of Systems  HENT: Negative.   Eyes: Negative.   Respiratory: Negative.   Cardiovascular: Negative.   Gastrointestinal: Positive for diarrhea.  Genitourinary: Negative.   Musculoskeletal: Negative.   Skin: Negative.   Neurological: Positive for weakness.  Endo/Heme/Allergies: Negative.   Psychiatric/Behavioral: Positive for hallucinations and memory loss. The patient is nervous/anxious and has insomnia.     Blood pressure 125/90, pulse 87, temperature 98 F (36.7 C), temperature source Other (Comment), resp. rate 16, height 4\' 11"  (1.499 m), weight 65.318 kg (144 lb).Body mass index is 29.07 kg/(m^2).  General Appearance: Disheveled  Eye Contact::  Minimal  Speech:  Garbled and Slurred  Volume:  Decreased  Mood:  Anxious and Dysphoric  Affect:  Constricted  Thought Process:  Circumstantial, Disorganized and Loose  Orientation:  Other:  disoriented  Thought Content:  Delusions and Paranoid Ideation  Suicidal Thoughts:  unable to assess-patient too disorganized  Homicidal Thoughts:  unable to assess-patient too disorganized  Memory:  Immediate;   Poor Recent;   Poor Remote;   Poor  Judgement:  Impaired  Insight:  Lacking  Psychomotor Activity:  Restlessness  Concentration:  Poor  Recall:  Poor  Fund of Knowledge:Poor  Language: Fair  Akathisia:  No  Handed:  Right  AIMS (if indicated):     Assets:  Social Support  Sleep:  Number of Hours: 5  Cognition: Impaired,  Mild  ADL's:  Impaired     COGNITIVE FEATURES  THAT CONTRIBUTE TO RISK:  Closed-mindedness, Polarized  thinking and Thought constriction (tunnel vision)    SUICIDE RISK:   Minimal: No identifiable suicidal ideation.  Patients presenting with no risk factors but with morbid ruminations; may be classified as minimal risk based on the severity of the depressive symptoms  PLAN OF CARE: 1. Admit for crisis management and stabilization. 2. Medication management to reduce current symptoms to base line and improve the patient's overall level of functioning 3. Treat health problems as indicated. 4. Develop treatment plan to decrease risk of relapse upon discharge and the need for readmission. 5. Psycho-social education regarding relapse prevention and self care. 6. Health care follow up as needed for medical problems. 7. Restart home medications where appropriate.   Medical Decision Making:  Review or order clinical lab tests (1), Established Problem, Worsening (2), Review of Medication Regimen & Side Effects (2) and Review of New Medication or Change in Dosage (2)  I certify that inpatient services furnished can reasonably be expected to improve the patient's condition.   Thedore Mins, MD 10/31/2014, 11:47 AM

## 2014-11-01 DIAGNOSIS — F200 Paranoid schizophrenia: Secondary | ICD-10-CM | POA: Insufficient documentation

## 2014-11-01 MED ORDER — ONDANSETRON HCL 4 MG PO TABS
4.0000 mg | ORAL_TABLET | Freq: Three times a day (TID) | ORAL | Status: DC | PRN
  Administered 2014-11-01 – 2014-11-06 (×7): 4 mg via ORAL
  Filled 2014-11-01 (×7): qty 1

## 2014-11-01 NOTE — Progress Notes (Addendum)
Patient ID: [name], female   DOB: 26-May-1960, 54 y.o.   MRN: [id number]   NURSING 1:1 NOTE  D: Pt continues to be very loud on the unit today. Pt does continue to have moments of confusion. Pt does not appear to be responding to internal stimuli. Pt has not had any moments of incontinence of bladder or bowels. Pt has been in the dayroom interacting with her peers without incident. Pt reported that she was negative SI/HI, no AH/VH noted. A: 1:1 continued for patient safety. R: Pts safety maintained.

## 2014-11-01 NOTE — Progress Notes (Signed)
Patient ID: [name], female   DOB: Aug 13, 1960, 54 y.o.   MRN: [id number]   NURSING 1:1 NOTE  D: Pt continues to be very loud on the unit today. Pt does continue to have moments of confusion. Pt does not appear to be responding to internal stimuli. Pt reported on her self inventory sheet that her depression was a 10, her hopelessness was a 10, and her anxiety was a 0. Pt reported that her goal was to work on her self esteem. Pt reported that she was negative SI/HI, no AH/VH noted. A: 1:1 continued for patient safety. R: Pts safety maintained.

## 2014-11-01 NOTE — [facility] Group Notes (Signed)
[facility] Group Notes:  (Clinical Social Work)  11/01/2014  [facility] Group Notes:  (Clinical Social Work)  11/01/2014  11:00AM-12:00PM  Summary of Progress/Problems:  The main focus of today's process group was to listen to a variety of genres of music and to identify that different types of music provoke different responses.  The patient then was able to identify personally what was soothing for them, as well as energizing.    The patient moved constantly around the room, sang very loudly but not with each song's actual words, and danced a good deal.  She was redirectable to sing more quietly and to dance more appropriately, but would soon engage in the behaviors again.  Type of Therapy:  Music Therapy   Participation Level:  Active  Participation Quality:  Attentive and Intrusive  Affect:  Blunted  Cognitive:  Disorganized  Insight:  Limited  Engagement in Therapy:  Engaged  Modes of Intervention:   Activity, Exploration  Ambrose Mantle, LCSW 11/01/2014
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
## 2014-11-01 NOTE — Progress Notes (Signed)
[facility] MD Progress Note  11/01/2014 2:43 PM [name]  MRN:  [id number] Subjective: Patient seen, interviewed, chart reviewed and case discussed with treatment team. Patient is a poor historian with a long history of mental illness and multiple medical problems. She appears confused, disoriented and has difficulty  to understand and following simple directions, she requires recurrent redirections. Patient continues to talk to herself and  people that are not there as if responding to internal stimuli. Patient also has a fixed delusions of some unknown people ''poisoning my coffee''. Patient appears anxious, restless, fidgety, paces the hall way up and down but denies SI and HI.   Principal Problem: Paranoid Schizophrenia Diagnosis:   Patient Active Problem List   Diagnosis Date Noted  . Schizophrenia [F20.9] 08/18/2010    Priority: High  . Psychosis [F29] 10/30/2014  . Psychoses [F29]   . Hyponatremia [E87.1] 10/22/2014  . Microscopic hematuria [R31.2] 01/29/2014  . Abdominal pain [R10.9] 12/12/2013  . Vulvar irritation [N90.89] 12/12/2013  . COPD (chronic obstructive pulmonary disease) [J44.9] 02/18/2013  . Tobacco abuse [Z72.0] 02/18/2013  . Hepatitis B infection [B16.9]   . Pre-ulcerative corn or callous [L84] 05/07/2012  . Urinary incontinence, nocturnal enuresis [N39.44] 05/07/2012  . Fecal incontinence [R15.9] 05/07/2012  . Systolic murmur [R01.1] 05/07/2012  . Routine health maintenance [Z00.00] 05/07/2012  . Asthma [493] 08/18/2010  . HIV (human immunodeficiency virus infection) [Z21] 08/18/2010  . Herpes simplex infection [B00.9] 08/18/2010   Total Time spent with patient: 30 minutes   Past Medical History:  Past Medical History  Diagnosis Date  . GERD (gastroesophageal reflux disease)   . Schizophrenia   . AIDS 12-10-2007  . Herpes simplex   . History of cholecystectomy   . Cataract   . Nonspecific reaction to tuberculin skin test without active tuberculosis 11-2008  [facility]   . Seizures   . Asthmatic bronchitis , chronic   . Hepatitis B infection     followed by ID  . Chronic hyponatremia     Past Surgical History  Procedure Laterality Date  . Cholecystectomy    . Leg surgery      right leg, s/p accident   Family History:  Family History  Problem Relation Age of Onset  . Diabetes Sister   . Cancer Sister     breast  . Diabetes Brother   . Hypertension Brother   . Cancer Mother     lung  . Hypertension Mother   . Heart disease Father    Social History:  History  Alcohol Use No     History  Drug Use No    History   Social History  . Marital Status: Single    Spouse Name: N/A  . Number of Children: N/A  . Years of Education: N/A   Social History Main Topics  . Smoking status: Current Every Day Smoker -- 2.00 packs/day for 35 years    Types: Cigarettes    Start date: 04/10/1974  . Smokeless tobacco: Never Used     Comment: Previously smoked 2ppd, might start patch  . Alcohol Use: No  . Drug Use: No  . Sexual Activity: Not Currently     Comment: pt. declined condoms   Other Topics Concern  . None   Social History Narrative   Additional History:    Sleep: Fair  Appetite:  Fair   Assessment:   Musculoskeletal: Strength & Muscle Tone: within normal limits Gait & Station: normal Patient leans: N/A   Psychiatric Specialty  Exam: Physical Exam  Psychiatric: Her mood appears anxious. Her affect is inappropriate. Her speech is tangential. She is actively hallucinating and combative. Thought content is paranoid and delusional. Cognition and memory are impaired. She expresses impulsivity.    Review of Systems  Constitutional: Negative.   HENT: Negative.   Eyes: Negative.   Respiratory: Negative.   Cardiovascular: Negative.   Gastrointestinal: Negative.   Genitourinary: Negative.   Musculoskeletal: Positive for myalgias.  Skin: Negative.   Neurological: Negative.   Endo/Heme/Allergies: Negative.    Psychiatric/Behavioral: Positive for hallucinations. The patient is nervous/anxious.     Blood pressure 98/65, pulse 73, temperature 97.7 F (36.5 C), temperature source Oral, resp. rate 18, height 4\' 11"  (1.499 m), weight 65.318 kg (144 lb).Body mass index is 29.07 kg/(m^2).  General Appearance: Casual  Eye Contact::  Minimal  Speech:  Pressured  Volume:  Decreased  Mood:  Anxious and Dysphoric  Affect:  Constricted  Thought Process:  Circumstantial and Disorganized  Orientation:  Other:  disoriented  Thought Content:  Delusions, Hallucinations: Auditory, Paranoid Ideation and Rumination  Suicidal Thoughts:  No  Homicidal Thoughts:  No  Memory:  Immediate;   Poor Recent;   Poor Remote;   Poor  Judgement:  Impaired  Insight:  Lacking  Psychomotor Activity:  Restlessness  Concentration:  Poor  Recall:  Poor  Fund of Knowledge:Poor  Language: Fair  Akathisia:  No  Handed:  Right  AIMS (if indicated):     Assets:  Desire for Improvement  ADL's:  Impaired  Cognition: Impaired,  Mild  Sleep:  Number of Hours: 6     Current Medications: Current Facility-Administered Medications  Medication Dose Route Frequency Provider Last Rate Last Dose  . albuterol (PROVENTIL HFA;VENTOLIN HFA) 108 (90 BASE) MCG/ACT inhaler 2 puff  2 puff Inhalation Q4H PRN Earney Navy, NP   2 puff at 11/01/14 0846  . beclomethasone (QVAR) 80 MCG/ACT inhaler 1 puff  1 puff Inhalation BID Jomarie Longs, MD   1 puff at 11/01/14 0846  . benztropine (COGENTIN) tablet 0.5 mg  0.5 mg Oral BID Earney Navy, NP   0.5 mg at 11/01/14 0847  . Darunavir Ethanolate (PREZISTA) tablet 800 mg  800 mg Oral Q breakfast Earney Navy, NP   800 mg at 11/01/14 0847  . emtricitabine-tenofovir (TRUVADA) 200-300 MG per tablet 1 tablet  1 tablet Oral Daily Earney Navy, NP   1 tablet at 11/01/14 0847  . famotidine (PEPCID) tablet 20 mg  20 mg Oral BID Earney Navy, NP   20 mg at 11/01/14 0847  .  gabapentin (NEURONTIN) capsule 300 mg  300 mg Oral BID Mystie Ormand   300 mg at 11/01/14 0847  . haloperidol (HALDOL) tablet 5 mg  5 mg Oral BID Aavya Shafer   5 mg at 11/01/14 0847  . hydrOXYzine (ATARAX/VISTARIL) tablet 25 mg  25 mg Oral Q12H PRN Kortni Hasten   25 mg at 10/31/14 2112  . LORazepam (ATIVAN) tablet 1 mg  1 mg Oral BID Darcell Sabino   1 mg at 11/01/14 0847  . OLANZapine zydis (ZYPREXA) disintegrating tablet 5 mg  5 mg Oral Q6H PRN Adonis Brook, NP   5 mg at 10/31/14 1348  . raltegravir (ISENTRESS) tablet 400 mg  400 mg Oral BID Earney Navy, NP   400 mg at 11/01/14 0847  . ritonavir (NORVIR) tablet 100 mg  100 mg Oral Q breakfast Earney Navy, NP   100 mg at  11/01/14 0847  . traZODone (DESYREL) tablet 50 mg  50 mg Oral QHS Jovonta Levit   50 mg at 10/31/14 2112    Lab Results: No results found for this or any previous visit (from the past 48 hour(s)).  Physical Findings: AIMS: Facial and Oral Movements Muscles of Facial Expression: None, normal Lips and Perioral Area: None, normal Jaw: None, normal Tongue: None, normal,Extremity Movements Upper (arms, wrists, hands, fingers): None, normal Lower (legs, knees, ankles, toes): None, normal, Trunk Movements Neck, shoulders, hips: None, normal, Overall Severity Severity of abnormal movements (highest score from questions above): None, normal Incapacitation due to abnormal movements: None, normal Patient's awareness of abnormal movements (rate only patient's report): No Awareness, Dental Status Current problems with teeth and/or dentures?: No Does patient usually wear dentures?: No  CIWA:    COWS:     Treatment Plan Summary: Daily contact with patient to assess and evaluate symptoms and progress in treatment, Medication management. Plan admit to inpatient for stabilization and initiate medication management. Continue Trazodone 50mg  Qhs for insomnia Continue Haldol 5mg  bid for paranoid  schizophrenia Continue Cogentin 0.5mg  bid for EPS Prevention Treat health problems as indicated. Develop treatment plan to decrease risk of relapse upon discharge and the need for     readmission. Psycho-social education regarding relapse prevention and self care. Health care follow up as needed for medical problems. Restart home medications where appropriate.    Medical Decision Making:  Review or order clinical lab tests (1), Established Problem, Worsening (2), Review of Medication Regimen & Side Effects (2) and Review of New Medication or Change in Dosage (2)     Thedore Mins, MD 11/01/2014, 2:43 PM

## 2014-11-01 NOTE — Progress Notes (Signed)
Did not attend group 

## 2014-11-01 NOTE — Progress Notes (Signed)
1:1 NOTE  Pt at this time in appear to be sleeping quietly in bed without no respiratory distress noted. 1:1 staff at this time is actively observing pt. 15-minute safety checks also continues. 
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
## 2014-11-01 NOTE — Progress Notes (Signed)
The patient was just observed vomiting into her toilet. The nurse has been notified.

## 2014-11-01 NOTE — Progress Notes (Addendum)
Patient ID: [name], female   DOB: 14-Jun-1960, 54 y.o.   MRN: [id number]   NURSING 1:1 NOTE  D: Pt continues to be very loud on the unit today, however has not been verbally or physically aggressive. Pt does continue to have moments of confusion. Pt does not appear to be responding to internal stimuli. Pt has not had any moments of incontinence of bladder or bowels.  Pt reported that she was negative SI/HI, no AH/VH noted. A: 1:1 continued for patient safety. R: Pts safety maintained.

## 2014-11-02 DIAGNOSIS — F200 Paranoid schizophrenia: Principal | ICD-10-CM
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
## 2014-11-02 NOTE — Progress Notes (Signed)
D: Pt visible in dayroom with assigned 1:1 staff majority of this shift. Affect and mood bright, observed interacting well with staff and peers. Attended all scheduled groups. Showered with assistance of assigned 1:1 staff. Tolerated all meals well. No fall event to note thus far this shift.  A: 1:1 observation maintained as ordered for pt's safety. Support, availability and encouragement provided to pt. No physical distress to note at this time.  R: Pt receptive to care. Remains safe on unit. Plan of care continues.

## 2014-11-02 NOTE — Progress Notes (Signed)
[facility] Group Notes:  (Nursing/MHT/Case Management/Adjunct)  Date:  11/02/2014  Time:  8:59 PM  Type of Therapy:  Psychoeducational Skills  Participation Level:  Active  Participation Quality:  Intrusive and Monopolizing  Affect:  Excited  Cognitive:  Appropriate  Insight:  Improving  Engagement in Group:  Monopolizing  Modes of Intervention:  Education  Summary of Progress/Problems: The patient shared in group this evening that she had a "very nice" day overall. The patient explained that she had a good visit with her brother and that he has been supportive of her. As a theme for the day, her wellness strategy will include stopping her suicidal thoughts. She admits that having suicidal thoughts hinders her progress.   Hazle Coca S 11/02/2014, 8:59 PM

## 2014-11-02 NOTE — Progress Notes (Signed)
  1:1 NOTE  Pt at this time in appear to be sleeping quietly in bed without no respiratory distress noted. 1:1 staff at this time is actively observing pt. 15-minute safety checks also continues. 

## 2014-11-02 NOTE — Progress Notes (Signed)
Patient ID: [name], female   DOB: 1960-08-07, 54 y.o.   MRN: [id number] [facility] MD Progress Note  11/02/2014 9:45 PM [name]  MRN:  [id number] Subjective: Patient report she is unsure why she is here . At this time denies medication side effects. Objective :  Patient on 1:1 for safety. As discussed with staff has improved partially since admission and has been less intrusive, less loud and more cooperative . At this time no gross agitation, not aggressive, but poor historian and difficulty responding to questions, remains quite tangential.  Insight into illness is very limited . As per chart , she has long history of mental illness, has been diagnosed with schizophrenia, and is connected to ACT team services. Reportedly , brother came to visit earlier and she tolerated visit well.  She denies medication side effects. She is aware she is in hospital and that it is July, other than this it is difficult to assess orientation as only partially cooperative .     Principal Problem: Paranoid Schizophrenia Diagnosis:   Patient Active Problem List   Diagnosis Date Noted  . Paranoid schizophrenia [F20.0]   . Psychosis [F29] 10/30/2014  . Psychoses [F29]   . Hyponatremia [E87.1] 10/22/2014  . Microscopic hematuria [R31.2] 01/29/2014  . Abdominal pain [R10.9] 12/12/2013  . Vulvar irritation [N90.89] 12/12/2013  . COPD (chronic obstructive pulmonary disease) [J44.9] 02/18/2013  . Tobacco abuse [Z72.0] 02/18/2013  . Hepatitis B infection [B16.9]   . Pre-ulcerative corn or callous [L84] 05/07/2012  . Urinary incontinence, nocturnal enuresis [N39.44] 05/07/2012  . Fecal incontinence [R15.9] 05/07/2012  . Systolic murmur [R01.1] 05/07/2012  . Routine health maintenance [Z00.00] 05/07/2012  . Asthma [493] 08/18/2010  . HIV (human immunodeficiency virus infection) [Z21] 08/18/2010  . Schizophrenia [F20.9] 08/18/2010  . Herpes simplex infection [B00.9] 08/18/2010   Total Time spent with  patient: 25 minutes    Past Medical History:  Past Medical History  Diagnosis Date  . GERD (gastroesophageal reflux disease)   . Schizophrenia   . AIDS 12-10-2007  . Herpes simplex   . History of cholecystectomy   . Cataract   . Nonspecific reaction to tuberculin skin test without active tuberculosis 11-2008 [facility]   . Seizures   . Asthmatic bronchitis , chronic   . Hepatitis B infection     followed by ID  . Chronic hyponatremia     Past Surgical History  Procedure Laterality Date  . Cholecystectomy    . Leg surgery      right leg, s/p accident   Family History:  Family History  Problem Relation Age of Onset  . Diabetes Sister   . Cancer Sister     breast  . Diabetes Brother   . Hypertension Brother   . Cancer Mother     lung  . Hypertension Mother   . Heart disease Father    Social History:  History  Alcohol Use No     History  Drug Use No    History   Social History  . Marital Status: Single    Spouse Name: N/A  . Number of Children: N/A  . Years of Education: N/A   Social History Main Topics  . Smoking status: Current Every Day Smoker -- 2.00 packs/day for 35 years    Types: Cigarettes    Start date: 04/10/1974  . Smokeless tobacco: Never Used     Comment: Previously smoked 2ppd, might start patch  . Alcohol Use: No  . Drug Use:  No  . Sexual Activity: Not Currently     Comment: pt. declined condoms   Other Topics Concern  . None   Social History Narrative   Additional History:    Sleep: Fair  Appetite:  Fair   Assessment:   Musculoskeletal: Strength & Muscle Tone: within normal limits Gait & Station: normal Patient leans: N/A   Psychiatric Specialty Exam: Physical Exam  Psychiatric: Her mood appears anxious. Her affect is inappropriate. Her speech is tangential. She is actively hallucinating and combative. Thought content is paranoid and delusional. Cognition and memory are impaired. She expresses impulsivity.    Review of Systems   Constitutional: Negative.   HENT: Negative.   Eyes: Negative.   Respiratory: Negative.   Cardiovascular: Negative.   Gastrointestinal: Negative.   Genitourinary: Negative.   Musculoskeletal: Positive for myalgias.  Skin: Negative.   Neurological: Negative.   Endo/Heme/Allergies: Negative.   Psychiatric/Behavioral: Positive for hallucinations. The patient is nervous/anxious.     Blood pressure 97/54, pulse 69, temperature 97.7 F (36.5 C), temperature source Oral, resp. rate 18, height  (1.499 m), weight 144 lb (65.318 kg).Body mass index is 29.07 kg/(m^2).  General Appearance: Fairly Groomed  Patent attorney::  Fair  Speech:   Rapid at times   Volume:  Decreased  Mood:   Denies depression  Affect:   Blunted   Thought Process:   Disorganized   Orientation:  Other:  disoriented  Thought Content:  At this time denies hallucinations   Suicidal Thoughts:  No denies current suicidal ideations or self injurious thoughts   Homicidal Thoughts:  No  Memory:  Immediate;   Poor Recent;   Poor Remote;   Poor  Judgement:  Impaired  Insight:  Lacking  Psychomotor Activity:  Normal- not agitated at this time  Concentration:  Poor  Recall:  Poor  Fund of Knowledge:Poor  Language: Fair  Akathisia:  No  Handed:  Right  AIMS (if indicated):     Assets:  Desire for Improvement  ADL's:  Impaired  Cognition: Impaired,  Mild  Sleep:  Number of Hours: 5.75     Current Medications: Current Facility-Administered Medications  Medication Dose Route Frequency Provider Last Rate Last Dose  . albuterol (PROVENTIL HFA;VENTOLIN HFA) 108 (90 BASE) MCG/ACT inhaler 2 puff  2 puff Inhalation Q4H PRN Earney Navy, NP   2 puff at 11/01/14 0846  . beclomethasone (QVAR) 80 MCG/ACT inhaler 1 puff  1 puff Inhalation BID Jomarie Longs, MD   1 puff at 11/02/14 1703  . benztropine (COGENTIN) tablet 0.5 mg  0.5 mg Oral BID Earney Navy, NP   0.5 mg at 11/02/14 1703  . Darunavir Ethanolate  (PREZISTA) tablet 800 mg  800 mg Oral Q breakfast Earney Navy, NP   800 mg at 11/02/14 1610  . emtricitabine-tenofovir (TRUVADA) 200-300 MG per tablet 1 tablet  1 tablet Oral Daily Earney Navy, NP   1 tablet at 11/02/14 9604  . famotidine (PEPCID) tablet 20 mg  20 mg Oral BID Earney Navy, NP   20 mg at 11/02/14 1703  . gabapentin (NEURONTIN) capsule 300 mg  300 mg Oral BID Mojeed Akintayo   300 mg at 11/02/14 1703  . haloperidol (HALDOL) tablet 5 mg  5 mg Oral BID Mojeed Akintayo   5 mg at 11/02/14 1703  . hydrOXYzine (ATARAX/VISTARIL) tablet 25 mg  25 mg Oral Q12H PRN Mojeed Akintayo   25 mg at 11/01/14 2022  . LORazepam (ATIVAN) tablet 1  mg  1 mg Oral BID Mojeed Akintayo   1 mg at 11/02/14 1703  . OLANZapine zydis (ZYPREXA) disintegrating tablet 5 mg  5 mg Oral Q6H PRN Adonis Brook, NP   5 mg at 10/31/14 1348  . ondansetron (ZOFRAN) tablet 4 mg  4 mg Oral Q8H PRN Worthy Flank, NP   4 mg at 11/01/14 2323  . raltegravir (ISENTRESS) tablet 400 mg  400 mg Oral BID Earney Navy, NP   400 mg at 11/02/14 1703  . ritonavir (NORVIR) tablet 100 mg  100 mg Oral Q breakfast Earney Navy, NP   100 mg at 11/02/14 1610  . traZODone (DESYREL) tablet 50 mg  50 mg Oral QHS Mojeed Akintayo   50 mg at 11/02/14 2113    Lab Results: No results found for this or any previous visit (from the past 48 hour(s)).  Physical Findings: AIMS: Facial and Oral Movements Muscles of Facial Expression: None, normal Lips and Perioral Area: None, normal Jaw: None, normal Tongue: None, normal,Extremity Movements Upper (arms, wrists, hands, fingers): None, normal Lower (legs, knees, ankles, toes): None, normal, Trunk Movements Neck, shoulders, hips: None, normal, Overall Severity Severity of abnormal movements (highest score from questions above): None, normal Incapacitation due to abnormal movements: None, normal Patient's awareness of abnormal movements (rate only patient's report): No  Awareness, Dental Status Current problems with teeth and/or dentures?: No Does patient usually wear dentures?: No  CIWA:    COWS:      Assessment - at this time patient less agitated, less intrusive, less labile. Calmer, and better able to follow redirections from staff- denies medication  Side effects. Still significantly tangential, and insight is limited .  Treatment Plan Summary: Daily contact with patient to assess and evaluate symptoms and progress in treatment, Medication management. Continue Trazodone 50mg  Qhs for insomnia Continue Haldol 5mg  bid for paranoid schizophrenia Continue Cogentin 0.5mg  bid for EPS Prevention Continue Neurontin 300 mgrs bid for anxiety Continue Antiretroviral medications for HIV management     Medical Decision Making:  Review or order clinical lab tests (1), Established Problem, Worsening (2), Review of Medication Regimen & Side Effects (2) and Review of New Medication or Change in Dosage (2)     Nehemiah Massed, MD 11/02/2014, 9:45 PM

## 2014-11-02 NOTE — Progress Notes (Signed)
D: Pt alert in dayroom. Voice no concerns to staff at this time. Observed watching TV.  Pt tolerated all meals well, without report of emesis to note thus far this shift.  A: Scheduled medications administered as ordered. Pt encouraged to voice needs. Safety maintained on 1:1 observation level with assigned staff in attendance at all times. Support and encouragement offered.  R: Pt cooperative with care and compliant with current treatment regimen. Remains safe on unit. No physical distress to note at this time.

## 2014-11-02 NOTE — Plan of Care (Signed)
Problem: Ineffective individual coping Goal: STG: Patient will remain free from self harm Outcome: Progressing Safety maintained on 1:1 observation level as ordered without gestures or incident of self harm to note at this time. Pt is verbally redirectable at this time, able to follow short directions.

## 2014-11-02 NOTE — Progress Notes (Signed)
D: Pt alert and oriented to self at present. Pt presents with bright affect and mood. Observed in wheelchair at this time due to unsteady gait. Cooperative with ward rules and routines. Denies SI, HI, AVH and pain. Pt remains delusional and paranoid in her thought process and does not appear to be responding to internal stimuli at this time. No episode of emesis witnessed or reported thus far this shift. A: All medications administered as per MD's orders. 1:1 contact made with pt to assess needs and conduct assessments. Support, encouragement and availability offered. Safety maintained on 1:1 observation level as per order.  R: Pt has been compliant with medications as ordered. Remains safe on unit and is verbally redirectable at this time.

## 2014-11-02 NOTE — Progress Notes (Addendum)
D Pt. Denies pain, denies SI and HI at present time.  A Writer offered support and encouragement. Attempted to discuss with pt. Reason for her admission to [facility]  R Pt. Is tangential, discussed cooking collards etc. During the assessment, and report the MD told her she could go outside and have a cookout for [place].  Pt. Is pleasant, very loud which appears to annoy some of her peers.  Pt. Is redirectable.  Pt. Is using a wheelchair due to unsteady gait and has a sitter at her side. Pt. Remains safe on the unit.
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
## 2014-11-02 NOTE — Progress Notes (Signed)
1:1 NOTE  Pt at this time in appear to be sleeping quietly in bed without no respiratory distress noted. 1:1 staff at this time is actively observing pt. 15-minute safety checks also continues. 

## 2014-11-02 NOTE — Progress Notes (Signed)
1:1 NOTE  Pt at this time sitting in bed after 2  episodes of throwing   up. 1:1 staff is proving some support and encouragement . 15-minute safety checks also continues.
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
## 2014-11-03 NOTE — Progress Notes (Signed)
D) Pt has been out in [place], sitting in dayroom watching tv. Pt is hyperverbal, thoughts disorganized and delusional. Pt has become irritable c/o "abdomen hurts". Pt trying to have a bowel movement with out success. Pt asked Clinical research associate for some "sleeping medicine". A) level 1 obs for safety, support and encouragement provided. Med ed reinforced. R) safety maintained.

## 2014-11-03 NOTE — Progress Notes (Signed)
D) Pt has been sitting in the dayroom with sitter at her side. Pt is in a pleasantly confused, disorganized mood. Speech is slightly pressured, thoughts tangential and disorganized. Pt compliant with all medications on a.m. Med pass. Pt stated she is waiting to "get my teeth" and was distressed about that. A) Level 1 obs continued. Support and reassurance provided. Med ed reinforced. R) Safety maintained.
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
## 2014-11-03 NOTE — Progress Notes (Signed)
Adult Psychoeducational Group Note  Date:  11/03/2014 Time:  11:39 PM  Group Topic/Focus:  Wrap-Up Group:   The focus of this group is to help patients review their daily goal of treatment and discuss progress on daily workbooks.  Participation Level:  Did Not Attend  Participation Quality:  Pt did not attend this group.  Affect:  Pt did not attend this group.  Cognitive:  Pt did not attend this group.  Insight: None  Engagement in Group:  None  Modes of Intervention:  Discussion and Education  Additional Comments:  Pt did not attend this group session.  Malachy Moan 11/03/2014, 11:39 PM

## 2014-11-03 NOTE — Progress Notes (Signed)
D) Pt continued to c/o abdominal upset, wailing and crying out. Pt did vomit a copious amount of food and fluid as well as bowel movement x 2. Pt expressed relief and stated she felt "much better". Pt currently pleasantly confused. A) Level 1 obs supported for safety. Support and reassurance provided. meds as ordered, and med ed reinforced. R) Cooperative.

## 2014-11-03 NOTE — Progress Notes (Signed)
Patient had episode of emesis at 2300. Patient vomited large amount of emesis in bed and on self.  MHT cleaned patients bed while patient cleaned self in bathroom.

## 2014-11-03 NOTE — [facility] Group Notes (Signed)
[facility] LCSW Group Therapy  11/03/2014 , 5:06 PM   Type of Therapy:  Group Therapy  Participation Level:  Active  Participation Quality:  Attentive  Affect:  Appropriate  Cognitive:  Alert  Insight:  Improving  Engagement in Therapy:  Engaged  Modes of Intervention:  Discussion, Exploration and Socialization  Summary of Progress/Problems: Today's group focused on the term Diagnosis.  Participants were asked to define the term, and then pronounce whether it is a negative, positive or neutral term.  Stayed the entire time.  Did not need to dismiss her like yesterday, when she was disruptive due to impulsive talking out.  A bit more subdued, and willing to put up her hand to be called on prior to speaking.  However, comments, like yesterday, were unrelated to subject material.  Daryel Gerald B 11/03/2014 , 5:06 PM

## 2014-11-04 DIAGNOSIS — F209 Schizophrenia, unspecified: Secondary | ICD-10-CM

## 2014-11-04 NOTE — [facility] Group Notes (Signed)
[facility] LCSW Group Therapy  11/04/2014 2:47 PM   Type of Therapy:  Group Therapy  Participation Level:  Active  Participation Quality:  Attentive  Affect:  Appropriate  Cognitive:  Appropriate  Insight:  Improving  Engagement in Therapy:  Engaged  Modes of Intervention:  Clarification, Education, Exploration and Socialization  Summary of Progress/Problems: Today's group focused on relapse prevention.  We defined the term, and then brainstormed on ways to prevent relapse.  Myisha took one break, but was in group the rest of the time.  Animated, impulsive and difficult to redirect.  However, her contributions were subject related and more linear.  Some tangential thought, but able to bring self back to current subject  Ida Rogue 11/04/2014 , 2:47 PM

## 2014-11-04 NOTE — Progress Notes (Signed)
Patient continues on 1:1 with MHT. No sign or symptoms of distress. Patient is currently in bed resting with eyes closed. Will continue to monitor with 15 min checks and 1:1. 

## 2014-11-04 NOTE — Progress Notes (Signed)
1:1 note: Patient observed in bed, appears to be sleeping; no s/s of acute distress noted, breathing non labored, respirations equal. Undersign at bedside to administered morning medication, woke up for medication regimen with much encouragement. Encouraged to eat breakfast tray at bedside. MHT at bedside. Will continue to monitor on 1:1 observation for safety.

## 2014-11-04 NOTE — Progress Notes (Signed)
1:1 observation note: Patient's behavior cooperative. Denies SI/HI. Reports she enjoyed recreational therapy. Went to the dining room to eat lunch with her peers ate 100%. Using wheelchair to assist with ambulation. Remains on 1:1 for safety.

## 2014-11-04 NOTE — Progress Notes (Signed)
1:1 observation note: Patient noted in the day room in wheelchair,watching television, behavior calm and cooperative. She denies SI/HI. Compliant with afternoon medication regimen. Reports her goal for the day is to "use the bathroom at the right time." Assigned MHT with patient. No falls. Will continue to monitor on 1:1 for safety.

## 2014-11-04 NOTE — Progress Notes (Signed)
Patient ID: [name], female   DOB: April 24, 1960, 54 y.o.   MRN: [id number] [facility] MD Progress Note  11/04/2014 5:50 PM [name]  MRN:  [id number] Subjective:  Patient states  She is feeling better and is hoping she can be discharged soon. She denies medication side effects. She states her brother, with whom she lives, is going to visit later today, and she is hoping " he'll say I can go home ".   Objective :  I have discussed case with treatment team and have met with patient.  Patient remains  on 1:1 for safety.  As discussed with staff she continues to improve - remains pleasant, cooperative, and is not presenting with any agitation or disruptive behaviors on unit. She is denying hallucinations at this time, and at  Present does not appear internally preoccupied . She seems more reality focused, and states she realizes she is in hospital and that at this time her goal is to be  Discharged soon. She has been going to groups and her attendance has improved- still tends to be tangential at times and needing redirection from staff. As discussed with CSW- patient's outpatient providers - ACT team have reported she is still not at her baseline.  She denies medication side effects.     Principal Problem: Paranoid Schizophrenia Diagnosis:   Patient Active Problem List   Diagnosis Date Noted  . Paranoid schizophrenia [F20.0]   . Psychosis [F29] 10/30/2014  . Psychoses [F29]   . Hyponatremia [E87.1] 10/22/2014  . Microscopic hematuria [R31.2] 01/29/2014  . Abdominal pain [R10.9] 12/12/2013  . Vulvar irritation [N90.89] 12/12/2013  . COPD (chronic obstructive pulmonary disease) [J44.9] 02/18/2013  . Tobacco abuse [Z72.0] 02/18/2013  . Hepatitis B infection [B16.9]   . Pre-ulcerative corn or callous [L84] 05/07/2012  . Urinary incontinence, nocturnal enuresis [N39.44] 05/07/2012  . Fecal incontinence [R15.9] 05/07/2012  . Systolic murmur [T05.6] 97/94/8016  . Routine health maintenance  [Z00.00] 05/07/2012  . Asthma [493] 08/18/2010  . HIV (human immunodeficiency virus infection) [Z21] 08/18/2010  . Schizophrenia [F20.9] 08/18/2010  . Herpes simplex infection [B00.9] 08/18/2010   Total Time spent with patient: 20 minutes   Past Medical History:  Past Medical History  Diagnosis Date  . GERD (gastroesophageal reflux disease)   . Schizophrenia   . AIDS 12-10-2007  . Herpes simplex   . History of cholecystectomy   . Cataract   . Nonspecific reaction to tuberculin skin test without active tuberculosis 11-2008 [facility]   . Seizures   . Asthmatic bronchitis , chronic   . Hepatitis B infection     followed by ID  . Chronic hyponatremia     Past Surgical History  Procedure Laterality Date  . Cholecystectomy    . Leg surgery      right leg, s/p accident   Family History:  Family History  Problem Relation Age of Onset  . Diabetes Sister   . Cancer Sister     breast  . Diabetes Brother   . Hypertension Brother   . Cancer Mother     lung  . Hypertension Mother   . Heart disease Father    Social History:  History  Alcohol Use No     History  Drug Use No    History   Social History  . Marital Status: Single    Spouse Name: N/A  . Number of Children: N/A  . Years of Education: N/A   Social History Main Topics  . Smoking  status: Current Every Day Smoker -- 2.00 packs/day for 35 years    Types: Cigarettes    Start date: 04/10/1974  . Smokeless tobacco: Never Used     Comment: Previously smoked 2ppd, might start patch  . Alcohol Use: No  . Drug Use: No  . Sexual Activity: Not Currently     Comment: pt. declined condoms   Other Topics Concern  . None   Social History Narrative   Additional History:    Sleep:  Improved   Appetite:   Improved    Assessment:   Musculoskeletal: Strength & Muscle Tone: within normal limits Gait & Station: normal Patient leans: N/A   Psychiatric Specialty Exam: Physical Exam  Psychiatric: Her mood appears  anxious. Her affect is inappropriate. Her speech is tangential. She is actively hallucinating and combative. Thought content is paranoid and delusional. Cognition and memory are impaired. She expresses impulsivity.    Review of Systems  Constitutional: Negative.   HENT: Negative.   Eyes: Negative.   Respiratory: Negative.   Cardiovascular: Negative.   Gastrointestinal: Negative.   Genitourinary: Negative.   Musculoskeletal: Positive for myalgias.  Skin: Negative.   Neurological: Negative.   Endo/Heme/Allergies: Negative.   Psychiatric/Behavioral: Positive for hallucinations. The patient is nervous/anxious.     Blood pressure 97/54, pulse 69, temperature 97.7 F (36.5 C), temperature source Oral, resp. rate 18, height 4' 11"  (1.499 m), weight 144 lb (65.318 kg).Body mass index is 29.07 kg/(m^2).  General Appearance: Fairly Groomed  Engineer, water::  Good  Speech:   Less pressured -  Volume:  Decreased  Mood:   Denies depression- presents euthymic   Affect:    Bright, smiles upon approach  Thought Process:    More focused on being discharged soon, still disorganized, becoming tangential with open ended questions  Orientation:   Knows " July, 2016, hospital." Does not appear delirious or grossly confused at this time   Thought Content:  At this time denies hallucinations , and does not appear internally preoccupied   Suicidal Thoughts:  No denies current suicidal ideations or self injurious thoughts   Homicidal Thoughts:  No  Memory:  Immediate;   Poor Recent;   Poor Remote;   Poor  Judgement:  Impaired  Insight:  Lacking  Psychomotor Activity:  Normal- not agitated at this time  Concentration:  Poor  Recall:  Poor  Fund of Knowledge:Poor  Language: Fair  Akathisia:  No  Handed:  Right  AIMS (if indicated):     Assets:  Desire for Improvement  ADL's:  Impaired  Cognition: Impaired,  Mild  Sleep:  Number of Hours: 6.5     Current Medications: Current Facility-Administered  Medications  Medication Dose Route Frequency Provider Last Rate Last Dose  . albuterol (PROVENTIL HFA;VENTOLIN HFA) 108 (90 BASE) MCG/ACT inhaler 2 puff  2 puff Inhalation Q4H PRN Delfin Gant, NP   2 puff at 11/01/14 0846  . beclomethasone (QVAR) 80 MCG/ACT inhaler 1 puff  1 puff Inhalation BID Ursula Alert, MD   1 puff at 11/04/14 1618  . benztropine (COGENTIN) tablet 0.5 mg  0.5 mg Oral BID Delfin Gant, NP   0.5 mg at 11/04/14 1617  . Darunavir Ethanolate (PREZISTA) tablet 800 mg  800 mg Oral Q breakfast Delfin Gant, NP   800 mg at 11/04/14 0826  . emtricitabine-tenofovir (TRUVADA) 200-300 MG per tablet 1 tablet  1 tablet Oral Daily Delfin Gant, NP   1 tablet at 11/04/14 0827  .  famotidine (PEPCID) tablet 20 mg  20 mg Oral BID Delfin Gant, NP   20 mg at 11/04/14 1618  . gabapentin (NEURONTIN) capsule 300 mg  300 mg Oral BID Mojeed Akintayo   300 mg at 11/04/14 1617  . haloperidol (HALDOL) tablet 5 mg  5 mg Oral BID Mojeed Akintayo   5 mg at 11/04/14 1618  . hydrOXYzine (ATARAX/VISTARIL) tablet 25 mg  25 mg Oral Q12H PRN Mojeed Akintayo   25 mg at 11/04/14 0347  . LORazepam (ATIVAN) tablet 1 mg  1 mg Oral BID Mojeed Akintayo   1 mg at 11/04/14 1618  . OLANZapine zydis (ZYPREXA) disintegrating tablet 5 mg  5 mg Oral Q6H PRN Kerrie Buffalo, NP   5 mg at 11/03/14 1548  . ondansetron (ZOFRAN) tablet 4 mg  4 mg Oral Q8H PRN Harriet Butte, NP   4 mg at 11/04/14 4259  . raltegravir (ISENTRESS) tablet 400 mg  400 mg Oral BID Delfin Gant, NP   400 mg at 11/04/14 1618  . ritonavir (NORVIR) tablet 100 mg  100 mg Oral Q breakfast Delfin Gant, NP   100 mg at 11/04/14 0828  . traZODone (DESYREL) tablet 50 mg  50 mg Oral QHS Mojeed Akintayo   50 mg at 11/03/14 2110    Lab Results: No results found for this or any previous visit (from the past 48 hour(s)).  Physical Findings: AIMS: Facial and Oral Movements Muscles of Facial Expression: None, normal Lips  and Perioral Area: None, normal Jaw: None, normal Tongue: None, normal,Extremity Movements Upper (arms, wrists, hands, fingers): None, normal Lower (legs, knees, ankles, toes): None, normal, Trunk Movements Neck, shoulders, hips: None, normal, Overall Severity Severity of abnormal movements (highest score from questions above): None, normal Incapacitation due to abnormal movements: None, normal Patient's awareness of abnormal movements (rate only patient's report): No Awareness, Dental Status Current problems with teeth and/or dentures?: No Does patient usually wear dentures?: No  CIWA:    COWS:      Assessment -   Patient continues to improve- in particular, she presents euthymic, pleasant, and denies any current depression. No SI or HI. She remains thought disordered, but this too has tended to improve . At this time she is focusing on wanting to be discharged soon. Wants to return to live with brother. Denies medication side effects  .  Treatment Plan Summary: Daily contact with patient to assess and evaluate symptoms and progress in treatment, Medication management. Continue Trazodone 77m Qhs for insomnia Continue Haldol 554mbid for paranoid schizophrenia Continue Cogentin 0.36m49mid for EPS Prevention Continue Neurontin 300 mgrs bid for anxiety Continue Antiretroviral medications for HIV management Patient is on 1 to 1 observation for safety, due to concern about fall risk related to gait difficulties, poor insight , and impulsivity.     Medical Decision Making:  Review or order clinical lab tests (1), Established Problem, Worsening (2), Review of Medication Regimen & Side Effects (2) and Review of New Medication or Change in Dosage (2)     COBNeita GarnetD 11/04/2014, 5:50 PM

## 2014-11-04 NOTE — Progress Notes (Signed)
Patient continues on 1:1 with MHT. No sign or symptoms of distress. Patient is currently in bed resting with eyes closed. Will continue to monitor with 15 min checks and 1:1.

## 2014-11-04 NOTE — [facility] Group Notes (Signed)
[facility] LCSW Aftercare Discharge Planning Group Note   11/04/2014 2:46 PM  Participation Quality:  Invited.  Did not attend.    Daryel Gerald B

## 2014-11-05 MED ORDER — RISPERIDONE MICROSPHERES 50 MG IM SUSR
50.0000 mg | INTRAMUSCULAR | Status: DC
Start: 2014-11-05 — End: 2014-11-06
  Administered 2014-11-05: 50 mg via INTRAMUSCULAR
  Filled 2014-11-05: qty 2

## 2014-11-05 MED ORDER — RISPERIDONE 1 MG PO TBDP
1.0000 mg | ORAL_TABLET | Freq: Every day | ORAL | Status: DC
  Administered 2014-11-05: 1 mg via ORAL
  Filled 2014-11-05 (×3): qty 1

## 2014-11-05 NOTE — Progress Notes (Signed)
1:1 NOTE  Pt at this time in appear to be sleeping quietly in bed without no respiratory distress noted. 1:1 staff at this time is actively observing pt. 15-minute safety checks also continues. 

## 2014-11-05 NOTE — Progress Notes (Signed)
D: [name] is in milieu sitting in wheelchair sipping her tea. She is still pleasant but confused. She is requesting to go ahead and take her pm meds.  A: Staff will continue to monitor on 1:1 for safety/fall risk R: Medications given per order. Will continue to monitor.

## 2014-11-05 NOTE — Progress Notes (Signed)
Patient ID: [name], female   DOB: 07/09/1960, 54 y.o.   MRN: [id number] [facility] MD Progress Note  11/05/2014 3:18 PM [name]  MRN:  [id number] Subjective:  Patient states that she feels she is doing better, wants to be able to go home. Patient adds that she has a sister who can pick her up but social worker states that the sister lives out of state and it is her brother who helps her out.   Objective : Patient is casually dressed, in a wheelchair, states that she wants to return back home. She does not seem to be responding to internal stimuli but still has difficulty in her thought processes. She does not seem to be responding to internal stimuli and denies being suicidal or homicidal.  Principal Problem: Paranoid Schizophrenia Diagnosis:   Patient Active Problem List   Diagnosis Date Noted  . Paranoid schizophrenia [F20.0]   . Psychosis [F29] 10/30/2014  . Psychoses [F29]   . Hyponatremia [E87.1] 10/22/2014  . Microscopic hematuria [R31.2] 01/29/2014  . Abdominal pain [R10.9] 12/12/2013  . Vulvar irritation [N90.89] 12/12/2013  . COPD (chronic obstructive pulmonary disease) [J44.9] 02/18/2013  . Tobacco abuse [Z72.0] 02/18/2013  . Hepatitis B infection [B16.9]   . Pre-ulcerative corn or callous [L84] 05/07/2012  . Urinary incontinence, nocturnal enuresis [N39.44] 05/07/2012  . Fecal incontinence [R15.9] 05/07/2012  . Systolic murmur [R01.1] 05/07/2012  . Routine health maintenance [Z00.00] 05/07/2012  . Asthma [493] 08/18/2010  . HIV (human immunodeficiency virus infection) [Z21] 08/18/2010  . Schizophrenia [F20.9] 08/18/2010  . Herpes simplex infection [B00.9] 08/18/2010   Total Time spent with patient: 15 minutes   Past Medical History:  Past Medical History  Diagnosis Date  . GERD (gastroesophageal reflux disease)   . Schizophrenia   . AIDS 12-10-2007  . Herpes simplex   . History of cholecystectomy   . Cataract   . Nonspecific reaction to tuberculin skin test  without active tuberculosis 11-2008 [facility]   . Seizures   . Asthmatic bronchitis , chronic   . Hepatitis B infection     followed by ID  . Chronic hyponatremia     Past Surgical History  Procedure Laterality Date  . Cholecystectomy    . Leg surgery      right leg, s/p accident   Family History:  Family History  Problem Relation Age of Onset  . Diabetes Sister   . Cancer Sister     breast  . Diabetes Brother   . Hypertension Brother   . Cancer Mother     lung  . Hypertension Mother   . Heart disease Father    Social History:  History  Alcohol Use No     History  Drug Use No    History   Social History  . Marital Status: Single    Spouse Name: N/A  . Number of Children: N/A  . Years of Education: N/A   Social History Main Topics  . Smoking status: Current Every Day Smoker -- 2.00 packs/day for 35 years    Types: Cigarettes    Start date: 04/10/1974  . Smokeless tobacco: Never Used     Comment: Previously smoked 2ppd, might start patch  . Alcohol Use: No  . Drug Use: No  . Sexual Activity: Not Currently     Comment: pt. declined condoms   Other Topics Concern  . None   Social History Narrative   Additional History:    Sleep:  fair  Appetite:  fair   Assessment: Patient is a 54 year old schizophrenic who as per the report has been on Risperdal Consta, along with a low-dose of risperidone at night and has been stable on it in the past. Patient still struggles with her thought processes and though doing better would benefit by being switched back to the Risperdal Consta and low dose of risperidone. Brother to visit patient in the afternoon to see how much better patient is doing. Will work on discharge planning for tomorrow if patient tolerates the change in medication well  Musculoskeletal: Strength & Muscle Tone: within normal limits Gait & Station: normal Patient leans: N/A   Psychiatric Specialty Exam: Physical Exam  Psychiatric: Her mood appears  anxious. Her affect is inappropriate. Her speech is tangential. She is actively hallucinating and combative. Thought content is paranoid and delusional. Cognition and memory are impaired. She expresses impulsivity.    Review of Systems  Constitutional: Positive for malaise/fatigue. Negative for fever, chills and weight loss.  HENT: Negative.  Negative for congestion and sore throat.   Eyes: Negative.  Negative for blurred vision, double vision and photophobia.  Respiratory: Negative.  Negative for cough, shortness of breath and wheezing.   Cardiovascular: Negative.  Negative for chest pain and palpitations.  Gastrointestinal: Negative.  Negative for heartburn, nausea, vomiting, abdominal pain, diarrhea and constipation.  Genitourinary: Negative.  Negative for dysuria and urgency.  Musculoskeletal: Positive for myalgias. Negative for falls.  Skin: Negative.  Negative for rash.  Neurological: Negative.  Negative for dizziness, tingling, focal weakness, seizures, loss of consciousness and headaches.  Endo/Heme/Allergies: Negative.  Negative for environmental allergies.  Psychiatric/Behavioral: Negative for depression, suicidal ideas, hallucinations and memory loss. The patient is nervous/anxious. The patient does not have insomnia.     Blood pressure 108/73, pulse 72, temperature 97.9 F (36.6 C), temperature source Oral, resp. rate 20, height 4\' 11"  (1.499 m), weight 65.318 kg (144 lb).Body mass index is 29.07 kg/(m^2).  General Appearance: Fairly Groomed  Patent attorney::  Good  Speech:  Normal in volume, rate and tone   Volume:  Normal  Mood:   Okay   Affect:    Bright,   Thought Process:   Circumstantial, organized   Orientation:   To place and person   Thought Content:  Patient continues to have some delusions but is no longer paranoid and is no longer hallucinating   Suicidal Thoughts:  No   Homicidal Thoughts:  No  Memory:  Immediate;   Poor Recent;   Poor Remote;   Poor  Judgement:   Impaired  Insight:  Lacking  Psychomotor Activity:  Normal  Concentration:  Fair  Recall:  [employer] of Knowledge:Fair  Language: Fair  Akathisia:  No  Handed:  Right  AIMS (if indicated):     Assets:  Desire for Improvement  ADL's:  Impaired  Cognition: Impaired,  Mild  Sleep:  Number of Hours: 6.75     Current Medications: Current Facility-Administered Medications  Medication Dose Route Frequency Provider Last Rate Last Dose  . albuterol (PROVENTIL HFA;VENTOLIN HFA) 108 (90 BASE) MCG/ACT inhaler 2 puff  2 puff Inhalation Q4H PRN Earney Navy, NP   2 puff at 11/01/14 0846  . beclomethasone (QVAR) 80 MCG/ACT inhaler 1 puff  1 puff Inhalation BID Jomarie Longs, MD   1 puff at 11/05/14 0800  . benztropine (COGENTIN) tablet 0.5 mg  0.5 mg Oral BID Earney Navy, NP   0.5 mg at 11/05/14 0800  . Darunavir Ethanolate (PREZISTA)  tablet 800 mg  800 mg Oral Q breakfast Earney Navy, NP   800 mg at 11/05/14 0800  . emtricitabine-tenofovir (TRUVADA) 200-300 MG per tablet 1 tablet  1 tablet Oral Daily Earney Navy, NP   1 tablet at 11/05/14 0800  . famotidine (PEPCID) tablet 20 mg  20 mg Oral BID Earney Navy, NP   20 mg at 11/05/14 0800  . gabapentin (NEURONTIN) capsule 300 mg  300 mg Oral BID Mojeed Akintayo   300 mg at 11/05/14 0800  . hydrOXYzine (ATARAX/VISTARIL) tablet 25 mg  25 mg Oral Q12H PRN Mojeed Akintayo   25 mg at 11/05/14 1134  . LORazepam (ATIVAN) tablet 1 mg  1 mg Oral BID Mojeed Akintayo   1 mg at 11/05/14 0800  . OLANZapine zydis (ZYPREXA) disintegrating tablet 5 mg  5 mg Oral Q6H PRN Adonis Brook, NP   5 mg at 11/03/14 1548  . ondansetron (ZOFRAN) tablet 4 mg  4 mg Oral Q8H PRN Worthy Flank, NP   4 mg at 11/04/14 2243  . raltegravir (ISENTRESS) tablet 400 mg  400 mg Oral BID Earney Navy, NP   400 mg at 11/05/14 0800  . risperiDONE (RISPERDAL M-TABS) disintegrating tablet 1 mg  1 mg Oral QHS Nelly Rout, MD      . risperiDONE  microspheres (RISPERDAL CONSTA) injection 50 mg  50 mg Intramuscular Q14 Days Nelly Rout, MD      . ritonavir (NORVIR) tablet 100 mg  100 mg Oral Q breakfast Earney Navy, NP   100 mg at 11/05/14 0800  . traZODone (DESYREL) tablet 50 mg  50 mg Oral QHS Mojeed Akintayo   50 mg at 11/04/14 2124    Lab Results: No results found for this or any previous visit (from the past 48 hour(s)).  Physical Findings: AIMS: Facial and Oral Movements Muscles of Facial Expression: None, normal Lips and Perioral Area: None, normal Jaw: None, normal Tongue: None, normal,Extremity Movements Upper (arms, wrists, hands, fingers): None, normal Lower (legs, knees, ankles, toes): None, normal, Trunk Movements Neck, shoulders, hips: None, normal, Overall Severity Severity of abnormal movements (highest score from questions above): None, normal Incapacitation due to abnormal movements: None, normal Patient's awareness of abnormal movements (rate only patient's report): No Awareness, Dental Status Current problems with teeth and/or dentures?: No Does patient usually wear dentures?: No  CIWA:    COWS:      Assessment - patient seems to be doing better, will be switched back to Risperdal Consta and risperidone today .  Treatment Plan Summary: Daily contact with patient to assess and evaluate symptoms and progress in treatment, Medication management. Discontinue Haldol 5 mg twice daily and to start risperidone 1 mg at bedtime. Also patient to get her Risperdal Consta 50 mg IM shot today Continue Trazodone  Qhs for insomnia Continue Cogentin 0.5mg  bid for EPS  Continue Neurontin 300 mg twice daily for anxiety Continue Antiretroviral medications for HIV management Patient is on 1 to 1 observation for safety, due to concern about fall risk related to gait difficulties Patient's brother to visit patient this afternoon with possible discharge planning for tomorrow if patient tolerates the change in  medication well    Nelly Rout, MD 11/05/2014, 3:18 PM

## 2014-11-05 NOTE — Progress Notes (Signed)
Patient ID: [name], female   DOB: Mar 07, 1961, 54 y.o.   MRN: [id number]  D: Patient pleasant on approach this am. Hyperverbal and hypomanic but cooperative at this time. Thinking still disorganized with some mild confusion. Conversation tangential but improved from last week. No aggressive behavior at this time. A: Staff will continue to monitor on 1:1 for safety due to psychosis and fall risk R: Cooperative at this time.

## 2014-11-05 NOTE — Progress Notes (Signed)
D: Patient pleasant but confused this afternoon and confabulating. She reports she had a good day. 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  A: Staff will continue to monitor on 1:1 for safety/fall risk R: Encouraged Ellasyn to focus on positive things in her life and her goals for discharge.

## 2014-11-05 NOTE — Progress Notes (Addendum)
1:1 NOTE  Pt at this time is sitting in wheelchair in dayroom. 1:1 staff also present with Pt in dayroom. Pt continue to be mildly confused. 15-minute safety checks also continues.

## 2014-11-05 NOTE — Progress Notes (Signed)
Patient ID: [name], female   DOB: January 28, 1961, 54 y.o.   MRN: [id number]  D: Patient has been calm most of the shift. Jumps out of wheelchair at times and needs some redirection. Patient"s mood has been pleasant with only a couple of agitation moments. A: Staff will monitor on q 15 minute checks for safety R: Cooperative with staff at present.

## 2014-11-05 NOTE — [facility] Group Notes (Signed)
[facility] Group Notes:  (Nursing/MHT/Case Management/Adjunct)  Date:  11/05/2014  Time:  0930  Type of Therapy:  Nurse Education  Participation Level:  Active  Participation Quality:  Appropriate and Attentive  Affect:  Excited  Cognitive:  Alert and Disorganized  Insight:  Lacking  Engagement in Group:  Engaged  Modes of Intervention:  Problem-solving and Support  Summary of Progress/Problems: Tangential but answering some questions appropriately  Andres Ege 11/05/2014, 3:06 PM

## 2014-11-05 NOTE — [facility] Group Notes (Signed)
[facility] Group Notes:  (Counselor/Nursing/MHT/Case Management/Adjunct)  11/05/2014 1:15PM  Type of Therapy:  Group Therapy  Participation Level:  Active  Participation Quality:  Appropriate  Affect:  Flat  Cognitive:  Oriented  Insight:  Improving  Engagement in Group:  Limited  Engagement in Therapy:  Limited  Modes of Intervention:  Discussion, Exploration and Socialization  Summary of Progress/Problems: The topic for group was balance in life.  Pt participated in the discussion about when their life was in balance and out of balance and how this feels.  Pt discussed ways to get back in balance and short term goals they can work on to get where they want to be. Still a bit pressured, and will talk as long as  I let her.  She is OK when I cut her off, and laughs when I joke with her about how she has a story for everything.  Continues to be better organized, less tangential, and more easily redirected.  She says she is balanced today, and states she gains balance by taking care of her hygiene ["I brush my hair and brush my teeth, but not with the same brush.  Then use mouthwash to make my breath minty fresh."]  And also by cooking and cleaning, because she loves to do that.   Ida Rogue 11/05/2014 4:33 PM

## 2014-11-05 NOTE — Progress Notes (Addendum)
D: Pt on wheelchair; on 1:1 continues to be disoriented to time, place, and situation. Pt lacks understanding of her physical and cognitive limitations. Pt able to understand simple direction; Pt can also be easily redirected. Pt denies any form of depression, SI, AVH, pain and anxiety. Pt however can be seen talking to individuals that are not there. Pt is a high fall risk. A: Pt continues to be on 1:1 for safety; 1:1 staff present with Pt at this time. Medications administered as prescribed.  Support, encouragement, and safe environment provided.  15-minute safety checks also continue. R: Pt was med compliant.  Safety checks continues

## 2014-11-05 NOTE — Plan of Care (Signed)
Problem: Alteration in thought process Goal: STG-Patient does not respond to command hallucinations Outcome: Progressing Patient having less response to auditory hallucinations; agitation improved

## 2014-11-05 NOTE — Progress Notes (Signed)
Patient ID: [name], female   DOB: September 12, 1960, 54 y.o.   MRN: [id number]  D: Patient has been pleasant this afternoon and talking about going home tomorrow. Risperdal Consta injection given in rt gluteal muscle. Needing some redirection when she impulsively jumps up out of wheelchair. A: Staff will continue to monitor on 1:1 for safety R: Taking meds without issue

## 2014-11-06 DIAGNOSIS — J45909 Unspecified asthma, uncomplicated: Secondary | ICD-10-CM | POA: Insufficient documentation

## 2014-11-06 DIAGNOSIS — B20 Human immunodeficiency virus [HIV] disease: Secondary | ICD-10-CM | POA: Insufficient documentation

## 2014-11-06 DIAGNOSIS — J449 Chronic obstructive pulmonary disease, unspecified: Secondary | ICD-10-CM | POA: Insufficient documentation

## 2014-11-06 MED ORDER — HYDROXYZINE HCL 25 MG PO TABS: 25.0000 mg | ORAL_TABLET | Freq: Two times a day (BID) | ORAL | Status: DC | PRN

## 2014-11-06 MED ORDER — DARUNAVIR ETHANOLATE 800 MG PO TABS: ORAL_TABLET | ORAL | Status: DC

## 2014-11-06 MED ORDER — BECLOMETHASONE DIPROPIONATE 80 MCG/ACT IN AERS: 1.0000 | INHALATION_SPRAY | Freq: Two times a day (BID) | RESPIRATORY_TRACT | Status: DC

## 2014-11-06 MED ORDER — RANITIDINE HCL 150 MG PO TABS: 150.0000 mg | ORAL_TABLET | Freq: Two times a day (BID) | ORAL | Status: DC

## 2014-11-06 MED ORDER — RISPERIDONE 1 MG PO TBDP: 1.0000 mg | ORAL_TABLET | Freq: Every day | ORAL | Status: DC

## 2014-11-06 MED ORDER — EMTRICITABINE-TENOFOVIR DF 200-300 MG PO TABS: 1.0000 | ORAL_TABLET | Freq: Every day | ORAL | Status: DC

## 2014-11-06 MED ORDER — POLYETHYLENE GLYCOL 3350 17 GM/SCOOP PO POWD: 17.0000 g | Freq: Every day | ORAL | Status: DC

## 2014-11-06 MED ORDER — GABAPENTIN 300 MG PO CAPS: 300.0000 mg | ORAL_CAPSULE | Freq: Two times a day (BID) | ORAL | Status: DC

## 2014-11-06 MED ORDER — RISPERIDONE 4 MG PO TBDP
4.0000 mg | ORAL_TABLET | Freq: Every day | ORAL | Status: DC
Start: 2014-11-06 — End: 2014-11-10

## 2014-11-06 MED ORDER — TRAZODONE HCL 50 MG PO TABS: 50.0000 mg | ORAL_TABLET | Freq: Every day | ORAL | Status: DC

## 2014-11-06 MED ORDER — RISPERIDONE 2 MG PO TBDP
4.0000 mg | ORAL_TABLET | Freq: Every day | ORAL | Status: DC
  Filled 2014-11-06: qty 6
  Filled 2014-11-06: qty 2

## 2014-11-06 MED ORDER — RITONAVIR 100 MG PO TABS: ORAL_TABLET | ORAL | Status: DC

## 2014-11-06 MED ORDER — RALTEGRAVIR POTASSIUM 400 MG PO TABS: ORAL_TABLET | ORAL | Status: DC

## 2014-11-06 MED ORDER — RISPERIDONE MICROSPHERES 50 MG IM SUSR: 50.0000 mg | INTRAMUSCULAR | Status: DC

## 2014-11-06 MED ORDER — ALBUTEROL SULFATE (2.5 MG/3ML) 0.083% IN NEBU: INHALATION_SOLUTION | RESPIRATORY_TRACT | Status: DC

## 2014-11-06 MED ORDER — ALBUTEROL SULFATE HFA 108 (90 BASE) MCG/ACT IN AERS: INHALATION_SPRAY | RESPIRATORY_TRACT | Status: DC

## 2014-11-06 MED ORDER — BENZTROPINE MESYLATE 0.5 MG PO TABS: 0.5000 mg | ORAL_TABLET | Freq: Two times a day (BID) | ORAL | Status: DC

## 2014-11-06 NOTE — Tx Team (Signed)
  Interdisciplinary Treatment Plan Update   Date Reviewed:  11/06/2014  Time Reviewed:  11:07 AM  Progress in Treatment:   Attending groups: Yes Participating in groups: Yes Taking medication as prescribed: Yes  Tolerating medication: Yes Family/Significant other contact made: Yes  Patient understands diagnosis: Yes  Discussing patient identified problems/goals with staff: Yes  See initial care plan Medical problems stabilized or resolved: Yes Denies suicidal/homicidal ideation: Yes  In tx team Patient has not harmed self or others: Yes  For review of initial/current patient goals, please see plan of care.  Estimated Length of Stay:  D/C today  Reason for Continuation of Hospitalization:   New Problems/Goals identified:  N/A  Discharge Plan or Barriers:   return home, follow up [facility]  Additional Comments:  Attendees:  Signature: Nelly Rout, MD 11/06/2014 11:07 AM   Signature: Richelle Ito, LCSW 11/06/2014 11:07 AM  Signature:  11/06/2014 11:07 AM  Signature:  11/06/2014 11:07 AM  Signature: Rodman Key, RN 11/06/2014 11:07 AM  Signature:  11/06/2014 11:07 AM  Signature:   11/06/2014 11:07 AM  Signature:    Signature:    Signature:    Signature:    Signature:    Signature:      Scribe for Treatment Team:   Richelle Ito, LCSW  11/06/2014 11:07 AM

## 2014-11-06 NOTE — Discharge Summary (Signed)
Physician Discharge Summary Note  Patient:  [name] is an 54 y.o., female MRN:  [id number] DOB:  12/05/1960 Patient phone:  [phone] (home)  Patient address:   [address] [place] [address],  Total Time spent with patient: 30 minutes  Date of Admission:  10/30/2014 Date of Discharge: 11/06/2014  Reason for Admission:    Principal Problem: Schizophrenia Discharge Diagnoses: Patient Active Problem List   Diagnosis Date Noted  . Asthma, chronic [J45.909]   . HIV disease [B20]   . 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 Paranoid schizophrenia [F20.0]   . Psychosis [F29] 10/30/2014  . Psychoses [F29]   . Hyponatremia [E87.1] 10/22/2014  . Microscopic hematuria [R31.2] 01/29/2014  . Abdominal pain [R10.9] 12/12/2013  . Vulvar irritation [N90.89] 12/12/2013  . COPD (chronic obstructive pulmonary disease) [J44.9] 02/18/2013  . Tobacco abuse [Z72.0] 02/18/2013  . Hepatitis B infection [B16.9]   . Pre-ulcerative corn or callous [L84] 05/07/2012  . Urinary incontinence, nocturnal enuresis [N39.44] 05/07/2012  . Fecal incontinence [R15.9] 05/07/2012  . Systolic murmur [R01.1] 05/07/2012  . Routine health maintenance [Z00.00] 05/07/2012  . Asthma [493] 08/18/2010  . HIV (human immunodeficiency virus infection) [Z21] 08/18/2010  . Schizophrenia [F20.9] 08/18/2010  . Herpes simplex infection [B00.9] 08/18/2010    Musculoskeletal: Strength & Muscle Tone: within normal limits Gait & Station: normal Patient leans: N/A  Psychiatric Specialty Exam: Physical Exam  Vitals reviewed. Psychiatric: Thought content is not paranoid. She does not exhibit a depressed mood. She expresses no homicidal and no suicidal ideation.    Review of Systems  All other systems reviewed and are negative.   Blood pressure 113/65, pulse 61, temperature 97.5 F (36.4 C), temperature source Oral, resp. rate 16, height 4\' 11"  (1.499 m), weight 65.318 kg (144  lb).Body mass index is 29.07 kg/(m^2).   General Appearance: Casual  Eye Contact:: Fair  Speech: mildly pressured  Volume: Normal  Mood: Euthymic  Affect: Congruent and Full Range  Thought Process: Coherent, Goal Directed and Intact  Orientation: Full (Time, Place, and Person)  Thought Content: WDL  Suicidal Thoughts: No  Homicidal Thoughts: No  Memory: Immediate; Fair Recent; Fair Remote; Fair  Judgement: Intact  Insight: Present  Psychomotor Activity: Normal  Concentration: Fair  Recall: [employer] of Knowledge:Fair  Language: Fair  Akathisia: No  Handed: Right  AIMS (if indicated):    Assets: Housing Social Support Transportation  Sleep: Number of Hours: 6  Cognition: WNL  ADL's: Intact       Have you used any form of tobacco in the last 30 days? (Cigarettes, Smokeless Tobacco, Cigars, and/or Pipes): Patient Refused Screening (pt confused)  Has this patient used any form of tobacco in the last 30 days? (Cigarettes, Smokeless Tobacco, Cigars, and/or Pipes) N/A  Past Medical History:  Past Medical History  Diagnosis Date  . GERD (gastroesophageal reflux disease)   . Schizophrenia   . AIDS 12-10-2007  . Herpes simplex   . History of cholecystectomy   . Cataract   . Nonspecific reaction to tuberculin skin test without active tuberculosis 11-2008 [facility]   . Seizures   . Asthmatic bronchitis , chronic   . Hepatitis B infection     followed by ID  . Chronic hyponatremia     Past Surgical History  Procedure Laterality Date  . Cholecystectomy    . Leg surgery      right leg, s/p accident   Family History:  Family History  Problem Relation Age of Onset  . Diabetes Sister   . Cancer Sister     breast  . Diabetes Brother   . Hypertension Brother   . Cancer Mother     lung  . Hypertension Mother   . Heart disease Father    Social History:  History  Alcohol Use No     History  Drug Use No    History    Social History  . Marital Status: Single    Spouse Name: N/A  . Number of Children: N/A  . Years of Education: N/A   Social History Main Topics  . Smoking status: Current Every Day Smoker -- 2.00 packs/day for 35 years    Types: Cigarettes    Start date: 04/10/1974  . Smokeless tobacco: Never Used     Comment: Previously smoked 2ppd, might start patch  . Alcohol Use: No  . Drug Use: No  . Sexual Activity: Not Currently     Comment: pt. declined condoms   Other Topics Concern  . None   Social History Narrative   Risk to Self: Is patient at risk for suicide?: No What has been your use of drugs/alcohol within the last 12 months?: Pt spoke about beer "3 a day maybe" (Sister reports pt does not consume alcohol or use drugs) Risk to Others:   Prior Inpatient Therapy:   Prior Outpatient Therapy:    Level of Care:  OP  Hospital Course:   [name], 54 y.o. female presented to [facility] accompanied by her brother.  Collateral information was provided by patient's brother.  [name]'s normal behavior had changed in the past 2 weeks.  Incidentally, her meds were changed in the same time frame.  Patient had been neglecting ADL's and hygiene.  He was worried about her safety especially when she began to endorse suicidal ideations.  [name] was admitted for Schizophrenia paranoid type and crisis management.  She was treated discharged with the medications listed below under Medication List.  Medical problems were identified and treated as needed.  Home medications were restarted as appropriate.  Improvement was monitored by observation and [name] report of symptom reduction.  Emotional and mental status was monitored by Hensley self-inventory reports completed by [name] and clinical staff.         [name] was evaluated by the treatment team for stability and plans for continued recovery upon discharge.  [name] motivation was an integral factor for scheduling  further treatment.  Employment, transportation, bed availability, health status, family support, and any pending legal issues were also considered during her hospital stay.  She was offered further treatment options upon discharge including but not limited to Residential, Intensive Outpatient, and Outpatient treatment.  Emiley [name] will follow up with the services as listed below under Follow Up Information.     Upon completion of this admission the patient was both mentally and medically stable for discharge denying suicidal/homicidal ideation, auditory/visual/tactile hallucinations, delusional thoughts and paranoia.      She feels she is doing better, wants to be able to go home. Patient adds that she plans to take her medications regularly, work with act team and adds that her brother visited yesterday and he was okay with her being discharged today.  Discussed with patient the need to be compliant with medications, work with ACT team and also work with a therapist in regards to her coping skills.  Consults:  psychiatry  Significant Diagnostic Studies:  labs: per ED  Discharge Vitals:   Blood pressure 113/65, pulse 61, temperature 97.5 F (36.4 C), temperature source Oral, resp. rate 16, height 4\' 11"  (1.499 m), weight 65.318 kg (144 lb). Body mass index is 29.07 kg/(m^2). Lab Results:   No results found for this or any previous visit (from the past 72 hour(s)).  Physical Findings: AIMS: Facial and Oral Movements Muscles of Facial Expression: None, normal Lips and Perioral Area: None, normal Jaw: None, normal Tongue: None, normal,Extremity Movements Upper (arms, wrists, hands, fingers): None, normal Lower (legs, knees, ankles, toes): None, normal, Trunk Movements Neck, shoulders, hips: None, normal, Overall Severity Severity of abnormal movements (highest score from questions above): None, normal Incapacitation due to abnormal movements: None, normal Patient's awareness of abnormal  movements (rate only patient's report): No Awareness, Dental Status Current problems with teeth and/or dentures?: No Does patient usually wear dentures?: No  CIWA:    COWS:      See Psychiatric Specialty Exam and Suicide Risk Assessment completed by Attending Physician prior to discharge.  Discharge destination:  Home  Is patient on multiple antipsychotic therapies at discharge:  No   Has Patient had three or more failed trials of antipsychotic monotherapy by history:  No  Recommended Plan for Multiple Antipsychotic Therapies: NA     Medication List    STOP taking these medications        clonazePAM 1 MG tablet  Commonly known as:  KLONOPIN     guaiFENesin-dextromethorphan 100-10 MG/5ML syrup  Commonly known as:  ROBITUSSIN DM     haloperidol 5 MG tablet  Commonly known as:  HALDOL     nystatin 100000 UNIT/GM Powd     QUEtiapine 50 MG tablet  Commonly known as:  SEROQUEL     zolpidem 10 MG tablet  Commonly known as:  AMBIEN      TAKE these medications      Indication   albuterol 108 (90 BASE) MCG/ACT inhaler  Commonly known as:  PROAIR HFA  INHALE TWO   PUFFS INTO LUNGS EVERY 6 HOURS AS NEEDED FOR SHORTNESS OF BREATH   Indication:  Chronic Obstructive Lung Disease     albuterol (2.5 MG/3ML) 0.083% nebulizer solution  Commonly known as:  PROVENTIL  INHALE CONTENTS OF 1 VIAL USING NEBULIZER EVERY 6 HOURS AS NEEDED FOR WHEEZING   Indication:  Acute Bronchospasm     beclomethasone 80 MCG/ACT inhaler  Commonly known as:  QVAR  Inhale 1 puff into the lungs 2 (two) times Hensley.   Indication:  Asthma     benztropine 0.5 MG tablet  Commonly known as:  COGENTIN  Take 1 tablet (0.5 mg total) by mouth 2 (two) times Hensley.   Indication:  Extrapyramidal Reaction caused by Medications     Darunavir Ethanolate 800 MG tablet  Commonly known as:  PREZISTA  TAKE 1 TABLET BY MOUTH ONCE Hensley WITH BREAKFAST.  TAKE WITH NORVIR.   Indication:  HIV Disease      emtricitabine-tenofovir 200-300 MG per tablet  Commonly known as:  TRUVADA  Take 1 tablet by mouth Hensley.   Indication:  HIV Disease     gabapentin 300 MG capsule  Commonly known as:  NEURONTIN  Take 1 capsule (300 mg total) by mouth 2 (two) times Hensley.   Indication:  Agitation, Neuropathic Pain     hydrOXYzine 25 MG tablet  Commonly known as:  ATARAX/VISTARIL  Take 1 tablet (25 mg total) by mouth every 12 (twelve)  hours as needed for anxiety (Sleep).   Indication:  Anxiety Neurosis     polyethylene glycol powder powder  Commonly known as:  GLYCOLAX/MIRALAX  Take 17 g by mouth Hensley.   Indication:  Treatment for the Prevention of Constipation     raltegravir 400 MG tablet  Commonly known as:  ISENTRESS  TAKE ONE TABLET   BY MOUTH   TWICE A DAY   Indication:  HIV Disease     ranitidine 150 MG tablet  Commonly known as:  ZANTAC  Take 1 tablet (150 mg total) by mouth 2 (two) times Hensley.   Indication:  Gastroesophageal Reflux Disease     risperiDONE 1 MG disintegrating tablet  Commonly known as:  RISPERDAL M-TABS  Take 1 tablet (1 mg total) by mouth at bedtime.   Indication:  Mood Stabilization     risperiDONE microspheres 50 MG injection  Commonly known as:  RISPERDAL CONSTA  Inject 2 mLs (50 mg total) into the muscle every 14 (fourteen) days. Last dose was given on 11/05/2014.  Next dose 11/19/2014   Indication:  Mood Stabilization     ritonavir 100 MG Tabs tablet  Commonly known as:  NORVIR  TAKE 1 TABLET BY MOUTH ONCE Hensley WITH PREZISTA   Indication:  HIV Disease     traZODone 50 MG tablet  Commonly known as:  DESYREL  Take 1 tablet (50 mg total) by mouth at bedtime.   Indication:  Trouble Sleeping           Follow-up Information    Follow up with [facility].   Why:  Someone from the ACT team will see you this afternoon.   Contact information:   [address]  [address]  [place]  [336] [phone]      Follow-up recommendations:  Activity:   as tol, diet as tol  Comments:  1.  Take all your medications as prescribed.  Risperdal 4 mg QHS for mood stabilization            2.  Report any adverse side effects to outpatient provider.                       3.  Patient instructed to not use alcohol or illegal drugs while on prescription medicines.            4.  In the event of worsening symptoms, instructed patient to call 911, the crisis hotline or go to nearest emergency room for evaluation of symptoms.             Total Discharge Time: 40 min  Signed: Velna Hatchet May Ason Heslin AGNP-BC 11/06/2014, 12:42 PM

## 2014-11-06 NOTE — Progress Notes (Signed)
  [facility] :  Will you be returning to the same living situation after discharge:  Yes,  home At discharge, do you have transportation home?: Yes,  family Do you have the ability to pay for your medications: Yes,  MCD  Release of information consent forms completed and in the chart;  Patient's signature needed at discharge.  Patient to Follow up at: Follow-up Information    Follow up with [facility].   Why:  Someone from the ACT team will see you this afternoon.   Contact information:   [address]  [address]  [place]  [336] [phone]      Patient denies SI/HI: Yes,  yes    Aeronautical engineer and Suicide Prevention discussed: Yes,  yes  Have you used any form of tobacco in the last 30 days? (Cigarettes, Smokeless Tobacco, Cigars, and/or Pipes): Patient Refused Screening (pt confused)  Has patient been referred to the Quitline?: Patient refused referral  Ida Rogue 11/06/2014, 11:15 AM

## 2014-11-06 NOTE — Progress Notes (Signed)
D: Patient with significant nausea and some abdominal pain. She was given Zofran however, she vomited approximately 15-20 minutes after administration. She stated she felt better after throwing up. 30 minutes later patient was noted to be sound asleep in bed.  A: Continues with 1:1 sitter.  R: Will continue to monitor for safety and medication effectiveness.

## 2014-11-06 NOTE — Progress Notes (Signed)
D: Patient awake and in milieu having vital signs performed. She is talkative, pleasant and still confabulating. She had some significant coughing and gagging this morning but not vomiting.  A: Continue with 1:1 sitter due to fall risk. R: Patient monitored for safety throughout the night.

## 2014-11-06 NOTE — Progress Notes (Signed)
D: Patient in room sound asleep. No additional episodes of nausea of vomiting at this time. A:  Sitter continues 1:1 due to fall risk.  R: Will continue to monitor and assess for safety.

## 2014-11-06 NOTE — Progress Notes (Signed)
Pt attended karaoke group this evening with sitter.

## 2014-11-06 NOTE — [facility] Suicide Risk Assessment (Signed)
[facility] INPATIENT:  Family/Significant Other Suicide Prevention Education  Suicide Prevention Education:  Education Completed; No one has been identified by the patient as the family member/significant other with whom the patient will be residing, and identified as the person(s) who will aid the patient in the event of a mental health crisis (suicidal ideations/suicide attempt).  With written consent from the patient, the family member/significant other has been provided the following suicide prevention education, prior to the and/or following the discharge of the patient.  The suicide prevention education provided includes the following:  Suicide risk factors  Suicide prevention and interventions  National Suicide Hotline telephone number  [facility] assessment telephone number  [facility] Emergency Assistance 911  [facility] and/or Residential Mobile Crisis Unit telephone number  Request made of family/significant other to:  Remove weapons (e.g., guns, rifles, knives), all items previously/currently identified as safety concern.    Remove drugs/medications (over-the-counter, prescriptions, illicit drugs), all items previously/currently identified as a safety concern.  The family member/significant other verbalizes understanding of the suicide prevention education information provided.  The family member/significant other agrees to remove the items of safety concern listed above.  The patient did not endorse SI at the time of admission, nor did the patient c/o SI during the stay here.  SPE not required.  However, I did talk to both brother and sister about crises planning.  Daryel Gerald B 11/06/2014, 11:16 AM

## 2014-11-06 NOTE — [facility] Suicide Risk Assessment (Signed)
[facility] Discharge Suicide Risk Assessment  Patient is a 54 year old diagnosed schizophrenia, paranoid type who reports today  that she feels she is doing better, wants to be able to go home. Patient adds that she plans to take her medications regularly, work with act team and adds that her brother visited yesterday and he was okay with her being discharged today.  Discussed with patient the need to be compliant with medications, work with act team and also work with a therapist in regards to her coping skills. Demographic Factors:  Low socioeconomic status  Total Time spent with patient: 30 minutes  Musculoskeletal: Strength & Muscle Tone: within normal limits Gait & Station: normal Patient leans: N/A  Psychiatric Specialty Exam: Physical Exam  Review of Systems  Constitutional: Negative.  Negative for fever.  HENT: Negative.  Negative for congestion, hearing loss and sore throat.   Eyes: Negative.  Negative for blurred vision, double vision and redness.  Respiratory: Negative.  Negative for cough and wheezing.   Cardiovascular: Negative.  Negative for chest pain, palpitations and PND.  Gastrointestinal: Negative.  Negative for heartburn, nausea, vomiting, constipation and melena.  Genitourinary: Negative.  Negative for dysuria and urgency.  Musculoskeletal: Positive for myalgias and falls. Negative for back pain and neck pain.  Skin: Negative.  Negative for rash.  Neurological: Negative.  Negative for dizziness, seizures, loss of consciousness and headaches.  Endo/Heme/Allergies: Negative.  Negative for environmental allergies.  Psychiatric/Behavioral: Negative.  Negative for depression, suicidal ideas, hallucinations, memory loss and substance abuse. The patient is not nervous/anxious and does not have insomnia.     Blood pressure 113/65, pulse 61, temperature 97.5 F (36.4 C), temperature source Oral, resp. rate 16, height 4\' 11"  (1.499 m), weight 65.318 kg (144 lb).Body mass index is  29.07 kg/(m^2).  General Appearance: Casual  Eye Contact::  Fair  Speech:  mildly pressured  Volume:  Normal  Mood:  Euthymic  Affect:  Congruent and Full Range  Thought Process:  Coherent, Goal Directed and Intact  Orientation:  Full (Time, Place, and Person)  Thought Content:  WDL  Suicidal Thoughts:  No  Homicidal Thoughts:  No  Memory:  Immediate;   Fair Recent;   Fair Remote;   Fair  Judgement:  Intact  Insight:  Present  Psychomotor Activity:  Normal  Concentration:  Fair  Recall:  [employer] of Knowledge:Fair  Language: Fair  Akathisia:  No  Handed:  Right  AIMS (if indicated):     Assets:  Housing Social Support Transportation  Sleep:  Number of Hours: 6  Cognition: WNL  ADL's:  Intact   Have you used any form of tobacco in the last 30 days? (Cigarettes, Smokeless Tobacco, Cigars, and/or Pipes): Patient Refused Screening (pt confused)  Has this patient used any form of tobacco in the last 30 days? (Cigarettes, Smokeless Tobacco, Cigars, and/or Pipes) Yes, Prescription not provided because: as patient refused  Mental Status Per Nursing Assessment::   On Admission:  NA  Current Mental Status by Physician: NA  Loss Factors: Decline in physical health  Historical Factors: Impulsivity  Risk Reduction Factors:   Religious beliefs about death, Living with another person, especially a relative and Positive social support  Continued Clinical Symptoms:  More than one psychiatric diagnosis Previous Psychiatric Diagnoses and Treatments Medical Diagnoses and Treatments/Surgeries  Cognitive Features That Contribute To Risk:  None    Suicide Risk:  Minimal: No identifiable suicidal ideation.  Patients presenting with no risk factors but with morbid ruminations;  may be classified as minimal risk based on the severity of the depressive symptoms  Principal Problem: Schizophrenia Discharge Diagnoses:  Patient Active Problem List   Diagnosis Date Noted  .  Schizophrenia [F20.9] 08/18/2010    Priority: High  . Paranoid schizophrenia [F20.0]   . Psychosis [F29] 10/30/2014  . Psychoses [F29]   . Hyponatremia [E87.1] 10/22/2014  . Microscopic hematuria [R31.2] 01/29/2014  . Abdominal pain [R10.9] 12/12/2013  . Vulvar irritation [N90.89] 12/12/2013  . COPD (chronic obstructive pulmonary disease) [J44.9] 02/18/2013  . Tobacco abuse [Z72.0] 02/18/2013  . Hepatitis B infection [B16.9]   . Pre-ulcerative corn or callous [L84] 05/07/2012  . Urinary incontinence, nocturnal enuresis [N39.44] 05/07/2012  . Fecal incontinence [R15.9] 05/07/2012  . Systolic murmur [R01.1] 05/07/2012  . Routine health maintenance [Z00.00] 05/07/2012  . Asthma [493] 08/18/2010  . HIV (human immunodeficiency virus infection) [Z21] 08/18/2010  . Herpes simplex infection [B00.9] 08/18/2010    Follow-up Information    Follow up with [facility].   Why:  Someone from the ACT team will see you this afternoon.   Contact information:   [address]  [address]  [place]  [phone]     Scheduled Meds: . beclomethasone  1 puff Inhalation BID  . benztropine  0.5 mg Oral BID  . Darunavir Ethanolate  800 mg Oral Q breakfast  . emtricitabine-tenofovir  1 tablet Oral Daily  . famotidine  20 mg Oral BID  . gabapentin  300 mg Oral BID  . LORazepam  1 mg Oral BID  . raltegravir  400 mg Oral BID  . risperiDONE  1 mg Oral QHS  . risperiDONE microspheres  50 mg Intramuscular Q14 Days  . ritonavir  100 mg Oral Q breakfast  . traZODone  50 mg Oral QHS   Continuous Infusions:  PRN Meds:.albuterol, hydrOXYzine, OLANZapine zydis, ondansetron  Plan Of Care/Follow-up recommendations:  Activity:  as tolerated Diet:  regular Other:  keep follow appointments  Is patient on multiple antipsychotic therapies at discharge:  No   Has Patient had three or more failed trials of antipsychotic monotherapy by history:  No  Recommended Plan for Multiple Antipsychotic  Therapies: NA Patient is being discharged on Risperdal Consta IM 50 mg and the Risperdal is being increased to 4 mg at bedtime.   Kesean Serviss 11/06/2014, 11:29 AM

## 2014-11-06 NOTE — Progress Notes (Addendum)
Pt is pleasant and cooperative and remains a 1:1 for safety. Pt was given  of zofran for nausea. He rates her anxiety a 10/10 and her depression a 0/10. Pt wants to continue to have positive thoughts . She also wants to go home and get along better with her family. Pt stated,"I want to stop acting stupid and doing the wrong things." pt is for discharge today. Presently she is in a wheelchair for safety. She has been attending the am group.Ptg does contract for safety. Pt will be discharged by 1pm  And family will pick her up and bring her a change of clothes. Pt appears very calm this am. 1:30p-pts brother came to pick her up. Pt was given back all of her medications and RX. NP spoke with family concerning the respiradol RX and DR Lucianne Muss to be contacted. Pt contracted for safety and denies Si and HI.

## 2014-11-08 ENCOUNTER — Encounter (HOSPITAL_COMMUNITY): Payer: Self-pay | Admitting: Emergency Medicine

## 2014-11-08 ENCOUNTER — Emergency Department (HOSPITAL_COMMUNITY)
Admission: EM | Admit: 2014-11-08 | Discharge: 2014-11-09 | Disposition: A | Discharge status: Other Acute Inpatient Hospital | Payer: Medicaid Other | Attending: Emergency Medicine | Admitting: Emergency Medicine

## 2014-11-08 DIAGNOSIS — J45909 Unspecified asthma, uncomplicated: Secondary | ICD-10-CM | POA: Insufficient documentation

## 2014-11-08 DIAGNOSIS — Z720 Tobacco use: Secondary | ICD-10-CM | POA: Diagnosis not present

## 2014-11-08 DIAGNOSIS — Z21 Asymptomatic human immunodeficiency virus [HIV] infection status: Secondary | ICD-10-CM | POA: Insufficient documentation

## 2014-11-08 DIAGNOSIS — Z79899 Other long term (current) drug therapy: Secondary | ICD-10-CM | POA: Insufficient documentation

## 2014-11-08 DIAGNOSIS — R45851 Suicidal ideations: Secondary | ICD-10-CM

## 2014-11-08 DIAGNOSIS — F22 Delusional disorders: Secondary | ICD-10-CM

## 2014-11-08 DIAGNOSIS — Z008 Encounter for other general examination: Secondary | ICD-10-CM | POA: Diagnosis present

## 2014-11-08 DIAGNOSIS — F209 Schizophrenia, unspecified: Secondary | ICD-10-CM | POA: Diagnosis not present

## 2014-11-08 DIAGNOSIS — Z8619 Personal history of other infectious and parasitic diseases: Secondary | ICD-10-CM | POA: Insufficient documentation

## 2014-11-08 DIAGNOSIS — F200 Paranoid schizophrenia: Secondary | ICD-10-CM | POA: Insufficient documentation

## 2014-11-08 DIAGNOSIS — K219 Gastro-esophageal reflux disease without esophagitis: Secondary | ICD-10-CM | POA: Diagnosis not present

## 2014-11-08 LAB — CBC WITH DIFFERENTIAL/PLATELET
Basophils Absolute: 0 10*3/uL (ref 0.0–0.1)
Basophils Relative: 0 % (ref 0–1)
Eosinophils Absolute: 0.2 10*3/uL (ref 0.0–0.7)
Eosinophils Relative: 3 % (ref 0–5)
HCT: 43.6 % (ref 36.0–46.0)
Hemoglobin: 14.6 g/dL (ref 12.0–15.0)
LYMPHS ABS: 3.2 10*3/uL (ref 0.7–4.0)
LYMPHS PCT: 43 % (ref 12–46)
MCH: 30.8 pg (ref 26.0–34.0)
MCHC: 33.5 g/dL (ref 30.0–36.0)
MCV: 92 fL (ref 78.0–100.0)
Monocytes Absolute: 0.4 10*3/uL (ref 0.1–1.0)
Monocytes Relative: 5 % (ref 3–12)
NEUTROS ABS: 3.6 10*3/uL (ref 1.7–7.7)
Neutrophils Relative %: 49 % (ref 43–77)
PLATELETS: 213 10*3/uL (ref 150–400)
RBC: 4.74 MIL/uL (ref 3.87–5.11)
RDW: 13.8 % (ref 11.5–15.5)
WBC: 7.3 10*3/uL (ref 4.0–10.5)

## 2014-11-08 LAB — BASIC METABOLIC PANEL
Anion gap: 8 (ref 5–15)
BUN: 15 mg/dL (ref 6–20)
CO2: 26 mmol/L (ref 22–32)
CREATININE: 1.13 mg/dL — AB (ref 0.44–1.00)
Calcium: 8.7 mg/dL — ABNORMAL LOW (ref 8.9–10.3)
Chloride: 101 mmol/L (ref 101–111)
GFR calc non Af Amer: 54 mL/min — ABNORMAL LOW (ref >60)
Glucose, Bld: 110 mg/dL — ABNORMAL HIGH (ref 65–99)
Potassium: 3.7 mmol/L (ref 3.5–5.1)
SODIUM: 135 mmol/L (ref 135–145)

## 2014-11-08 LAB — ETHANOL

## 2014-11-08 MED ORDER — RISPERIDONE 2 MG PO TBDP
4.0000 mg | ORAL_TABLET | Freq: Every day | ORAL | Status: DC
  Administered 2014-11-08: 4 mg via ORAL
  Filled 2014-11-08 (×2): qty 2

## 2014-11-08 MED ORDER — HYDROXYZINE HCL 25 MG PO TABS
25.0000 mg | ORAL_TABLET | Freq: Two times a day (BID) | ORAL | Status: DC | PRN
  Administered 2014-11-08: 25 mg via ORAL
  Filled 2014-11-08: qty 1

## 2014-11-08 MED ORDER — EMTRICITABINE-TENOFOVIR DF 200-300 MG PO TABS
1.0000 | ORAL_TABLET | Freq: Every day | ORAL | Status: DC
  Administered 2014-11-09: 1 via ORAL
  Filled 2014-11-08: qty 1

## 2014-11-08 MED ORDER — ACETAMINOPHEN 325 MG PO TABS: 650.0000 mg | ORAL_TABLET | ORAL | Status: DC | PRN

## 2014-11-08 MED ORDER — BECLOMETHASONE DIPROPIONATE 80 MCG/ACT IN AERS: 1.0000 | INHALATION_SPRAY | Freq: Two times a day (BID) | RESPIRATORY_TRACT | Status: DC

## 2014-11-08 MED ORDER — TRAZODONE HCL 50 MG PO TABS
50.0000 mg | ORAL_TABLET | Freq: Every day | ORAL | Status: DC
  Administered 2014-11-08: 50 mg via ORAL
  Filled 2014-11-08: qty 1

## 2014-11-08 MED ORDER — BENZTROPINE MESYLATE 1 MG PO TABS
0.5000 mg | ORAL_TABLET | Freq: Two times a day (BID) | ORAL | Status: DC
  Administered 2014-11-08 – 2014-11-09 (×2): 0.5 mg via ORAL
  Filled 2014-11-08 (×2): qty 1

## 2014-11-08 MED ORDER — ONDANSETRON HCL 4 MG PO TABS: 4.0000 mg | ORAL_TABLET | Freq: Three times a day (TID) | ORAL | Status: DC | PRN

## 2014-11-08 MED ORDER — FLUTICASONE PROPIONATE HFA 44 MCG/ACT IN AERO
1.0000 | INHALATION_SPRAY | Freq: Two times a day (BID) | RESPIRATORY_TRACT | Status: DC
  Administered 2014-11-08: 1 via RESPIRATORY_TRACT
  Filled 2014-11-08: qty 10.6

## 2014-11-08 MED ORDER — GABAPENTIN 300 MG PO CAPS
300.0000 mg | ORAL_CAPSULE | Freq: Two times a day (BID) | ORAL | Status: DC
  Administered 2014-11-08 – 2014-11-09 (×2): 300 mg via ORAL
  Filled 2014-11-08 (×2): qty 1

## 2014-11-08 MED ORDER — IBUPROFEN 200 MG PO TABS: 600.0000 mg | ORAL_TABLET | Freq: Three times a day (TID) | ORAL | Status: DC | PRN

## 2014-11-08 MED ORDER — DARUNAVIR ETHANOLATE 600 MG PO TABS
600.0000 mg | ORAL_TABLET | Freq: Two times a day (BID) | ORAL | Status: DC
  Filled 2014-11-08 (×3): qty 1

## 2014-11-08 MED ORDER — RALTEGRAVIR POTASSIUM 400 MG PO TABS
400.0000 mg | ORAL_TABLET | Freq: Two times a day (BID) | ORAL | Status: DC
  Administered 2014-11-08 – 2014-11-09 (×2): 400 mg via ORAL
  Filled 2014-11-08 (×3): qty 1

## 2014-11-08 MED ORDER — ZOLPIDEM TARTRATE 5 MG PO TABS: 5.0000 mg | ORAL_TABLET | Freq: Every evening | ORAL | Status: DC | PRN

## 2014-11-08 MED ORDER — ALUM & MAG HYDROXIDE-SIMETH 200-200-20 MG/5ML PO SUSP: 30.0000 mL | ORAL | Status: DC | PRN

## 2014-11-08 MED ORDER — NICOTINE 21 MG/24HR TD PT24
21.0000 mg | MEDICATED_PATCH | Freq: Every day | TRANSDERMAL | Status: DC | PRN
  Administered 2014-11-08: 21 mg via TRANSDERMAL
  Filled 2014-11-08: qty 1

## 2014-11-08 MED ORDER — FAMOTIDINE 20 MG PO TABS
20.0000 mg | ORAL_TABLET | Freq: Two times a day (BID) | ORAL | Status: DC
Start: 2014-11-08 — End: 2014-11-09
  Administered 2014-11-08 – 2014-11-09 (×2): 20 mg via ORAL
  Filled 2014-11-08 (×2): qty 1

## 2014-11-08 MED ORDER — RITONAVIR 100 MG PO TABS
100.0000 mg | ORAL_TABLET | Freq: Every day | ORAL | Status: DC
  Administered 2014-11-09: 100 mg via ORAL
  Filled 2014-11-08 (×2): qty 1

## 2014-11-08 NOTE — ED Notes (Signed)
Pt presents alert, disorganized with difficulty concentrating but easily is redirected. Pt lacks insight and memory is impaired. Will monitor closely and evaluate for stabilization.

## 2014-11-08 NOTE — ED Notes (Signed)
Bed: [facility] Expected date:  Expected time:  Means of arrival:  Comments: EMS-Medical Clearance

## 2014-11-08 NOTE — BH Assessment (Signed)
Tele Assessment Note   [name] is an 54 y.o. female who presents voluntarily to [facility] emergency department who exhibits slurred speech. Patient was observed to be a poor historian throughout the assessment and would use tangential speech AEB by stating how she lives with someone named [name] and [name] but would then shift the conversation, making no coherent sentences after that. Patient was recently discharged from [facility] last week with aftercare plan to follow up with her ACTT team [facility]. Patient was observed to exhibit a depressed mood as she would cry sporadically without reason. Patient has a history of suicide attempts but does deny HI/AVH at this time. Patient demonstrated restlessness throughout the assessment as she was unable to remain still, constantly shaking her left leg. Patient was solely oriented to person and place during the assessment. Patient denies substance abuse issues when questioned by Clinical research associate.   Axis I: Schizophrenia  Past Medical History:  Past Medical History  Diagnosis Date  . GERD (gastroesophageal reflux disease)   . Schizophrenia   . AIDS 12-10-2007  . Herpes simplex   . History of cholecystectomy   . Cataract   . Nonspecific reaction to tuberculin skin test without active tuberculosis 11-2008 [facility]   . Seizures   . Asthmatic bronchitis , chronic   . Hepatitis B infection     followed by ID  . Chronic hyponatremia     Past Surgical History  Procedure Laterality Date  . Cholecystectomy    . Leg surgery      right leg, s/p accident    Family History:  Family History  Problem Relation Age of Onset  . Diabetes Sister   . Cancer Sister     breast  . Diabetes Brother   . Hypertension Brother   . Cancer Mother     lung  . Hypertension Mother   . Heart disease Father     Social History:  reports that she has been smoking Cigarettes.  She started smoking about 40 years ago. She has a 70 pack-year smoking history. She has never used  smokeless tobacco. She reports that she does not drink alcohol or use illicit drugs.  Additional Social History:  Alcohol / Drug Use History of alcohol / drug use?: No history of alcohol / drug abuse  CIWA: CIWA-Ar BP: 108/76 mmHg Pulse Rate: 88 COWS:    PATIENT STRENGTHS: (choose at least two) Supportive family/friends  Allergies: No Known Allergies  Home Medications:  (Not in a hospital admission)  OB/GYN Status:  No LMP recorded. Patient is postmenopausal.  General Assessment Data Location of Assessment: [facility] ED TTS Assessment: In system Is this a Tele or Face-to-Face Assessment?: Tele Assessment Is this an Initial Assessment or a Re-assessment for this encounter?: Initial Assessment Marital status: Single Is patient pregnant?: No Pregnancy Status: No Living Arrangements: Other (Comment) Can pt return to current living arrangement?: Yes Admission Status: Voluntary Is patient capable of signing voluntary admission?: Yes Referral Source: Self/Family/Friend Insurance type: [facility]     Crisis Care Plan Living Arrangements: Other (Comment) Name of Psychiatrist: [facility] Name of Therapist: [facility]  Education Status Is patient currently in school?: No  Risk to self with the past 6 months Suicidal Ideation: Yes-Currently Present Has patient been a risk to self within the past 6 months prior to admission? : No Suicidal Intent: No-Not Currently/Within Last 6 Months Has patient had any suicidal intent within the past 6 months prior to admission? : No Is patient at  risk for suicide?: No Suicidal Plan?: No-Not Currently/Within Last 6 Months Has patient had any suicidal plan within the past 6 months prior to admission? : No Specify Current Suicidal Plan: None Access to Means: No Specify Access to Suicidal Means: N/A What has been your use of drugs/alcohol within the last 12 months?: Patient denies Previous Attempts/Gestures: No How many times?: 0 Other Self  Harm Risks: N/A Triggers for Past Attempts: Unknown, None known Intentional Self Injurious Behavior: None Family Suicide History: Unknown Persecutory voices/beliefs?: No Depression: Yes Depression Symptoms: Tearfulness Substance abuse history and/or treatment for substance abuse?: No  Risk to Others within the past 6 months Homicidal Ideation: No Does patient have any lifetime risk of violence toward others beyond the six months prior to admission? : No Thoughts of Harm to Others: No Current Homicidal Intent: No Current Homicidal Plan: No Access to Homicidal Means: No Identified Victim: None History of harm to others?: No Assessment of Violence: None Noted Violent Behavior Description: Patient is tearful  Does patient have access to weapons?: No Criminal Charges Pending?: No Does patient have a court date: No Is patient on probation?: Unknown  Psychosis Hallucinations: None noted Delusions: None noted  Mental Status Report Appearance/Hygiene: Disheveled Eye Contact: Fair Motor Activity: Restlessness Speech: Incoherent, Pressured, Word salad Level of Consciousness: Crying Mood: Depressed Affect: Depressed Anxiety Level: Minimal Thought Processes: Flight of Ideas, Tangential Judgement: Impaired Orientation: Person, Place Obsessive Compulsive Thoughts/Behaviors: None  Cognitive Functioning Concentration: Decreased Memory: Recent Impaired IQ: Average Insight: Poor Impulse Control: Poor Appetite: Fair Weight Loss: 0 Weight Gain: 0 Sleep: Unable to Assess  ADLScreening [facility] Assessment Services) Patient's cognitive ability adequate to safely complete daily activities?: Yes Patient able to express need for assistance with ADLs?: Yes Independently performs ADLs?: Yes (appropriate for developmental age)  Prior Inpatient Therapy Prior Inpatient Therapy: Yes Prior Therapy Dates: July 2016 Prior Therapy Facilty/Provider(s): [facility] Reason for Treatment: Med  Stabilization  Prior Outpatient Therapy Prior Outpatient Therapy: Yes Prior Therapy Dates: Current Prior Therapy Facilty/Provider(s): [facility] Reason for Treatment: MH Issues Does patient have an ACCT team?: Yes Does patient have Intensive In-House Services?  : No Does patient have [facility] services? : Unknown Does patient have P4CC services?: No  ADL Screening (condition at time of admission) Patient's cognitive ability adequate to safely complete daily activities?: Yes Is the patient deaf or have difficulty hearing?: No Does the patient have difficulty seeing, even when wearing glasses/contacts?: No Does the patient have difficulty concentrating, remembering, or making decisions?: No Patient able to express need for assistance with ADLs?: Yes Does the patient have difficulty dressing or bathing?: No Independently performs ADLs?: Yes (appropriate for developmental age) Does the patient have difficulty walking or climbing stairs?: No Weakness of Legs: None Weakness of Arms/Hands: None  Home Assistive Devices/Equipment Home Assistive Devices/Equipment: None  Therapy Consults (therapy consults require a physician order) PT Evaluation Needed: No OT Evalulation Needed: No SLP Evaluation Needed: No Abuse/Neglect Assessment (Assessment to be complete while patient is alone) Physical Abuse:  (UTA) Verbal Abuse:  (UTA) Sexual Abuse:  (UTA) Exploitation of patient/patient's resources:  (UTA) Self-Neglect:  (UTA) Values / Beliefs Cultural Requests During Hospitalization: None Spiritual Requests During Hospitalization: None Consults Spiritual Care Consult Needed: No Social Work Consult Needed: No Merchant navy officer (For Healthcare) Does patient have an advance directive?: No Would patient like information on creating an advanced directive?: No - patient declined information    Additional Information 1:1 In Past 12 Months?: No CIRT Risk: No Elopement Risk: No  Does patient have  medical clearance?: Yes     Disposition:  Disposition Initial Assessment Completed for this Encounter: Yes  Haskel Khan 11/08/2014 4:41 PM

## 2014-11-08 NOTE — ED Notes (Signed)
Per EMS, family of pt reports pt has had slurred speech, yelling, word salad x 3 weeks. No new symptoms today. Denies SI or HI. Hx of schizaffective disorder and schizophrenia.

## 2014-11-08 NOTE — ED Notes (Signed)
Lab draw unsuccessful/blood clotted

## 2014-11-08 NOTE — ED Notes (Signed)
Attempted to get urine sample however pt urinated in the cup but dumped it into the toilet before staff could obtain a sample.

## 2014-11-08 NOTE — ED Notes (Signed)
Bed: WTR5 Expected date:  Expected time:  Means of arrival:  Comments: Pt dressing

## 2014-11-08 NOTE — ED Notes (Signed)
Bed: ZOX09 Expected date:  Expected time:  Means of arrival:  Comments: Hold for Triage 5

## 2014-11-08 NOTE — ED Provider Notes (Signed)
CSN: [id number]     Arrival date & time 11/08/14  1520 History   First MD Initiated Contact with Patient 11/08/14 1550     Chief Complaint  Patient presents with  . Medical Clearance     (Consider location/radiation/quality/duration/timing/severity/associated sxs/prior Treatment) HPI   [name] is a 54 y.o. female who presents for evaluation of abnormal behavior. She presents by EMS, apparently they were called by family members. She states that she is trying to call someone on the TV channel selector. She states that, " Eddie Candle the [employer], called the ambulance." When asked, she states that she is suicidal, and has thought about taking pills. She denies homicidal ideation. She is unable to specify if she has seen a therapist recently, or taken her medications.   Level V Caveat- Altered Mental Status     Past Medical History  Diagnosis Date  . GERD (gastroesophageal reflux disease)   . Schizophrenia   . AIDS 12-10-2007  . Herpes simplex   . History of cholecystectomy   . Cataract   . Nonspecific reaction to tuberculin skin test without active tuberculosis 11-2008 [facility]   . Seizures   . Asthmatic bronchitis , chronic   . Hepatitis B infection     followed by ID  . Chronic hyponatremia    Past Surgical History  Procedure Laterality Date  . Cholecystectomy    . Leg surgery      right leg, s/p accident   Family History  Problem Relation Age of Onset  . Diabetes Sister   . Cancer Sister     breast  . Diabetes Brother   . Hypertension Brother   . Cancer Mother     lung  . Hypertension Mother   . Heart disease Father    History  Substance Use Topics  . Smoking status: Current Every Day Smoker -- 2.00 packs/day for 35 years    Types: Cigarettes    Start date: 04/10/1974  . Smokeless tobacco: Never Used     Comment: Previously smoked 2ppd, might start patch  . Alcohol Use: No     Comment: Patient denies    OB History    No data available     Review of  Systems  All other systems reviewed and are negative.     Allergies  Review of patient's allergies indicates no known allergies.  Home Medications   Prior to Admission medications   Medication Sig Start Date End Date Taking? Authorizing Provider  albuterol (PROAIR HFA) 108 (90 BASE) MCG/ACT inhaler INHALE TWO   PUFFS INTO LUNGS EVERY 6 HOURS AS NEEDED FOR SHORTNESS OF BREATH 11/06/14   Adonis Brook, NP  albuterol (PROVENTIL) (2.5 MG/3ML) 0.083% nebulizer solution INHALE CONTENTS OF 1 VIAL USING NEBULIZER EVERY 6 HOURS AS NEEDED FOR WHEEZING 11/06/14   Adonis Brook, NP  beclomethasone (QVAR) 80 MCG/ACT inhaler Inhale 1 puff into the lungs 2 (two) times daily. 11/06/14   Adonis Brook, NP  benztropine (COGENTIN) 0.5 MG tablet Take 1 tablet (0.5 mg total) by mouth 2 (two) times daily. 11/06/14   Adonis Brook, NP  Darunavir Ethanolate (PREZISTA) 800 MG tablet TAKE 1 TABLET BY MOUTH ONCE DAILY WITH BREAKFAST.  TAKE WITH NORVIR. 11/06/14   Adonis Brook, NP  emtricitabine-tenofovir (TRUVADA) 200-300 MG per tablet Take 1 tablet by mouth daily. 11/06/14   Adonis Brook, NP  gabapentin (NEURONTIN) 300 MG capsule Take 1 capsule (300 mg total) by mouth 2 (two) times daily. 11/06/14   Adonis Brook,  NP  hydrOXYzine (ATARAX/VISTARIL) 25 MG tablet Take 1 tablet (25 mg total) by mouth every 12 (twelve) hours as needed for anxiety (Sleep). 11/06/14   Adonis Brook, NP  polyethylene glycol powder (GLYCOLAX/MIRALAX) powder Take 17 g by mouth daily. 11/06/14   Adonis Brook, NP  raltegravir (ISENTRESS) 400 MG tablet TAKE ONE TABLET   BY MOUTH   TWICE A DAY 11/06/14   Adonis Brook, NP  ranitidine (ZANTAC) 150 MG tablet Take 1 tablet (150 mg total) by mouth 2 (two) times daily. 11/06/14   Adonis Brook, NP  risperiDONE (RISPERDAL M-TABS) 4 MG disintegrating tablet Take 1 tablet (4 mg total) by mouth at bedtime. 11/06/14   Adonis Brook, NP  risperiDONE microspheres (RISPERDAL CONSTA) 50 MG injection Inject 2  mLs (50 mg total) into the muscle every 14 (fourteen) days. Last dose was given on 11/05/2014.  Next dose 11/19/2014 11/06/14   Adonis Brook, NP  ritonavir (NORVIR) 100 MG TABS tablet TAKE 1 TABLET BY MOUTH ONCE DAILY WITH PREZISTA 11/06/14   Adonis Brook, NP  traZODone (DESYREL) 50 MG tablet Take 1 tablet (50 mg total) by mouth at bedtime. 11/06/14   Adonis Brook, NP   BP 108/76 mmHg  Pulse 88  Temp(Src) 98.7 F (37.1 C) (Oral)  Resp 18  SpO2 96% Physical Exam  Constitutional: She is oriented to person, place, and time. She appears well-developed and well-nourished.  HENT:  Head: Normocephalic and atraumatic.  Right Ear: External ear normal.  Left Ear: External ear normal.  Eyes: Conjunctivae and EOM are normal. Pupils are equal, round, and reactive to light.  Neck: Normal range of motion and phonation normal. Neck supple.  Cardiovascular: Normal rate, regular rhythm and normal heart sounds.   Pulmonary/Chest: Effort normal and breath sounds normal. She exhibits no bony tenderness.  Abdominal: Soft. There is no tenderness.  Musculoskeletal: Normal range of motion.  Neurological: She is alert and oriented to person, place, and time. No cranial nerve deficit or sensory deficit. She exhibits normal muscle tone. Coordination normal.  Mild speech impediment, which appears to be secondary to maxillofacial trauma, which was remote.  Skin: Skin is warm, dry and intact.  Psychiatric: She has a normal mood and affect.  She is cooperative, and answers questions. She is delusional.  Nursing note and vitals reviewed.   ED Course  Procedures (including critical care time) Medications  acetaminophen (TYLENOL) tablet 650 mg (not administered)  ibuprofen (ADVIL,MOTRIN) tablet 600 mg (not administered)  zolpidem (AMBIEN) tablet 5 mg (not administered)  nicotine (NICODERM CQ - dosed in mg/24 hours) patch 21 mg (21 mg Transdermal Patch Applied 11/08/14 1729)  ondansetron (ZOFRAN) tablet 4 mg (not  administered)  alum & mag hydroxide-simeth (MAALOX/MYLANTA) 200-200-20 MG/5ML suspension 30 mL (not administered)    Patient Vitals for the past 24 hrs:  BP Temp Temp src Pulse Resp SpO2  11/08/14 1533 108/76 mmHg 98.7 F (37.1 C) Oral 88 18 96 %    5:57 PM Reevaluation with update and discussion. After initial assessment and treatment, an updated evaluation reveals she has been evaluated by TTS, who plans on keeping her overnight to see the psychiatrist in the morning. Shanikka Wonders L    Labs Review Labs Reviewed  BASIC METABOLIC PANEL  ETHANOL  CBC WITH DIFFERENTIAL/PLATELET  URINALYSIS, ROUTINE W REFLEX MICROSCOPIC (NOT AT [facility])  URINE RAPID DRUG SCREEN, HOSP PERFORMED    Imaging Review No results found.   EKG Interpretation None      MDM   Final diagnoses:  Suicidal ideation  Delusional disorder    Psychiatric disease, with suicidal ideation and delusions.  Nursing Notes Reviewed/ Care Coordinated, and agree without changes. Applicable Imaging Reviewed.  Interpretation of Laboratory Data incorporated into ED treatment  Plan- as per TTS in conjunction with oncoming provider team    Mancel Bale, MD 11/08/14 1759

## 2014-11-08 NOTE — BH Assessment (Signed)
Per Conrad DNP, patient to be observed and further evaluated by psychiatry tomorrow.   Jackie Haire Pickett Jr. MSW, LCSW Therapeutic Triage Services-Triage Specialist     

## 2014-11-08 NOTE — ED Notes (Signed)
Patient seen pacing the unit, talking loud disrupting the milieu. Patient demanding to get her "Aids Medicine". The writer explain to the patient her medication schedule. Offered and accepted Vistaril 25 mg PO PRN for anxiety. Patient needs constant redirection as she kept coming to the nurse's station requesting her medicines. Will continue to monitor patient.

## 2014-11-09 ENCOUNTER — Inpatient Hospital Stay
Admission: EM | Admit: 2014-11-09 | Discharge: 2014-11-24 | DRG: 885 | Disposition: A | Discharge status: Home / Self Care | Payer: Medicaid Other | Source: Other Acute Inpatient Hospital | Attending: Psychiatry | Admitting: Psychiatry

## 2014-11-09 DIAGNOSIS — F17210 Nicotine dependence, cigarettes, uncomplicated: Secondary | ICD-10-CM | POA: Diagnosis present

## 2014-11-09 DIAGNOSIS — F0280 Dementia in other diseases classified elsewhere without behavioral disturbance: Secondary | ICD-10-CM | POA: Diagnosis present

## 2014-11-09 DIAGNOSIS — G4700 Insomnia, unspecified: Secondary | ICD-10-CM | POA: Diagnosis present

## 2014-11-09 DIAGNOSIS — R32 Unspecified urinary incontinence: Secondary | ICD-10-CM | POA: Diagnosis present

## 2014-11-09 DIAGNOSIS — R632 Polyphagia: Secondary | ICD-10-CM | POA: Diagnosis present

## 2014-11-09 DIAGNOSIS — K219 Gastro-esophageal reflux disease without esophagitis: Secondary | ICD-10-CM | POA: Diagnosis present

## 2014-11-09 DIAGNOSIS — Z7951 Long term (current) use of inhaled steroids: Secondary | ICD-10-CM

## 2014-11-09 DIAGNOSIS — R400 Somnolence: Secondary | ICD-10-CM | POA: Diagnosis present

## 2014-11-09 DIAGNOSIS — B20 Human immunodeficiency virus [HIV] disease: Secondary | ICD-10-CM | POA: Diagnosis present

## 2014-11-09 DIAGNOSIS — Z833 Family history of diabetes mellitus: Secondary | ICD-10-CM | POA: Diagnosis not present

## 2014-11-09 DIAGNOSIS — E871 Hypo-osmolality and hyponatremia: Secondary | ICD-10-CM | POA: Diagnosis present

## 2014-11-09 DIAGNOSIS — Z9049 Acquired absence of other specified parts of digestive tract: Secondary | ICD-10-CM | POA: Diagnosis present

## 2014-11-09 DIAGNOSIS — F200 Paranoid schizophrenia: Secondary | ICD-10-CM | POA: Diagnosis present

## 2014-11-09 DIAGNOSIS — Z803 Family history of malignant neoplasm of breast: Secondary | ICD-10-CM | POA: Diagnosis not present

## 2014-11-09 DIAGNOSIS — J449 Chronic obstructive pulmonary disease, unspecified: Secondary | ICD-10-CM | POA: Diagnosis present

## 2014-11-09 DIAGNOSIS — K5900 Constipation, unspecified: Secondary | ICD-10-CM | POA: Diagnosis present

## 2014-11-09 DIAGNOSIS — J45909 Unspecified asthma, uncomplicated: Secondary | ICD-10-CM | POA: Diagnosis present

## 2014-11-09 DIAGNOSIS — Z8249 Family history of ischemic heart disease and other diseases of the circulatory system: Secondary | ICD-10-CM

## 2014-11-09 DIAGNOSIS — Z79899 Other long term (current) drug therapy: Secondary | ICD-10-CM

## 2014-11-09 DIAGNOSIS — Z801 Family history of malignant neoplasm of trachea, bronchus and lung: Secondary | ICD-10-CM | POA: Diagnosis not present

## 2014-11-09 DIAGNOSIS — F22 Delusional disorders: Secondary | ICD-10-CM | POA: Insufficient documentation

## 2014-11-09 DIAGNOSIS — F17200 Nicotine dependence, unspecified, uncomplicated: Secondary | ICD-10-CM | POA: Diagnosis present

## 2014-11-09 MED ORDER — DARUNAVIR ETHANOLATE 800 MG PO TABS
800.0000 mg | ORAL_TABLET | Freq: Every day | ORAL | Status: DC
  Administered 2014-11-09: 800 mg via ORAL
  Filled 2014-11-09 (×2): qty 1

## 2014-11-09 MED ORDER — HYDROXYZINE HCL 25 MG PO TABS
25.0000 mg | ORAL_TABLET | Freq: Four times a day (QID) | ORAL | Status: DC | PRN
Start: 2014-11-09 — End: 2014-11-10
  Administered 2014-11-10 (×2): 25 mg via ORAL
  Filled 2014-11-09 (×2): qty 1

## 2014-11-09 MED ORDER — RALTEGRAVIR POTASSIUM 400 MG PO TABS
400.0000 mg | ORAL_TABLET | Freq: Two times a day (BID) | ORAL | Status: DC
  Administered 2014-11-09 – 2014-11-24 (×30): 400 mg via ORAL
  Filled 2014-11-09 (×32): qty 1

## 2014-11-09 MED ORDER — MAGNESIUM HYDROXIDE 400 MG/5ML PO SUSP
30.0000 mL | Freq: Every day | ORAL | Status: DC | PRN
Start: 2014-11-09 — End: 2014-11-24

## 2014-11-09 MED ORDER — TRAZODONE HCL 100 MG PO TABS
100.0000 mg | ORAL_TABLET | Freq: Every day | ORAL | Status: DC
  Administered 2014-11-09 – 2014-11-10 (×2): 100 mg via ORAL
  Filled 2014-11-09 (×2): qty 1

## 2014-11-09 MED ORDER — RISPERIDONE 1 MG PO TBDP
4.0000 mg | ORAL_TABLET | Freq: Every day | ORAL | Status: DC
  Administered 2014-11-09: 4 mg via ORAL
  Filled 2014-11-09: qty 4

## 2014-11-09 MED ORDER — LORAZEPAM 2 MG/ML IJ SOLN
1.0000 mg | Freq: Once | INTRAMUSCULAR | Status: AC
  Administered 2014-11-09: 1 mg via INTRAMUSCULAR
  Filled 2014-11-09: qty 1

## 2014-11-09 MED ORDER — ACETAMINOPHEN 325 MG PO TABS
650.0000 mg | ORAL_TABLET | Freq: Four times a day (QID) | ORAL | Status: DC | PRN
  Administered 2014-11-14 – 2014-11-18 (×3): 650 mg via ORAL
  Filled 2014-11-09 (×4): qty 2

## 2014-11-09 MED ORDER — BENZTROPINE MESYLATE 1 MG PO TABS
0.5000 mg | ORAL_TABLET | Freq: Two times a day (BID) | ORAL | Status: DC
  Administered 2014-11-09 – 2014-11-13 (×8): 0.5 mg via ORAL
  Filled 2014-11-09 (×8): qty 1

## 2014-11-09 MED ORDER — RISPERIDONE MICROSPHERES 25 MG IM SUSR: 50.0000 mg | INTRAMUSCULAR | Status: DC

## 2014-11-09 MED ORDER — EMTRICITABINE-TENOFOVIR DF 200-300 MG PO TABS
1.0000 | ORAL_TABLET | Freq: Every day | ORAL | Status: DC
  Administered 2014-11-09 – 2014-11-24 (×16): 1 via ORAL
  Filled 2014-11-09 (×16): qty 1

## 2014-11-09 MED ORDER — ALUM & MAG HYDROXIDE-SIMETH 200-200-20 MG/5ML PO SUSP
30.0000 mL | ORAL | Status: DC | PRN
  Filled 2014-11-09: qty 30

## 2014-11-09 MED ORDER — POLYETHYLENE GLYCOL 3350 17 G PO PACK
17.0000 g | PACK | Freq: Every day | ORAL | Status: DC
  Administered 2014-11-10 – 2014-11-16 (×5): 17 g via ORAL
  Filled 2014-11-09 (×7): qty 1

## 2014-11-09 MED ORDER — RITONAVIR 100 MG PO TABS
100.0000 mg | ORAL_TABLET | Freq: Every day | ORAL | Status: DC
  Administered 2014-11-10 – 2014-11-24 (×15): 100 mg via ORAL
  Filled 2014-11-09 (×16): qty 1

## 2014-11-09 MED ORDER — ALBUTEROL SULFATE HFA 108 (90 BASE) MCG/ACT IN AERS
2.0000 | INHALATION_SPRAY | RESPIRATORY_TRACT | Status: DC
  Administered 2014-11-10 – 2014-11-24 (×60): 2 via RESPIRATORY_TRACT
  Filled 2014-11-09 (×2): qty 6.7

## 2014-11-09 MED ORDER — ZIPRASIDONE MESYLATE 20 MG IM SOLR
10.0000 mg | Freq: Once | INTRAMUSCULAR | Status: AC
  Administered 2014-11-09: 10 mg via INTRAMUSCULAR
  Filled 2014-11-09: qty 20

## 2014-11-09 MED ORDER — DIPHENHYDRAMINE HCL 50 MG/ML IJ SOLN
50.0000 mg | Freq: Once | INTRAMUSCULAR | Status: AC
Start: 2014-11-09 — End: 2014-11-09
  Administered 2014-11-09: 50 mg via INTRAMUSCULAR
  Filled 2014-11-09: qty 1

## 2014-11-09 MED ORDER — DARUNAVIR ETHANOLATE 800 MG PO TABS
800.0000 mg | ORAL_TABLET | Freq: Every day | ORAL | Status: DC
  Administered 2014-11-10 – 2014-11-24 (×15): 800 mg via ORAL
  Filled 2014-11-09 (×16): qty 1

## 2014-11-09 MED ORDER — PANTOPRAZOLE SODIUM 40 MG PO TBEC
40.0000 mg | DELAYED_RELEASE_TABLET | Freq: Every day | ORAL | Status: DC
Start: 2014-11-09 — End: 2014-11-24
  Administered 2014-11-10 – 2014-11-24 (×15): 40 mg via ORAL
  Filled 2014-11-09 (×15): qty 1

## 2014-11-09 MED ORDER — BECLOMETHASONE DIPROPIONATE 80 MCG/ACT IN AERS
1.0000 | INHALATION_SPRAY | Freq: Two times a day (BID) | RESPIRATORY_TRACT | Status: DC
  Administered 2014-11-09: 1 via RESPIRATORY_TRACT
  Filled 2014-11-09: qty 8.7

## 2014-11-09 NOTE — ED Notes (Signed)
Call from brother, Adalae Baysinger.  His phone numbers are [phone] or [phone].  He wants to be notified if patient is going to be discharged or moved.  He states that their sister will be coming down from [place] state and will be pursuing guardianship on patient.

## 2014-11-09 NOTE — BH Assessment (Addendum)
[facility] Assessment Progress Note  Per Thedore Mins, MD, this pt requires psychiatric hospitalization at this time.  At 14:06 Claris Che at [facility] reports that pt has been accepted to their facility by Dr Jennet Maduro.  Nanine Means, NP concurs with this decision.  Pt is under IVC and is to be transported via [facility].  Pt's nurse, Dlugosz, has been notified and agrees to call report to [phone].   Doylene Canning, MA  Triage Specialist  [phone]

## 2014-11-09 NOTE — ED Notes (Signed)
[facility]  Left the unit ambulatory with [facility].  All belongings given to the transport team.

## 2014-11-09 NOTE — Consult Note (Signed)
[facility] Consult   Reason for Consult:  Psychosis Referring Physician:  EDP Patient Identification: [name] MRN:  [id number] Principal Diagnosis: Paranoid schizophrenia Diagnosis:   Patient Active Problem List   Diagnosis Date Noted  . Paranoid schizophrenia [F20.0]     Priority: High  . Asthma, chronic [J45.909]   . HIV disease [B20]   . 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 Psychosis [F29] 10/30/2014  . Psychoses [F29]   . Hyponatremia [E87.1] 10/22/2014  . Microscopic hematuria [R31.2] 01/29/2014  . Abdominal pain [R10.9] 12/12/2013  . Vulvar irritation [N90.89] 12/12/2013  . COPD (chronic obstructive pulmonary disease) [J44.9] 02/18/2013  . Tobacco abuse [Z72.0] 02/18/2013  . Hepatitis B infection [B16.9]   . Pre-ulcerative corn or callous [L84] 05/07/2012  . Urinary incontinence, nocturnal enuresis [N39.44] 05/07/2012  . Fecal incontinence [R15.9] 05/07/2012  . Systolic murmur [L07.8] 67/54/4920  . Routine health maintenance [Z00.00] 05/07/2012  . Asthma [493] 08/18/2010  . HIV (human immunodeficiency virus infection) [Z21] 08/18/2010  . Schizophrenia [F20.9] 08/18/2010  . Herpes simplex infection [B00.9] 08/18/2010    Total Time spent with patient: 45 minutes  Subjective:   [name] is a 54 y.o. female patient admitted with psychosis.  HPI:  54 y.o. female who presents voluntarily to [facility] emergency department who exhibits slurred speech. Patient was observed to be a poor historian throughout the assessment and would use tangential speech AEB by stating how she lives with someone named Seth Bake and Mitzi Hansen but would then shift the conversation, making no coherent sentences after that. Patient was recently discharged from [facility] last week with aftercare plan to follow up with her ACTT team [facility]. Patient was observed to exhibit a depressed mood as she would cry sporadically without reason. Patient has  a history of suicide attempts but does deny HI/AVH at this time. Patient demonstrated restlessness throughout the assessment as she was unable to remain still, constantly shaking her left leg. Patient was solely oriented to person and place during the assessment. Patient denies substance abuse issues when questioned by Probation officer.  Today:  Patient remains disorganized, agitated, confused, with minimal sleep (45 minutes). HPI Elements:   Location:  generalized. Quality:  acute. Severity:  severe. Timing:  constant. Duration:  few days. Context:  stressors.  Past Medical History:  Past Medical History  Diagnosis Date  . GERD (gastroesophageal reflux disease)   . Schizophrenia   . AIDS 12-10-2007  . Herpes simplex   . History of cholecystectomy   . Cataract   . Nonspecific reaction to tuberculin skin test without active tuberculosis 11-2008 [facility]   . Seizures   . Asthmatic bronchitis , chronic   . Hepatitis B infection     followed by ID  . Chronic hyponatremia     Past Surgical History  Procedure Laterality Date  . Cholecystectomy    . Leg surgery      right leg, s/p accident   Family History:  Family History  Problem Relation Age of Onset  . Diabetes Sister   . Cancer Sister     breast  . Diabetes Brother   . Hypertension Brother   . Cancer Mother     lung  . Hypertension Mother   . Heart disease Father    Social History:  History  Alcohol Use No    Comment: Patient denies      History  Drug Use No    Comment: Patient denies  History   Social History  . Marital Status: Single    Spouse Name: N/A  . Number of Children: N/A  . Years of Education: N/A   Social History Main Topics  . Smoking status: Current Every Day Smoker -- 2.00 packs/day for 35 years    Types: Cigarettes    Start date: 04/10/1974  . Smokeless tobacco: Never Used     Comment: Previously smoked 2ppd, might start patch  . Alcohol Use: No     Comment: Patient denies   . Drug Use: No      Comment: Patient denies  . Sexual Activity: Not Currently     Comment: pt. declined condoms   Other Topics Concern  . None   Social History Narrative   Additional Social History:    History of alcohol / drug use?: No history of alcohol / drug abuse                     Allergies:  No Known Allergies  Labs:  Results for orders placed or performed during the hospital encounter of 11/08/14 (from the past 48 hour(s))  Basic metabolic panel     Status: Abnormal   Collection Time: 11/08/14  3:20 PM  Result Value Ref Range   Sodium 135 135 - 145 mmol/L   Potassium 3.7 3.5 - 5.1 mmol/L   Chloride 101 101 - 111 mmol/L   CO2 26 22 - 32 mmol/L   Glucose, Bld 110 (H) 65 - 99 mg/dL   BUN 15 6 - 20 mg/dL   Creatinine, Ser 1.13 (H) 0.44 - 1.00 mg/dL   Calcium 8.7 (L) 8.9 - 10.3 mg/dL   GFR calc non Af Amer 54 (L) >60 mL/min   GFR calc Af Amer >60 >60 mL/min    Comment: (NOTE) The eGFR has been calculated using the CKD EPI equation. This calculation has not been validated in all clinical situations. eGFR's persistently <60 mL/min signify possible Chronic Kidney Disease.    Anion gap 8 5 - 15  CBC with Differential     Status: None   Collection Time: 11/08/14  3:20 PM  Result Value Ref Range   WBC 7.3 4.0 - 10.5 K/uL   RBC 4.74 3.87 - 5.11 MIL/uL   Hemoglobin 14.6 12.0 - 15.0 g/dL   HCT 43.6 36.0 - 46.0 %   MCV 92.0 78.0 - 100.0 fL   MCH 30.8 26.0 - 34.0 pg   MCHC 33.5 30.0 - 36.0 g/dL   RDW 13.8 11.5 - 15.5 %   Platelets 213 150 - 400 K/uL   Neutrophils Relative % 49 43 - 77 %   Neutro Abs 3.6 1.7 - 7.7 K/uL   Lymphocytes Relative 43 12 - 46 %   Lymphs Abs 3.2 0.7 - 4.0 K/uL   Monocytes Relative 5 3 - 12 %   Monocytes Absolute 0.4 0.1 - 1.0 K/uL   Eosinophils Relative 3 0 - 5 %   Eosinophils Absolute 0.2 0.0 - 0.7 K/uL   Basophils Relative 0 0 - 1 %   Basophils Absolute 0.0 0.0 - 0.1 K/uL  Ethanol     Status: None   Collection Time: 11/08/14  6:20 PM  Result Value  Ref Range   Alcohol, Ethyl (B) <5 <5 mg/dL    Comment:        LOWEST DETECTABLE LIMIT FOR SERUM ALCOHOL IS 5 mg/dL FOR MEDICAL PURPOSES ONLY     Vitals: Blood pressure 123/80, pulse 76,  temperature 98.3 F (36.8 C), temperature source Oral, resp. rate 16, SpO2 100 %.  Risk to Self: Suicidal Ideation: Yes-Currently Present Suicidal Intent: No-Not Currently/Within Last 6 Months Is patient at risk for suicide?: No Suicidal Plan?: No-Not Currently/Within Last 6 Months Specify Current Suicidal Plan: None Access to Means: No Specify Access to Suicidal Means: N/A What has been your use of drugs/alcohol within the last 12 months?: Patient denies How many times?: 0 Other Self Harm Risks: N/A Triggers for Past Attempts: Unknown, None known Intentional Self Injurious Behavior: None Risk to Others: Homicidal Ideation: No Thoughts of Harm to Others: No Current Homicidal Intent: No Current Homicidal Plan: No Access to Homicidal Means: No Identified Victim: None History of harm to others?: No Assessment of Violence: None Noted Violent Behavior Description: Patient is tearful  Does patient have access to weapons?: No Criminal Charges Pending?: No Does patient have a court date: No Prior Inpatient Therapy: Prior Inpatient Therapy: Yes Prior Therapy Dates: July 2016 Prior Therapy Facilty/Provider(s): [facility] Reason for Treatment: Med Stabilization Prior Outpatient Therapy: Prior Outpatient Therapy: Yes Prior Therapy Dates: Current Prior Therapy Facilty/Provider(s): [facility] Reason for Treatment: MH Issues Does patient have an ACCT team?: Yes Does patient have Intensive In-House Services?  : No Does patient have [facility] services? : Unknown Does patient have P4CC services?: No  Current Facility-Administered Medications  Medication Dose Route Frequency Provider Last Rate Last Dose  . acetaminophen (TYLENOL) tablet 650 mg  650 mg Oral Q4H PRN Daleen Bo, MD      . alum & mag  hydroxide-simeth (MAALOX/MYLANTA) 200-200-20 MG/5ML suspension 30 mL  30 mL Oral PRN Daleen Bo, MD      . beclomethasone (QVAR) 80 MCG/ACT inhaler 1 puff  1 puff Inhalation BID Daleen Bo, MD   1 puff at 11/09/14 1259  . benztropine (COGENTIN) tablet 0.5 mg  0.5 mg Oral BID Daleen Bo, MD   0.5 mg at 11/09/14 1035  . Darunavir Ethanolate (PREZISTA) tablet 800 mg  800 mg Oral Q breakfast Sherwood Gambler, MD   800 mg at 11/09/14 0824  . emtricitabine-tenofovir (TRUVADA) 200-300 MG per tablet 1 tablet  1 tablet Oral Daily Daleen Bo, MD   1 tablet at 11/09/14 1035  . famotidine (PEPCID) tablet 20 mg  20 mg Oral BID Daleen Bo, MD   20 mg at 11/09/14 1036  . gabapentin (NEURONTIN) capsule 300 mg  300 mg Oral BID Daleen Bo, MD   300 mg at 11/09/14 1035  . hydrOXYzine (ATARAX/VISTARIL) tablet 25 mg  25 mg Oral Q12H PRN Daleen Bo, MD   25 mg at 11/08/14 1927  . ibuprofen (ADVIL,MOTRIN) tablet 600 mg  600 mg Oral Q8H PRN Daleen Bo, MD      . nicotine (NICODERM CQ - dosed in mg/24 hours) patch 21 mg  21 mg Transdermal Daily PRN Daleen Bo, MD   21 mg at 11/08/14 1729  . ondansetron (ZOFRAN) tablet 4 mg  4 mg Oral Q8H PRN Daleen Bo, MD      . raltegravir (ISENTRESS) tablet 400 mg  400 mg Oral BID Daleen Bo, MD   400 mg at 11/09/14 1035  . risperiDONE (RISPERDAL M-TABS) disintegrating tablet 4 mg  4 mg Oral QHS Daleen Bo, MD   4 mg at 11/08/14 2328  . ritonavir (NORVIR) tablet 100 mg  100 mg Oral Q breakfast Daleen Bo, MD   100 mg at 11/09/14 0824  . traZODone (DESYREL) tablet 50 mg  50 mg Oral QHS Daleen Bo,  MD   50 mg at 11/08/14 2109  . zolpidem (AMBIEN) tablet 5 mg  5 mg Oral QHS PRN Daleen Bo, MD       Current Outpatient Prescriptions  Medication Sig Dispense Refill  . albuterol (PROAIR HFA) 108 (90 BASE) MCG/ACT inhaler INHALE TWO   PUFFS INTO LUNGS EVERY 6 HOURS AS NEEDED FOR SHORTNESS OF BREATH 1 Inhaler 5  . albuterol (PROVENTIL) (2.5 MG/3ML)  0.083% nebulizer solution INHALE CONTENTS OF 1 VIAL USING NEBULIZER EVERY 6 HOURS AS NEEDED FOR WHEEZING 75 mL 5  . beclomethasone (QVAR) 80 MCG/ACT inhaler Inhale 1 puff into the lungs 2 (two) times daily. 1 Inhaler 12  . benztropine (COGENTIN) 0.5 MG tablet Take 1 tablet (0.5 mg total) by mouth 2 (two) times daily. 60 tablet 0  . Darunavir Ethanolate (PREZISTA) 800 MG tablet TAKE 1 TABLET BY MOUTH ONCE DAILY WITH BREAKFAST.  TAKE WITH NORVIR. 30 tablet 5  . emtricitabine-tenofovir (TRUVADA) 200-300 MG per tablet Take 1 tablet by mouth daily. 30 tablet 5  . gabapentin (NEURONTIN) 300 MG capsule Take 1 capsule (300 mg total) by mouth 2 (two) times daily. 60 capsule 0  . hydrOXYzine (ATARAX/VISTARIL) 25 MG tablet Take 1 tablet (25 mg total) by mouth every 12 (twelve) hours as needed for anxiety (Sleep). 30 tablet 0  . QUEtiapine (SEROQUEL) 50 MG tablet Take 50 mg by mouth 3 (three) times daily.    . raltegravir (ISENTRESS) 400 MG tablet TAKE ONE TABLET   BY MOUTH   TWICE A DAY 60 tablet 5  . ranitidine (ZANTAC) 150 MG tablet Take 1 tablet (150 mg total) by mouth 2 (two) times daily. 60 tablet 5  . risperiDONE (RISPERDAL M-TABS) 4 MG disintegrating tablet Take 1 tablet (4 mg total) by mouth at bedtime. 30 tablet 0  . risperiDONE microspheres (RISPERDAL CONSTA) 50 MG injection Inject 2 mLs (50 mg total) into the muscle every 14 (fourteen) days. Last dose was given on 11/05/2014.  Next dose 11/19/2014 1 each 0  . ritonavir (NORVIR) 100 MG TABS tablet TAKE 1 TABLET BY MOUTH ONCE DAILY WITH PREZISTA 30 tablet 5  . traZODone (DESYREL) 50 MG tablet Take 1 tablet (50 mg total) by mouth at bedtime. 30 tablet 0  . zolpidem (AMBIEN) 10 MG tablet Take 10 mg by mouth at bedtime as needed for sleep.    . polyethylene glycol powder (GLYCOLAX/MIRALAX) powder Take 17 g by mouth daily. (Patient not taking: Reported on 11/09/2014) 500 g 1    Musculoskeletal: Strength & Muscle Tone: within normal limits Gait & Station:  normal Patient leans: N/A  Psychiatric Specialty Exam: Physical Exam  Review of Systems  Constitutional: Negative.   HENT: Negative.   Eyes: Negative.   Respiratory: Negative.   Cardiovascular: Negative.   Gastrointestinal: Negative.   Genitourinary: Negative.   Musculoskeletal: Negative.   Skin: Negative.   Endo/Heme/Allergies: Negative.   Psychiatric/Behavioral: Positive for hallucinations. The patient is nervous/anxious and has insomnia.     Blood pressure 123/80, pulse 76, temperature 98.3 F (36.8 C), temperature source Oral, resp. rate 16, SpO2 100 %.There is no weight on file to calculate BMI.  General Appearance: Disheveled  Eye Sport and exercise psychologist::  Fair  Speech:  Pressured  Volume:  Normal  Mood:  Anxious,labile  Affect:  Blunt  Thought Process:  Disorganized and Tangential  Orientation:  Other:  self  Thought Content:  Hallucinations: Auditory Visual  Suicidal Thoughts:  No  Homicidal Thoughts:  No  Memory:  Immediate;  Poor Recent;   Poor Remote;   Poor  Judgement:  Impaired  Insight:  Lacking  Psychomotor Activity:  Increased  Concentration:  Fair  Recall:  Poor  Fund of Knowledge:Fair  Language: Fair  Akathisia:  No  Handed:  Right  AIMS (if indicated):     Assets:  Leisure Time Physical Health Resilience  ADL's:  Intact  Cognition: WNL  Sleep:      Medical Decision Making: Review of Psycho-Social Stressors (1), Review or order clinical lab tests (1), Review of Medication Regimen & Side Effects (2) and Review of New Medication or Change in Dosage (2)  Treatment Plan Summary: Daily contact with patient to assess and evaluate symptoms and progress in treatment, Medication management and Plan transfer to [facility] for stabilization Paranoid Schizophrenia:  Restart home medications:  Risperdal 4 mg at bedtime for schizophrenia, Cogentin 0.5 mg BID for EPS prevention, Gabapentin 300 mg TID for mood stabilization Anxiety:  Vistaril 25 mg every 12 hours PRN  anxiety  Plan:  Recommend psychiatric Inpatient admission when medically cleared. Disposition: Admit to [facility] for stabilization per Dr. Lennox Solders, [place] 11/09/2014 4:21 PM Patient seen face-to-face for psychiatric evaluation, chart reviewed and case discussed with the physician extender and developed treatment plan. Reviewed the information documented and agree with the treatment plan. Corena Pilgrim, MD

## 2014-11-09 NOTE — Tx Team (Signed)
Initial Interdisciplinary Treatment Plan   PATIENT STRESSORS: Educational concerns Financial difficulties Health problems   PATIENT STRENGTHS: Active sense of humor Financial means Supportive family/friends   PROBLEM LIST: Problem List/Patient Goals Date to be /addressed Date deferred Reason deferred Estimated date of resolution  Psychosis 11/09/14     HIV  11/09/14     Hepatis B 11/09/14                                          DISCHARGE CRITERIA:  Adequate post-discharge living arrangements Medical problems require only outpatient monitoring  PRELIMINARY DISCHARGE PLAN: Outpatient therapy Return to previous living arrangement  PATIENT/FAMIILY INVOLVEMENT: This treatment plan has been presented to and reviewed with the patient, [name], and/or family member,  The patient and family have been given the opportunity to ask questions and make suggestions.  [name] A [name] 11/09/2014, 7:19 PM

## 2014-11-09 NOTE — ED Notes (Signed)
Report called to Toniann Fail, Charity fundraiser at [facility]  Sheriff notified of need for transportation.  They will be here around 17:00 or 18:00.

## 2014-11-10 DIAGNOSIS — F200 Paranoid schizophrenia: Principal | ICD-10-CM

## 2014-11-10 MED ORDER — OLANZAPINE 10 MG PO TABS
10.0000 mg | ORAL_TABLET | Freq: Once | ORAL | Status: AC
  Administered 2014-11-10: 10 mg via ORAL
  Filled 2014-11-10: qty 1

## 2014-11-10 MED ORDER — RISPERIDONE 1 MG PO TBDP
3.0000 mg | ORAL_TABLET | Freq: Two times a day (BID) | ORAL | Status: DC
  Administered 2014-11-10 – 2014-11-24 (×28): 3 mg via ORAL
  Filled 2014-11-10 (×28): qty 3

## 2014-11-10 MED ORDER — OLANZAPINE 10 MG PO TABS: 15.0000 mg | ORAL_TABLET | Freq: Once | ORAL | Status: DC

## 2014-11-10 MED ORDER — LORAZEPAM 2 MG PO TABS
2.0000 mg | ORAL_TABLET | ORAL | Status: DC | PRN
  Administered 2014-11-10 – 2014-11-22 (×13): 2 mg via ORAL
  Filled 2014-11-10 (×15): qty 1

## 2014-11-10 MED ORDER — DIVALPROEX SODIUM 500 MG PO DR TAB
500.0000 mg | DELAYED_RELEASE_TABLET | Freq: Three times a day (TID) | ORAL | Status: DC
  Administered 2014-11-10 – 2014-11-11 (×3): 500 mg via ORAL
  Filled 2014-11-10 (×4): qty 1

## 2014-11-10 MED ORDER — LORAZEPAM 2 MG PO TABS
2.0000 mg | ORAL_TABLET | Freq: Two times a day (BID) | ORAL | Status: DC
  Administered 2014-11-11 – 2014-11-13 (×5): 2 mg via ORAL
  Filled 2014-11-10 (×5): qty 1

## 2014-11-10 MED ORDER — LORAZEPAM 1 MG PO TABS
1.0000 mg | ORAL_TABLET | Freq: Three times a day (TID) | ORAL | Status: DC
  Administered 2014-11-10 (×2): 1 mg via ORAL
  Filled 2014-11-10 (×3): qty 1

## 2014-11-10 NOTE — H&P (Signed)
Psychiatric Admission Assessment Adult  Patient Identification: [name] MRN:  [id number] Date of Evaluation:  11/10/2014 Chief Complaint:  Bipolar Principal Diagnosis: Paranoid schizophrenia Diagnosis:   Patient Active Problem List   Diagnosis Date Noted  . Schizophrenia, paranoid type [F20.0] 11/09/2014  . 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 Asthma, chronic [J45.909]   . HIV disease [B20]   . 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 Paranoid schizophrenia [F20.0]   . Psychosis [F29] 10/30/2014  . Psychoses [F29]   . Hyponatremia [E87.1] 10/22/2014  . Microscopic hematuria [R31.2] 01/29/2014  . Abdominal pain [R10.9] 12/12/2013  . Vulvar irritation [N90.89] 12/12/2013  . COPD (chronic obstructive pulmonary disease) [J44.9] 02/18/2013  . Tobacco use disorder [Z72.0] 02/18/2013  . Hepatitis B infection [B16.9]   . Pre-ulcerative corn or callous [L84] 05/07/2012  . Urinary incontinence, nocturnal enuresis [N39.44] 05/07/2012  . Fecal incontinence [R15.9] 05/07/2012  . Systolic murmur [E72.0] 94/70/9628  . Routine health maintenance [Z00.00] 05/07/2012  . Asthma [493] 08/18/2010  . HIV (human immunodeficiency virus infection) [Z21] 08/18/2010  . Schizophrenia [F20.9] 08/18/2010  . Herpes simplex infection [B00.9] 08/18/2010   History of Present Illness::   Identifying data. [name] is a 54 year old female with a history of schizoaffective disorder.  Chief complaint. "I put a job application in."  History of present illness. [name] has a long history of schizoaffective disorder. She is in the care of [facility]. 3 weeks ago Neurontin was added to her medication and the patient's brother felt that it changed patient's behavior. He became agitated, intrusive, disorganized, unable to care for herself, and suicide. She was briefly hospitalized at [facility],. She returns to the hospital in apparently manic episode. I cannot obtain any  information from the patient it is mostly obtained from the chart we were unable to contact her brother as a few. It is unclear if the patient has been compliant with her oral medication but she is maintained on Risperdal Consta injection. It is difficult to know whether that the patient has exacerbation of bipolar illness or if Risperdal Consta has not been adequate treatment. In my opinion it is not advisable medication. In addition her oral Risperdal was lowered to 1 mg a night. It is quite possible that the patient didn't take any. It is worrisome because she has HIV infection and needs to stay on her medication regimen. She is completely disorganized and unable to hold a conversation she is oriented to person and year only. She believes that she is been applying for a job here. She wants to retire to 76. She is requesting a piata for a birthday party with [employer]. She is disorganized, pleasantly confused and intrusive in groups. She denies thoughts of hurting herself or others. She denies symptoms of depression or anxiety. She denies alcohol or substance use.  Past psychiatric history.  I see one prior hospitalization at [facility]. As she still has active I imagine that she's been sick for a while. The patient is unable to provide any information about her past medications. She denies ever attempting suicide.  Family psychiatric history. Unknown.  Social history. Also unknown she is disabled from mental illness she has a brother who is supportive and seems to be aware of the patient's situation however I have not been able to reach him over the phone.      Total Time spent with patient: 1 hour  Past Medical History:  Past Medical History  Diagnosis Date  . GERD (gastroesophageal reflux disease)   . Schizophrenia   . AIDS 12-10-2007  . Herpes simplex   . History of cholecystectomy   . Cataract   . Nonspecific reaction to tuberculin skin test without active tuberculosis 11-2008 [facility]    . Seizures   . Asthmatic bronchitis , chronic   . Hepatitis B infection     followed by ID  . Chronic hyponatremia     Past Surgical History  Procedure Laterality Date  . Cholecystectomy    . Leg surgery      right leg, s/p accident   Family History:  Family History  Problem Relation Age of Onset  . Diabetes Sister   . Cancer Sister     breast  . Diabetes Brother   . Hypertension Brother   . Cancer Mother     lung  . Hypertension Mother   . Heart disease Father    Social History:  History  Alcohol Use No    Comment: Patient denies      History  Drug Use No    Comment: Patient denies    History   Social History  . Marital Status: Single    Spouse Name: N/A  . Number of Children: N/A  . Years of Education: N/A   Social History Main Topics  . Smoking status: Current Every Day Smoker -- 2.00 packs/day for 35 years    Types: Cigarettes    Start date: 04/10/1974  . Smokeless tobacco: Never Used     Comment: Previously smoked 2ppd, might start patch  . Alcohol Use: No     Comment: Patient denies   . Drug Use: No     Comment: Patient denies  . Sexual Activity: Not Currently     Comment: pt. declined condoms   Other Topics Concern  . None   Social History Narrative   Additional Social History:                          Musculoskeletal: Strength & Muscle Tone: within normal limits Gait & Station: normal Patient leans: N/A  Psychiatric Specialty Exam: Physical Exam  Nursing note and vitals reviewed.   Review of Systems  All other systems reviewed and are negative.   Blood pressure 91/64, pulse 92, temperature 98 F (36.7 C), temperature source Oral, resp. rate 18, height 4' 11"  (1.499 m), weight 63.504 kg (140 lb), SpO2 98 %.Body mass index is 28.26 kg/(m^2).  See SRA.                                                  Sleep:  Number of Hours: 6   Risk to Self: Is patient at risk for suicide?: No Risk to Others:    Prior Inpatient Therapy:   Prior Outpatient Therapy:    Alcohol Screening: 1. How often do you have a drink containing alcohol?: Never 2. How many drinks containing alcohol do you have on a typical day when you are drinking?: 1 or 2 3. How often do you have six or more drinks on one occasion?: Never Preliminary Score: 0 4. How often during the last year have you found that you were not able to stop drinking once you had started?: Never 5. How often during the last year have you failed to  do what was normally expected from you becasue of drinking?: Never 6. How often during the last year have you needed a first drink in the morning to get yourself going after a heavy drinking session?: Never 7. How often during the last year have you had a feeling of guilt of remorse after drinking?: Never 8. How often during the last year have you been unable to remember what happened the night before because you had been drinking?: Never 9. Have you or someone else been injured as a result of your drinking?: No 10. Has a relative or friend or a doctor or another health worker been concerned about your drinking or suggested you cut down?: No Alcohol Use Disorder Identification Test Final Score (AUDIT): 0 Brief Intervention: AUDIT score less than 7 or less-screening does not suggest unhealthy drinking-brief intervention not indicated  Allergies:  No Known Allergies Lab Results:  Results for orders placed or performed during the hospital encounter of 11/08/14 (from the past 48 hour(s))  Basic metabolic panel     Status: Abnormal   Collection Time: 11/08/14  3:20 PM  Result Value Ref Range   Sodium 135 135 - 145 mmol/L   Potassium 3.7 3.5 - 5.1 mmol/L   Chloride 101 101 - 111 mmol/L   CO2 26 22 - 32 mmol/L   Glucose, Bld 110 (H) 65 - 99 mg/dL   BUN 15 6 - 20 mg/dL   Creatinine, Ser 1.13 (H) 0.44 - 1.00 mg/dL   Calcium 8.7 (L) 8.9 - 10.3 mg/dL   GFR calc non Af Amer 54 (L) >60 mL/min   GFR calc Af Amer  >60 >60 mL/min    Comment: (NOTE) The eGFR has been calculated using the CKD EPI equation. This calculation has not been validated in all clinical situations. eGFR's persistently <60 mL/min signify possible Chronic Kidney Disease.    Anion gap 8 5 - 15  CBC with Differential     Status: None   Collection Time: 11/08/14  3:20 PM  Result Value Ref Range   WBC 7.3 4.0 - 10.5 K/uL   RBC 4.74 3.87 - 5.11 MIL/uL   Hemoglobin 14.6 12.0 - 15.0 g/dL   HCT 43.6 36.0 - 46.0 %   MCV 92.0 78.0 - 100.0 fL   MCH 30.8 26.0 - 34.0 pg   MCHC 33.5 30.0 - 36.0 g/dL   RDW 13.8 11.5 - 15.5 %   Platelets 213 150 - 400 K/uL   Neutrophils Relative % 49 43 - 77 %   Neutro Abs 3.6 1.7 - 7.7 K/uL   Lymphocytes Relative 43 12 - 46 %   Lymphs Abs 3.2 0.7 - 4.0 K/uL   Monocytes Relative 5 3 - 12 %   Monocytes Absolute 0.4 0.1 - 1.0 K/uL   Eosinophils Relative 3 0 - 5 %   Eosinophils Absolute 0.2 0.0 - 0.7 K/uL   Basophils Relative 0 0 - 1 %   Basophils Absolute 0.0 0.0 - 0.1 K/uL  Ethanol     Status: None   Collection Time: 11/08/14  6:20 PM  Result Value Ref Range   Alcohol, Ethyl (B) <5 <5 mg/dL    Comment:        LOWEST DETECTABLE LIMIT FOR SERUM ALCOHOL IS 5 mg/dL FOR MEDICAL PURPOSES ONLY    Current Medications: Current Facility-Administered Medications  Medication Dose Route Frequency Provider Last Rate Last Dose  . acetaminophen (TYLENOL) tablet 650 mg  650 mg Oral Q6H PRN Jolanta B Pucilowska,  MD      . albuterol (PROVENTIL HFA;VENTOLIN HFA) 108 (90 BASE) MCG/ACT inhaler 2 puff  2 puff Inhalation Q4H Jolanta B Pucilowska, MD   2 puff at 11/10/14 1248  . alum & mag hydroxide-simeth (MAALOX/MYLANTA) 200-200-20 MG/5ML suspension 30 mL  30 mL Oral Q4H PRN Jolanta B Pucilowska, MD      . benztropine (COGENTIN) tablet 0.5 mg  0.5 mg Oral BID [name], MD   0.5 mg at 11/10/14 0922  . Darunavir Ethanolate (PREZISTA) tablet 800 mg  800 mg Oral Q breakfast Jolanta B Pucilowska, MD   800 mg  at 11/10/14 0917  . emtricitabine-tenofovir (TRUVADA) 200-300 MG per tablet 1 tablet  1 tablet Oral Daily [name], MD   1 tablet at 11/10/14 0923  . hydrOXYzine (ATARAX/VISTARIL) tablet 25 mg  25 mg Oral Q6H PRN Jolanta B Pucilowska, MD      . magnesium hydroxide (MILK OF MAGNESIA) suspension 30 mL  30 mL Oral Daily PRN Jolanta B Pucilowska, MD      . pantoprazole (PROTONIX) EC tablet 40 mg  40 mg Oral Daily [name], MD   40 mg at 11/10/14 0922  . polyethylene glycol (MIRALAX / GLYCOLAX) packet 17 g  17 g Oral Daily [name], MD   17 g at 11/10/14 0922  . raltegravir (ISENTRESS) tablet 400 mg  400 mg Oral BID [name], MD   400 mg at 11/10/14 0921  . risperiDONE (RISPERDAL M-TABS) disintegrating tablet 4 mg  4 mg Oral QHS [name], MD   4 mg at 11/09/14 2157  . [START ON 11/19/2014] risperiDONE microspheres (RISPERDAL CONSTA) injection 50 mg  50 mg Intramuscular Q14 Days Jolanta B Pucilowska, MD      . ritonavir (NORVIR) tablet 100 mg  100 mg Oral Q breakfast Jolanta B Pucilowska, MD   100 mg at 11/10/14 0918  . traZODone (DESYREL) tablet 100 mg  100 mg Oral QHS [name], MD   100 mg at 11/09/14 2157   PTA Medications: Prescriptions prior to admission  Medication Sig Dispense Refill Last Dose  . beclomethasone (QVAR) 80 MCG/ACT inhaler Inhale 1 puff into the lungs 2 (two) times daily. 1 Inhaler 12 unknown  . Darunavir Ethanolate (PREZISTA) 800 MG tablet TAKE 1 TABLET BY MOUTH ONCE DAILY WITH BREAKFAST.  TAKE WITH NORVIR. 30 tablet 5 unknown  . emtricitabine-tenofovir (TRUVADA) 200-300 MG per tablet Take 1 tablet by mouth daily. 30 tablet 5 unknown  . raltegravir (ISENTRESS) 400 MG tablet TAKE ONE TABLET   BY MOUTH   TWICE A DAY 60 tablet 5 unknown  . albuterol (PROAIR HFA) 108 (90 BASE) MCG/ACT inhaler INHALE TWO   PUFFS INTO LUNGS EVERY 6 HOURS AS NEEDED FOR SHORTNESS OF BREATH 1 Inhaler 5 unknown  . albuterol (PROVENTIL)  (2.5 MG/3ML) 0.083% nebulizer solution INHALE CONTENTS OF 1 VIAL USING NEBULIZER EVERY 6 HOURS AS NEEDED FOR WHEEZING 75 mL 5 unknown  . benztropine (COGENTIN) 0.5 MG tablet Take 1 tablet (0.5 mg total) by mouth 2 (two) times daily. 60 tablet 0 unknown  . gabapentin (NEURONTIN) 300 MG capsule Take 1 capsule (300 mg total) by mouth 2 (two) times daily. 60 capsule 0 unknown  . hydrOXYzine (ATARAX/VISTARIL) 25 MG tablet Take 1 tablet (25 mg total) by mouth every 12 (twelve) hours as needed for anxiety (Sleep). 30 tablet 0 unknown  . polyethylene glycol powder (GLYCOLAX/MIRALAX) powder Take 17 g by mouth daily. (Patient not  taking: Reported on 11/09/2014) 500 g 1 Not Taking at Unknown time  . QUEtiapine (SEROQUEL) 50 MG tablet Take 50 mg by mouth 3 (three) times daily.   unknown  . ranitidine (ZANTAC) 150 MG tablet Take 1 tablet (150 mg total) by mouth 2 (two) times daily. 60 tablet 5 unknown  . risperiDONE (RISPERDAL M-TABS) 4 MG disintegrating tablet Take 1 tablet (4 mg total) by mouth at bedtime. 30 tablet 0 unknown  . risperiDONE microspheres (RISPERDAL CONSTA) 50 MG injection Inject 2 mLs (50 mg total) into the muscle every 14 (fourteen) days. Last dose was given on 11/05/2014.  Next dose 11/19/2014 1 each 0 unknown  . ritonavir (NORVIR) 100 MG TABS tablet TAKE 1 TABLET BY MOUTH ONCE DAILY WITH PREZISTA 30 tablet 5 unknown  . traZODone (DESYREL) 50 MG tablet Take 1 tablet (50 mg total) by mouth at bedtime. 30 tablet 0 unknown  . zolpidem (AMBIEN) 10 MG tablet Take 10 mg by mouth at bedtime as needed for sleep.   unknown    Previous Psychotropic Medications: Yes   Substance Abuse History in the last 12 months:  No.    Consequences of Substance Abuse: NA  Results for orders placed or performed during the hospital encounter of 11/08/14 (from the past 72 hour(s))  Basic metabolic panel     Status: Abnormal   Collection Time: 11/08/14  3:20 PM  Result Value Ref Range   Sodium 135 135 - 145 mmol/L    Potassium 3.7 3.5 - 5.1 mmol/L   Chloride 101 101 - 111 mmol/L   CO2 26 22 - 32 mmol/L   Glucose, Bld 110 (H) 65 - 99 mg/dL   BUN 15 6 - 20 mg/dL   Creatinine, Ser 1.13 (H) 0.44 - 1.00 mg/dL   Calcium 8.7 (L) 8.9 - 10.3 mg/dL   GFR calc non Af Amer 54 (L) >60 mL/min   GFR calc Af Amer >60 >60 mL/min    Comment: (NOTE) The eGFR has been calculated using the CKD EPI equation. This calculation has not been validated in all clinical situations. eGFR's persistently <60 mL/min signify possible Chronic Kidney Disease.    Anion gap 8 5 - 15  CBC with Differential     Status: None   Collection Time: 11/08/14  3:20 PM  Result Value Ref Range   WBC 7.3 4.0 - 10.5 K/uL   RBC 4.74 3.87 - 5.11 MIL/uL   Hemoglobin 14.6 12.0 - 15.0 g/dL   HCT 43.6 36.0 - 46.0 %   MCV 92.0 78.0 - 100.0 fL   MCH 30.8 26.0 - 34.0 pg   MCHC 33.5 30.0 - 36.0 g/dL   RDW 13.8 11.5 - 15.5 %   Platelets 213 150 - 400 K/uL   Neutrophils Relative % 49 43 - 77 %   Neutro Abs 3.6 1.7 - 7.7 K/uL   Lymphocytes Relative 43 12 - 46 %   Lymphs Abs 3.2 0.7 - 4.0 K/uL   Monocytes Relative 5 3 - 12 %   Monocytes Absolute 0.4 0.1 - 1.0 K/uL   Eosinophils Relative 3 0 - 5 %   Eosinophils Absolute 0.2 0.0 - 0.7 K/uL   Basophils Relative 0 0 - 1 %   Basophils Absolute 0.0 0.0 - 0.1 K/uL  Ethanol     Status: None   Collection Time: 11/08/14  6:20 PM  Result Value Ref Range   Alcohol, Ethyl (B) <5 <5 mg/dL    Comment:  LOWEST DETECTABLE LIMIT FOR SERUM ALCOHOL IS 5 mg/dL FOR MEDICAL PURPOSES ONLY     Observation Level/Precautions:  15 minute checks  Laboratory:  CBC Chemistry Profile UDS UA  Psychotherapy:    Medications:    Consultations:    Discharge Concerns:    Estimated LOS:  Other:     Psychological Evaluations: No   Treatment Plan Summary: Daily contact with patient to assess and evaluate symptoms and progress in treatment and Medication management  Medical Decision Making:  New problem, with  additional work up planned, Review of Psycho-Social Stressors (1), Review or order clinical lab tests (1), Review of Medication Regimen & Side Effects (2) and Review of New Medication or Change in Dosage (2)   Jackie [name] is a 54 year old female with history of schizoaffective disorder admitting for psychotic break.  1. Psychosis. The patient was recently discharged from [facility] on a combination of Risperdal Consta 50 mg every 2 weeks next injection 8/11 and oral Risperdal 1 mg at bedtime. We will increase Risperdal to 3 mg twice daily by mouth. We will contact her act team to switch Risperdal Consta to [place].  2. Mood. In addition to psychosis at the patient seems to be manic restless and rule out agitated intrusive disorganized. She has been maintained on Neurontin in the community. This is a new medication. We will start Depakote for mood stabilization.  3, HIV. We will continue all antiretrovirals.   4. COPD. She is on inhalers.  5. Anxiety. She is on Vistaril.  6. Constipation. MiraLAX.  7. GERD. We will offer pantoprazole.  8. Disposition. She will be discharged to home. She will follow up with and [facility].  I certify that inpatient services furnished can reasonably be expected to improve the patient's condition.   Jolanta Pucilowska 8/2/20162:56 PM

## 2014-11-10 NOTE — Progress Notes (Signed)
D: Patient's mood was labile.  Patient had numerous loud outbursts which disrupted the milieu.  Patient need constant redirection.  Patient did attend evening group. Patient visible on the milieu. A: Support and encouragement offered. Q 15 min checks continued for patient safety. R: Patient receptive. Patient remains safe on the unit.

## 2014-11-10 NOTE — Progress Notes (Signed)
Patient remains disorganized and needs frequent redirection and support. Patient cooperative with meds. Intrusive with others and limits set. Safety maintained. Eats meals and tolerates fluids well. Enjoys short visit to outside courtyard. Safety maintained.

## 2014-11-10 NOTE — [facility] Suicide Risk Assessment (Signed)
[facility] Admission Suicide Risk Assessment   Nursing information obtained from:  Patient Demographic factors:  Low socioeconomic status, Unemployed Current Mental Status:  NA Loss Factors:  NA Historical Factors:  Impulsivity Risk Reduction Factors:  Positive social support Total Time spent with patient: 1 hour Principal Problem: Paranoid schizophrenia Diagnosis:   Patient Active Problem List   Diagnosis Date Noted  . Schizophrenia, paranoid type [F20.0] 11/09/2014  . 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 Asthma, chronic [J45.909]   . HIV disease [B20]   . 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 Paranoid schizophrenia [F20.0]   . Psychosis [F29] 10/30/2014  . Psychoses [F29]   . Hyponatremia [E87.1] 10/22/2014  . Microscopic hematuria [R31.2] 01/29/2014  . Abdominal pain [R10.9] 12/12/2013  . Vulvar irritation [N90.89] 12/12/2013  . COPD (chronic obstructive pulmonary disease) [J44.9] 02/18/2013  . Tobacco use disorder [Z72.0] 02/18/2013  . Hepatitis B infection [B16.9]   . Pre-ulcerative corn or callous [L84] 05/07/2012  . Urinary incontinence, nocturnal enuresis [N39.44] 05/07/2012  . Fecal incontinence [R15.9] 05/07/2012  . Systolic murmur [R01.1] 05/07/2012  . Routine health maintenance [Z00.00] 05/07/2012  . Asthma [493] 08/18/2010  . HIV (human immunodeficiency virus infection) [Z21] 08/18/2010  . Schizophrenia [F20.9] 08/18/2010  . Herpes simplex infection [B00.9] 08/18/2010     Continued Clinical Symptoms:  Alcohol Use Disorder Identification Test Final Score (AUDIT): 0 The "Alcohol Use Disorders Identification Test", Guidelines for Use in Primary Care, Second Edition.  World Science writer [facility]). Score between 0-7:  no or low risk or alcohol related problems. Score between 8-15:  moderate risk of alcohol related problems. Score between 16-19:  high risk of alcohol related problems. Score 20 or above:  warrants further diagnostic evaluation  for alcohol dependence and treatment.   CLINICAL FACTORS:   Schizophrenia:   Depressive state Paranoid or undifferentiated type   Musculoskeletal: Strength & Muscle Tone: within normal limits Gait & Station: normal Patient leans: N/A  Psychiatric Specialty Exam: Physical Exam  Nursing note and vitals reviewed. Constitutional: She appears well-developed and well-nourished.  HENT:  Head: Normocephalic and atraumatic.  Eyes: Conjunctivae and EOM are normal. Pupils are equal, round, and reactive to light.  Neck: Normal range of motion. Neck supple.  Cardiovascular: Normal rate, regular rhythm and normal heart sounds.   Respiratory: Effort normal and breath sounds normal.  GI: Soft. Bowel sounds are normal.  Musculoskeletal: Normal range of motion.  Neurological: She is alert.  Skin: Skin is warm and dry.    Review of Systems  All other systems reviewed and are negative.   Blood pressure 91/64, pulse 92, temperature 98 F (36.7 C), temperature source Oral, resp. rate 18, height  (1.499 m), weight 63.504 kg (140 lb), SpO2 98 %.Body mass index is 28.26 kg/(m^2).  General Appearance: Disheveled  Eye Solicitor::  Fair  Speech:  Pressured  Volume:  Increased  Mood:  Dysphoric  Affect:  Labile  Thought Process:  Disorganized  Orientation:  Other:  to person and year only.  Thought Content:  Unable to assess  Suicidal Thoughts:  No  Homicidal Thoughts:  No  Memory:  Immediate;   Poor Recent;   Poor Remote;   Poor  Judgement:  Poor  Insight:  Lacking  Psychomotor Activity:  Increased  Concentration:  Poor  Recall:  Poor  Fund of Knowledge:Fair  Language: Fair  Akathisia:  No  Handed:  Right  AIMS (if indicated):     Assets:  Financial Resources/Insurance Housing  Sleep:  Number of Hours: 6  Cognition: WNL  ADL's:  Intact     COGNITIVE FEATURES THAT CONTRIBUTE TO RISK:  None    SUICIDE RISK:   Moderate:  Frequent suicidal ideation with limited intensity, and  duration, some specificity in terms of plans, no associated intent, good self-control, limited dysphoria/symptomatology, some risk factors present, and identifiable protective factors, including available and accessible social support.  PLAN OF CARE: Hospital admission, medication management, discharge planning.  Medical Decision Making:  New problem, with additional work up planned, Review of Psycho-Social Stressors (1), Review or order clinical lab tests (1), Review of Medication Regimen & Side Effects (2) and Review of New Medication or Change in Dosage (2)   [name] is a 54 year old female with history of schizoaffective disorder admitting for psychotic break.  1. Psychosis. The patient was recently discharged from [facility] on a combination of Risperdal Consta 50 mg every 2 weeks next injection 8/11 and oral Risperdal 1 mg at bedtime. We will increase Risperdal to 3 mg twice daily by mouth. We will contact her act team to switch Risperdal Consta to [name].  2. Mood. In addition to psychosis at the patient seems to be manic restless and rule out agitated intrusive disorganized. She has been maintained on Neurontin in the community. This is a new medication. We will start Depakote for mood stabilization.  3, HIV. We will continue all antiretrovirals.   4. COPD. She is on inhalers.  5. Anxiety. She is on Vistaril.  6. Constipation. MiraLAX.  7. GERD. We will offer pantoprazole.  8. Disposition. She will be discharged to home. She will follow up with and [facility].  I certify that inpatient services furnished can reasonably be expected to improve the patient's condition.   [name] 11/10/2014, 2:47 PM

## 2014-11-10 NOTE — Progress Notes (Signed)
Patient needs frequent monitoring and interactions. Encouraged to rest in bed. Adls encouraged. Patient for shower. Patient encouraged to nap. Patient is hyperactive and hyperverbal. Appetite good and tolerates po fluids well. Safety monitored as patient wanders unit. Presents to nurse's station frequently and support and redirection provided. Areas monitored and cleansed as patient with practices such as touching box of cups in med room.

## 2014-11-10 NOTE — Plan of Care (Signed)
Problem: Alteration in thought process Goal: LTG-Patient has not harmed self or others in at least 2 days Outcome: Progressing No SI/HI at this time.      

## 2014-11-10 NOTE — [facility] Group Notes (Signed)
[facility] Group Notes:  (Nursing/MHT/Case Management/Adjunct)  Date:  11/10/2014  Time:  5:28 PM  Type of Therapy:  Psychoeducational Skills  Participation Level:  Minimal  Participation Quality:  Monopolizing  Affect:  Excited and Not Congruent  Cognitive:  Disorganized, Confused, Delusional and Lacking  Insight:  None  Engagement in Group:  Distracting, Monopolizing, Off Topic and Poor  Modes of Intervention:  Activity, Discussion, Education and Exploration  Summary of Progress/Problems:  Foy Guadalajara 11/10/2014, 5:28 PM

## 2014-11-10 NOTE — Progress Notes (Signed)
Patient with sad affect and disorganized behavior. Pleasant and talkative, does well with support and encouragement. Eats am meal, takes meds and has shower and dresses. States she "uses a cane at home" and walker, adjusted and supplied. Steady gait. Uses walker at time and at times does not use walker. Patient states she would " like to work here, retire at 13, and then have a baby". Safety maintained.
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
## 2014-11-10 NOTE — Progress Notes (Signed)
Contacted by nursing at 10: 20 pm as pt has been disruptive and loud, nurses have been unable to redirect her, other patients are complaining as they can not sleep.  Patient still very agitated after receiving trazodone, risperdal 3 mg, and ativan 1 mg.  I will order ativan 2 mg q 4 h prn and olanzapine 10 mg po.

## 2014-11-10 NOTE — Progress Notes (Signed)
Patient mood is labile.  Patient is yelling and coming into the hallway naked.  Called MD.  Orders to be given.

## 2014-11-11 LAB — COMPREHENSIVE METABOLIC PANEL
ALT: 24 U/L (ref 14–54)
AST: 32 U/L (ref 15–41)
Albumin: 3.7 g/dL (ref 3.5–5.0)
Alkaline Phosphatase: 95 U/L (ref 38–126)
Anion gap: 10 (ref 5–15)
BUN: 15 mg/dL (ref 6–20)
CHLORIDE: 100 mmol/L — AB (ref 101–111)
CO2: 25 mmol/L (ref 22–32)
Calcium: 9.2 mg/dL (ref 8.9–10.3)
Creatinine, Ser: 1.02 mg/dL — ABNORMAL HIGH (ref 0.44–1.00)
GFR calc Af Amer: >60 mL/min (ref >60)
GFR calc non Af Amer: >60 mL/min (ref >60)
Glucose, Bld: 79 mg/dL (ref 65–99)
Potassium: 4.7 mmol/L (ref 3.5–5.1)
Sodium: 135 mmol/L (ref 135–145)
TOTAL PROTEIN: 7.3 g/dL (ref 6.5–8.1)
Total Bilirubin: 0.7 mg/dL (ref 0.3–1.2)

## 2014-11-11 LAB — AMMONIA: Ammonia: 52 umol/L — ABNORMAL HIGH (ref 9–35)

## 2014-11-11 MED ORDER — CHLORPROMAZINE HCL 50 MG PO TABS
50.0000 mg | ORAL_TABLET | Freq: Four times a day (QID) | ORAL | Status: AC | PRN
  Administered 2014-11-11 – 2014-11-18 (×7): 50 mg via ORAL
  Filled 2014-11-11 (×7): qty 1

## 2014-11-11 MED ORDER — VALPROIC ACID 250 MG PO CAPS
500.0000 mg | ORAL_CAPSULE | Freq: Three times a day (TID) | ORAL | Status: DC
  Administered 2014-11-11: 500 mg via ORAL
  Filled 2014-11-11 (×6): qty 2

## 2014-11-11 MED ORDER — TEMAZEPAM 15 MG PO CAPS
30.0000 mg | ORAL_CAPSULE | Freq: Every day | ORAL | Status: DC
  Administered 2014-11-11 – 2014-11-12 (×2): 30 mg via ORAL
  Filled 2014-11-11 (×2): qty 2

## 2014-11-11 MED ORDER — DIPHENHYDRAMINE HCL 50 MG/ML IJ SOLN
50.0000 mg | Freq: Once | INTRAMUSCULAR | Status: AC
Start: 2014-11-11 — End: 2014-11-11
  Administered 2014-11-11: 50 mg via INTRAMUSCULAR
  Filled 2014-11-11: qty 1

## 2014-11-11 MED ORDER — OLANZAPINE 10 MG IM SOLR
10.0000 mg | Freq: Once | INTRAMUSCULAR | Status: AC
  Administered 2014-11-11: 10 mg via INTRAMUSCULAR
  Filled 2014-11-11: qty 10

## 2014-11-11 NOTE — Progress Notes (Signed)
Patient awakened needing constant redirection as she attempts to enter the room of other patients.

## 2014-11-11 NOTE — [facility] Group Notes (Signed)
[facility] Group Notes:  (Nursing/MHT/Case Management/Adjunct)  Date:  11/11/2014  Time:  1:36 PM  Type of Therapy:  Psychoeducational Skills  Participation Level:  Did Not Attend   Lynelle Smoke [facility] 11/11/2014, 1:36 PM

## 2014-11-11 NOTE — Progress Notes (Signed)
[facility] MD Progress Note  11/11/2014 4:22 PM [name]  MRN:  [id number]  Subjective:  Ms. [name] is was pleasantly confused yesterday. Last night she became insomniac and agitated. She received multiple doses of when necessary medications. This morning she defecated and urinated on herself. As she is been put on the back hall and security as she is hard to redirect. During the day she was rather sleepy from medications. She had lunch. She is now incontinent of urine. She accepts medications. I notice that no urinalysis was obtained on admission. We will do a urinalysis and urine culture and ask medicine for consultation if necessary.  Principal Problem: Paranoid schizophrenia Diagnosis:   Patient Active Problem List   Diagnosis Date Noted  . Schizophrenia, paranoid type [F20.0] 11/09/2014  . 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 Asthma, chronic [J45.909]   . HIV disease [B20]   . 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 Paranoid schizophrenia [F20.0]   . Psychosis [F29] 10/30/2014  . Psychoses [F29]   . Hyponatremia [E87.1] 10/22/2014  . Microscopic hematuria [R31.2] 01/29/2014  . Abdominal pain [R10.9] 12/12/2013  . Vulvar irritation [N90.89] 12/12/2013  . COPD (chronic obstructive pulmonary disease) [J44.9] 02/18/2013  . Tobacco use disorder [Z72.0] 02/18/2013  . Hepatitis B infection [B16.9]   . Pre-ulcerative corn or callous [L84] 05/07/2012  . Urinary incontinence, nocturnal enuresis [N39.44] 05/07/2012  . Fecal incontinence [R15.9] 05/07/2012  . Systolic murmur [R01.1] 05/07/2012  . Routine health maintenance [Z00.00] 05/07/2012  . Asthma [493] 08/18/2010  . HIV (human immunodeficiency virus infection) [Z21] 08/18/2010  . Schizophrenia [F20.9] 08/18/2010  . Herpes simplex infection [B00.9] 08/18/2010   Total Time spent with patient: 20 minutes   Past Medical History:  Past Medical History  Diagnosis Date  . GERD (gastroesophageal reflux disease)   .  Schizophrenia   . AIDS 12-10-2007  . Herpes simplex   . History of cholecystectomy   . Cataract   . Nonspecific reaction to tuberculin skin test without active tuberculosis 11-2008 [facility]   . Seizures   . Asthmatic bronchitis , chronic   . Hepatitis B infection     followed by ID  . Chronic hyponatremia     Past Surgical History  Procedure Laterality Date  . Cholecystectomy    . Leg surgery      right leg, s/p accident   Family History:  Family History  Problem Relation Age of Onset  . Diabetes Sister   . Cancer Sister     breast  . Diabetes Brother   . Hypertension Brother   . Cancer Mother     lung  . Hypertension Mother   . Heart disease Father    Social History:  History  Alcohol Use No    Comment: Patient denies      History  Drug Use No    Comment: Patient denies    History   Social History  . Marital Status: Single    Spouse Name: N/A  . Number of Children: N/A  . Years of Education: N/A   Social History Main Topics  . Smoking status: Current Every Day Smoker -- 2.00 packs/day for 35 years    Types: Cigarettes    Start date: 04/10/1974  . Smokeless tobacco: Never Used     Comment: Previously smoked 2ppd, might start patch  . Alcohol Use: No     Comment: Patient denies   . Drug Use: No     Comment: Patient  denies  . Sexual Activity: Not Currently     Comment: pt. declined condoms   Other Topics Concern  . None   Social History Narrative   Additional History:    Sleep: Fair  Appetite:  Fair   Assessment:   Musculoskeletal: Strength & Muscle Tone: within normal limits Gait & Station: normal Patient leans: N/A   Psychiatric Specialty Exam: Physical Exam  Nursing note and vitals reviewed.   Review of Systems  Gastrointestinal: Positive for diarrhea.  Genitourinary: Positive for urgency.  All other systems reviewed and are negative.   Blood pressure 107/74, pulse 106, temperature 98.2 F (36.8 C), temperature source Oral, resp.  rate 20, height 4\' 11"  (1.499 m), weight 63.504 kg (140 lb), SpO2 98 %.Body mass index is 28.26 kg/(m^2).  General Appearance: Disheveled  Eye Solicitor::  Fair  Speech:  Slow  Volume:  Decreased  Mood:  Irritable  Affect:  Labile  Thought Process:  Disorganized  Orientation:  Other:  Person only  Thought Content:  Delusions, Hallucinations: Auditory and Paranoid Ideation  Suicidal Thoughts:  No  Homicidal Thoughts:  No  Memory:  Immediate;   Poor Recent;   Poor Remote;   Poor  Judgement:  Poor  Insight:  Lacking  Psychomotor Activity:  Increased  Concentration:  Poor  Recall:  Poor  Fund of Knowledge:Fair  Language: Fair  Akathisia:  No  Handed:  Right  AIMS (if indicated):     Assets:  Communication Skills Desire for Improvement  ADL's:  Intact  Cognition: WNL  Sleep:  Number of Hours: 6     Current Medications: Current Facility-Administered Medications  Medication Dose Route Frequency Provider Last Rate Last Dose  . acetaminophen (TYLENOL) tablet 650 mg  650 mg Oral Q6H PRN Corliss Lamartina B Arben Packman, MD      . albuterol (PROVENTIL HFA;VENTOLIN HFA) 108 (90 BASE) MCG/ACT inhaler 2 puff  2 puff Inhalation Q4H Ethen Bannan B Alazne Quant, MD   2 puff at 11/11/14 1244  . alum & mag hydroxide-simeth (MAALOX/MYLANTA) 200-200-20 MG/5ML suspension 30 mL  30 mL Oral Q4H PRN Rajendra Spiller B Quron Ruddy, MD      . benztropine (COGENTIN) tablet 0.5 mg  0.5 mg Oral BID Shari Prows, MD   0.5 mg at 11/11/14 0836  . chlorproMAZINE (THORAZINE) tablet 50 mg  50 mg Oral QID PRN Shari Prows, MD   50 mg at 11/11/14 0800  . Darunavir Ethanolate (PREZISTA) tablet 800 mg  800 mg Oral Q breakfast Leigha Olberding B Zemira Zehring, MD   800 mg at 11/11/14 0841  . divalproex (DEPAKOTE) DR tablet 500 mg  500 mg Oral TID WC Harvel Meskill B Brya Simerly, MD   500 mg at 11/11/14 0836  . emtricitabine-tenofovir (TRUVADA) 200-300 MG per tablet 1 tablet  1 tablet Oral Daily Shari Prows, MD   1 tablet at 11/11/14 0841   . LORazepam (ATIVAN) tablet 2 mg  2 mg Oral Q4H PRN Jimmy Footman, MD   2 mg at 11/11/14 0836  . LORazepam (ATIVAN) tablet 2 mg  2 mg Oral BID Jimmy Footman, MD   2 mg at 11/11/14 [phone]  . magnesium hydroxide (MILK OF MAGNESIA) suspension 30 mL  30 mL Oral Daily PRN Lacora Folmer B Nur Rabold, MD      . pantoprazole (PROTONIX) EC tablet 40 mg  40 mg Oral Daily Cornellius Kropp B Treyana Sturgell, MD   40 mg at 11/11/14 0839  . polyethylene glycol (MIRALAX / GLYCOLAX) packet 17 g  17 g Oral Daily Tereza Gilham B  Marasia Newhall, MD   17 g at 11/11/14 0835  . raltegravir (ISENTRESS) tablet 400 mg  400 mg Oral BID Shari Prows, MD   400 mg at 11/11/14 0837  . risperiDONE (RISPERDAL M-TABS) disintegrating tablet 3 mg  3 mg Oral BID Shari Prows, MD   3 mg at 11/11/14 0835  . [START ON 11/19/2014] risperiDONE microspheres (RISPERDAL CONSTA) injection 50 mg  50 mg Intramuscular Q14 Days Rasheena Talmadge B Carlotta Telfair, MD      . ritonavir (NORVIR) tablet 100 mg  100 mg Oral Q breakfast Dejanay Wamboldt B Allayna Erlich, MD   100 mg at 11/11/14 0839  . temazepam (RESTORIL) capsule 30 mg  30 mg Oral QHS Kyl Givler B Shalisha Clausing, MD        Lab Results: No results found for this or any previous visit (from the past 48 hour(s)).  Physical Findings: AIMS: Facial and Oral Movements Muscles of Facial Expression: Minimal Lips and Perioral Area: Minimal Jaw: Minimal Tongue: Minimal,Extremity Movements Upper (arms, wrists, hands, fingers): None, normal Lower (legs, knees, ankles, toes): None, normal, Trunk Movements Neck, shoulders, hips: None, normal, Overall Severity Severity of abnormal movements (highest score from questions above): None, normal Incapacitation due to abnormal movements: None, normal Patient's awareness of abnormal movements (rate only patient's report): No Awareness, Dental Status Current problems with teeth and/or dentures?: No Does patient usually wear dentures?: Yes  CIWA:    COWS:     Treatment  Plan Summary: Daily contact with patient to assess and evaluate symptoms and progress in treatment and Medication management   Medical Decision Making:  New problem, with additional work up planned, Review of Psycho-Social Stressors (1), Review or order clinical lab tests (1), Review of Medication Regimen & Side Effects (2) and Review of New Medication or Change in Dosage (2)   Miss Biehler is a 54 year old female with history of schizoaffective disorder admitting for psychotic break.  0. Agitation. Thorazine 50 mg tid prn.    1. Psychosis. The patient was recently discharged from [facility] on a combination of Risperdal Consta 50 mg every 2 weeks next injection 8/11 and oral Risperdal 1 mg at bedtime. We will increase Risperdal to 3 mg twice daily by mouth. We will contact her act team to switch Risperdal Consta to [name].  2. Mood. In addition to psychosis at the patient seems to be manic restless and rule out agitated intrusive disorganized. She has been maintained on Neurontin in the community. This is a new medication. We will start Depakote for mood stabilization.  3, HIV. We will continue all antiretrovirals.   4. COPD. She is on inhalers.  5. Anxiety. She is on Vistaril.  6. Constipation. MiraLAX.  7. GERD. We will offer pantoprazole.  8. Insomnia. Restoril 30 mg nightly.   9. Disposition. She will be discharged to home. She will follow up with and [facility].     Leslee Haueter 11/11/2014, 4:22 PM

## 2014-11-11 NOTE — Plan of Care (Signed)
Problem: Ineffective individual coping Goal: STG: Pt will be able to identify effective and ineffective STG: Pt will be able to identify effective and ineffective coping patterns  Outcome: Not Progressing Patient remains wandering hall, attempts to drink coffee, attempts to enter other patient room and nurse's station. Patient room changed to 314 and staff placed on hall to monitor patient safety and adls. MD aware.

## 2014-11-11 NOTE — [facility] Group Notes (Signed)
[facility] LCSW Group Therapy  11/11/2014 3:40 PM  Type of Therapy:  Group Therapy  Participation Level:  Did Not Attend  Participation Quality:    Affect:    Cognitive:    Insight:    Engagement in Therapy:    Modes of Intervention:    Summary of Progress/Problems:  [name] 11/11/2014, 3:40 PM

## 2014-11-11 NOTE — Progress Notes (Signed)
Patient ID: [name], female   DOB: 1960/07/18, 54 y.o.   MRN: [id number] Unable to complete assessment due to patient's acute symptoms, continues to need constant redirection on unit, disorganized and delusional.  Urinating and having bowel movements in hallways, will follow up as Pt symptoms improve.

## 2014-11-11 NOTE — Progress Notes (Signed)
Patient with sad affect, confused and wandering hall in unit, attempting to carry coffee and attempts to enter patient's rooms and nurse's station. Patient hyperverbal, delusional stating she works here and needs her "paycheck". Patient slept 3 hours last night. MD orders Thorazine 50 mg po prn for agitation and med administered with fair effect. Patient moved to room 314. Patient yelling and crying for coffee.Patient pushing her hamper up and down the hall.  AM meds administered with prn as ordered with good effect. Patient agitation decreased and patient motor activity decreased. Patient has large BM all over bathroom. Shower and housekeeping in to help to clean patient and bathroom. Patient to bed to rest. Patient up to hall and urinates in hallway. Staff assists to clean up patient, clean up hall. Patient to bed to rest. Safety maintained.

## 2014-11-11 NOTE — Progress Notes (Signed)
Patient continues to roam the unit attempting to enter the room of other patients.  MD called.  Orders given.

## 2014-11-11 NOTE — Progress Notes (Signed)
Patient sleeps poorly this afternoon, sitter reports patient awakens frequently and is encouraged to return to sleep. Stimulation decreased to patient. Refuses lunch, spits out 2 pm med. MD aware and into evaluate. Patient takes 5 pm med as ordered. Patient changes Depakote to sprinkles for next administration to aid in compliance. MD orders urine sample for lab and blood work. Patient unable to walk to bathroom. Tired and sleepy. Blood lab draw obtained with results pending. Patient able to sit up on side of bed and eat dinner with good appetite. Requests orange juice and ginger ale to drink. Tolerated well. Safety maintained.

## 2014-11-11 NOTE — Progress Notes (Signed)
Admission Note:  D: Pt appeared manic moving fast on unit , talking fast  With pressured speech , Patient taking her teeth out and pulling her wig off. A: Pt admitted to unit per protocol, skin assessment and search done and no contraband found.  Pt  educated on therapeutic milieu rules. Pt was introduced to milieu by nursing staff.    R: Pt was receptive to education about the milieu .  15 min safety checks started. Clinical research associate offered support
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
## 2014-11-12 LAB — URINALYSIS COMPLETE WITH MICROSCOPIC (ARMC ONLY)
BACTERIA UA: NONE SEEN
BILIRUBIN URINE: NEGATIVE
Glucose, UA: NEGATIVE mg/dL
HGB URINE DIPSTICK: NEGATIVE
KETONES UR: NEGATIVE mg/dL
LEUKOCYTES UA: NEGATIVE
NITRITE: NEGATIVE
Protein, ur: NEGATIVE mg/dL
Specific Gravity, Urine: 1.011 (ref 1.005–1.030)
pH: 6 (ref 5.0–8.0)

## 2014-11-12 NOTE — [facility] Group Notes (Signed)
[facility] LCSW Group Therapy  11/12/2014 4:01 PM  Type of Therapy:  Group Therapy  Participation Level:  Did Not Attend  Participation Quality:    Affect:    Cognitive:    Insight:    Engagement in Therapy:    Modes of Intervention:    Summary of Progress/Problems:  [name] 11/12/2014, 4:01 PM

## 2014-11-12 NOTE — Progress Notes (Signed)
Patient ID: [name], female   DOB: Jan 18, 1961, 54 y.o.   MRN: [id number]  CSW attempted to complete assessment. Pt was disorganized and difficult to understand. CSW will attempt again tomorrow.   [name] MSW, LCSWA  11/12/2014 4:00 PM

## 2014-11-12 NOTE — Progress Notes (Signed)
Recreation Therapy Notes  At approximately 11:50 am, LRT spoke with nursing regarding patient's assessment. LT explained content of assessment. Patient's nurse reported patient was not appropriate for assessment at this time.  Jacquelynn Cree, LRT/CTRS 11/12/2014 12:21 PM

## 2014-11-12 NOTE — Progress Notes (Signed)
Recreation Therapy Notes  Date: 08.04.16 Time: 3:00 pm Location: Craft Room  Group Topic: Self-expression  Goal Area(s) Addresses:  Patient will indentify one color per emotion listed on wheel. Patient will verbalize benefit of using art as a means of self-expression. Patient will verbalize one emotion experienced during session. Patient will be educated on other forms of self-expression.  Behavioral Response: Left early  Intervention: Emotion Wheel  Activity: Patients were given an Emotion Wheel with 7 different emotions. Patients were instructed to pick a color for each emotion and color it in.  Education: LRT educated patients on different forms of self-expression.  Education Outcome: Patient left group before LRT educated patients.  Clinical Observations/Feedback: Patient colored some of her worksheet. Patient left group at approximately 3:18 pm. Patient did not return to group.  Jacquelynn Cree, LRT/CTRS 11/12/2014 3:55 PM
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
## 2014-11-12 NOTE — Plan of Care (Signed)
Problem: Alteration in thought process Goal: LTG-Patient behavior demonstrates decreased signs psychosis (Patient behavior demonstrates decreased signs of psychosis to the point the patient is safe to return home and continue treatment in an outpatient setting.)  Outcome: Not Progressing Continues to have confusion and delusional thoughts.

## 2014-11-12 NOTE — Tx Team (Signed)
Interdisciplinary Treatment Plan Update (Adult)  Date:  11/12/2014 Time Reviewed:  4:24 PM  Progress in Treatment: Attending groups: No. Participating in groups:  No. Taking medication as prescribed:  Yes. Tolerating medication:  Yes. Family/Significant othe contact made:  No, will contact:  Brother Patient understands diagnosis:  No. Discussing patient identified problems/goals with staff:  Yes. Medical problems stabilized or resolved:  Yes. Denies suicidal/homicidal ideation: Yes. Issues/concerns per patient self-inventory:  No. Other:  New problem(s) identified:No   Discharge Plan or Barriers: Pt plans to return home and follow up with outpatient   Reason for Continuation of Hospitalization: Mania Medication stabilization  Comments:[name] has a long history of schizoaffective disorder. She is in the care of [facility]. 3 weeks ago Neurontin was added to her medication and the patient's brother felt that it changed patient's behavior. He became agitated, intrusive, disorganized, unable to care for herself, and suicide. She was briefly hospitalized at [facility],. She returns to the hospital in apparently manic episode. I cannot obtain any information from the patient it is mostly obtained from the chart we were unable to contact her brother as a few. It is unclear if the patient has been compliant with her oral medication but she is maintained on Risperdal Consta injection. It is difficult to know whether that the patient has exacerbation of bipolar illness or if Risperdal Consta has not been adequate treatment. In my opinion it is not advisable medication. In addition her oral Risperdal was lowered to 1 mg a night. It is quite possible that the patient didn't take any. It is worrisome because she has HIV infection and needs to stay on her medication regimen. She is completely disorganized and unable to hold a conversation she is oriented to person and year only. She  believes that she is been applying for a job here. She wants to retire to 84. She is requesting a piata for a birthday party with [employer]. She is disorganized, pleasantly confused and intrusive in groups. She denies thoughts of hurting herself or others. She denies symptoms of depression or anxiety. She denies alcohol or substance use.  Estimated length of stay: 7 days   New goal(s):  Review of initial/current patient goals per problem list:   1.  Goal(s): Patient will participate in aftercare plan * Met: No * Target date: at discharge * As evidenced by: Patient will participate within aftercare plan AEB aftercare provider and housing plan at discharge being identified.   2.  Goal (s): Patient will exhibit decreased manic symptoms. * Met: No *  Target date: at discharge * As evidenced by: Patient will n or be deemed stable for discharge by MD.  3.  Goal (s): Patient will demonstrate decreased symptoms of psychosis. * Met: No  *  Target date: at discharge * As evidenced by: Patient will not endorse signs of psychosis or be deemed stable for discharge by MD.   Attendees: Patient:  [name] 8/4/20164:24 PM  Family:   8/4/20164:24 PM  Physician:  Dr. Bary Leriche 8/4/20164:24 PM  Nursing:   Marcie Bal, RN 8/4/20164:24 PM  Clinical Social Worker: [place], [place] 8/4/20164:24 PM  Counselor:   8/4/20164:24 PM  Other:  Everitt Amber, LRT 8/4/20164:24 PM  Other:   8/4/20164:24 PM  Other:   8/4/20164:24 PM  Other:  8/4/20164:24 PM  Other:  8/4/20164:24 PM  Other:  8/4/20164:24 PM  Other:  8/4/20164:24 PM  Other:  8/4/20164:24 PM  Other:  8/4/20164:24 PM  Other:   8/4/20164:24 PM   Scribe for Treatment Team:   Wray Kearns, 11/12/2014, 4:24 PM
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
## 2014-11-12 NOTE — Plan of Care (Signed)
Problem: Alteration in thought process Goal: LTG-Patient behavior demonstrates decreased signs psychosis (Patient behavior demonstrates decreased signs of psychosis to the point the patient is safe to return home and continue treatment in an outpatient setting.)  Outcome: Progressing Pt is responding positiveley to medication regimen and is more receptive to staff redirection.

## 2014-11-12 NOTE — Progress Notes (Signed)
1:1 sitter for safety. Speech slurred and garbled.  Oriented to person and place.  Medicated x2 for agitation.

## 2014-11-12 NOTE — [facility] Group Notes (Signed)
[facility] Group Notes:  (Nursing/MHT/Case Management/Adjunct)  Date:  11/12/2014  Time:  1:36 PM  Type of Therapy:  Movement Therapy  Participation Level:  Active  Participation Quality:  Appropriate and Redirectable  Affect:  Excited  Cognitive:  Lacking  Insight:  Lacking  Engagement in Group:  Lacking  Modes of Intervention:  Activity  Summary of Progress/Problems:  [name] [name] 11/12/2014, 1:36 PM

## 2014-11-12 NOTE — Progress Notes (Signed)
D: Pt is awake and active in the milieu this evening. Pt mood is labile and her affect is bright. Pt is somewhat intrusive and needs redirection at times but follows staff instructions. Pt is oriented to self and place.   A: Writer provided emotional support and administered medications as prescribed.   R: Pt is participating in unit activities, although her speech remains slurred and content is tangential. Pt interaction is improved and she is responding well to medication regimen. Pt went to bed following snack and medication administration and was soon asleep.

## 2014-11-13 LAB — AMMONIA: Ammonia: 30 umol/L (ref 9–35)

## 2014-11-13 MED ORDER — TEMAZEPAM 15 MG PO CAPS
15.0000 mg | ORAL_CAPSULE | Freq: Every day | ORAL | Status: DC
  Administered 2014-11-13 – 2014-11-23 (×11): 15 mg via ORAL
  Filled 2014-11-13 (×11): qty 1

## 2014-11-13 NOTE — Progress Notes (Signed)
Patient is alert today to person and place.  She is able to follow directions and has been pleasant.  Affect blunted but brightens upon approach.  Patient remains on 1:1 for safety (fall) reasons.  She has been medication compliant and has been eating well. Patient was also observed in day room interacting with peers and sitter.  Denies AH at this time.  Will cont to monitor for safety.

## 2014-11-13 NOTE — [facility] Group Notes (Signed)
[facility] Group Notes:  (Nursing/MHT/Case Management/Adjunct)  Date:  11/13/2014  Time:  4:39 PM  Type of Therapy:  Group Therapy  Participation Level:  Did Not Attend  Summary of Progress/Problems:  Jory Tanguma De'Chelle Dajon Lazar 11/13/2014, 4:39 PM

## 2014-11-13 NOTE — Progress Notes (Signed)
[facility] MD Progress Note  11/13/2014 2:25 PM JHANIA ETHERINGTON  MRN:  [id number]  Subjective: Miss Love is still sedated from her nighttime medications. She did have breakfast today. She seems somewhat confused even though her ammonia level is now normal. As she has been incontinent of urine and wears a diaper now. Urinalysis is not suggestive of urinary tract infection. Urine culture is still pending but is most likely contaminated. She has sitter due to agitated and unpredictable behavior and now due to somnolence. I discontinue the standing Ativan and Cogentin. She has Thorazine and Ativan available.  Principal Problem: Paranoid schizophrenia Diagnosis:   Patient Active Problem List   Diagnosis Date Noted  . Schizophrenia, paranoid type [F20.0] 11/09/2014  . 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 Asthma, chronic [J45.909]   . HIV disease [B20]   . 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 Paranoid schizophrenia [F20.0]   . Psychosis [F29] 10/30/2014  . Psychoses [F29]   . Hyponatremia [E87.1] 10/22/2014  . Microscopic hematuria [R31.2] 01/29/2014  . Abdominal pain [R10.9] 12/12/2013  . Vulvar irritation [N90.89] 12/12/2013  . COPD (chronic obstructive pulmonary disease) [J44.9] 02/18/2013  . Tobacco use disorder [Z72.0] 02/18/2013  . Hepatitis B infection [B16.9]   . Pre-ulcerative corn or callous [L84] 05/07/2012  . Urinary incontinence, nocturnal enuresis [N39.44] 05/07/2012  . Fecal incontinence [R15.9] 05/07/2012  . Systolic murmur [T34.2] 87/68/1157  . Routine health maintenance [Z00.00] 05/07/2012  . Asthma [493] 08/18/2010  . HIV (human immunodeficiency virus infection) [Z21] 08/18/2010  . Schizophrenia [F20.9] 08/18/2010  . Herpes simplex infection [B00.9] 08/18/2010   Total Time spent with patient: 20 minutes   Past Medical History:  Past Medical History  Diagnosis Date  . GERD (gastroesophageal reflux disease)   . Schizophrenia   . AIDS 12-10-2007  .  Herpes simplex   . History of cholecystectomy   . Cataract   . Nonspecific reaction to tuberculin skin test without active tuberculosis 11-2008 [facility]   . Seizures   . Asthmatic bronchitis , chronic   . Hepatitis B infection     followed by ID  . Chronic hyponatremia     Past Surgical History  Procedure Laterality Date  . Cholecystectomy    . Leg surgery      right leg, s/p accident   Family History:  Family History  Problem Relation Age of Onset  . Diabetes Sister   . Cancer Sister     breast  . Diabetes Brother   . Hypertension Brother   . Cancer Mother     lung  . Hypertension Mother   . Heart disease Father    Social History:  History  Alcohol Use No    Comment: Patient denies      History  Drug Use No    Comment: Patient denies    History   Social History  . Marital Status: Single    Spouse Name: N/A  . Number of Children: N/A  . Years of Education: N/A   Social History Main Topics  . Smoking status: Current Every Day Smoker -- 2.00 packs/day for 35 years    Types: Cigarettes    Start date: 04/10/1974  . Smokeless tobacco: Never Used     Comment: Previously smoked 2ppd, might start patch  . Alcohol Use: No     Comment: Patient denies   . Drug Use: No     Comment: Patient denies  . Sexual Activity: Not Currently  Comment: pt. declined condoms   Other Topics Concern  . None   Social History Narrative   Additional History:    Sleep: Good  Appetite:  Fair   Assessment:   Musculoskeletal: Strength & Muscle Tone: within normal limits Gait & Station: normal Patient leans: N/A   Psychiatric Specialty Exam: Physical Exam  Nursing note and vitals reviewed.   Review of Systems  Gastrointestinal: Positive for constipation.  Genitourinary: Positive for urgency and frequency.  All other systems reviewed and are negative.   Blood pressure 92/64, pulse 102, temperature 98.2 F (36.8 C), temperature source Oral, resp. rate 20, height 4' 11"   (1.499 m), weight 63.504 kg (140 lb), SpO2 98 %.Body mass index is 28.26 kg/(m^2).  General Appearance: Disheveled  Eye Contact::  Minimal  Speech:  Slow  Volume:  Decreased  Mood:  Depressed  Affect:  Flat  Thought Process:  Disorganized  Orientation:  Full (Time, Place, and Person)  Thought Content:  WDL  Suicidal Thoughts:  No  Homicidal Thoughts:  No  Memory:  Immediate;   Poor Recent;   Poor Remote;   Poor  Judgement:  Impaired  Insight:  Lacking  Psychomotor Activity:  Decreased  Concentration:  Fair  Recall:  [place]  Language: Fair  Akathisia:  No  Handed:  Right  AIMS (if indicated):     Assets:  Communication Skills Desire for Improvement Social Support  ADL's:  Intact  Cognition: WNL  Sleep:  Number of Hours: 7     Current Medications: Current Facility-Administered Medications  Medication Dose Route Frequency Provider Last Rate Last Dose  . acetaminophen (TYLENOL) tablet 650 mg  650 mg Oral Q6H PRN Taeya Theall B Priscila Bean, MD      . albuterol (PROVENTIL HFA;VENTOLIN HFA) 108 (90 BASE) MCG/ACT inhaler 2 puff  2 puff Inhalation Q4H Kynslei Art B Neilson Oehlert, MD   2 puff at 11/13/14 1155  . alum & mag hydroxide-simeth (MAALOX/MYLANTA) 200-200-20 MG/5ML suspension 30 mL  30 mL Oral Q4H PRN Gerome Kokesh B Verena Shawgo, MD      . chlorproMAZINE (THORAZINE) tablet 50 mg  50 mg Oral QID PRN [name], MD   50 mg at 11/13/14 0911  . Darunavir Ethanolate (PREZISTA) tablet 800 mg  800 mg Oral Q breakfast Treyten Monestime B Alisandra Son, MD   800 mg at 11/13/14 0911  . emtricitabine-tenofovir (TRUVADA) 200-300 MG per tablet 1 tablet  1 tablet Oral Daily [name], MD   1 tablet at 11/13/14 0911  . LORazepam (ATIVAN) tablet 2 mg  2 mg Oral Q4H PRN Hildred Priest, MD   2 mg at 11/12/14 1416  . magnesium hydroxide (MILK OF MAGNESIA) suspension 30 mL  30 mL Oral Daily PRN Irja Wheless B Viona Hosking, MD      . pantoprazole (PROTONIX) EC tablet 40 mg  40 mg  Oral Daily Njeri Vicente B Marleena Shubert, MD   40 mg at 11/13/14 0912  . polyethylene glycol (MIRALAX / GLYCOLAX) packet 17 g  17 g Oral Daily Orlie Cundari B Aariyana Manz, MD   17 g at 11/13/14 0913  . raltegravir (ISENTRESS) tablet 400 mg  400 mg Oral BID [name], MD   400 mg at 11/13/14 0911  . risperiDONE (RISPERDAL M-TABS) disintegrating tablet 3 mg  3 mg Oral BID [name], MD   3 mg at 11/13/14 0912  . [START ON 11/19/2014] risperiDONE microspheres (RISPERDAL CONSTA) injection 50 mg  50 mg Intramuscular Q14 Days [name], MD      .  ritonavir (NORVIR) tablet 100 mg  100 mg Oral Q breakfast Skyelar Swigart B Merrel Crabbe, MD   100 mg at 11/13/14 0914  . temazepam (RESTORIL) capsule 15 mg  15 mg Oral QHS [name], MD        Lab Results:  Results for orders placed or performed during the hospital encounter of 11/09/14 (from the past 48 hour(s))  Ammonia     Status: Abnormal   Collection Time: 11/11/14  5:48 PM  Result Value Ref Range   Ammonia 52 (H) 9 - 35 umol/L  Comprehensive metabolic panel     Status: Abnormal   Collection Time: 11/11/14  5:48 PM  Result Value Ref Range   Sodium 135 135 - 145 mmol/L   Potassium 4.7 3.5 - 5.1 mmol/L   Chloride 100 (L) 101 - 111 mmol/L   CO2 25 22 - 32 mmol/L   Glucose, Bld 79 65 - 99 mg/dL   BUN 15 6 - 20 mg/dL   Creatinine, Ser 1.02 (H) 0.44 - 1.00 mg/dL   Calcium 9.2 8.9 - 10.3 mg/dL   Total Protein 7.3 6.5 - 8.1 g/dL   Albumin 3.7 3.5 - 5.0 g/dL   AST 32 15 - 41 U/L   ALT 24 14 - 54 U/L   Alkaline Phosphatase 95 38 - 126 U/L   Total Bilirubin 0.7 0.3 - 1.2 mg/dL   GFR calc non Af Amer >60 >60 mL/min   GFR calc Af Amer >60 >60 mL/min    Comment: (NOTE) The eGFR has been calculated using the CKD EPI equation. This calculation has not been validated in all clinical situations. eGFR's persistently <60 mL/min signify possible Chronic Kidney Disease.    Anion gap 10 5 - 15  Urine culture     Status: None (Preliminary  result)   Collection Time: 11/12/14  9:03 AM  Result Value Ref Range   Specimen Description URINE, RANDOM    Special Requests none Immunocompromised    Culture MULTIPLE SPECIES PRESENT, SUGGEST RECOLLECTION    Report Status PENDING   Urinalysis complete, with microscopic ([facility] only)     Status: Abnormal   Collection Time: 11/12/14  9:03 AM  Result Value Ref Range   Color, Urine YELLOW (A) YELLOW   APPearance CLEAR (A) CLEAR   Glucose, UA NEGATIVE NEGATIVE mg/dL   Bilirubin Urine NEGATIVE NEGATIVE   Ketones, ur NEGATIVE NEGATIVE mg/dL   Specific Gravity, Urine 1.011 1.005 - 1.030   Hgb urine dipstick NEGATIVE NEGATIVE   pH 6.0 5.0 - 8.0   Protein, ur NEGATIVE NEGATIVE mg/dL   Nitrite NEGATIVE NEGATIVE   Leukocytes, UA NEGATIVE NEGATIVE   RBC / HPF 0-5 0 - 5 RBC/hpf   WBC, UA 0-5 0 - 5 WBC/hpf   Bacteria, UA NONE SEEN NONE SEEN   Squamous Epithelial / LPF 0-5 (A) NONE SEEN   Mucous PRESENT   Ammonia     Status: None   Collection Time: 11/13/14 11:38 AM  Result Value Ref Range   Ammonia 30 9 - 35 umol/L    Physical Findings: AIMS: Facial and Oral Movements Muscles of Facial Expression: None, normal Lips and Perioral Area: None, normal Jaw: None, normal Tongue: Minimal,Extremity Movements Upper (arms, wrists, hands, fingers): None, normal Lower (legs, knees, ankles, toes): None, normal, Trunk Movements Neck, shoulders, hips: None, normal, Overall Severity Severity of abnormal movements (highest score from questions above): None, normal Incapacitation due to abnormal movements: None, normal Patient's awareness of abnormal movements (rate only patient's  report): No Awareness, Dental Status Current problems with teeth and/or dentures?: No Does patient usually wear dentures?: No  CIWA:    COWS:     Treatment Plan Summary: Daily contact with patient to assess and evaluate symptoms and progress in treatment and Medication management   Medical Decision Making:  Established  Problem, Stable/Improving (1), Review of Psycho-Social Stressors (1), Review or order clinical lab tests (1), Review of Medication Regimen & Side Effects (2) and Review of New Medication or Change in Dosage (2)   Miss Veazey is a 54 year old female with history of schizoaffective disorder admitting for psychotic break.  0. Agitation. Resolved.    1. Psychosis. The patient was recently discharged from [facility] on a combination of Risperdal Consta 50 mg every 2 weeks next injection 8/11 and oral Risperdal 1 mg at bedtime. We will increase Risperdal to 3 mg twice daily by mouth. We will contact her act team to switch Risperdal Consta to [place].  2. Mood. In addition to psychosis at the patient seems to be manic restless and rule out agitated intrusive disorganized. She has been maintained on Neurontin in the community. This is a new medication. We discontinue Depakote due to high ammonia.   3, HIV. We will continue all antiretrovirals.   4. COPD. She is on inhalers.  5. Anxiety. She is on Vistaril.  6. Constipation. MiraLAX.  7. GERD. We will offer pantoprazole.  8. Insomnia. Restoril is lowered to 15 mg nightly.   9. UA is not suggestive of UTI.   10. Disposition. She will be discharged to home. She will follow up with [facility].     Shantae Vantol 11/13/2014, 2:25 PM

## 2014-11-13 NOTE — Plan of Care (Signed)
Problem: Alteration in thought process Goal: STG-Patient is able to sleep at least 6 hours per night Outcome: Progressing Pt is sleeping well for the past two nights.

## 2014-11-13 NOTE — [facility] Group Notes (Signed)
[facility] LCSW Aftercare Discharge Planning Group Note   11/13/2014 11:43 AM  Participation Quality:  Did not attend.    Serayah Yazdani L Horace Wishon MSW, LCSWA    

## 2014-11-13 NOTE — [facility] Group Notes (Signed)
[facility] Group Notes:  (Nursing/MHT/Case Management/Adjunct)  Date:  11/13/2014  Time:  9:45 PM  Type of Therapy:  Group Therapy  Participation Level:  Active  Participation Quality:  Appropriate, Attentive and Intrusive  Affect:  Appropriate  Cognitive:  Appropriate  Insight:  Appropriate  Engagement in Group:  Engaged  Modes of Intervention:  Support  Summary of Progress/Problems:  [name] 11/13/2014, 9:45 PM

## 2014-11-13 NOTE — Progress Notes (Signed)
D: Pt is awake and active in the milieu this evening. Pt mood is labile and her affect is irritable. Pt is still somewhat confused, her speech is garbled and the content is tangential, although she is redirectable by staff.   A: Writer provided emotional support, redirection and administered medications as prescribed. Writer has attempted to collect urine in a commode hat, but pt moves it despite staff instructions.   R: Pt was intrusive at times, but is ultimately pleasant and cooperative with appropriate medications on board. Pt went to bed following evening snack and medication administration and slept through the night.

## 2014-11-13 NOTE — Progress Notes (Signed)
Recreation Therapy Notes  Date: 08.05.16 Time: 3:00 pm Location: Craft Room  Group Topic: Coping Skills  Goal Area(s) Addresses:  Patient will verbalize an emotion they experienced during group. Patient will verbalize benefit of art.  Behavioral Response: Did not attend  Intervention: [place]  Activity: Patients were given mandalas to color.  Education: LRT educated patients on the benefits of art.  Education Outcome: Patient did not attend group.  Clinical Observations/Feedback: Patient did not attend group.  Jacquelynn Cree, LRT/CTRS 11/13/2014 4:33 PM

## 2014-11-13 NOTE — [facility] Group Notes (Signed)
[facility] Group Notes:  (Nursing/MHT/Case Management/Adjunct)  Date:  11/13/2014  Time:  1:53 AM  Type of Therapy:  Group Therapy  Participation Level:  Active  Participation Quality:  Intrusive  Affect:  Appropriate  Cognitive:  Alert and Disorganized  Insight:  Improving  Engagement in Group:  Engaged  Modes of Intervention:  Discussion  Summary of Progress/Problems:  Benedict Kue Joy Elsi Stelzer 11/13/2014, 1:53 AM

## 2014-11-14 LAB — URINE CULTURE

## 2014-11-14 NOTE — [facility] Group Notes (Signed)
[facility] LCSW Group Therapy  11/14/2014 2:34 PM  Type of Therapy:  Group Therapy  Participation Level:  Active  Participation Quality:  Intrusive  Affect:  Flat  Cognitive:  Disorganized  Insight:  Limited  Engagement in Therapy:  Limited  Modes of Intervention:  Discussion, Education, Socialization and Support  Summary of Progress/Problems: Todays topic: Grudges  Patients will be encouraged to discuss their thoughts, feelings, and behaviors as to why one holds on to grudges and reasons why people have grudges. Patients will process the impact of grudges on their daily lives and identify thoughts and feelings related to holding grudges. Patients will identify feelings and thoughts related to what life would look like without grudges. Basma attended group briefly. She struggled staying on topic. She interrupted other group members but was redirectable. She decided she would like to return to her room after 15-20 minutes.    [employer] MSW, LCSWA  11/14/2014, 2:34 PM

## 2014-11-14 NOTE — Plan of Care (Signed)
Problem: Alteration in thought process Goal: STG-Patient is able to follow short directions Outcome: Progressing Patient has been able to complete task on shift and had been cooperative with no behavioral issues to report on shift.

## 2014-11-14 NOTE — Progress Notes (Signed)
[facility] MD Progress Note  11/14/2014 11:01 AM [name]  MRN:  [id number]  Subjective:  Patient was seen in the day room. Patient has a one-to-one step with her. Any sitting on a wheelchair. The patient appears partially oriented to place and says she is [facility] the [facility]. She says this is the month 8 today 54 of 2016.  Patient denied having any physical complaints. She denies having any side effects from her medications. She denies having auditory or visual hallucinations. UA was negative for any signs of infection. Urine culture was contaminated. Ammonia level was 30. Vital signs are within the normal limits today.  Sleep: good 6 h  Oral intake: good. At least is eating 100% of two her meals.   Per nursing: Patient is alert today to person and place. She is able to follow directions and has been pleasant. Affect blunted but brightens upon approach. Patient remains on 1:1 for safety (fall) reasons. She has been medication compliant and has been eating well. Patient was also observed in day room interacting with peers and sitter. Denies AH at this time. Will cont to monitor for safety.   Principal Problem: Paranoid schizophrenia Diagnosis:   Patient Active Problem List   Diagnosis Date Noted  . Schizophrenia, paranoid type [F20.0] 11/09/2014  . 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 Asthma, chronic [J45.909]   . HIV disease [B20]   . 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 Paranoid schizophrenia [F20.0]   . Psychosis [F29] 10/30/2014  . Psychoses [F29]   . Hyponatremia [E87.1] 10/22/2014  . Microscopic hematuria [R31.2] 01/29/2014  . Abdominal pain [R10.9] 12/12/2013  . Vulvar irritation [N90.89] 12/12/2013  . COPD (chronic obstructive pulmonary disease) [J44.9] 02/18/2013  . Tobacco use disorder [Z72.0] 02/18/2013  . Hepatitis B infection [B16.9]   . Pre-ulcerative corn or callous [L84] 05/07/2012  . Urinary incontinence, nocturnal enuresis [N39.44]  05/07/2012  . Fecal incontinence [R15.9] 05/07/2012  . Systolic murmur [R01.1] 05/07/2012  . Routine health maintenance [Z00.00] 05/07/2012  . Asthma [493] 08/18/2010  . HIV (human immunodeficiency virus infection) [Z21] 08/18/2010  . Schizophrenia [F20.9] 08/18/2010  . Herpes simplex infection [B00.9] 08/18/2010   Total Time spent with patient: 30 minutes   Past Medical History:  Past Medical History  Diagnosis Date  . GERD (gastroesophageal reflux disease)   . Schizophrenia   . AIDS 12-10-2007  . Herpes simplex   . History of cholecystectomy   . Cataract   . Nonspecific reaction to tuberculin skin test without active tuberculosis 11-2008 [facility]   . Seizures   . Asthmatic bronchitis , chronic   . Hepatitis B infection     followed by ID  . Chronic hyponatremia     Past Surgical History  Procedure Laterality Date  . Cholecystectomy    . Leg surgery      right leg, s/p accident   Family History:  Family History  Problem Relation Age of Onset  . Diabetes Sister   . Cancer Sister     breast  . Diabetes Brother   . Hypertension Brother   . Cancer Mother     lung  . Hypertension Mother   . Heart disease Father    Social History:  History  Alcohol Use No    Comment: Patient denies      History  Drug Use No    Comment: Patient denies    History   Social History  . Marital Status: Single  Spouse Name: N/A  . Number of Children: N/A  . Years of Education: N/A   Social History Main Topics  . Smoking status: Current Every Day Smoker -- 2.00 packs/day for 35 years    Types: Cigarettes    Start date: 04/10/1974  . Smokeless tobacco: Never Used     Comment: Previously smoked 2ppd, might start patch  . Alcohol Use: No     Comment: Patient denies   . Drug Use: No     Comment: Patient denies  . Sexual Activity: Not Currently     Comment: pt. declined condoms   Other Topics Concern  . None   Social History Narrative   Additional History:    Sleep:  Good  Appetite:  Fair   Assessment:   Musculoskeletal: Strength & Muscle Tone: within normal limits Gait & Station: normal Patient leans: N/A   Psychiatric Specialty Exam: Physical Exam  Nursing note and vitals reviewed.   Review of Systems  Constitutional: Negative.   HENT: Negative.   Eyes: Negative.   Respiratory: Negative.   Cardiovascular: Negative.   Gastrointestinal: Negative.  Negative for constipation.  Genitourinary: Negative for urgency and frequency.  Skin: Negative.   Neurological: Negative.   Endo/Heme/Allergies: Negative.   Psychiatric/Behavioral: Negative.   All other systems reviewed and are negative.   Blood pressure 97/67, pulse 95, temperature 98.2 F (36.8 C), temperature source Oral, resp. rate 20, height 4\' 11"  (1.499 m), weight 63.504 kg (140 lb), SpO2 98 %.Body mass index is 28.26 kg/(m^2).  General Appearance: Disheveled  Eye Contact::  Minimal  Speech:  Slow  Volume:  Decreased  Mood:  Depressed  Affect:  Flat  Thought Process:  Disorganized  Orientation:  Full (Time, Place, and Person)  Thought Content:  WDL  Suicidal Thoughts:  No  Homicidal Thoughts:  No  Memory:  Immediate;   Poor Recent;   Poor Remote;   Poor  Judgement:  Impaired  Insight:  Lacking  Psychomotor Activity:  Decreased  Concentration:  Fair  Recall:  [employer] of Knowledge:Fair  Language: Fair  Akathisia:  No  Handed:  Right  AIMS (if indicated):     Assets:  Communication Skills Desire for Improvement Social Support  ADL's:  Intact  Cognition: WNL  Sleep:  Number of Hours: 6     Current Medications: Current Facility-Administered Medications  Medication Dose Route Frequency Provider Last Rate Last Dose  . acetaminophen (TYLENOL) tablet 650 mg  650 mg Oral Q6H PRN Jolanta B Pucilowska, MD      . albuterol (PROVENTIL HFA;VENTOLIN HFA) 108 (90 BASE) MCG/ACT inhaler 2 puff  2 puff Inhalation Q4H Shari Prows, MD   2 puff at 11/14/14 0851  . alum  & mag hydroxide-simeth (MAALOX/MYLANTA) 200-200-20 MG/5ML suspension 30 mL  30 mL Oral Q4H PRN Jolanta B Pucilowska, MD      . chlorproMAZINE (THORAZINE) tablet 50 mg  50 mg Oral QID PRN Shari Prows, MD   50 mg at 11/13/14 0911  . Darunavir Ethanolate (PREZISTA) tablet 800 mg  800 mg Oral Q breakfast Jolanta B Pucilowska, MD   800 mg at 11/14/14 0849  . emtricitabine-tenofovir (TRUVADA) 200-300 MG per tablet 1 tablet  1 tablet Oral Daily Jolanta B Pucilowska, MD   1 tablet at 11/14/14 0900  . LORazepam (ATIVAN) tablet 2 mg  2 mg Oral Q4H PRN Jimmy Footman, MD   2 mg at 11/12/14 1416  . magnesium hydroxide (MILK OF MAGNESIA) suspension 30 mL  30 mL Oral Daily PRN Jolanta B Pucilowska, MD      . pantoprazole (PROTONIX) EC tablet 40 mg  40 mg Oral Daily Jolanta B Pucilowska, MD   40 mg at 11/14/14 0900  . polyethylene glycol (MIRALAX / GLYCOLAX) packet 17 g  17 g Oral Daily Shari Prows, MD   17 g at 11/14/14 0903  . raltegravir (ISENTRESS) tablet 400 mg  400 mg Oral BID Shari Prows, MD   400 mg at 11/14/14 0848  . risperiDONE (RISPERDAL M-TABS) disintegrating tablet 3 mg  3 mg Oral BID Jolanta B Pucilowska, MD   3 mg at 11/14/14 0900  . [START ON 11/19/2014] risperiDONE microspheres (RISPERDAL CONSTA) injection 50 mg  50 mg Intramuscular Q14 Days Jolanta B Pucilowska, MD      . ritonavir (NORVIR) tablet 100 mg  100 mg Oral Q breakfast Jolanta B Pucilowska, MD   100 mg at 11/14/14 0848  . temazepam (RESTORIL) capsule 15 mg  15 mg Oral QHS Shari Prows, MD   15 mg at 11/13/14 2125    Lab Results:  Results for orders placed or performed during the hospital encounter of 11/09/14 (from the past 48 hour(s))  Ammonia     Status: None   Collection Time: 11/13/14 11:38 AM  Result Value Ref Range   Ammonia 30 9 - 35 umol/L    Physical Findings: AIMS: Facial and Oral Movements Muscles of Facial Expression: None, normal Lips and Perioral Area: None,  normal Jaw: None, normal Tongue: Minimal,Extremity Movements Upper (arms, wrists, hands, fingers): None, normal Lower (legs, knees, ankles, toes): None, normal, Trunk Movements Neck, shoulders, hips: None, normal, Overall Severity Severity of abnormal movements (highest score from questions above): None, normal Incapacitation due to abnormal movements: None, normal Patient's awareness of abnormal movements (rate only patient's report): No Awareness, Dental Status Current problems with teeth and/or dentures?: No Does patient usually wear dentures?: No  CIWA:    COWS:     Treatment Plan Summary: Daily contact with patient to assess and evaluate symptoms and progress in treatment and Medication management   Medical Decision Making:  Established Problem, Stable/Improving (1), Review of Psycho-Social Stressors (1), Review or order clinical lab tests (1), Review of Medication Regimen & Side Effects (2) and Review of New Medication or Change in Dosage (2)   Miss Sciara is a 54 year old female with history of schizoaffective disorder admitting for psychotic break.  0. Agitation. Resolved.    1. Psychosis. The patient was recently discharged from [facility] on a combination of Risperdal Consta 50 mg every 2 weeks next injection 8/11 and oral Risperdal 1 mg at bedtime. We will increase Risperdal to 3 mg twice daily by mouth. We will contact her act team to switch Risperdal Consta to [name].  2. Mood. In addition to psychosis at the patient seems to be manic restless and rule out agitated intrusive disorganized. She has been maintained on Neurontin in the community. This is a new medication. We discontinue Depakote due to high ammonia.   3, HIV. We will continue all antiretrovirals.   4. COPD. She is on inhalers.  5. Anxiety. She is on Vistaril.  6. Constipation. MiraLAX.  7. GERD. We will offer pantoprazole.  8. Insomnia. Restoril is lowered to 15 mg nightly.   9. UA  is not suggestive of UTI.   10. Disposition. She will be discharged to home. She will [facility] with [facility].  Jimmy Footman 11/14/2014, 11:01 AM

## 2014-11-14 NOTE — Progress Notes (Signed)
Patient has been able to complete task on shift and had been cooperative with no behavioral issues to report on shift. She took medication on shift without issue. She has a Comptroller at the bed side at al time for risk for falls. No hallucinations or behavioral issues to report on shift. She appears to be sleep and resting in bed at this time.

## 2014-11-14 NOTE — Progress Notes (Signed)
Pt has been pleasant and cooperative this period. Pt continues to have a 1:1 sitter for safety. Pt needs a lot of redirecting at times. Pt denies having any c/o discomfort. Pt denies SI and A/V hallucinations. Pt has been active on the unit interacting with both staff and peers. Pt' appetite has been good.

## 2014-11-15 MED ORDER — ALUM & MAG HYDROXIDE-SIMETH 200-200-20 MG/5ML PO SUSP: 30.0000 mL | Freq: Three times a day (TID) | ORAL | Status: DC

## 2014-11-15 MED ORDER — CALCIUM CARBONATE ANTACID 500 MG PO CHEW
1.0000 | CHEWABLE_TABLET | Freq: Three times a day (TID) | ORAL | Status: DC
  Administered 2014-11-15 – 2014-11-24 (×27): 200 mg via ORAL
  Filled 2014-11-15 (×26): qty 1

## 2014-11-15 NOTE — [facility] Group Notes (Signed)
[facility] LCSW Group Therapy  11/15/2014 12:49 PM  Type of Therapy:  Group Therapy  Participation Level:  Minimal  Participation Quality:  Intrusive  Affect:  Flat  Cognitive:  Disorganized  Insight:  Limited  Engagement in Therapy:  Limited  Modes of Intervention:  Discussion, Education, Socialization and Support  Summary of Progress/Problems: Balance in life: Patients will discuss the concept of balance and how it looks and feels to be unbalanced. Pt will identify areas in their life that is unbalanced and ways to become more balanced.  [name] attended group briefly.   [employer] MSW, LCSWA  11/15/2014, 12:49 PM

## 2014-11-15 NOTE — Plan of Care (Signed)
Problem: Alteration in thought process Goal: LTG-Patient has not harmed self or others in at least 2 days Outcome: Progressing Patient has not had any aggressive behavior towards others in the last two days.

## 2014-11-15 NOTE — Progress Notes (Signed)
[facility] MD Progress Note  11/15/2014 9:41 AM [name]  MRN:  [id number]  Subjective:  Patient was seen in her room she is in the company of a one-to-one sitter.  Patient was more confused today and disorganized she talked about polar bears and the zoo.  Her major complaints is just today has been issues with her stomach. The cedar that stays with her tells me that the patient has been eating large amounts of food and she believes she might be having indigestion. There is no evidence of nausea, vomiting, constipation or diarrhea. The patient uncover her abdomen and showed me some old scars and states that she thinks the staples and sutures are still in there. There is no evidence of any abnormality based on my observation of the scar.  Sleep: good 9 h  Oral intake: good. Ate 90-100% of  All meals yesterday.   Per nursing: Patient has required some redirection on shift , she spent most of the evening in the dayroom talking with peers, she has been cooperative with no behavioral issues to report on shift. She took medication on shift without issues. She has a Comptroller at the bed side at all time for risk for falls. Patient still having some hallucinations on shift. Given Ativan x1 PRN for anxiety with effect. She appears to be sleep and resting in bed at this time.  Principal Problem: Paranoid schizophrenia Diagnosis:   Patient Active Problem List   Diagnosis Date Noted  . Schizophrenia, paranoid type [F20.0] 11/09/2014  . 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 Asthma, chronic [J45.909]   . HIV disease [B20]   . 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 Paranoid schizophrenia [F20.0]   . Psychosis [F29] 10/30/2014  . Psychoses [F29]   . Hyponatremia [E87.1] 10/22/2014  . Microscopic hematuria [R31.2] 01/29/2014  . Abdominal pain [R10.9] 12/12/2013  . Vulvar irritation [N90.89] 12/12/2013  . COPD (chronic obstructive pulmonary disease) [J44.9] 02/18/2013  . Tobacco use  disorder [Z72.0] 02/18/2013  . Hepatitis B infection [B16.9]   . Pre-ulcerative corn or callous [L84] 05/07/2012  . Urinary incontinence, nocturnal enuresis [N39.44] 05/07/2012  . Fecal incontinence [R15.9] 05/07/2012  . Systolic murmur [R01.1] 05/07/2012  . Routine health maintenance [Z00.00] 05/07/2012  . Asthma [493] 08/18/2010  . HIV (human immunodeficiency virus infection) [Z21] 08/18/2010  . Schizophrenia [F20.9] 08/18/2010  . Herpes simplex infection [B00.9] 08/18/2010   Total Time spent with patient: 30 minutes   Past Medical History:  Past Medical History  Diagnosis Date  . GERD (gastroesophageal reflux disease)   . Schizophrenia   . AIDS 12-10-2007  . Herpes simplex   . History of cholecystectomy   . Cataract   . Nonspecific reaction to tuberculin skin test without active tuberculosis 11-2008 [facility]   . Seizures   . Asthmatic bronchitis , chronic   . Hepatitis B infection     followed by ID  . Chronic hyponatremia     Past Surgical History  Procedure Laterality Date  . Cholecystectomy    . Leg surgery      right leg, s/p accident   Family History:  Family History  Problem Relation Age of Onset  . Diabetes Sister   . Cancer Sister     breast  . Diabetes Brother   . Hypertension Brother   . Cancer Mother     lung  . Hypertension Mother   . Heart disease Father    Social History:  History  Alcohol  Use No    Comment: Patient denies      History  Drug Use No    Comment: Patient denies    History   Social History  . Marital Status: Single    Spouse Name: N/A  . Number of Children: N/A  . Years of Education: N/A   Social History Main Topics  . Smoking status: Current Every Day Smoker -- 2.00 packs/day for 35 years    Types: Cigarettes    Start date: 04/10/1974  . Smokeless tobacco: Never Used     Comment: Previously smoked 2ppd, might start patch  . Alcohol Use: No     Comment: Patient denies   . Drug Use: No     Comment: Patient denies  .  Sexual Activity: Not Currently     Comment: pt. declined condoms   Other Topics Concern  . None   Social History Narrative   Additional History:    Sleep: Good  Appetite:  Fair   Assessment:   Musculoskeletal: Strength & Muscle Tone: within normal limits Gait & Station: normal Patient leans: N/A   Psychiatric Specialty Exam: Physical Exam  Nursing note and vitals reviewed.   Review of Systems  Constitutional: Negative.   HENT: Negative.   Eyes: Negative.   Respiratory: Negative.   Cardiovascular: Negative.   Gastrointestinal: Positive for heartburn.  Genitourinary: Negative for urgency and frequency.  Musculoskeletal: Negative.   Skin: Negative.   Neurological: Negative.   Endo/Heme/Allergies: Negative.   Psychiatric/Behavioral: Negative.   All other systems reviewed and are negative.   Blood pressure 108/64, pulse 73, temperature 98.2 F (36.8 C), temperature source Oral, resp. rate 20, height 4\' 11"  (1.499 m), weight 63.504 kg (140 lb), SpO2 98 %.Body mass index is 28.26 kg/(m^2).  General Appearance: Disheveled  Eye Contact::  Minimal  Speech:  Slow  Volume:  Decreased  Mood:  Depressed  Affect:  Flat  Thought Process:  Disorganized  Orientation:  Full (Time, Place, and Person)  Thought Content:  WDL  Suicidal Thoughts:  No  Homicidal Thoughts:  No  Memory:  Immediate;   Poor Recent;   Poor Remote;   Poor  Judgement:  Impaired  Insight:  Lacking  Psychomotor Activity:  Decreased  Concentration:  Fair  Recall:  [employer] of Knowledge:Fair  Language: Fair  Akathisia:  No  Handed:  Right  AIMS (if indicated):     Assets:  Communication Skills Desire for Improvement Social Support  ADL's:  Intact  Cognition: WNL  Sleep:  Number of Hours: 9     Current Medications: Current Facility-Administered Medications  Medication Dose Route Frequency Provider Last Rate Last Dose  . acetaminophen (TYLENOL) tablet 650 mg  650 mg Oral Q6H PRN Shari Prows, MD   650 mg at 11/14/14 2128  . albuterol (PROVENTIL HFA;VENTOLIN HFA) 108 (90 BASE) MCG/ACT inhaler 2 puff  2 puff Inhalation Q4H Shari Prows, MD   2 puff at 11/15/14 0836  . alum & mag hydroxide-simeth (MAALOX/MYLANTA) 200-200-20 MG/5ML suspension 30 mL  30 mL Oral Q4H PRN Jolanta B Pucilowska, MD      . chlorproMAZINE (THORAZINE) tablet 50 mg  50 mg Oral QID PRN Shari Prows, MD   50 mg at 11/13/14 0911  . Darunavir Ethanolate (PREZISTA) tablet 800 mg  800 mg Oral Q breakfast Jolanta B Pucilowska, MD   800 mg at 11/15/14 0837  . emtricitabine-tenofovir (TRUVADA) 200-300 MG per tablet 1 tablet  1 tablet Oral  Daily Shari Prows, MD   1 tablet at 11/15/14 0837  . LORazepam (ATIVAN) tablet 2 mg  2 mg Oral Q4H PRN Jimmy Footman, MD   2 mg at 11/14/14 2359  . magnesium hydroxide (MILK OF MAGNESIA) suspension 30 mL  30 mL Oral Daily PRN Jolanta B Pucilowska, MD      . pantoprazole (PROTONIX) EC tablet 40 mg  40 mg Oral Daily Jolanta B Pucilowska, MD   40 mg at 11/15/14 0837  . polyethylene glycol (MIRALAX / GLYCOLAX) packet 17 g  17 g Oral Daily Shari Prows, MD   17 g at 11/14/14 0903  . raltegravir (ISENTRESS) tablet 400 mg  400 mg Oral BID Shari Prows, MD   400 mg at 11/15/14 0836  . risperiDONE (RISPERDAL M-TABS) disintegrating tablet 3 mg  3 mg Oral BID Shari Prows, MD   3 mg at 11/15/14 0837  . [START ON 11/19/2014] risperiDONE microspheres (RISPERDAL CONSTA) injection 50 mg  50 mg Intramuscular Q14 Days Jolanta B Pucilowska, MD      . ritonavir (NORVIR) tablet 100 mg  100 mg Oral Q breakfast Jolanta B Pucilowska, MD   100 mg at 11/15/14 0836  . temazepam (RESTORIL) capsule 15 mg  15 mg Oral QHS Shari Prows, MD   15 mg at 11/14/14 2126    Lab Results:  Results for orders placed or performed during the hospital encounter of 11/09/14 (from the past 48 hour(s))  Ammonia     Status: None   Collection Time: 11/13/14  11:38 AM  Result Value Ref Range   Ammonia 30 9 - 35 umol/L    Physical Findings: AIMS: Facial and Oral Movements Muscles of Facial Expression: None, normal Lips and Perioral Area: None, normal Jaw: None, normal Tongue: Minimal,Extremity Movements Upper (arms, wrists, hands, fingers): None, normal Lower (legs, knees, ankles, toes): None, normal, Trunk Movements Neck, shoulders, hips: None, normal, Overall Severity Severity of abnormal movements (highest score from questions above): None, normal Incapacitation due to abnormal movements: None, normal Patient's awareness of abnormal movements (rate only patient's report): No Awareness, Dental Status Current problems with teeth and/or dentures?: No Does patient usually wear dentures?: No  CIWA:    COWS:     Treatment Plan Summary: Daily contact with patient to assess and evaluate symptoms and progress in treatment and Medication management   Medical Decision Making:  Established Problem, Stable/Improving (1), Review of Psycho-Social Stressors (1), Review or order clinical lab tests (1), Review of Medication Regimen & Side Effects (2) and Review of New Medication or Change in Dosage (2)   Miss Dohn is a 54 year old female with history of schizoaffective disorder admitting for psychotic break.  0. Agitation. Resolved.    1. Psychosis. The patient was recently discharged from [facility] on a combination of Risperdal Consta 50 mg every 2 weeks next injection 8/11 and oral Risperdal 1 mg at bedtime. We will increase Risperdal to 3 mg twice daily by mouth. We will contact her act team to switch Risperdal Consta to [name].  2. Mood. In addition to psychosis at the patient seems to be manic restless and rule out agitated intrusive disorganized. She has been maintained on Neurontin in the community. This is a new medication. We discontinue Depakote due to high ammonia.   3, HIV. We will continue all antiretrovirals.   4.  COPD. She is on inhalers.  5. Anxiety. She is on Vistaril.  6. Constipation. MiraLAX.  7. GERD.  We will offer pantoprazole.  8. Insomnia. Restoril is lowered to 15 mg nightly.   9. UA is not suggestive of UTI.   10. Disposition. She will be discharged to home. She will [facility] with [facility].  For indigestion I will order Maalox after each meal.    Jimmy Footman 11/15/2014, 9:41 AM

## 2014-11-15 NOTE — Progress Notes (Signed)
Patient is alert and oriented. Her mood is elevated with wide affect. Patient frequently laughs very loudly. Denies SI or HI. No gross psychosis noted but patient does requires some reality orientation throughout shift. She is still verbally intrusive at times but more responsive to redirection. Speech is still somewhat rapid and thoughts still showing some illogical thinking but getting more organized.  Will continue current treatment plan, monitor response to medication. Patient on 1:1 with sitter for risk of impulsive behavior.             

## 2014-11-15 NOTE — Progress Notes (Signed)
Patient has required some redirection on shift , she spent most of the evening in the dayroom talking with peers, she has been cooperative with no behavioral issues to report on shift. She took medication on shift without issues. She has a Comptroller at the bed side at all time for risk for falls. Patient still having some hallucinations on shift. Given Ativan x1 PRN for anxiety with effect. She appears to be sleep and resting in bed at this time.

## 2014-11-15 NOTE — [facility] Group Notes (Signed)
[facility] Group Notes:  (Nursing/MHT/Case Management/Adjunct)  Date:  11/15/2014  Time:  3:37 AM  Type of Therapy:  Group Therapy  Participation Level:  Active  Participation Quality:  Appropriate  Affect:  Appropriate  Cognitive:  Appropriate  Insight:  Appropriate  Engagement in Group:  Engaged  Modes of Intervention:  Discussion  Summary of Progress/Problems:  [name] [name] 11/15/2014, 3:37 AM

## 2014-11-15 NOTE — [facility] Group Notes (Signed)
[facility] Group Notes:  (Nursing/MHT/Case Management/Adjunct)  Date:  11/15/2014  Time:  9:45 AM  Type of Therapy:  Psychoeducational Skills  Participation Level:  Minimal  Participation Quality:  Attentive  Affect:  Appropriate and Labile  Cognitive:  Alert, Disorganized and Lacking  Insight:  Improving  Engagement in Group:  Lacking  Modes of Intervention:  Discussion, Education and Exploration  Summary of Progress/Problems:  [name] 11/15/2014, 9:45 AM

## 2014-11-16 NOTE — Progress Notes (Signed)
IS 1:1 with MHT sitter. Mumbles incoherently at times. Appears disheveled. Has moments of laughing and crying at inappropriate times. Has periods of paranoia and delusions. Was heard screaming from room down hallway. Reminded that screaming was disruptive to others trying to sleep. IS easily redirected. Sitter remains in place for impulsive behavior.

## 2014-11-16 NOTE — Progress Notes (Signed)
[facility] MD Progress Note  11/16/2014 11:54 AM [name]  MRN:  [id number]  Subjective:  [name] seems much better today. She is out of bed showered and nicely dressed. She wants to go to groups and spent some time in the dayroom. She was appropriate with peers and staff. She had breakfast this morning. As she has all materials for today ready. Her sister tells me that she is easily directable. At the patient is still delusional she thinks that she needs to be adopted because her mother passed away. She wants to go to group to tell believed that she will be absent. Her affect is a little on the. Yesterday she complained of abdominal pain and vomited some. The staff this was most likely due to overeating. She had a bowel movement this morning and has no physical complaints. She seems to tolerate medications well.  Principal Problem: Paranoid schizophrenia Diagnosis:   Patient Active Problem List   Diagnosis Date Noted  . Schizophrenia, paranoid type [F20.0] 11/09/2014  . 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 Asthma, chronic [J45.909]   . HIV disease [B20]   . 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 Paranoid schizophrenia [F20.0]   . Psychosis [F29] 10/30/2014  . Psychoses [F29]   . Hyponatremia [E87.1] 10/22/2014  . Microscopic hematuria [R31.2] 01/29/2014  . Abdominal pain [R10.9] 12/12/2013  . Vulvar irritation [N90.89] 12/12/2013  . COPD (chronic obstructive pulmonary disease) [J44.9] 02/18/2013  . Tobacco use disorder [Z72.0] 02/18/2013  . Hepatitis B infection [B16.9]   . Pre-ulcerative corn or callous [L84] 05/07/2012  . Urinary incontinence, nocturnal enuresis [N39.44] 05/07/2012  . Fecal incontinence [R15.9] 05/07/2012  . Systolic murmur [R01.1] 05/07/2012  . Routine health maintenance [Z00.00] 05/07/2012  . Asthma [493] 08/18/2010  . HIV (human immunodeficiency virus infection) [Z21] 08/18/2010  . Schizophrenia [F20.9] 08/18/2010  . Herpes simplex  infection [B00.9] 08/18/2010   Total Time spent with patient: 20 minutes   Past Medical History:  Past Medical History  Diagnosis Date  . GERD (gastroesophageal reflux disease)   . Schizophrenia   . AIDS 12-10-2007  . Herpes simplex   . History of cholecystectomy   . Cataract   . Nonspecific reaction to tuberculin skin test without active tuberculosis 11-2008 [facility]   . Seizures   . Asthmatic bronchitis , chronic   . Hepatitis B infection     followed by ID  . Chronic hyponatremia     Past Surgical History  Procedure Laterality Date  . Cholecystectomy    . Leg surgery      right leg, s/p accident   Family History:  Family History  Problem Relation Age of Onset  . Diabetes Sister   . Cancer Sister     breast  . Diabetes Brother   . Hypertension Brother   . Cancer Mother     lung  . Hypertension Mother   . Heart disease Father    Social History:  History  Alcohol Use No    Comment: Patient denies      History  Drug Use No    Comment: Patient denies    History   Social History  . Marital Status: Single    Spouse Name: N/A  . Number of Children: N/A  . Years of Education: N/A   Social History Main Topics  . Smoking status: Current Every Day Smoker -- 2.00 packs/day for 35 years    Types: Cigarettes    Start date: 04/10/1974  .  Smokeless tobacco: Never Used     Comment: Previously smoked 2ppd, might start patch  . Alcohol Use: No     Comment: Patient denies   . Drug Use: No     Comment: Patient denies  . Sexual Activity: Not Currently     Comment: pt. declined condoms   Other Topics Concern  . None   Social History Narrative   Additional History:    Sleep: Good  Appetite:  Good   Assessment:   Musculoskeletal: Strength & Muscle Tone: within normal limits Gait & Station: normal Patient leans: N/A   Psychiatric Specialty Exam: Physical Exam  Nursing note and vitals reviewed.   Review of Systems  Gastrointestinal: Positive for  vomiting.  All other systems reviewed and are negative.   Blood pressure 108/64, pulse 73, temperature 98.2 F (36.8 C), temperature source Oral, resp. rate 20, height 4\' 11"  (1.499 m), weight 63.504 kg (140 lb), SpO2 98 %.Body mass index is 28.26 kg/(m^2).  General Appearance: Casual  Eye Contact::  Good  Speech:  Clear and Coherent  Volume:  Normal  Mood:  Euphoric  Affect:  Labile  Thought Process:  Disorganized  Orientation:  Full (Time, Place, and Person)  Thought Content:  Delusions and Paranoid Ideation  Suicidal Thoughts:  No  Homicidal Thoughts:  No  Memory:  Immediate;   Poor Recent;   Poor Remote;   Poor  Judgement:  Poor  Insight:  Lacking  Psychomotor Activity:  Normal  Concentration:  Poor  Recall:  Poor  Fund of Knowledge:Fair  Language: Fair  Akathisia:  No  Handed:  Right  AIMS (if indicated):     Assets:  Communication Skills Desire for Improvement Financial Resources/Insurance  ADL's:  Intact  Cognition: WNL  Sleep:  Number of Hours: 7.5     Current Medications: Current Facility-Administered Medications  Medication Dose Route Frequency Provider Last Rate Last Dose  . acetaminophen (TYLENOL) tablet 650 mg  650 mg Oral Q6H PRN Shari Prows, MD   650 mg at 11/14/14 2128  . albuterol (PROVENTIL HFA;VENTOLIN HFA) 108 (90 BASE) MCG/ACT inhaler 2 puff  2 puff Inhalation Q4H Shari Prows, MD   2 puff at 11/16/14 0753  . calcium carbonate (TUMS - dosed in mg elemental calcium) chewable tablet 200 mg of elemental calcium  1 tablet Oral TID WC Jimmy Footman, MD   200 mg of elemental calcium at 11/16/14 0755  . chlorproMAZINE (THORAZINE) tablet 50 mg  50 mg Oral QID PRN Shari Prows, MD   50 mg at 11/13/14 0911  . Darunavir Ethanolate (PREZISTA) tablet 800 mg  800 mg Oral Q breakfast Jolanta B Pucilowska, MD   800 mg at 11/16/14 0755  . emtricitabine-tenofovir (TRUVADA) 200-300 MG per tablet 1 tablet  1 tablet Oral Daily Shari Prows, MD   1 tablet at 11/16/14 0755  . LORazepam (ATIVAN) tablet 2 mg  2 mg Oral Q4H PRN Jimmy Footman, MD   2 mg at 11/15/14 2246  . magnesium hydroxide (MILK OF MAGNESIA) suspension 30 mL  30 mL Oral Daily PRN Jolanta B Pucilowska, MD      . pantoprazole (PROTONIX) EC tablet 40 mg  40 mg Oral Daily Jolanta B Pucilowska, MD   40 mg at 11/16/14 0756  . polyethylene glycol (MIRALAX / GLYCOLAX) packet 17 g  17 g Oral Daily Jolanta B Pucilowska, MD   17 g at 11/16/14 0756  . raltegravir (ISENTRESS) tablet 400 mg  400 mg  Oral BID Shari Prows, MD   400 mg at 11/16/14 0755  . risperiDONE (RISPERDAL M-TABS) disintegrating tablet 3 mg  3 mg Oral BID Shari Prows, MD   3 mg at 11/16/14 0756  . [START ON 11/19/2014] risperiDONE microspheres (RISPERDAL CONSTA) injection 50 mg  50 mg Intramuscular Q14 Days Jolanta B Pucilowska, MD      . ritonavir (NORVIR) tablet 100 mg  100 mg Oral Q breakfast Jolanta B Pucilowska, MD   100 mg at 11/16/14 0756  . temazepam (RESTORIL) capsule 15 mg  15 mg Oral QHS Shari Prows, MD   15 mg at 11/15/14 2127    Lab Results: No results found for this or any previous visit (from the past 48 hour(s)).  Physical Findings: AIMS: Facial and Oral Movements Muscles of Facial Expression: None, normal Lips and Perioral Area: None, normal Jaw: None, normal Tongue: Minimal,Extremity Movements Upper (arms, wrists, hands, fingers): None, normal Lower (legs, knees, ankles, toes): None, normal, Trunk Movements Neck, shoulders, hips: None, normal, Overall Severity Severity of abnormal movements (highest score from questions above): None, normal Incapacitation due to abnormal movements: None, normal Patient's awareness of abnormal movements (rate only patient's report): No Awareness, Dental Status Current problems with teeth and/or dentures?: No Does patient usually wear dentures?: No  CIWA:    COWS:     Treatment Plan Summary: Daily  contact with patient to assess and evaluate symptoms and progress in treatment and Medication management   Medical Decision Making:  Established Problem, Stable/Improving (1), Review of Psycho-Social Stressors (1), Review or order clinical lab tests (1), Review of Medication Regimen & Side Effects (2) and Review of New Medication or Change in Dosage (2)   Miss Syler is a 54 year old female with history of schizoaffective disorder admitting for psychotic break.  0. Agitation. Resolved.    1. Psychosis. The patient was recently discharged from [facility] on a combination of Risperdal Consta 50 mg every 2 weeks next injection 8/11 and oral Risperdal 1 mg at bedtime. We will increase Risperdal to 3 mg twice daily by mouth. We will contact her act team to switch Risperdal Consta to [name].  2. Mood. In addition to psychosis at the patient on admission appeared manic restless and rule out agitated intrusive disorganized. She was continued on Neurontin. Westarted Depakote but had to  discontinue due to high ammonia that has normalized by now.  3, HIV. We will continue all antiretrovirals.   4. COPD. She is on inhalers.  5. Anxiety. She is on Vistaril.  6. Constipation. MiraLAX.  7. GERD. We will offer pantoprazole.  8. Insomnia. Restoril is lowered to 15 mg nightly.   9. UA is not suggestive of UTI.   10. Disposition. She will be discharged to home. She will [facility].       Jolanta Pucilowska 11/16/2014, 11:54 AM

## 2014-11-16 NOTE — Progress Notes (Signed)
Recreation Therapy Notes  Date: 08.08.16 Time: 3:00 pm Location: Craft Room  Group Topic: Self-expression  Goal Area(s) Addresses:  Patient will be able to identify a color that represents each emotion. Patient will verbalize benefit of using art as a means of self-expression. Patient will verbalize one positive emotion experienced while participating in activity.  Behavioral Response: Did not attend  Intervention: The Colors Within Me  Activity: Patients were given a blank face worksheet and instructed to think of the emotions they were experiencing, pick a color for that emotion, and show how much of that emotion they are experiencing on the worksheet.   Education: LRT educated patients on different forms of self-expression.  Education Outcome: Patient did not attend group.  Clinical Observations/Feedback: Patient did not attend group.  Evren Shankland M, LRT/CTRS 11/16/2014 4:11 PM 

## 2014-11-16 NOTE — [facility] Group Notes (Signed)
[facility] Group Notes:  (Nursing/MHT/Case Management/Adjunct)  Date:  11/16/2014  Time:  2:55 PM  Type of Therapy:  Psychoeducational Skills  Participation Level:  Active  Participation Quality:  Redirectable and Sharing  Affect:  Not Congruent  Cognitive:  Disorganized and Lacking  Insight:  Lacking  Engagement in Group:  Engaged and Limited  Modes of Intervention:  Discussion, Education and Support  Summary of Progress/Problems:  [name] 11/16/2014, 2:55 PM

## 2014-11-16 NOTE — [facility] Group Notes (Signed)
[facility] LCSW Group Therapy  11/16/2014 2:49 PM  Type of Therapy:  Group Therapy  Participation Level:  Did Not Attend  Participation Quality:    Affect:    Cognitive:    Insight:    Engagement in Therapy:    Modes of Intervention:    Summary of Progress/Problems:  [name] 11/16/2014, 2:49 PM

## 2014-11-16 NOTE — [facility] Group Notes (Signed)
[facility] Group Notes:  (Nursing/MHT/Case Management/Adjunct)  Date:  11/16/2014  Time:  11:17 PM  Type of Therapy:  Evening Wrap-up Group/Outside Activity  Participation Level:  Active  Participation Quality:  Appropriate  Affect:  Excited  Cognitive:  Alert  Insight:  Lacking  Engagement in Group:  Engaged  Modes of Intervention:  Activity  Summary of Progress/Problems: Patient very excited during group, observed singing very loudly but could not give insight into goals or plan of care.  Naomi Fitton Nanta Darlen Gledhill 11/16/2014, 11:17 PM

## 2014-11-16 NOTE — [facility] Group Notes (Addendum)
[facility] LCSW Aftercare Discharge Planning Group Note  11/16/2014 10:42 AM  Participation Quality:  DID NOT ATTEND  Affect:    Cognitive:    Insight:    Engagement in Group:    Modes of Intervention:    Summary of Progress/Problems:  [name] 11/16/2014, 10:42 AM

## 2014-11-16 NOTE — Progress Notes (Signed)
Patient is alert and oriented. Her mood is elevated with wide affect. Patient frequently laughs very loudly. Denies SI or HI. No gross psychosis noted but patient does requires some reality orientation throughout shift. She is still verbally intrusive at times but more responsive to redirection. Speech is still somewhat rapid and thoughts still showing some illogical thinking but getting more organized.  Will continue current treatment plan, monitor response to medication. Patient on 1:1 with sitter for risk of impulsive behavior.

## 2014-11-17 MED ORDER — PALIPERIDONE PALMITATE 156 MG/ML IM SUSP
156.0000 mg | Freq: Once | INTRAMUSCULAR | Status: AC
  Administered 2014-11-21: 156 mg via INTRAMUSCULAR
  Filled 2014-11-17: qty 1

## 2014-11-17 MED ORDER — PALIPERIDONE PALMITATE 234 MG/1.5ML IM SUSP
234.0000 mg | Freq: Once | INTRAMUSCULAR | Status: AC
  Administered 2014-11-17: 234 mg via INTRAMUSCULAR
  Filled 2014-11-17: qty 1.5

## 2014-11-17 MED ORDER — PALIPERIDONE PALMITATE 234 MG/1.5ML IM SUSP: 234.0000 mg | Freq: Once | INTRAMUSCULAR | Status: DC

## 2014-11-17 NOTE — Progress Notes (Signed)
D:  SI/ HI - contracts for safety. Pt denies AVH. Pt is labile argumentative, and verbally aggressive. Pt is very irritable towards staff, pt very labile with Clinical research associate. Pt appears delusional at times. Pt was given a pen to write in her journal, then she began to mark on her abdomen. Pt needs constant re-direction to maintain control.   A: Pt was offered support and encouragement. Pt was given scheduled medications. Pt was encourage to attend groups. Q 15 minute checks were done for safety.   R:. Pt is taking medication. Pt receptive to treatment and safety maintained on unit.

## 2014-11-17 NOTE — Plan of Care (Signed)
Problem: Alteration in thought process Goal: STG-Patient is able to follow short directions Outcome: Not Progressing Patient remains on one to one with staff. Patient disorganized in thinking and behavior. Needs staff for support and directions. Poor hygiene unless staff supports on unit.

## 2014-11-17 NOTE — Progress Notes (Signed)
[facility] MD Progress Note  11/17/2014 4:55 PM DAE ANTONUCCI  MRN:  [id number]  Subjective:  Ms. Febres is loud, argumentative, difficult to redirect, intrusive and utterly confused. She has Comptroller. She takes medications as prescribed and seems to tolerate them well. Yesterday she seemed pleasant even though confused. Today her behavior has been escalating throughout the day. Last week she had an episode of agitation during the night that's required multiple doses of medications. 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 We do not know her baseline. We were not able to get in touch with her family.. We will contact her ACT team. I will start Invega Sustenna injections instead of Risperdal Consta.  Principal Problem: Paranoid schizophrenia Diagnosis:   Patient Active Problem List   Diagnosis Date Noted  . Schizophrenia, paranoid type [F20.0] 11/09/2014  . 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 Asthma, chronic [J45.909]   . HIV disease [B20]   . 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 Paranoid schizophrenia [F20.0]   . Psychosis [F29] 10/30/2014  . Psychoses [F29]   . Hyponatremia [E87.1] 10/22/2014  . Microscopic hematuria [R31.2] 01/29/2014  . Abdominal pain [R10.9] 12/12/2013  . Vulvar irritation [N90.89] 12/12/2013  . COPD (chronic obstructive pulmonary disease) [J44.9] 02/18/2013  . Tobacco use disorder [Z72.0] 02/18/2013  . Hepatitis B infection [B16.9]   . Pre-ulcerative corn or callous [L84] 05/07/2012  . Urinary incontinence, nocturnal enuresis [N39.44] 05/07/2012  . Fecal incontinence [R15.9] 05/07/2012  . Systolic murmur [R01.1] 05/07/2012  . Routine health maintenance [Z00.00] 05/07/2012  . Asthma [493] 08/18/2010  . HIV (human immunodeficiency virus infection) [Z21] 08/18/2010  . Schizophrenia [F20.9] 08/18/2010  . Herpes simplex infection [B00.9] 08/18/2010   Total Time spent with patient: 20 minutes   Past Medical History:  Past  Medical History  Diagnosis Date  . GERD (gastroesophageal reflux disease)   . Schizophrenia   . AIDS 12-10-2007  . Herpes simplex   . History of cholecystectomy   . Cataract   . Nonspecific reaction to tuberculin skin test without active tuberculosis 11-2008 [facility]   . Seizures   . Asthmatic bronchitis , chronic   . Hepatitis B infection     followed by ID  . Chronic hyponatremia     Past Surgical History  Procedure Laterality Date  . Cholecystectomy    . Leg surgery      right leg, s/p accident   Family History:  Family History  Problem Relation Age of Onset  . Diabetes Sister   . Cancer Sister     breast  . Diabetes Brother   . Hypertension Brother   . Cancer Mother     lung  . Hypertension Mother   . Heart disease Father    Social History:  History  Alcohol Use No    Comment: Patient denies      History  Drug Use No    Comment: Patient denies    History   Social History  . Marital Status: Single    Spouse Name: N/A  . Number of Children: N/A  . Years of Education: N/A   Social History Main Topics  . Smoking status: Current Every Day Smoker -- 2.00 packs/day for 35 years    Types: Cigarettes    Start date: 04/10/1974  . Smokeless tobacco: Never Used     Comment: Previously smoked 2ppd, might start patch  . Alcohol Use: No     Comment: Patient  denies   . Drug Use: No     Comment: Patient denies  . Sexual Activity: Not Currently     Comment: pt. declined condoms   Other Topics Concern  . None   Social History Narrative   Additional History:    Sleep: Fair  Appetite:  Fair   Assessment:   Musculoskeletal: Strength & Muscle Tone: within normal limits Gait & Station: normal Patient leans: N/A   Psychiatric Specialty Exam: Physical Exam  Nursing note and vitals reviewed.   Review of Systems  Gastrointestinal: Positive for diarrhea.  All other systems reviewed and are negative.   Blood pressure 109/74, pulse 94, temperature 98.2 F  (36.8 C), temperature source Oral, resp. rate 20, height 4\' 11"  (1.499 m), weight 63.504 kg (140 lb), SpO2 98 %.Body mass index is 28.26 kg/(m^2).  General Appearance: Bizarre and Disheveled  Eye Contact::  Good  Speech:  Pressured  Volume:  Increased  Mood:  Angry, Dysphoric and Irritable  Affect:  Inappropriate and Labile  Thought Process:  Disorganized  Orientation:  Full (Time, Place, and Person)  Thought Content:  Delusions and Paranoid Ideation  Suicidal Thoughts:  No  Homicidal Thoughts:  No  Memory:  Immediate;   Fair Recent;   Fair Remote;   Fair  Judgement:  Fair  Insight:  Fair  Psychomotor Activity:  Increased  Concentration:  Fair  Recall:  [employer] of Knowledge:Fair  Language: Fair  Akathisia:  No  Handed:  Right  AIMS (if indicated):     Assets:  Psychologist, counselling Resources/Insurance  ADL's:  Intact  Cognition: WNL  Sleep:  Number of Hours: 6     Current Medications: Current Facility-Administered Medications  Medication Dose Route Frequency Provider Last Rate Last Dose  . acetaminophen (TYLENOL) tablet 650 mg  650 mg Oral Q6H PRN Shari Prows, MD   650 mg at 11/17/14 0911  . albuterol (PROVENTIL HFA;VENTOLIN HFA) 108 (90 BASE) MCG/ACT inhaler 2 puff  2 puff Inhalation Q4H Yelina Sarratt B Chanika Byland, MD   2 puff at 11/17/14 1549  . calcium carbonate (TUMS - dosed in mg elemental calcium) chewable tablet 200 mg of elemental calcium  1 tablet Oral TID WC Jimmy Footman, MD   200 mg of elemental calcium at 11/17/14 1548  . chlorproMAZINE (THORAZINE) tablet 50 mg  50 mg Oral QID PRN Shari Prows, MD   50 mg at 11/17/14 1553  . Darunavir Ethanolate (PREZISTA) tablet 800 mg  800 mg Oral Q breakfast Normajean Nash B Sagal Gayton, MD   800 mg at 11/17/14 0908  . emtricitabine-tenofovir (TRUVADA) 200-300 MG per tablet 1 tablet  1 tablet Oral Daily Shari Prows, MD   1 tablet at 11/17/14 0908  . LORazepam (ATIVAN) tablet 2 mg  2 mg  Oral Q4H PRN Jimmy Footman, MD   2 mg at 11/15/14 2246  . magnesium hydroxide (MILK OF MAGNESIA) suspension 30 mL  30 mL Oral Daily PRN Shari Prows, MD      . Melene Muller ON 11/21/2014] paliperidone (INVEGA SUSTENNA) injection 156 mg  156 mg Intramuscular Once Raynaldo Falco B Corayma Cashatt, MD      . paliperidone (INVEGA SUSTENNA) injection 234 mg  234 mg Intramuscular Once Shari Prows, MD      . Melene Muller ON 12/18/2014] paliperidone (INVEGA SUSTENNA) injection 234 mg  234 mg Intramuscular Once Amiley Shishido B Zeplin Aleshire, MD      . pantoprazole (PROTONIX) EC tablet 40 mg  40 mg Oral Daily Makenna Macaluso B  Nazli Penn, MD   40 mg at 11/17/14 0908  . raltegravir (ISENTRESS) tablet 400 mg  400 mg Oral BID Shari Prows, MD   400 mg at 11/17/14 0908  . risperiDONE (RISPERDAL M-TABS) disintegrating tablet 3 mg  3 mg Oral BID Shari Prows, MD   3 mg at 11/17/14 0908  . ritonavir (NORVIR) tablet 100 mg  100 mg Oral Q breakfast Silvia Hightower B Terald Jump, MD   100 mg at 11/17/14 0908  . temazepam (RESTORIL) capsule 15 mg  15 mg Oral QHS Shari Prows, MD   15 mg at 11/16/14 2139    Lab Results: No results found for this or any previous visit (from the past 48 hour(s)).  Physical Findings: AIMS: Facial and Oral Movements Muscles of Facial Expression: None, normal Lips and Perioral Area: None, normal Jaw: None, normal Tongue: None, normal,Extremity Movements Upper (arms, wrists, hands, fingers): None, normal Lower (legs, knees, ankles, toes): None, normal, Trunk Movements Neck, shoulders, hips: None, normal, Overall Severity Severity of abnormal movements (highest score from questions above): None, normal Incapacitation due to abnormal movements: None, normal Patient's awareness of abnormal movements (rate only patient's report): No Awareness, Dental Status Current problems with teeth and/or dentures?: No Does patient usually wear dentures?: No  CIWA:    COWS:     Treatment Plan  Summary: Daily contact with patient to assess and evaluate symptoms and progress in treatment and Medication management   Medical Decision Making:  Established Problem, Stable/Improving (1), Review of Psycho-Social Stressors (1), Review or order clinical lab tests (1), Review of Medication Regimen & Side Effects (2) and Review of New Medication or Change in Dosage (2)   Miss Hendricksen is a 54 year old female with history of schizoaffective disorder admitting for psychotic break.  0. Agitation. Resolved.    1. Psychosis. The patient was recently discharged from [facility] on a combination of Risperdal Consta 50 mg every 2 weeks next injection 8/11 and oral Risperdal 1 mg at bedtime. We will increase Risperdal to 3 mg twice daily by mouth. We will start [name].  2. Mood. In addition to psychosis at the patient on admission appeared manic restless and rule out agitated intrusive disorganized. She was continued on Neurontin. Westarted Depakote but had to discontinue due to high ammonia that has normalized by now.  3, HIV. We will continue all antiretrovirals.   4. COPD. She is on inhalers.  5. Anxiety. She is on Vistaril.  6. Constipation. MiraLAX.  7. GERD. We will offer pantoprazole.  8. Insomnia. Restoril is lowered to 15 mg nightly.   9. UA is not suggestive of UTI.   10. Disposition. She will be discharged to home. She will [facility] with [facility].       Amry Cathy 11/17/2014, 4:55 PM

## 2014-11-17 NOTE — Progress Notes (Signed)
Recreation Therapy Notes  Date: 08.09.16 Time: 3:00 pm Location: Craft Room  Group Topic: Goal Setting  Goal Area(s) Addresses:  Patients will write at least one goal. Patients will write at least one obstacle.  Behavioral Response: Did not attend  Intervention: Recovery Goal Chart  Activity: Patients were instructed to make a Recovery Goal Chart listing goals, obstacles, the date they started working on their goal and the date they completed their goal.  Education: LRT educated patients on healthy ways to celebrate reaching their goals.   Education Outcome: Patient did not attend group.  Clinical Observations/Feedback: Patient did not attend group.  Jennea Rager M, LRT/CTRS 11/17/2014 4:09 PM 

## 2014-11-17 NOTE — Progress Notes (Signed)
Patient c/o and nurse noted small amount of blood when wiping after stool. MD is aware. Patient on phone and has angry outburst. Mad at brother, yelling on phone and states her brother got her monthly check and he will spend it. Patient throws walker to ground.  Patient escorted back to room and redirected. She is demanding at times. Limits set and safety maintained.

## 2014-11-17 NOTE — Progress Notes (Signed)
Pt remains with sitter for safety and much needed redirection. Pt using walker moves fast through the halls . Needs to be reminded to slow down. Pt labile.Med compliant.

## 2014-11-17 NOTE — Plan of Care (Signed)
Problem: Alteration in thought process Goal: LTG-Patient behavior demonstrates decreased signs psychosis (Patient behavior demonstrates decreased signs of psychosis to the point the patient is safe to return home and continue treatment in an outpatient setting.)  Outcome: Progressing Pt appears delusional at times.   Problem: Ineffective individual coping Goal: STG: Patient will remain free from self harm Outcome: Progressing Pt safe on the unit

## 2014-11-17 NOTE — Progress Notes (Signed)
Patient with increasing agitation this afternoon into evening. Hyperactive, hyperverbal (nonsensical) and increasingly agitated. Appetite good and po fluids tolerated well. Remains on one to one with staff, ambulates with walker and steady gait. Thorazine administered with fair result. Begins to yell in dayroom during dinner time and Ativan 2 mg po prn administered with good results. Hinda Glatter Sustena administered, tolerated well. Safety maintained.

## 2014-11-17 NOTE — [facility] Group Notes (Signed)
[facility] Group Notes:  (Nursing/MHT/Case Management/Adjunct)  Date:  11/17/2014  Time:  2:10 PM  Type of Therapy:  Psychoeducational Skills  Participation Level:  Did Not Attend      Mickey Farber 11/17/2014, 2:10 PM

## 2014-11-17 NOTE — Progress Notes (Signed)
Patient with sad affect, disorganized behavior. Patient remains one on one with staff. No SI/HI/AVH verbalized. Patient does speak about her family members during med administration stating that "they do need lunch menus". Oriented to name and does well with support and simple directions. Patient with poor cognition and needs monitoring related toileting  and hand washing. Appetite good. Takes meds as ordered, uses inhaler with good technique. No SOB. Safety maintained.

## 2014-11-18 MED ORDER — DIVALPROEX SODIUM 125 MG PO CSDR
500.0000 mg | DELAYED_RELEASE_CAPSULE | Freq: Every day | ORAL | Status: DC
  Administered 2014-11-18 – 2014-11-23 (×6): 500 mg via ORAL
  Filled 2014-11-18 (×8): qty 4

## 2014-11-18 NOTE — [facility] Group Notes (Signed)
[facility] LCSW Group Therapy  11/18/2014 2:50 PM  Type of Therapy:  Group Therapy  Participation Level:  Active  Participation Quality:  Intrusive and Monopolizing  Affect:  Flat  Cognitive:  Disorganized  Insight:  Limited  Engagement in Therapy:  Limited  Modes of Intervention:  Discussion, Education, Socialization and Support  Summary of Progress/Problems: Emotional Regulation: Patients will identify both negative and positive emotions. They will discuss emotions they have difficulty regulating and how they impact their lives. Patients will be asked to identify healthy coping skills to combat unhealthy reactions to negative emotions.   [name] had difficulties staying on topic. She would tell stories that were inappropriate and bizarre. [name] is redirectable but needs constant redirection.   Elane Peabody L Zael Shuman MSW, LCSWA  11/18/2014, 2:50 PM

## 2014-11-18 NOTE — [facility] Group Notes (Signed)
[facility] LCSW Aftercare Discharge Planning Group Note   11/18/2014 10:46 AM  Participation Quality:  Patient attend group but was disorganized and distracting as she was off topic and hyper verbal but somewhat redirectable. Patient shared her SMART goal with encouragement from facilitator and shared that her goal is to "go home for [employer].....get what I can from group".   Mood/Affect:  Excited  Depression Rating:    Anxiety Rating:    Thoughts of Suicide:  No Will you contract for safety?   Yes  Current AVH:  No   Lulu Riding, MSW, [employer]

## 2014-11-18 NOTE — Progress Notes (Signed)
INITIAL NUTRITION ASSESSMENT   INTERVENTION:  Meals and Snacks: encourage menu completion to best meet pt preferences Coordination of care: No further nutrition intervention warranted at this time. Please consult RD if needed.  NUTRITION DIAGNOSIS:  No nutrition diagnosis at this time   GOAL:  Patient will meet greater than or equal to 90% of their needs  MONITOR:  Energy Intake, Anthropometrics   REASON FOR ASSESSMENT:  Pt admitted with schizophrenia  ASSESSMENT:  [name] is a 54 y.o. female with Paranoid schizophrenia  Past Medical History  Diagnosis Date  . GERD (gastroesophageal reflux disease)   . Schizophrenia   . AIDS 12-10-2007  . Herpes simplex   . History of cholecystectomy   . Cataract   . Nonspecific reaction to tuberculin skin test without active tuberculosis 11-2008 [facility]   . Seizures   . Asthmatic bronchitis , chronic   . Hepatitis B infection     followed by ID  . Chronic hyponatremia     Diet Order: regular  Current Nutrition: eating 80-100% of meals    Medications: reviewed  Protein Profile:  Protein Profile:  Recent Labs Lab 11/11/14 1748  ALBUMIN 3.7     Glucose and Electrolyte/Renal profile:   Recent Labs Lab 11/11/14 1748  NA 135  K 4.7  CL 100*  CO2 25  BUN 15  CREATININE 1.02*  CALCIUM 9.2  GLUCOSE 79      Anthropometrics:   Body mass index is 28.26 kg/(m^2).  Filed Weights   11/09/14 1817  Weight: 140 lb (63.504 kg)       [name] B. Freida Busman, RD, LDN [phone] (pager)

## 2014-11-18 NOTE — Progress Notes (Signed)
Recreation Therapy Notes  Date: 08.10.16 Time: 3:00 pm Location: Craft Room  Group Topic: Self-esteem  Goal Area(s) Addresses:  Patient will write down at least one positive trait about self.  Behavioral Response: Attentive, Disruptive, Left early  Intervention: I Am  Activity: Patients were given a worksheet with the letter I on it and instructed to list as many positive traits as they could inside the I.   Education: LRT educated patients on ways they can increase their self-esteem.  Education Outcome: Patient left before LRT educated patients.  Clinical Observations/Feedback: Patient completed activity, but filled her letter with things like "I love my brothers", "I am going to buy a new flat screen TV", etc. LRT had to redirect patient multiple times. Patient difficult to redirect. Patient left group at approximately 3:33 pm. Patient did not return to group.  Jacquelynn Cree, LRT/CTRS 11/18/2014 4:24 PM

## 2014-11-18 NOTE — [facility] Group Notes (Signed)
[facility] Group Notes:  (Nursing/MHT/Case Management/Adjunct)  Date:  11/18/2014  Time:  1:39 PM  Type of Therapy:  Psychoeducational Skills  Participation Level:  Minimal  Participation Quality:  Inattentive, Redirectable and Left early  Affect:  Excited  Cognitive:  Disorganized and Lacking  Insight:  Limited  Engagement in Group:  Off Topic and Poor  Modes of Intervention:  Discussion, Education and Support  Summary of Progress/Problems:  Lynelle Smoke Stephania Macfarlane 11/18/2014, 1:39 PM

## 2014-11-18 NOTE — Progress Notes (Signed)
[facility] MD Progress Note  11/18/2014 2:21 PM [name]  MRN:  [id number]  Subjective:  [name] seems a little better today but still intrusive, loud, hard to redirect, disorganized, and psychotic. We have not been able to contact her brother. We do not know her baseline. 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 She has no somatic complaints. She reports good sleep and appetite. She was agitated yesterday afternoon and was given extra doses of Thorazine.  Principal Problem: Paranoid schizophrenia Diagnosis:   Patient Active Problem List   Diagnosis Date Noted  . Schizophrenia, paranoid type [F20.0] 11/09/2014  . 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 Asthma, chronic [J45.909]   . HIV disease [B20]   . 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 Paranoid schizophrenia [F20.0]   . Psychosis [F29] 10/30/2014  . Psychoses [F29]   . Hyponatremia [E87.1] 10/22/2014  . Microscopic hematuria [R31.2] 01/29/2014  . Abdominal pain [R10.9] 12/12/2013  . Vulvar irritation [N90.89] 12/12/2013  . COPD (chronic obstructive pulmonary disease) [J44.9] 02/18/2013  . Tobacco use disorder [Z72.0] 02/18/2013  . Hepatitis B infection [B16.9]   . Pre-ulcerative corn or callous [L84] 05/07/2012  . Urinary incontinence, nocturnal enuresis [N39.44] 05/07/2012  . Fecal incontinence [R15.9] 05/07/2012  . Systolic murmur [R01.1] 05/07/2012  . Routine health maintenance [Z00.00] 05/07/2012  . Asthma [493] 08/18/2010  . HIV (human immunodeficiency virus infection) [Z21] 08/18/2010  . Schizophrenia [F20.9] 08/18/2010  . Herpes simplex infection [B00.9] 08/18/2010   Total Time spent with patient: 20 minutes   Past Medical History:  Past Medical History  Diagnosis Date  . GERD (gastroesophageal reflux disease)   . Schizophrenia   . AIDS 12-10-2007  . Herpes simplex   . History of cholecystectomy   . Cataract   . Nonspecific reaction to tuberculin skin  test without active tuberculosis 11-2008 [facility]   . Seizures   . Asthmatic bronchitis , chronic   . Hepatitis B infection     followed by ID  . Chronic hyponatremia     Past Surgical History  Procedure Laterality Date  . Cholecystectomy    . Leg surgery      right leg, s/p accident   Family History:  Family History  Problem Relation Age of Onset  . Diabetes Sister   . Cancer Sister     breast  . Diabetes Brother   . Hypertension Brother   . Cancer Mother     lung  . Hypertension Mother   . Heart disease Father    Social History:  History  Alcohol Use No    Comment: Patient denies      History  Drug Use No    Comment: Patient denies    Social History   Social History  . Marital Status: Single    Spouse Name: N/A  . Number of Children: N/A  . Years of Education: N/A   Social History Main Topics  . Smoking status: Current Every Day Smoker -- 2.00 packs/day for 35 years    Types: Cigarettes    Start date: 04/10/1974  . Smokeless tobacco: Never Used     Comment: Previously smoked 2ppd, might start patch  . Alcohol Use: No     Comment: Patient denies   . Drug Use: No     Comment: Patient denies  . Sexual Activity: Not Currently     Comment: pt. declined condoms   Other Topics Concern  .  None   Social History Narrative   Additional History:    Sleep: Good  Appetite:  Good   Assessment:   Musculoskeletal: Strength & Muscle Tone: within normal limits Gait & Station: normal Patient leans: N/A   Psychiatric Specialty Exam: Physical Exam  Nursing note and vitals reviewed.   Review of Systems  All other systems reviewed and are negative.   Blood pressure 109/74, pulse 94, temperature 98.2 F (36.8 C), temperature source Oral, resp. rate 20, height 4\' 11"  (1.499 m), weight 63.504 kg (140 lb), SpO2 98 %.Body mass index is 28.26 kg/(m^2).  General Appearance: Disheveled  Eye Contact::  Good  Speech:  Pressured  Volume:  Increased  Mood:  Euphoric   Affect:  Labile  Thought Process:  Disorganized  Orientation:  Full (Time, Place, and Person)  Thought Content:  Delusions and Paranoid Ideation  Suicidal Thoughts:  No  Homicidal Thoughts:  No  Memory:  Immediate;   Poor Recent;   Poor Remote;   Poor  Judgement:  Poor  Insight:  Lacking  Psychomotor Activity:  Increased  Concentration:  Poor  Recall:  Poor  Fund of Knowledge:Fair  Language: Fair  Akathisia:  No  Handed:  Right  AIMS (if indicated):     Assets:  Communication Skills Desire for Improvement Financial Resources/Insurance Housing Social Support  ADL's:  Intact  Cognition: WNL  Sleep:  Number of Hours: 7     Current Medications: Current Facility-Administered Medications  Medication Dose Route Frequency Provider Last Rate Last Dose  . acetaminophen (TYLENOL) tablet 650 mg  650 mg Oral Q6H PRN Shari Prows, MD   650 mg at 11/18/14 0924  . albuterol (PROVENTIL HFA;VENTOLIN HFA) 108 (90 BASE) MCG/ACT inhaler 2 puff  2 puff Inhalation Q4H Juel Ripley B Texie Tupou, MD   2 puff at 11/18/14 1149  . calcium carbonate (TUMS - dosed in mg elemental calcium) chewable tablet 200 mg of elemental calcium  1 tablet Oral TID WC Jimmy Footman, MD   200 mg of elemental calcium at 11/18/14 1149  . chlorproMAZINE (THORAZINE) tablet 50 mg  50 mg Oral QID PRN Shari Prows, MD   50 mg at 11/17/14 1553  . Darunavir Ethanolate (PREZISTA) tablet 800 mg  800 mg Oral Q breakfast Atalie Oros B Joachim Carton, MD   800 mg at 11/18/14 0922  . emtricitabine-tenofovir (TRUVADA) 200-300 MG per tablet 1 tablet  1 tablet Oral Daily Shari Prows, MD   1 tablet at 11/18/14 0922  . LORazepam (ATIVAN) tablet 2 mg  2 mg Oral Q4H PRN Jimmy Footman, MD   2 mg at 11/18/14 1354  . magnesium hydroxide (MILK OF MAGNESIA) suspension 30 mL  30 mL Oral Daily PRN Shari Prows, MD      . Melene Muller ON 11/21/2014] paliperidone (INVEGA SUSTENNA) injection 156 mg  156 mg  Intramuscular Once Kyree Fedorko B Harry Shuck, MD      . Melene Muller ON 12/18/2014] paliperidone (INVEGA SUSTENNA) injection 234 mg  234 mg Intramuscular Once Camera Krienke B Jaziah Kwasnik, MD      . pantoprazole (PROTONIX) EC tablet 40 mg  40 mg Oral Daily Braison Snoke B Jd Mccaster, MD   40 mg at 11/18/14 0922  . raltegravir (ISENTRESS) tablet 400 mg  400 mg Oral BID Shari Prows, MD   400 mg at 11/18/14 0922  . risperiDONE (RISPERDAL M-TABS) disintegrating tablet 3 mg  3 mg Oral BID Shari Prows, MD   3 mg at 11/18/14 0922  . ritonavir (NORVIR) tablet  100 mg  100 mg Oral Q breakfast Shari Prows, MD   100 mg at 11/18/14 0922  . temazepam (RESTORIL) capsule 15 mg  15 mg Oral QHS [place] Kirchner B Nicholson Starace, MD   15 mg at 11/17/14 2103    Lab Results: No results found for this or any previous visit (from the past 48 hour(s)).  Physical Findings: AIMS: Facial and Oral Movements Muscles of Facial Expression: None, normal Lips and Perioral Area: None, normal Jaw: None, normal Tongue: None, normal,Extremity Movements Upper (arms, wrists, hands, fingers): None, normal Lower (legs, knees, ankles, toes): None, normal, Trunk Movements Neck, shoulders, hips: None, normal, Overall Severity Severity of abnormal movements (highest score from questions above): None, normal Incapacitation due to abnormal movements: None, normal Patient's awareness of abnormal movements (rate only patient's report): No Awareness, Dental Status Current problems with teeth and/or dentures?: No Does patient usually wear dentures?: No  CIWA:    COWS:     Treatment Plan Summary: Daily contact with patient to assess and evaluate symptoms and progress in treatment and Medication management   Medical Decision Making:  Established Problem, Stable/Improving (1), Review of Psycho-Social Stressors (1), Review or order clinical lab tests (1), Review of Medication Regimen & Side Effects (2) and Review of New Medication or Change in Dosage  (2)   Miss Larsen is a 54 year old female with history of schizoaffective disorder admitting for psychotic break.  0. Agitation. Resolved.    1. Psychosis. The patient was recently discharged from [facility] on a combination of Risperdal Consta 50 mg every 2 weeks next injection 8/11 and oral Risperdal 1 mg at bedtime. We will increase Risperdal to 3 mg twice daily by mouth. We will start [name].  2. Mood. In addition to psychosis at the patient on admission appeared manic restless and rule out agitated intrusive disorganized. She was continued on Neurontin. Westarted Depakote but had to discontinue due to high ammonia that has normalized by now.  3, HIV. We will continue all antiretrovirals.   4. COPD. She is on inhalers.  5. Anxiety. She is on Vistaril.  6. Constipation. MiraLAX.  7. GERD. We will offer pantoprazole.  8. Insomnia. Restoril is lowered to 15 mg nightly.   9. UA is not suggestive of UTI.   10. Disposition. She will be discharged to home. She will [facility] with [facility].      Derelle Cockrell 11/18/2014, 2:21 PM

## 2014-11-18 NOTE — Progress Notes (Signed)
Patient alert and oriented x 1. Shower and dress with set up and assist by staff. Appetite good and takes fluids well. Remains disorganized in thoughts and activities. Ambulates with unsteady gait and remains on fall protocol with 1:1 staff at side. Takes meds and safety maintained.

## 2014-11-18 NOTE — Plan of Care (Signed)
Problem: Alteration in thought process Goal: LTG-Patient behavior demonstrates decreased signs psychosis (Patient behavior demonstrates decreased signs of psychosis to the point the patient is safe to return home and continue treatment in an outpatient setting.)  Outcome: Not Progressing Patient remains delusional and disorganized in thoughts and behaviors.

## 2014-11-19 ENCOUNTER — Other Ambulatory Visit: Payer: Self-pay | Admitting: *Deleted

## 2014-11-19 DIAGNOSIS — B20 Human immunodeficiency virus [HIV] disease: Secondary | ICD-10-CM

## 2014-11-19 MED ORDER — RALTEGRAVIR POTASSIUM 400 MG PO TABS: 400.0000 mg | ORAL_TABLET | Freq: Two times a day (BID) | ORAL | Status: DC

## 2014-11-19 MED ORDER — DARUNAVIR ETHANOLATE 800 MG PO TABS: 800.0000 mg | ORAL_TABLET | Freq: Every day | ORAL | Status: DC

## 2014-11-19 MED ORDER — RITONAVIR 100 MG PO TABS: 100.0000 mg | ORAL_TABLET | Freq: Every day | ORAL | Status: DC

## 2014-11-19 MED ORDER — WHITE PETROLATUM GEL
Status: DC | PRN
  Filled 2014-11-19 (×2): qty 28.35

## 2014-11-19 MED ORDER — EMTRICITABINE-TENOFOVIR DF 200-300 MG PO TABS: 1.0000 | ORAL_TABLET | Freq: Every day | ORAL | Status: DC

## 2014-11-19 NOTE — Plan of Care (Signed)
Problem: Ineffective individual coping Goal: STG: Pt will be able to identify effective and ineffective STG: Pt will be able to identify effective and ineffective coping patterns  Outcome: Not Progressing Pt with non sensical communication

## 2014-11-19 NOTE — [facility] Group Notes (Signed)
[facility] Group Notes:  (Nursing/MHT/Case Management/Adjunct)  Date:  11/19/2014  Time:  2:21 PM  Type of Therapy:  Psychoeducational Skills  Participation Level:  Minimal  Participation Quality:  Inattentive and Monopolizing  Affect:  Flat  Cognitive:  Disorganized  Insight:  Lacking  Engagement in Group:  Off Topic  Modes of Intervention:  Activity, Discussion and Support  Summary of Progress/Problems:  [name] 11/19/2014, 2:21 PM

## 2014-11-19 NOTE — Progress Notes (Signed)
Ms Farino attended group (went outside), restless, almost everywhere in the milieu, ambulates with a rolling walker, speech "run off" and tangential loose association, disconnected and disorganized thoughts process; denies SI/HI, denied AV/H; "I don't have suicidal ideation but I have ideation of stealing gold .Marland Kitchen.." Laughs inappropriately most of the time

## 2014-11-19 NOTE — Plan of Care (Signed)
Problem: Alteration in thought process Goal: STG-Patient is able to discuss thoughts with staff Outcome: Progressing Patient, due to: low concentration, thoughts disorganized and disconnected, incoherent, unable to identify areas of needs. Room is free of environmental hazards  Problem: Ineffective individual coping Goal: STG: Patient will remain free from self harm Outcome: Progressing Medication compliant, PRN Ativan 2 mg given for agitation and anxiety, remains on 1:1 for safety

## 2014-11-19 NOTE — Progress Notes (Signed)
Recreation Therapy Notes  Date: 08.11.16 Time: 3:00 pm Location: Craft Room  Group Topic: Leisure Education  Goal Area(s) Addresses:  Patient will participate in leisure activity. Patient will verbalize one emotion experienced during leisure activity.  Behavioral Response: Disruptive, Inappropriate, Left early  Intervention: Coloring  Activity: Patients were given coloring sheets and instructed to color.  Education: LRT educated patients on the importance of participating in leisure.  Education Outcome: Patient left before LRT educated group.  Clinical Observations/Feedback: Patient participated in group activity. Patient was looking through coloring sheets. LRt grabbed some to give to another patient to look through. Patient yelled, "Those are mine!" LRT explained that patients get to pick one coloring sheet and other patients need to look through the sheets. Patient continued talking stating, "[place], so you don't want to mess with me." LRT attempted to redirect patient multiple times. Patient difficult to redirect. Patient left group at approximately 3:25 pm. Patient did not return to group.  Jacquelynn Cree, LRT/CTRS 11/19/2014 4:30 PM

## 2014-11-19 NOTE — Tx Team (Signed)
Interdisciplinary Treatment Plan Update (Adult)  Date:  11/19/2014 Time Reviewed:  2:15 PM  Progress in Treatment: Attending groups: Yes. Participating in groups:  Yes. Taking medication as prescribed:  Yes. Tolerating medication:  Yes. Family/Significant othe contact made:  No, will contact:  Brother Patient understands diagnosis:  No. and As evidenced by:  Limited insight  Discussing patient identified problems/goals with staff:  Yes. Medical problems stabilized or resolved:  Yes. Denies suicidal/homicidal ideation: Yes. Issues/concerns per patient self-inventory:  No. Other:  New problem(s) identified: No, Describe:  None   Discharge Plan or Barriers: Pt plans to return home and follow up with outpatient.   Reason for Continuation of Hospitalization: Mania Medication stabilization  Comments: [name] appears slightly better today. She is nicely groomed and showered, wearing clean clothes, moving about the unit with a walker. She slept only 4 hours yesterday which is unusual. She is easily directable according to her sitter. She denies any somatic symptoms. She denies symptoms of depression or psychosis. She is still somewhat disorganized, tangential, but able to return to the topic of conversation. It is unclear if the information she provides is accurate. Social worker spoke with her ACT team yesterday. Apparently the patient has been doing well for extended periods of time but deteriorated several months ago. They have been taking her to the emergency room almost weekly. She was admitted briefly once or twice with no improvement.  Estimated length of stay: 7 days   New goal(s): NA  Review of initial/current patient goals per problem list:   1.  Goal(s): Patient will participate in aftercare plan * Met:  * Target date: at discharge * As evidenced by: Patient will participate within aftercare plan AEB aftercare provider and housing plan at discharge being identified.  2.   Goal (s): Patient will demonstrate decreased symptoms of mania. * Met: No  *  Target date: at discharge * As evidenced by: Patient will not endorse signs of psychosis or be deemed stable for discharge by MD.   Attendees: Patient:  [name] 8/11/20162:15 PM  Family:   8/11/20162:15 PM  Physician:  Dr. Williams Che  8/11/20162:15 PM  Nursing:   Floyde Parkins, RN  8/11/20162:15 PM  Clinical Social Worker: [place], [place]  8/11/20162:15 PM  Counselor:   8/11/20162:15 PM  Other:  Everitt Amber, LRT 8/11/20162:15 PM  Other:   8/11/20162:15 PM  Other:   8/11/20162:15 PM  Other:  8/11/20162:15 PM  Other:  8/11/20162:15 PM  Other:  8/11/20162:15 PM  Other:  8/11/20162:15 PM  Other:  8/11/20162:15 PM  Other:  8/11/20162:15 PM  Other:   8/11/20162:15 PM   Scribe for Treatment Team:   Wray Kearns, MSW, [place]  11/19/2014, 2:15 PM

## 2014-11-19 NOTE — Progress Notes (Signed)
[facility] MD Progress Note  11/19/2014 11:49 AM [name]  MRN:  [id number]  Subjective:  Ms. Kagel appears slightly better today. She is nicely groomed and showered, wearing clean clothes, moving about the unit with a walker. She slept only 4 hours yesterday which is unusual. She is easily directable according to her sitter. She denies any somatic symptoms. She denies symptoms of depression or psychosis. She is still somewhat disorganized, tangential, but able to return to the topic of conversation. It is unclear if the information she provides is accurate. Social worker spoke with her ACT team yesterday. Apparently the patient has been doing well for extended periods of time but deteriorated several months ago. They have been taking her to the emergency room almost weekly. She was admitted briefly once or twice with no improvement.  Principal Problem: Paranoid schizophrenia Diagnosis:   Patient Active Problem List   Diagnosis Date Noted  . Schizophrenia, paranoid type [F20.0] 11/09/2014  . 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 Asthma, chronic [J45.909]   . HIV disease [B20]   . 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 Paranoid schizophrenia [F20.0]   . Psychosis [F29] 10/30/2014  . Psychoses [F29]   . Hyponatremia [E87.1] 10/22/2014  . Microscopic hematuria [R31.2] 01/29/2014  . Abdominal pain [R10.9] 12/12/2013  . Vulvar irritation [N90.89] 12/12/2013  . COPD (chronic obstructive pulmonary disease) [J44.9] 02/18/2013  . Tobacco use disorder [Z72.0] 02/18/2013  . Hepatitis B infection [B16.9]   . Pre-ulcerative corn or callous [L84] 05/07/2012  . Urinary incontinence, nocturnal enuresis [N39.44] 05/07/2012  . Fecal incontinence [R15.9] 05/07/2012  . Systolic murmur [R01.1] 05/07/2012  . Routine health maintenance [Z00.00] 05/07/2012  . Asthma [493] 08/18/2010  . HIV (human immunodeficiency virus infection) [Z21] 08/18/2010  . Schizophrenia [F20.9] 08/18/2010  .  Herpes simplex infection [B00.9] 08/18/2010   Total Time spent with patient: 20 minutes   Past Medical History:  Past Medical History  Diagnosis Date  . GERD (gastroesophageal reflux disease)   . Schizophrenia   . AIDS 12-10-2007  . Herpes simplex   . History of cholecystectomy   . Cataract   . Nonspecific reaction to tuberculin skin test without active tuberculosis 11-2008 [facility]   . Seizures   . Asthmatic bronchitis , chronic   . Hepatitis B infection     followed by ID  . Chronic hyponatremia     Past Surgical History  Procedure Laterality Date  . Cholecystectomy    . Leg surgery      right leg, s/p accident   Family History:  Family History  Problem Relation Age of Onset  . Diabetes Sister   . Cancer Sister     breast  . Diabetes Brother   . Hypertension Brother   . Cancer Mother     lung  . Hypertension Mother   . Heart disease Father    Social History:  History  Alcohol Use No    Comment: Patient denies      History  Drug Use No    Comment: Patient denies    Social History   Social History  . Marital Status: Single    Spouse Name: N/A  . Number of Children: N/A  . Years of Education: N/A   Social History Main Topics  . Smoking status: Current Every Day Smoker -- 2.00 packs/day for 35 years    Types: Cigarettes    Start date: 04/10/1974  . Smokeless tobacco: Never Used  Comment: Previously smoked 2ppd, might start patch  . Alcohol Use: No     Comment: Patient denies   . Drug Use: No     Comment: Patient denies  . Sexual Activity: Not Currently     Comment: pt. declined condoms   Other Topics Concern  . None   Social History Narrative   Additional History:    Sleep: Fair  Appetite:  Good   Assessment:   Musculoskeletal: Strength & Muscle Tone: within normal limits Gait & Station: normal Patient leans: N/A   Psychiatric Specialty Exam: Physical Exam  Nursing note and vitals reviewed.   Review of Systems  All other systems  reviewed and are negative.   Blood pressure 103/71, pulse 102, temperature 97.7 F (36.5 C), temperature source Oral, resp. rate 20, height 4\' 11"  (1.499 m), weight 63.504 kg (140 lb), SpO2 98 %.Body mass index is 28.26 kg/(m^2).  General Appearance: Casual  Eye Contact::  Good  Speech:  Normal Rate  Volume:  Normal  Mood:  Euphoric  Affect:  Appropriate  Thought Process:  Disorganized  Orientation:  Full (Time, Place, and Person)  Thought Content:  Delusions and Paranoid Ideation  Suicidal Thoughts:  No  Homicidal Thoughts:  No  Memory:  Immediate;   Poor Recent;   Poor Remote;   Poor  Judgement:  Poor  Insight:  Lacking  Psychomotor Activity:  Normal  Concentration:  Poor  Recall:  Poor  Fund of Knowledge:Poor  Language: Poor  Akathisia:  No  Handed:  Right  AIMS (if indicated):     Assets:  Communication Skills Desire for Improvement Financial Resources/Insurance Housing Social Support  ADL's:  Intact  Cognition: WNL  Sleep:  Number of Hours: 5.25     Current Medications: Current Facility-Administered Medications  Medication Dose Route Frequency Provider Last Rate Last Dose  . acetaminophen (TYLENOL) tablet 650 mg  650 mg Oral Q6H PRN Shari Prows, MD   650 mg at 11/18/14 0924  . albuterol (PROVENTIL HFA;VENTOLIN HFA) 108 (90 BASE) MCG/ACT inhaler 2 puff  2 puff Inhalation Q4H Shari Prows, MD   2 puff at 11/19/14 0803  . calcium carbonate (TUMS - dosed in mg elemental calcium) chewable tablet 200 mg of elemental calcium  1 tablet Oral TID WC Jimmy Footman, MD   200 mg of elemental calcium at 11/19/14 0803  . Darunavir Ethanolate (PREZISTA) tablet 800 mg  800 mg Oral Q breakfast Cambren Helm B Jessy Calixte, MD   800 mg at 11/19/14 0802  . divalproex (DEPAKOTE SPRINKLE) capsule 500 mg  500 mg Oral QHS Shari Prows, MD   500 mg at 11/18/14 2212  . emtricitabine-tenofovir (TRUVADA) 200-300 MG per tablet 1 tablet  1 tablet Oral Daily Shari Prows, MD   1 tablet at 11/19/14 0802  . LORazepam (ATIVAN) tablet 2 mg  2 mg Oral Q4H PRN Jimmy Footman, MD   2 mg at 11/19/14 0028  . magnesium hydroxide (MILK OF MAGNESIA) suspension 30 mL  30 mL Oral Daily PRN Shari Prows, MD      . Melene Muller ON 11/21/2014] paliperidone (INVEGA SUSTENNA) injection 156 mg  156 mg Intramuscular Once Federick Levene B Nitika Jackowski, MD      . Melene Muller ON 12/18/2014] paliperidone (INVEGA SUSTENNA) injection 234 mg  234 mg Intramuscular Once Kinsly Hild B Adabella Stanis, MD      . pantoprazole (PROTONIX) EC tablet 40 mg  40 mg Oral Daily Joriel Streety B Princetta Uplinger, MD   40 mg at 11/19/14  0802  . raltegravir (ISENTRESS) tablet 400 mg  400 mg Oral BID Shari Prows, MD   400 mg at 11/19/14 0802  . risperiDONE (RISPERDAL M-TABS) disintegrating tablet 3 mg  3 mg Oral BID Shari Prows, MD   3 mg at 11/19/14 0802  . ritonavir (NORVIR) tablet 100 mg  100 mg Oral Q breakfast Natasha Burda B Mistina Coatney, MD   100 mg at 11/19/14 0802  . temazepam (RESTORIL) capsule 15 mg  15 mg Oral QHS Jatniel Verastegui B Ladislaus Repsher, MD   15 mg at 11/18/14 2124    Lab Results: No results found for this or any previous visit (from the past 48 hour(s)).  Physical Findings: AIMS: Facial and Oral Movements Muscles of Facial Expression: None, normal Lips and Perioral Area: None, normal Jaw: None, normal Tongue: None, normal,Extremity Movements Upper (arms, wrists, hands, fingers): None, normal Lower (legs, knees, ankles, toes): None, normal, Trunk Movements Neck, shoulders, hips: None, normal, Overall Severity Severity of abnormal movements (highest score from questions above): None, normal Incapacitation due to abnormal movements: None, normal Patient's awareness of abnormal movements (rate only patient's report): No Awareness, Dental Status Current problems with teeth and/or dentures?: No Does patient usually wear dentures?: No  CIWA:    COWS:     Treatment Plan Summary: Daily contact  with patient to assess and evaluate symptoms and progress in treatment and Medication management   Medical Decision Making:  Established Problem, Stable/Improving (1), Review of Psycho-Social Stressors (1), Review or order clinical lab tests (1), Review of Medication Regimen & Side Effects (2) and Review of New Medication or Change in Dosage (2)   Miss Gockley is a 54 year old female with history of schizoaffective disorder admitting for psychotic break.  0. Agitation. Resolved.    1. Psychosis. The patient was recently discharged from [facility] on a combination of Risperdal Consta 50 mg every 2 weeks next injection 8/11 and oral Risperdal 1 mg at bedtime. We will increase Risperdal to 3 mg twice daily by mouth. We start Invega Sustenna 234 mg. Next dose of 156 on Saturday.   2. Mood. In addition to psychosis at the patient on admission appeared manic restless and rule out agitated intrusive disorganized. She was continued on Neurontin. We started Depakote but had to discontinue due to high ammonia when given 1500 mg/day. I restarted depakote 500 mg nightly last night, 11/18/2014. Will monitor.   3, HIV. We will continue all antiretrovirals.   4. COPD. She is on inhalers.  5. Anxiety. She is on Vistaril.  6. Constipation. MiraLAX.  7. GERD. We will offer pantoprazole.  8. Insomnia. Restoril is lowered to 15 mg nightly.   9. UA is not suggestive of UTI.   10. Disposition. She will be discharged to home. She will follow up with [facility] ACT team.       Hersey Maclellan 11/19/2014, 11:49 AM

## 2014-11-19 NOTE — [facility] Group Notes (Signed)
[facility] Group Notes:  (Nursing/MHT/Case Management/Adjunct)  Date:  11/19/2014  Time:  8:55 AM  Type of Therapy:  Community Meeting   Participation Level:  Did Not Attend  Summary of Progress/Problems:  [name] [name] 11/19/2014, 8:55 AM

## 2014-11-19 NOTE — Progress Notes (Signed)
Pt not as loud, but continues to be disorganized. 1: with sitter. Denies SI, HI, AVH. C/o anxiety, prn given with good relief. Encouragement and support offered. Will continue to monitor and assess for safety.

## 2014-11-19 NOTE — [facility] Group Notes (Signed)
[facility] LCSW Group Therapy  11/19/2014 12:50 PM  Type of Therapy:  Group Therapy  Participation Level:  Active  Participation Quality:  Intrusive and Monopolizing  Affect:  Flat  Cognitive:  Disorganized  Insight:  Limited  Engagement in Therapy:  Limited  Modes of Intervention:  Discussion, Education, Socialization and Support  Summary of Progress/Problems: Balance in life: Patients will discuss the concept of balance and how it looks and feels to be unbalanced. Pt will identify areas in their life that is unbalanced and ways to become more balanced. [name] had difficulties staying on topic. She was able to identify family relationships as a stressor for her but was unable elaborate coherently. She would say something that was on topic but would follow it with something that did not relate to the previous statement.   [name] MSW, LCSWA  11/19/2014, 12:50 PM

## 2014-11-20 NOTE — Progress Notes (Signed)
[facility] MD Progress Note  11/20/2014 3:33 PM [name]  MRN:  [id number]  Subjective:  [name] is a 54 year old female with a history of schizophrenia or schizoaffective disorder admitting for worsening of psychosis in a hypomanic episode. Per her ACT team, the patient has been agitated anf off baseline for several weeks but was stable, without showing suigns of dementia, before.   [name] remains confused, intrusive, easily agitated, irritable, and argumentative. She seems delusional and grandiose. She is disruptive in the and has sitter. Her grooming is poor and she walks around with underwear on her head.  The patient herself has no complaints. She denies any symptoms of depression, anxiety, or psychosis. She reports good sleep and appetite. There are no somatic complaints. She tries to participate in groups. She can be confused at times and has sitter.   Principal Pro is a 53: blem: Paranoid schizophrenia So he states that Diagnosis:   Patient Active Problem List   Diagnosis Date Noted  . Schizophrenia, paranoid type [F20.0] 11/09/2014  . 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 Asthma, chronic [J45.909]   . HIV disease [B20]   . 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 Paranoid schizophrenia [F20.0]   . Psychosis [F29] 10/30/2014  . Psychoses [F29]   . Hyponatremia [E87.1] 10/22/2014  . Microscopic hematuria [R31.2] 01/29/2014  . Abdominal pain [R10.9] 12/12/2013  . Vulvar irritation [N90.89] 12/12/2013  . COPD (chronic obstructive pulmonary disease) [J44.9] 02/18/2013  . Tobacco use disorder [Z72.0] 02/18/2013  . Hepatitis B infection [B16.9]   . Pre-ulcerative corn or callous [L84] 05/07/2012  . Urinary incontinence, nocturnal enuresis [N39.44] 05/07/2012  . Fecal incontinence [R15.9] 05/07/2012  . Systolic murmur [R01.1] 05/07/2012  . Routine health maintenance [Z00.00] 05/07/2012  . Asthma [493] 08/18/2010  . HIV (human immunodeficiency virus  infection) [Z21] 08/18/2010  . Schizophrenia [F20.9] 08/18/2010  . Herpes simplex infection [B00.9] 08/18/2010   Total Time spent with patient: 20 minutes   Past Medical History:  Past Medical History  Diagnosis Date  . GERD (gastroesophageal reflux disease)   . Schizophrenia   . AIDS 12-10-2007  . Herpes simplex   . History of cholecystectomy   . Cataract   . Nonspecific reaction to tuberculin skin test without active tuberculosis 11-2008 [facility]   . Seizures   . Asthmatic bronchitis , chronic   . Hepatitis B infection     followed by ID  . Chronic hyponatremia     Past Surgical History  Procedure Laterality Date  . Cholecystectomy    . Leg surgery      right leg, s/p accident   Family History:  Family History  Problem Relation Age of Onset  . Diabetes Sister   . Cancer Sister     breast  . Diabetes Brother   . Hypertension Brother   . Cancer Mother     lung  . Hypertension Mother   . Heart disease Father    Social History:  History  Alcohol Use No    Comment: Patient denies      History  Drug Use No    Comment: Patient denies    Social History   Social History  . Marital Status: Single    Spouse Name: N/A  . Number of Children: N/A  . Years of Education: N/A   Social History Main Topics  . Smoking status: Current Every Day Smoker -- 2.00 packs/day for 35 years    Types: Cigarettes  Start date: 04/10/1974  . Smokeless tobacco: Never Used     Comment: Previously smoked 2ppd, might start patch  . Alcohol Use: No     Comment: Patient denies   . Drug Use: No     Comment: Patient denies  . Sexual Activity: Not Currently     Comment: pt. declined condoms   Other Topics Concern  . None   Social History Narrative   Additional History:    Sleep: Good  Appetite:  Good   Assessment:   Musculoskeletal: Strength & Muscle Tone: within normal limits Gait & Station: normal Patient leans: N/A   Psychiatric Specialty Exam: Physical Exam   Nursing note and vitals reviewed.   Review of Systems  All other systems reviewed and are negative.   Blood pressure 103/71, pulse 102, temperature 97.7 F (36.5 C), temperature source Oral, resp. rate 20, height 4\' 11"  (1.499 m), weight 63.504 kg (140 lb), SpO2 98 %.Body mass index is 28.26 kg/(m^2).  General Appearance: Casual  Eye Contact::  Fair  Speech:  Normal Rate  Volume:  Normal  Mood:  Euthymic  Affect:  Appropriate  Thought Process:  Linear  Orientation:  Full (Time, Place, and Person)  Thought Content:  Delusions and Paranoid Ideation  Suicidal Thoughts:  No  Homicidal Thoughts:  No  Memory:  Immediate;   Poor Recent;   Poor Remote;   Poor  Judgement:  Impaired  Insight:  Lacking  Psychomotor Activity:  Normal  Concentration:  Poor  Recall:  Poor  Fund of Knowledge:Fair  Language: Fair  Akathisia:  No  Handed:  Right  AIMS (if indicated):     Assets:  Communication Skills Desire for Improvement Financial Resources/Insurance Housing Resilience Social Support  ADL's:  Intact  Cognition: WNL  Sleep:  Number of Hours: 5.25     Current Medications: Current Facility-Administered Medications  Medication Dose Route Frequency Provider Last Rate Last Dose  . acetaminophen (TYLENOL) tablet 650 mg  650 mg Oral Q6H PRN Shari Prows, MD   650 mg at 11/18/14 0924  . albuterol (PROVENTIL HFA;VENTOLIN HFA) 108 (90 BASE) MCG/ACT inhaler 2 puff  2 puff Inhalation Q4H Tarrin Lebow B Dallis Darden, MD   2 puff at 11/20/14 1201  . calcium carbonate (TUMS - dosed in mg elemental calcium) chewable tablet 200 mg of elemental calcium  1 tablet Oral TID WC Jimmy Footman, MD   200 mg of elemental calcium at 11/20/14 1200  . Darunavir Ethanolate (PREZISTA) tablet 800 mg  800 mg Oral Q breakfast Ameya Kutz B Sudie Bandel, MD   800 mg at 11/20/14 0808  . divalproex (DEPAKOTE SPRINKLE) capsule 500 mg  500 mg Oral QHS Vicktoria Muckey B Levelle Edelen, MD   500 mg at 11/19/14 2206  .  emtricitabine-tenofovir (TRUVADA) 200-300 MG per tablet 1 tablet  1 tablet Oral Daily Shari Prows, MD   1 tablet at 11/20/14 1013  . LORazepam (ATIVAN) tablet 2 mg  2 mg Oral Q4H PRN Jimmy Footman, MD   2 mg at 11/19/14 2329  . magnesium hydroxide (MILK OF MAGNESIA) suspension 30 mL  30 mL Oral Daily PRN Shari Prows, MD      . Melene Muller ON 11/21/2014] paliperidone (INVEGA SUSTENNA) injection 156 mg  156 mg Intramuscular Once Zuri Lascala B Caresse Sedivy, MD      . Melene Muller ON 12/18/2014] paliperidone (INVEGA SUSTENNA) injection 234 mg  234 mg Intramuscular Once Azilee Pirro B Zosia Lucchese, MD      . pantoprazole (PROTONIX) EC tablet 40 mg  40 mg Oral Daily Shari Prows, MD   40 mg at 11/20/14 1015  . raltegravir (ISENTRESS) tablet 400 mg  400 mg Oral BID Marcille Barman B Florida Nolton, MD   400 mg at 11/20/14 1013  . risperiDONE (RISPERDAL M-TABS) disintegrating tablet 3 mg  3 mg Oral BID Antario Yasuda B Kaleah Hagemeister, MD   3 mg at 11/20/14 1013  . ritonavir (NORVIR) tablet 100 mg  100 mg Oral Q breakfast Shari Prows, MD   100 mg at 11/20/14 0808  . temazepam (RESTORIL) capsule 15 mg  15 mg Oral QHS Shari Prows, MD   15 mg at 11/19/14 2206  . white petrolatum (VASELINE) gel   Topical PRN Shari Prows, MD        Lab Results: No results found for this or any previous visit (from the past 48 hour(s)).  Physical Findings: AIMS: Facial and Oral Movements Muscles of Facial Expression: None, normal Lips and Perioral Area: None, normal Jaw: None, normal Tongue: None, normal,Extremity Movements Upper (arms, wrists, hands, fingers): None, normal Lower (legs, knees, ankles, toes): None, normal, Trunk Movements Neck, shoulders, hips: None, normal, Overall Severity Severity of abnormal movements (highest score from questions above): None, normal Incapacitation due to abnormal movements: None, normal Patient's awareness of abnormal movements (rate only patient's report): No  Awareness, Dental Status Current problems with teeth and/or dentures?: No Does patient usually wear dentures?: No  CIWA:    COWS:     Treatment Plan Summary: Daily contact with patient to assess and evaluate symptoms and progress in treatment and Medication management   Medical Decision Making:  Established Problem, Stable/Improving (1), Review of Psycho-Social Stressors (1), Review or order clinical lab tests (1), Review of Medication Regimen & Side Effects (2) and Review of New Medication or Change in Dosage (2)   [name] is a 54 year old female with history of schizoaffective disorder admitting for psychotic break.  0. Agitation. Resolved. She has Ativan and Thorazine available  1. Psychosis. The patient was recently discharged from [facility] on a combination of Risperdal Consta 50 mg every 2 weeks next injection 8/11 and oral Risperdal 1 mg at bedtime. We will increase Risperdal to 3 mg twice daily by mouth. We start Invega Sustenna 234 mg. Next dose of 156 on Saturday 8/13.  2. Mood. In addition to psychosis at the patient on admission appeared manic restless and rule out agitated intrusive disorganized. She was continued on Neurontin. We started Depakote but had to discontinue due to high ammonia when given 1500 mg/day. I restarted depakote 500 mg nightly last night, 11/18/2014. Will monitor.   3, HIV. We will continue all antiretrovirals.   4. COPD. She is on inhalers.  5. Anxiety. She is on Vistaril.  6. Constipation. MiraLAX.  7. GERD. We will offer pantoprazole.  8. Insomnia. Restoril is lowered to 15 mg nightly.   9. UA is not suggestive of UTI.   10. Disposition. She will be discharged to home. She will follow up with [facility] ACT team.     Kristine Linea 11/20/2014, 3:33 PM

## 2014-11-20 NOTE — [facility] Group Notes (Signed)
[facility] LCSW Aftercare Discharge Planning Group Note   11/20/2014 10:11 AM  Participation Quality:  Minimal   Mood/Affect:  Flat  Depression Rating:  Unable to rate   Anxiety Rating:  Unable to rate   Thoughts of Suicide:  No Will you contract for safety?   NA  Current AVH:  No  Plan for Discharge/Comments:  Pt states her goal for today is to quit smoking. However, she states she will start again when she gets home. She had difficulties staying on topic. She plans to return home and follow up with her ACT team   Transportation Means: family   Supports: family, ACTT  Rondall Allegra MSW, [address]

## 2014-11-20 NOTE — Progress Notes (Signed)
Patient ID: [name], female   DOB: 20-Jul-1960, 54 y.o.   MRN: [id number]  CSW spoke with pt's Care Coordinator through [place]. [name] [phone].  Daisy Floro Zailah Zagami MSW, LCSWA  11/20/2014 9:04 AM

## 2014-11-20 NOTE — Progress Notes (Signed)
Patient has verbalized understanding of all provided education and is progressing towards treatment goals.

## 2014-11-20 NOTE — Progress Notes (Signed)
Recreation Therapy Notes  Date: 08.12.16 Time: 3:00 pm Location: Craft Room  Group Topic: Self-expression  Goal Area(s) Addresses:  Patient will effectively use art as a means of self-expression. Patient will recognize positive benefit of self-expression. Patient will be able to identify one emotion experienced during group session. Patient will identify use of art/self-expression as a coping skill.  Behavioral Response: Did not attend  Intervention: Two Faces of Me  Activity: Patients were given a blank face worksheet and instructed to draw a line down the middle. On one side of the face they were told to draw or write how they felt when they were admitted. On the other side, they were told to draw or write how they want to feel when they are d/c.  Education: LRT educated patients on different forms of self-expression  Education Outcome: Patient did not attend group.  Clinical Observations/Feedback: Patient did not attend group.  Arlissa Monteverde M, LRT/CTRS 11/20/2014 4:09 PM 

## 2014-11-20 NOTE — Progress Notes (Signed)
Patient ID: [name], female   DOB: 11/20/1960, 54 y.o.   MRN: [id number]  [facility] referral was made. CSW waiting for approval from [facility] admissions.   Daisy Floro Madelyne Millikan MSW, LCSWA  11/20/2014 10:25 AM

## 2014-11-20 NOTE — [facility] Group Notes (Signed)
[facility] Group Notes:  (Nursing/MHT/Case Management/Adjunct)  Date:  11/20/2014  Time:  1:35 PM  Type of Therapy:  Psychoeducational Skills  Participation Level:  Active  Participation Quality:  Intrusive and Monopolizing  Affect:  Anxious, Blunted and Excited  Cognitive:  Disorganized  Insight:  Limited  Engagement in Group:  Engaged, Lacking, Limited and Monopolizing  Modes of Intervention:  Discussion and Education  Summary of Progress/Problems:  Mickey Farber 11/20/2014, 1:35 PM

## 2014-11-20 NOTE — [facility] Group Notes (Signed)
[facility] LCSW Group Therapy  11/20/2014 5:13 PM  Type of Therapy:  Group Therapy  Participation Level:  Did Not Attend   Priyana Mccarey T, MSW, LCSWA 11/20/2014, 5:13 PM  

## 2014-11-20 NOTE — Progress Notes (Addendum)
[name] has been very needy (requested for Gatorade among many of her requests) during this shift before she finally went to sleep after PRN Ativan 2 mg was given at 2329. Patient was up and about with the rolling walker, bilateral lower edema (non pitting) noted, lower extremities floated on pillows while in bed. No fluid restrictions, baseline remains relatively unchanged (hyperactive, hyper verbal, anxious and full of energy).

## 2014-11-21 ENCOUNTER — Encounter: Payer: Self-pay | Admitting: Psychiatry

## 2014-11-21 LAB — VALPROIC ACID LEVEL: Valproic Acid Lvl: 54 ug/mL (ref 50.0–100.0)

## 2014-11-21 LAB — AMMONIA: AMMONIA: 15 umol/L (ref 9–35)

## 2014-11-21 NOTE — [facility] Group Notes (Signed)
[facility] LCSW Group Therapy  11/21/2014 2:12 PM  Type of Therapy:  Group Therapy  Participation Level:  None  Participation Quality:  Intrusive  Affect:  Flat  Cognitive:  Disorganized  Insight:  Limited  Engagement in Therapy:  Limited  Modes of Intervention:  Discussion, Education, Socialization and Support  Summary of Progress/Problems: Pt will identify unhealthy thoughts and how they impact their emotions and behavior. Pt will be encouraged to discuss these thoughts, emotions and behaviors with the group. Adelee attend group briefly. When asked how she was feeling she stated "I slept all night. I even had a wet dream." She stated she needed an ice pack for her foot and left group.   [name] L [name] MSW, LCSWA  11/21/2014, 2:12 PM

## 2014-11-21 NOTE — Progress Notes (Signed)
Patient ID: [name], female   DOB: May 05, 1960, 54 y.o.   MRN: [id number]  Pt is on [facility] wait list as of yesterday.   Daisy Floro Esai Stecklein MSW, LCSWA  11/21/2014 9:04 AM

## 2014-11-21 NOTE — Progress Notes (Signed)
D:  Pt reports that she has been depressed and really wants to go home, states "I don't have any friends and can't nobody change that", during interaction pt was bright and joked around with staff quite a bit, denies SI/HI/AVH, contracts for safety, looking towards discharge.  A:  Emotional support provided, Encouraged pt to continue with treatment plan and attend all group activities, q15 min checks maintained for safety.  R:  Pt is receptive, going to groups, visible in the milieu, interacts appropriately, can be intrusive with other patients and staff sometimes, redirectable, pleasant and cooperative with staff and other patients on the unit.

## 2014-11-21 NOTE — Plan of Care (Signed)
Problem: Alteration in thought process Goal: LTG-Patient has not harmed self or others in at least 2 days Outcome: Progressing No harm to self or others.  Goal: LTG-Patient behavior demonstrates decreased signs psychosis (Patient behavior demonstrates decreased signs of psychosis to the point the patient is safe to return home and continue treatment in an outpatient setting.)  Outcome: Not Progressing Pt is pleasantly psychotic.  Goal: LTG-Patient is able to perceive the environment accurately Outcome: Not Progressing Interacting with peers, comical, and states enjoyment, but disoriented to situation.

## 2014-11-21 NOTE — Progress Notes (Signed)
Patient was on one to one at the beginning of shift.  Patient interacting with peers. Patient attended NA meeting as well.  Patient was placed on close observation for a while, however, she began to show signs of increase anxiety and increase impulsiveness. Patient given Ativan   po.

## 2014-11-21 NOTE — Progress Notes (Signed)
[facility] MD Progress Note  11/21/2014 5:14 PM EMIYAH SPRAGGINS  MRN:  [id number]  Subjective:     The patient has remained hypomanic/manic with very disorganized thought processes. Speech is rapid and pressured but also very difficult to understand and garbled at times. She has been intrusive on the unit and in groups. She is somewhat grandiose. At times, she appears confused. She was alert and oriented to time, place but not fully to situation. At times, she stated that she lives with her brother and other times she stated she has no siblings. No hypersexual behavior hyperreligious thoughts. Appetite is good and the patient slept over 7 hours last night. So far, she has been compliant with medications and denies any physical adverse side effects associated with the medications. She was a very poor historian and unable to give a lot of details with regards to social history. Insight and judgment remain poor. She remains with a 1:1 sitter and needs a walker to ambulate  Per nursing: Patient was on one to one at the beginning of shift. Patient interacting with peers. Patient attended NA meeting as well. Patient was placed on close observation for a while, however, she began to show signs of increase anxiety and increase impulsiveness. Patient given Ativan 2mg  po.    Principal Pro is a 53: blem: Paranoid schizophrenia So he states that Diagnosis:   Patient Active Problem List   Diagnosis Date Noted  . Schizophrenia, paranoid type [F20.0] 11/09/2014  . 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 Asthma, chronic [J45.909]   . HIV disease [B20]   . 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 Paranoid schizophrenia [F20.0]   . Psychosis [F29] 10/30/2014  . Psychoses [F29]   . Hyponatremia [E87.1] 10/22/2014  . Microscopic hematuria [R31.2] 01/29/2014  . Abdominal pain [R10.9] 12/12/2013  . Vulvar irritation [N90.89] 12/12/2013  . COPD (chronic obstructive pulmonary disease) [J44.9]  02/18/2013  . Tobacco use disorder [Z72.0] 02/18/2013  . Hepatitis B infection [B16.9]   . Pre-ulcerative corn or callous [L84] 05/07/2012  . Urinary incontinence, nocturnal enuresis [N39.44] 05/07/2012  . Fecal incontinence [R15.9] 05/07/2012  . Systolic murmur [R01.1] 05/07/2012  . Routine health maintenance [Z00.00] 05/07/2012  . Asthma [493] 08/18/2010  . HIV (human immunodeficiency virus infection) [Z21] 08/18/2010  . Schizophrenia [F20.9] 08/18/2010  . Herpes simplex infection [B00.9] 08/18/2010   Total Time spent with patient: 30 minutes   Past Medical History:  Past Medical History  Diagnosis Date  . GERD (gastroesophageal reflux disease)   . Schizophrenia   . AIDS 12-10-2007  . Herpes simplex   . History of cholecystectomy   . Cataract   . Nonspecific reaction to tuberculin skin test without active tuberculosis 11-2008 [facility]   . Seizures   . Asthmatic bronchitis , chronic   . Hepatitis B infection     followed by ID  . Chronic hyponatremia     Past Surgical History  Procedure Laterality Date  . Cholecystectomy    . Leg surgery      right leg, s/p accident   Family History:  Family History  Problem Relation Age of Onset  . Diabetes Sister   . Cancer Sister     breast  . Diabetes Brother   . Hypertension Brother   . Cancer Mother     lung  . Hypertension Mother   . Heart disease Father    Social History:  History  Alcohol Use No    Comment: Patient denies  History  Drug Use No    Comment: Patient denies    Social History   Social History  . Marital Status: Single    Spouse Name: N/A  . Number of Children: N/A  . Years of Education: N/A   Social History Main Topics  . Smoking status: Current Every Day Smoker -- 2.00 packs/day for 35 years    Types: Cigarettes    Start date: 04/10/1974  . Smokeless tobacco: Never Used     Comment: Previously smoked 2ppd, might start patch  . Alcohol Use: No     Comment: Patient denies   . Drug Use: No      Comment: Patient denies  . Sexual Activity: Not Currently     Comment: pt. declined condoms   Other Topics Concern  . None   Social History Narrative   The patient says she was born and raised in [place]. She says she currently [place] in [place] with her brother. She says she has 1 son and is currently divorced. She was a very poor historian and unable to give any further social history.    Sleep: Good  Appetite:  Good     Musculoskeletal: Strength & Muscle Tone: within normal limits Gait & Station: normal Patient leans: N/A   Psychiatric Specialty Exam: Physical Exam  Nursing note and vitals reviewed.   Review of Systems  Constitutional: Negative.  Negative for fever, chills and weight loss.  HENT: Negative.  Negative for congestion, hearing loss, sore throat and tinnitus.   Eyes: Negative.  Negative for blurred vision, double vision and photophobia.  Respiratory: Negative.  Negative for cough, hemoptysis, shortness of breath and wheezing.   Cardiovascular: Negative.  Negative for chest pain and palpitations.  Gastrointestinal: Negative.  Negative for heartburn, nausea, vomiting, abdominal pain, diarrhea and constipation.  Genitourinary: Negative.  Negative for dysuria, urgency and frequency.  Musculoskeletal: Negative.  Negative for myalgias, back pain, joint pain and neck pain.  Skin: Negative.  Negative for itching and rash.  Neurological: Positive for focal weakness. Negative for dizziness, tingling, tremors, seizures and headaches.       The patient has to use a walker to ambulate. Questionable cognitive decline.  Endo/Heme/Allergies: Negative.  Does not bruise/bleed easily.  All other systems reviewed and are negative.   Blood pressure 100/69, pulse 105, temperature 97.7 F (36.5 C), temperature source Oral, resp. rate 20, height 4\' 11"  (1.499 m), weight 63.504 kg (140 lb), SpO2 98 %.Body mass index is 28.26 kg/(m^2).  General Appearance: Casual  Eye  Contact::  Fair  Speech:  Normal Rate  Volume:  Normal  Mood:  Euthymic  Affect:  Appropriate  Thought Process:  Linear  Orientation:  Full (Time, Place, and Person)  Thought Content:  Delusions and Paranoid Ideation  Suicidal Thoughts:  No  Homicidal Thoughts:  No  Memory:  Immediate;   Poor Recent;   Poor Remote;   Poor  Judgement:  Impaired  Insight:  Lacking  Psychomotor Activity:  Normal  Concentration:  Poor  Recall:  Poor  Fund of Knowledge:Fair  Language: Fair  Akathisia:  No  Handed:  Right  AIMS (if indicated):     Assets:  Communication Skills Desire for Improvement Financial Resources/Insurance Housing Resilience Social Support  ADL's:  Intact  Cognition: WNL  Sleep:  Number of Hours: 7.15     Current Medications: Current Facility-Administered Medications  Medication Dose Route Frequency Provider Last Rate Last Dose  . acetaminophen (TYLENOL) tablet 650 mg  650 mg Oral Q6H PRN Shari Prows, MD   650 mg at 11/18/14 0924  . albuterol (PROVENTIL HFA;VENTOLIN HFA) 108 (90 BASE) MCG/ACT inhaler 2 puff  2 puff Inhalation Q4H Jolanta B Pucilowska, MD   2 puff at 11/21/14 1605  . calcium carbonate (TUMS - dosed in mg elemental calcium) chewable tablet 200 mg of elemental calcium  1 tablet Oral TID WC Jimmy Footman, MD   200 mg of elemental calcium at 11/21/14 1658  . Darunavir Ethanolate (PREZISTA) tablet 800 mg  800 mg Oral Q breakfast Jolanta B Pucilowska, MD   800 mg at 11/21/14 0810  . divalproex (DEPAKOTE SPRINKLE) capsule 500 mg  500 mg Oral QHS Shari Prows, MD   500 mg at 11/20/14 2139  . emtricitabine-tenofovir (TRUVADA) 200-300 MG per tablet 1 tablet  1 tablet Oral Daily Shari Prows, MD   1 tablet at 11/21/14 0915  . LORazepam (ATIVAN) tablet 2 mg  2 mg Oral Q4H PRN Jimmy Footman, MD   2 mg at 11/21/14 1604  . magnesium hydroxide (MILK OF MAGNESIA) suspension 30 mL  30 mL Oral Daily PRN Shari Prows,  MD      . Melene Muller ON 12/18/2014] paliperidone (INVEGA SUSTENNA) injection 234 mg  234 mg Intramuscular Once Jolanta B Pucilowska, MD      . pantoprazole (PROTONIX) EC tablet 40 mg  40 mg Oral Daily Jolanta B Pucilowska, MD   40 mg at 11/21/14 0915  . raltegravir (ISENTRESS) tablet 400 mg  400 mg Oral BID Shari Prows, MD   400 mg at 11/21/14 0915  . risperiDONE (RISPERDAL M-TABS) disintegrating tablet 3 mg  3 mg Oral BID Shari Prows, MD   3 mg at 11/21/14 0915  . ritonavir (NORVIR) tablet 100 mg  100 mg Oral Q breakfast Jolanta B Pucilowska, MD   100 mg at 11/21/14 0810  . temazepam (RESTORIL) capsule 15 mg  15 mg Oral QHS Shari Prows, MD   15 mg at 11/20/14 2140  . white petrolatum (VASELINE) gel   Topical PRN Shari Prows, MD        Lab Results:  Results for orders placed or performed during the hospital encounter of 11/09/14 (from the past 48 hour(s))  Valproic acid level     Status: None   Collection Time: 11/21/14  9:01 AM  Result Value Ref Range   Valproic Acid Lvl 54 50.0 - 100.0 ug/mL  Ammonia     Status: None   Collection Time: 11/21/14  9:01 AM  Result Value Ref Range   Ammonia 15 9 - 35 umol/L    Physical Findings: AIMS: Facial and Oral Movements Muscles of Facial Expression: None, normal Lips and Perioral Area: None, normal Jaw: None, normal Tongue: None, normal,Extremity Movements Upper (arms, wrists, hands, fingers): None, normal Lower (legs, knees, ankles, toes): None, normal, Trunk Movements Neck, shoulders, hips: None, normal, Overall Severity Severity of abnormal movements (highest score from questions above): None, normal Incapacitation due to abnormal movements: None, normal Patient's awareness of abnormal movements (rate only patient's report): No Awareness, Dental Status Current problems with teeth and/or dentures?: No Does patient usually wear dentures?: No         Diagnosis  Schizophrenia, paranoid type  Delusional  disorder  HIV  Asthma/COPD  GERD Hep B infection  History of herpes simplex virus  Severe: Chronic multiple medical problems, inability to care for self independently  Miss Vandervort is a 54 year old female with history  of schizoaffective disorder admitting for psychotic break.  1) Schizophrenia and Delusional Disorder:.She remains hypomanic. The patient was recently discharged from [facility] on a combination of Risperdal Consta 50 mg every 2 weeks next injection 8/11 and oral Risperdal 1 mg at bedtime. Increased  Risperdal to 3 mg twice daily by mouth. We start Invega Sustenna 234 mg. Next dose of 156 on Saturday 8/13. She is also on Depakote 500mg  po daily as she had to discontinue a higher dose of Depakote due to high ammonia. She has continued on Neuronti. VPA was 54 on 500mg  dosage. The patient has also Vitaril for anxiety.  2)  HIV: She does follow with an ID physician. Last CD4 count was 420 in April 2016. We'll continue all antiretroviral's including Truvada 1 tablet by mouth daily and Ritonavir 100 mg by mouth daily.   3). COPD. She is on inhalers. No recent exacerbation of COPD  4) Constipation. MiraLAX.  5) GERD. We will offer pantoprazole.  6) Insomnia: She wil continue on Restoril 15 mg nightly.   7) UA is not suggestive of UTI.   8). Disposition. She will be discharged to home. She will follow up with [facility] team.    Treatment Plan Summary: Daily contact with patient to assess and evaluate symptoms and progress in treatment and Medication management   Medical Decision Making:  Established Problem, Stable/Improving (1), Review of Psycho-Social Stressors (1), Review or order clinical lab tests (1), Order AIMS Test (2) and Review of Medication Regimen & Side Effects (2)      KAPUR,AARTI KAMAL 11/21/2014, 5:14 PM

## 2014-11-22 NOTE — Progress Notes (Signed)
[facility] MD Progress Note  11/22/2014 1:34 PM DEAISHA WELBORN  MRN:  [id number]  Subjective:    The patient remains with a one-to-one sitter. She remains pleasantly hypomanic with rapid, pressured speech at times. Thought processes are very disorganized. She is not as intrusive as she was yesterday and is easily redirectable. She describes her mood as being "very good" and at times she is laughing and joking with staff and peers. She gives very inconsistent answers questions asked. At one point, she was talking about living in [place] and another time she talked about living with her brother in [place]. She did express a lot of anger towards her brother for being very controlling. She has been compliant with medications and is eating well. She does attend groups but is intrusive during the groups. She denies any current active or passive suicidal thoughts. She is not endorsing any auditory or visual hallucinations. The patient only slept 3.75 hours last night compared to 7 hours the night before.. Vital signs stable. She received individual sustain an injection of 156 mg IM yesterday. She has been compliant with all medications and denies any physical adverse side effects. No somatic complaints and she denies any respiratory problems.  Per nursing, the patient has been cooperative with care and remains with a one-to-one sitter. She is intrusive at times but easily redirectable   Principal Pro is a 54: blem: Paranoid schizophrenia So he states that Diagnosis:   Patient Active Problem List   Diagnosis Date Noted  . Schizophrenia, paranoid type [F20.0] 11/09/2014  . 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 Asthma, chronic [J45.909]   . HIV disease [B20]   . 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 Paranoid schizophrenia [F20.0]   . Psychosis [F29] 10/30/2014  . Psychoses [F29]   . Hyponatremia [E87.1] 10/22/2014  . Microscopic hematuria [R31.2] 01/29/2014  . Abdominal pain  [R10.9] 12/12/2013  . Vulvar irritation [N90.89] 12/12/2013  . COPD (chronic obstructive pulmonary disease) [J44.9] 02/18/2013  . Tobacco use disorder [Z72.0] 02/18/2013  . Hepatitis B infection [B16.9]   . Pre-ulcerative corn or callous [L84] 05/07/2012  . Urinary incontinence, nocturnal enuresis [N39.44] 05/07/2012  . Fecal incontinence [R15.9] 05/07/2012  . Systolic murmur [R01.1] 05/07/2012  . Routine health maintenance [Z00.00] 05/07/2012  . Asthma [493] 08/18/2010  . HIV (human immunodeficiency virus infection) [Z21] 08/18/2010  . Schizophrenia [F20.9] 08/18/2010  . Herpes simplex infection [B00.9] 08/18/2010   Total Time spent with patient: 30 minutes   Past Medical History:  Past Medical History  Diagnosis Date  . GERD (gastroesophageal reflux disease)   . Schizophrenia   . AIDS 12-10-2007  . Herpes simplex   . History of cholecystectomy   . Cataract   . Nonspecific reaction to tuberculin skin test without active tuberculosis 11-2008 [facility]   . Seizures   . Asthmatic bronchitis , chronic   . Hepatitis B infection     followed by ID  . Chronic hyponatremia     Past Surgical History  Procedure Laterality Date  . Cholecystectomy    . Leg surgery      right leg, s/p accident   Family History:  Family History  Problem Relation Age of Onset  . Diabetes Sister   . Cancer Sister     breast  . Diabetes Brother   . Hypertension Brother   . Cancer Mother     lung  . Hypertension Mother   . Heart disease Father  Social History:  History  Alcohol Use No    Comment: Patient denies      History  Drug Use No    Comment: Patient denies    Social History   Social History  . Marital Status: Single    Spouse Name: N/A  . Number of Children: N/A  . Years of Education: N/A   Social History Main Topics  . Smoking status: Current Every Day Smoker -- 2.00 packs/day for 35 years    Types: Cigarettes    Start date: 04/10/1974  . Smokeless tobacco: Never Used      Comment: Previously smoked 2ppd, might start patch  . Alcohol Use: No     Comment: Patient denies   . Drug Use: No     Comment: Patient denies  . Sexual Activity: Not Currently     Comment: pt. declined condoms   Other Topics Concern  . None   Social History Narrative   The patient says she was born and raised in [place]. She says she currently lives in [place] with her brother. She says she has 1 son and is currently divorced. She was a very poor historian and unable to give any further social history.    Sleep: Good  Appetite:  Good     Musculoskeletal: Strength & Muscle Tone: within normal limits Gait & Station: normal Patient leans: N/A   Psychiatric Specialty Exam: Physical Exam  Nursing note and vitals reviewed.   Review of Systems  Constitutional: Negative.  Negative for fever, chills, weight loss and malaise/fatigue.  HENT: Negative.  Negative for congestion, hearing loss, sore throat and tinnitus.   Eyes: Negative.  Negative for blurred vision, double vision and photophobia.  Respiratory: Negative.  Negative for cough, hemoptysis, shortness of breath and wheezing.   Cardiovascular: Negative.  Negative for chest pain, palpitations and orthopnea.  Gastrointestinal: Negative.  Negative for heartburn, nausea, vomiting, abdominal pain, diarrhea, constipation and blood in stool.  Genitourinary: Negative.  Negative for dysuria, urgency and frequency.  Musculoskeletal: Negative.  Negative for myalgias, back pain, joint pain and neck pain.  Skin: Negative.  Negative for itching and rash.  Neurological: Positive for focal weakness. Negative for dizziness, tingling, tremors, sensory change, seizures, loss of consciousness, weakness and headaches.       She has difficulty ambulating and uses a walker.  Endo/Heme/Allergies: Negative.  Does not bruise/bleed easily.  All other systems reviewed and are negative.   Blood pressure 101/55, pulse 91, temperature 97.5 F  (36.4 C), temperature source Oral, resp. rate 20, height 4\' 11"  (1.499 m), weight 63.504 kg (140 lb), SpO2 98 %.Body mass index is 28.26 kg/(m^2).  General Appearance: Casual  Eye Contact::  Fair  Speech:  Normal Rate  Volume:  Normal  Mood:  "Great"  Affect:  Labile  Thought Process:  Linear  Orientation:  Full (Time, Place, and Person)  Thought Content:  Delusions and Paranoid Ideation  Suicidal Thoughts:  No  Homicidal Thoughts:  No  Memory:  Immediate;   Poor Recent;   Poor Remote;   Poor  Judgement:  Impaired  Insight:  Lacking  Psychomotor Activity:  Normal  Concentration:  Poor  Recall:  Poor  Fund of Knowledge:Fair  Language: Fair  Akathisia:  No  Handed:  Right  AIMS (if indicated):     Assets:  Communication Skills Desire for Improvement Financial Resources/Insurance Housing Resilience Social Support  ADL's:  Intact  Cognition: WNL  Sleep:  Number of Hours: 3.75  Current Medications: Current Facility-Administered Medications  Medication Dose Route Frequency Provider Last Rate Last Dose  . acetaminophen (TYLENOL) tablet 650 mg  650 mg Oral Q6H PRN Shari Prows, MD   650 mg at 11/18/14 0924  . albuterol (PROVENTIL HFA;VENTOLIN HFA) 108 (90 BASE) MCG/ACT inhaler 2 puff  2 puff Inhalation Q4H Shari Prows, MD   2 puff at 11/22/14 1209  . calcium carbonate (TUMS - dosed in mg elemental calcium) chewable tablet 200 mg of elemental calcium  1 tablet Oral TID WC Jimmy Footman, MD   200 mg of elemental calcium at 11/22/14 1208  . Darunavir Ethanolate (PREZISTA) tablet 800 mg  800 mg Oral Q breakfast Jolanta B Pucilowska, MD   800 mg at 11/22/14 0835  . divalproex (DEPAKOTE SPRINKLE) capsule 500 mg  500 mg Oral QHS Shari Prows, MD   500 mg at 11/21/14 2113  . emtricitabine-tenofovir (TRUVADA) 200-300 MG per tablet 1 tablet  1 tablet Oral Daily Shari Prows, MD   1 tablet at 11/22/14 0953  . LORazepam (ATIVAN) tablet 2 mg   2 mg Oral Q4H PRN Jimmy Footman, MD   2 mg at 11/22/14 1230  . magnesium hydroxide (MILK OF MAGNESIA) suspension 30 mL  30 mL Oral Daily PRN Shari Prows, MD      . Melene Muller ON 12/18/2014] paliperidone (INVEGA SUSTENNA) injection 234 mg  234 mg Intramuscular Once Jolanta B Pucilowska, MD      . pantoprazole (PROTONIX) EC tablet 40 mg  40 mg Oral Daily Jolanta B Pucilowska, MD   40 mg at 11/22/14 0953  . raltegravir (ISENTRESS) tablet 400 mg  400 mg Oral BID Shari Prows, MD   400 mg at 11/22/14 0953  . risperiDONE (RISPERDAL M-TABS) disintegrating tablet 3 mg  3 mg Oral BID Shari Prows, MD   3 mg at 11/22/14 0952  . ritonavir (NORVIR) tablet 100 mg  100 mg Oral Q breakfast Jolanta B Pucilowska, MD   100 mg at 11/22/14 0835  . temazepam (RESTORIL) capsule 15 mg  15 mg Oral QHS Shari Prows, MD   15 mg at 11/21/14 2114  . white petrolatum (VASELINE) gel   Topical PRN Shari Prows, MD        Lab Results:  Results for orders placed or performed during the hospital encounter of 11/09/14 (from the past 48 hour(s))  Valproic acid level     Status: None   Collection Time: 11/21/14  9:01 AM  Result Value Ref Range   Valproic Acid Lvl 54 50.0 - 100.0 ug/mL  Ammonia     Status: None   Collection Time: 11/21/14  9:01 AM  Result Value Ref Range   Ammonia 15 9 - 35 umol/L    Physical Findings: AIMS: Facial and Oral Movements Muscles of Facial Expression: None, normal Lips and Perioral Area: None, normal Jaw: None, normal Tongue: None, normal,Extremity Movements Upper (arms, wrists, hands, fingers): None, normal Lower (legs, knees, ankles, toes): None, normal, Trunk Movements Neck, shoulders, hips: None, normal, Overall Severity Severity of abnormal movements (highest score from questions above): None, normal Incapacitation due to abnormal movements: None, normal Patient's awareness of abnormal movements (rate only patient's report): No Awareness,  Dental Status Current problems with teeth and/or dentures?: No Does patient usually wear dentures?: No         Diagnosis  Schizophrenia, paranoid type  Delusional disorder  HIV  Asthma/COPD  GERD Hep B infection  History of herpes  simplex virus  Severe: Chronic multiple medical problems, inability to care for self independently  Miss Benegas is a 54 year old female with history of schizoaffective disorder admitting for psychotic break.  1) Schizophrenia and Delusional Disorder:.She remains hypomanic. The patient was recently discharged from [facility] on a combination of Risperdal Consta 50 mg every 2 weeks next injection 8/11 and oral Risperdal 1 mg at bedtime. Risperdal has been increased to 3 mg twice daily by mouth. We start Invega Sustenna 234 mg. Next dose of 156 on Saturday 8/13. She is also on Depakote  po daily as she had to discontinue a higher dose of Depakote due to high ammonia. She has continued on Neuronti. VPA was 54 on  dosage. The patient has also Vitaril for anxiety.  2)  HIV: She does follow with an ID physician. Last CD4 count was 420 in April 2016. We'll continue all antiretroviral's including Truvada 1 tablet by mouth daily and Ritonavir 100 mg by mouth daily.   3). COPD. She is on inhalers. No recent exacerbation of COPD  4) Constipation. MiraLAX.  5) GERD. We will offer pantoprazole.  6) Insomnia: She wil continue on Restoril 15 mg nightly but may need to increase Restoril if she continues to have problems with insomnia. She is sleeping on and off during the daytime as well  7) UA is not suggestive of UTI.   8). Disposition. She will be discharged to home. She will follow up with [facility] team.    Treatment Plan Summary: Daily contact with patient to assess and evaluate symptoms and progress in treatment and Medication management   Medical Decision Making:  Established Problem, Stable/Improving (1), Review of  Psycho-Social Stressors (1), Review or order clinical lab tests (1), Order AIMS Test (2) and Review of Medication Regimen & Side Effects (2)      Kaitlyne Friedhoff KAMAL 11/22/2014, 1:34 PM

## 2014-11-22 NOTE — Progress Notes (Signed)
D) Patient intermittently visible in the milieu. She is sleeping on and off throughout day. While awake she presents in with a bright mood and congruent affect. Her speech is loud and at times difficult to comprehend. Her thought process tangential and disorganized. During the nursing assessment endorsed moderate anxiety and agitation. She requested a PRN and was given ativan  PO PRN for agitation. She denied suicidal ideation and denies AH/VH. Her appetite and fluid intake is well maintained. She attends to her ADL's with assistance. She remains on 1:1 for safety. She had no angry outbursts and no hostile/aggressive behavior today.   A) Staff continues to provide safe therapeutic environment. She is provided with PRN medication as ordered. She is given education on prescribed medication and importance participating in therapeutic groups. She denies pain and has no other physical complaints at this time.   R) Patient is receptive to all education and needs additional reinforcement. She remains on 1:1 monitoring for safety and she has maintained safe therapeutic boundaries with others. She is encouraged to approach staff if feeling unsafe. She verbalized understanding of all provided education.

## 2014-11-22 NOTE — Progress Notes (Signed)
Care taken over at 0315, at that time patient in the bed , eyes closed resting quietly and has been since. No issues to report on shift at this time. Sitter remains at bedside for safety at all times.

## 2014-11-22 NOTE — Progress Notes (Signed)
  D) Patient visible in the milieu. She presents loud, theatrical and animated. During the nursing assessment she denies anxiety, denied suicidal ideation and denies AH/VH. Her appetite and fluid intake is well maintained. She attends to her ADL's with assistance. She remains on 1:1 for safety. She had no angry outbursts and no hostile/aggressive behaviors throughout the day.   A) Staff continues to provide safe therapeutic environment. She is provided with PRN medication as ordered. She is given education on prescribed medication and importance participating in therapeutic groups. She denies pain and has no other physical complaints at this time.   R) Patient is receptive to all education and needs additional reinforcement. She remains on 1:1 monitoring for safety and she has maintained safe therapeutic boundaries with others. She is encouraged to approach staff if feeling unsafe. She verbalized understanding of all provided education.

## 2014-11-22 NOTE — Progress Notes (Signed)
Pt remains with sitter for safety. Pt using her walker. Pt observed walking fast on the unit. Loud at times. Attended group med compliant.

## 2014-11-23 MED ORDER — PALIPERIDONE PALMITATE 234 MG/1.5ML IM SUSP: 234.0000 mg | Freq: Once | INTRAMUSCULAR | Status: DC

## 2014-11-23 MED ORDER — DIVALPROEX SODIUM 125 MG PO CSDR: 500.0000 mg | DELAYED_RELEASE_CAPSULE | Freq: Every day | ORAL | Status: DC

## 2014-11-23 MED ORDER — PANTOPRAZOLE SODIUM 40 MG PO TBEC: 40.0000 mg | DELAYED_RELEASE_TABLET | Freq: Every day | ORAL | Status: DC

## 2014-11-23 MED ORDER — RISPERIDONE 3 MG PO TBDP: 3.0000 mg | ORAL_TABLET | Freq: Two times a day (BID) | ORAL | Status: AC

## 2014-11-23 NOTE — Progress Notes (Signed)
Pt pleasant and cooperative. No negative issues noted. Ambulates with walker. Med and group compliant. Encouragement and support offered. Will continue to assess and monitor for safety

## 2014-11-23 NOTE — Plan of Care (Signed)
Problem: Alteration in thought process Goal: STG-Patient does not respond to command hallucinations Outcome: Progressing Does not respond to hallucinations  Problem: Ineffective individual coping Goal: STG: Patient will remain free from self harm Outcome: Progressing No self harm reported or observed

## 2014-11-23 NOTE — Discharge Summary (Addendum)
Physician Discharge Summary Note  Patient:  [name] is an 54 y.o., female MRN:  [id number] DOB:  01/09/1961 Patient phone:  [phone] (home)  Patient address:   [address] [place] [address],  Total Time spent with patient: 30 minutes  Date of Admission:  11/09/2014 Date of Discharge: 11/24/2014  Reason for Admission:  Psychotic break.  Identifying data. [name] is a 54 year old female with a history of schizoaffective disorder.  Chief complaint. "I put a job application in."  History of present illness. [name] has a long history of schizoaffective disorder. She is in the care of [facility]. 3 weeks ago Neurontin was added to her medication and the patient's brother felt that it changed patient's behavior. He became agitated, intrusive, disorganized, unable to care for herself, and suicide. She was briefly hospitalized at [facility],. She returns to the hospital in apparently manic episode. I cannot obtain any information from the patient it is mostly obtained from the chart we were unable to contact her brother as a few. It is unclear if the patient has been compliant with her oral medication but she is maintained on Risperdal Consta injection. It is difficult to know whether that the patient has exacerbation of bipolar illness or if Risperdal Consta has not been adequate treatment. In my opinion it is not advisable medication. In addition her oral Risperdal was lowered to 1 mg a night. It is quite possible that the patient didn't take any. It is worrisome because she has HIV infection and needs to stay on her medication regimen. She is completely disorganized and unable to hold a conversation she is oriented to person and year only. She believes that she is been applying for a job here. She wants to retire to 21. She is requesting a piata for a birthday party with [employer]. She is disorganized, pleasantly confused and intrusive in groups. She denies  thoughts of hurting herself or others. She denies symptoms of depression or anxiety. She denies alcohol or substance use.  Past psychiatric history. I see one prior hospitalization at [facility]. As she still has active I imagine that she's been sick for a while. The patient is unable to provide any information about her past medications. She denies ever attempting suicide.  Family psychiatric history. Unknown.  Social history. She is disabled from mental illness. She lives with her brother and her sister. She is in the of [facility] act team.   Principal Problem: Paranoid schizophrenia Discharge Diagnoses: Patient Active Problem List   Diagnosis Date Noted  . 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 Asthma, chronic [J45.909]   . HIV disease [B20]   . 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 Paranoid schizophrenia [F20.0]   . Psychosis [F29] 10/30/2014  . Psychoses [F29]   . Hyponatremia [E87.1] 10/22/2014  . Microscopic hematuria [R31.2] 01/29/2014  . Abdominal pain [R10.9] 12/12/2013  . Vulvar irritation [N90.89] 12/12/2013  . COPD (chronic obstructive pulmonary disease) [J44.9] 02/18/2013  . Tobacco use disorder [Z72.0] 02/18/2013  . Hepatitis B infection [B16.9]   . Pre-ulcerative corn or callous [L84] 05/07/2012  . Urinary incontinence, nocturnal enuresis [N39.44] 05/07/2012  . Fecal incontinence [R15.9] 05/07/2012  . Systolic murmur [R01.1] 05/07/2012  . Routine health maintenance [Z00.00] 05/07/2012  . Asthma [493] 08/18/2010  . HIV (human immunodeficiency virus infection) [Z21] 08/18/2010  . Schizophrenia [F20.9] 08/18/2010  . Herpes simplex infection [B00.9] 08/18/2010    Musculoskeletal: Strength &  Muscle Tone: within normal limits Gait & Station: normal Patient leans: N/A  Psychiatric Specialty Exam: Physical Exam  Nursing note and vitals reviewed.   Review of Systems  All other systems reviewed and are negative.   Blood pressure  98/65, pulse 67, temperature 97.3 F (36.3 C), temperature source Oral, resp. rate 20, height 4\' 11"  (1.499 m), weight 63.504 kg (140 lb), SpO2 98 %.Body mass index is 28.26 kg/(m^2).  See SRA.                                                  Sleep:  Number of Hours: 6.75   Have you used any form of tobacco in the last 30 days? (Cigarettes, Smokeless Tobacco, Cigars, and/or Pipes): Yes  Has this patient used any form of tobacco in the last 30 days? (Cigarettes, Smokeless Tobacco, Cigars, and/or Pipes) Yes, A prescription for an FDA-approved tobacco cessation medication was offered at discharge and the patient refused  Past Medical History:  Past Medical History  Diagnosis Date  . GERD (gastroesophageal reflux disease)   . Schizophrenia   . AIDS 12-10-2007  . Herpes simplex   . History of cholecystectomy   . Cataract   . Nonspecific reaction to tuberculin skin test without active tuberculosis 11-2008 [facility]   . Seizures   . Asthmatic bronchitis , chronic   . Hepatitis B infection     followed by ID  . Chronic hyponatremia     Past Surgical History  Procedure Laterality Date  . Cholecystectomy    . Leg surgery      right leg, s/p accident   Family History:  Family History  Problem Relation Age of Onset  . Diabetes Sister   . Cancer Sister     breast  . Diabetes Brother   . Hypertension Brother   . Cancer Mother     lung  . Hypertension Mother   . Heart disease Father    Social History:  History  Alcohol Use No    Comment: Patient denies      History  Drug Use No    Comment: Patient denies    Social History   Social History  . Marital Status: Single    Spouse Name: N/A  . Number of Children: N/A  . Years of Education: N/A   Social History Main Topics  . Smoking status: Current Every Day Smoker -- 2.00 packs/day for 35 years    Types: Cigarettes    Start date: 04/10/1974  . Smokeless tobacco: Never Used     Comment: Previously smoked  2ppd, might start patch  . Alcohol Use: No     Comment: Patient denies   . Drug Use: No     Comment: Patient denies  . Sexual Activity: Not Currently     Comment: pt. declined condoms   Other Topics Concern  . None   Social History Narrative   The patient says she was born and raised in [place]. She says she currently [place] her brother. She says she has 1 son and is currently divorced. She was a very poor historian and unable to give any further social history.    Past Psychiatric History: Hospitalizations:  Outpatient Care:  Substance Abuse Care:  Self-Mutilation:  Suicidal Attempts:  Violent Behaviors:   Risk to Self: Is patient  at risk for suicide?: No Risk to Others:   Prior Inpatient Therapy:   Prior Outpatient Therapy:    Level of Care:  OP  Hospital Course:    Miss Sanda is a 54 year old female with history of schizoaffective disorder admitted for psychotic break.  1) Mood and psychosis. The patient was continued on Risperdal 3 mg twice daily for psychosis.She was given two initial [name] injections. Next dose of 234 mg is to be given on 12/18/2014. She was also startedon Depakote 500 mg daily. (VPA 54). The patient could not tolerate higher dose of Depakote due to elevated ammonia.She has continued on Neurontin.  2) HIV: She does follow with an ID physician. Last CD4 count was 420 in April 2016. We'll continue all antiretroviral's including Truvada 1 tablet by mouth daily and Ritonavir 100 mg by mouth daily.   3). COPD. She is on inhalers. No recent exacerbation of COPD  4) Constipation. MiraLAX.  5) GERD. We will offer pantoprazole.  6) Insomnia: She wil continue on Ambien at home.   7) UA is not suggestive of UTI.   8) PT. Rolling walker and follow up with orthopedist recommended.   9) Disposition. She was discharged to home with family. She will follow up with [facility] team.  Consults:  None and  ID  Significant Diagnostic Studies:  None  Discharge Vitals:   Blood pressure 98/65, pulse 67, temperature 97.3 F (36.3 C), temperature source Oral, resp. rate 20, height 4\' 11"  (1.499 m), weight 63.504 kg (140 lb), SpO2 98 %. Body mass index is 28.26 kg/(m^2). Lab Results:   Results for orders placed or performed during the hospital encounter of 11/09/14 (from the past 72 hour(s))  Valproic acid level     Status: None   Collection Time: 11/21/14  9:01 AM  Result Value Ref Range   Valproic Acid Lvl 54 50.0 - 100.0 ug/mL  Ammonia     Status: None   Collection Time: 11/21/14  9:01 AM  Result Value Ref Range   Ammonia 15 9 - 35 umol/L    Physical Findings: AIMS: Facial and Oral Movements Muscles of Facial Expression: Minimal Lips and Perioral Area: Minimal Jaw: Minimal Tongue: Minimal,Extremity Movements Upper (arms, wrists, hands, fingers): None, normal Lower (legs, knees, ankles, toes): None, normal, Trunk Movements Neck, shoulders, hips: None, normal, Overall Severity Severity of abnormal movements (highest score from questions above): None, normal Incapacitation due to abnormal movements: None, normal Patient's awareness of abnormal movements (rate only patient's report): No Awareness, Dental Status Current problems with teeth and/or dentures?: No Does patient usually wear dentures?: Yes  CIWA:    COWS:      See Psychiatric Specialty Exam and Suicide Risk Assessment completed by Attending Physician prior to discharge.  Discharge destination:  Home  Is patient on multiple antipsychotic therapies at discharge:  Yes,   Do you recommend tapering to monotherapy for antipsychotics?  Yes   Has Patient had three or more failed trials of antipsychotic monotherapy by history:  Yes,   Antipsychotic medications that previously failed include:   1.  Haldol., 2.  Risperdal. and 3.  Zyprexa.    Recommended Plan for Multiple Antipsychotic Therapies: Taper to monotherapy as  described:  from oral Risperdal to [place] sustenna.  Discharge Instructions    Diet - low sodium heart healthy    Complete by:  As directed      Increase activity slowly    Complete by:  As directed  Medication List    STOP taking these medications        Amantadine HCl 100 MG tablet     clonazePAM 1 MG tablet  Commonly known as:  KLONOPIN     polyethylene glycol packet  Commonly known as:  MIRALAX / GLYCOLAX     polyethylene glycol powder powder  Commonly known as:  GLYCOLAX/MIRALAX     QUEtiapine 25 MG tablet  Commonly known as:  SEROQUEL     QUEtiapine 50 MG tablet  Commonly known as:  SEROQUEL     risperiDONE microspheres 50 MG injection  Commonly known as:  RISPERDAL CONSTA     traZODone 50 MG tablet  Commonly known as:  DESYREL      TAKE these medications      Indication   albuterol 108 (90 BASE) MCG/ACT inhaler  Commonly known as:  PROVENTIL HFA;VENTOLIN HFA  Inhale 2 puffs into the lungs every 6 (six) hours as needed for wheezing or shortness of breath.      beclomethasone 80 MCG/ACT inhaler  Commonly known as:  QVAR  Inhale 1 puff into the lungs 2 (two) times daily.      benztropine 0.5 MG tablet  Commonly known as:  COGENTIN  Take 0.5 mg by mouth 2 (two) times daily.      Darunavir Ethanolate 800 MG tablet  Commonly known as:  PREZISTA  Take 1 tablet (800 mg total) by mouth daily.   Indication:  HIV Disease     divalproex 125 MG capsule  Commonly known as:  DEPAKOTE SPRINKLE  Take 4 capsules (500 mg total) by mouth at bedtime.   Indication:  Manic Phase of Manic-Depression     emtricitabine-tenofovir 200-300 MG per tablet  Commonly known as:  TRUVADA  Take 1 tablet by mouth daily.   Indication:  HIV Disease     gabapentin 400 MG capsule  Commonly known as:  NEURONTIN  Take 400 mg by mouth 2 (two) times daily.      hydrOXYzine 25 MG tablet  Commonly known as:  ATARAX/VISTARIL  Take 25 mg by mouth every 12 (twelve) hours as  needed for anxiety (or sleep).      paliperidone 234 MG/1.5ML Susp injection  Commonly known as:  INVEGA SUSTENNA  Inject 234 mg into the muscle once.  Start taking on:  12/18/2014   Indication:  Schizophrenia     pantoprazole 40 MG tablet  Commonly known as:  PROTONIX  Take 1 tablet (40 mg total) by mouth daily.   Indication:  Gastroesophageal Reflux Disease     raltegravir 400 MG tablet  Commonly known as:  ISENTRESS  Take 1 tablet (400 mg total) by mouth 2 (two) times daily.   Indication:  HIV Disease     ranitidine 150 MG tablet  Commonly known as:  ZANTAC  Take 150 mg by mouth 2 (two) times daily.      risperidone 3 MG disintegrating tablet  Commonly known as:  RISPERDAL M-TABS  Take 1 tablet (3 mg total) by mouth 2 (two) times daily.   Indication:  Schizophrenia     ritonavir 100 MG Tabs tablet  Commonly known as:  NORVIR  Take 1 tablet (100 mg total) by mouth daily.   Indication:  HIV Disease     zolpidem 10 MG tablet  Commonly known as:  AMBIEN  Take 5 mg by mouth at bedtime as needed for sleep.          Follow-up recommendations:  Activity:  as tolerated. Diet:  low sodium heart healthy. Other:  keep follow-up appointments.  Comments:    Total Discharge Time: 35 min.  Signed: Kristine Linea 11/23/2014, 2:37 PM
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
## 2014-11-23 NOTE — [facility] Suicide Risk Assessment (Signed)
[facility] Discharge Suicide Risk Assessment   Demographic Factors:  NA  Total Time spent with patient: 30 minutes  Musculoskeletal: Strength & Muscle Tone: within normal limits Gait & Station: normal Patient leans: N/A  Psychiatric Specialty Exam: Physical Exam  Nursing note and vitals reviewed.   Review of Systems  All other systems reviewed and are negative.   Blood pressure 98/65, pulse 67, temperature 97.3 F (36.3 C), temperature source Oral, resp. rate 20, height 4\' 11"  (1.499 m), weight 63.504 kg (140 lb), SpO2 98 %.Body mass index is 28.26 kg/(m^2).  General Appearance: Casual  Eye Contact::  Good  Speech:  Clear and Coherent409  Volume:  Normal  Mood:  Euthymic  Affect:  Appropriate  Thought Process:  Linear  Orientation:  Full (Time, Place, and Person)  Thought Content:  Delusions  Suicidal Thoughts:  No  Homicidal Thoughts:  No  Memory:  Immediate;   Fair Recent;   Fair Remote;   Fair  Judgement:  Fair  Insight:  Fair  Psychomotor Activity:  Normal  Concentration:  Fair  Recall:  [employer] of Knowledge:Fair  Language: Fair  Akathisia:  No  Handed:  Right  AIMS (if indicated):     Assets:  Communication Skills Desire for Improvement Financial Resources/Insurance Housing Physical Health  Sleep:  Number of Hours: 6.75  Cognition: WNL  ADL's:  Intact   Have you used any form of tobacco in the last 30 days? (Cigarettes, Smokeless Tobacco, Cigars, and/or Pipes): Yes  Has this patient used any form of tobacco in the last 30 days? (Cigarettes, Smokeless Tobacco, Cigars, and/or Pipes) Yes, A prescription for an FDA-approved tobacco cessation medication was offered at discharge and the patient refused  Mental Status Per Nursing Assessment::   On Admission:  NA  Current Mental Status by Physician: NA  Loss Factors: NA  Historical Factors: NA  Risk Reduction Factors:   Sense of responsibility to family, Living with another person, especially a  relative, Positive social support and Positive therapeutic relationship  Continued Clinical Symptoms:  Schizophrenia:   Paranoid or undifferentiated type  Cognitive Features That Contribute To Risk:  None    Suicide Risk:  Minimal: No identifiable suicidal ideation.  Patients presenting with no risk factors but with morbid ruminations; may be classified as minimal risk based on the severity of the depressive symptoms  Principal Problem: Paranoid schizophrenia Discharge Diagnoses:  Patient Active Problem List   Diagnosis Date Noted  . 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 Asthma, chronic [J45.909]   . HIV disease [B20]   . 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 Paranoid schizophrenia [F20.0]   . Psychosis [F29] 10/30/2014  . Psychoses [F29]   . Hyponatremia [E87.1] 10/22/2014  . Microscopic hematuria [R31.2] 01/29/2014  . Abdominal pain [R10.9] 12/12/2013  . Vulvar irritation [N90.89] 12/12/2013  . COPD (chronic obstructive pulmonary disease) [J44.9] 02/18/2013  . Tobacco use disorder [Z72.0] 02/18/2013  . Hepatitis B infection [B16.9]   . Pre-ulcerative corn or callous [L84] 05/07/2012  . Urinary incontinence, nocturnal enuresis [N39.44] 05/07/2012  . Fecal incontinence [R15.9] 05/07/2012  . Systolic murmur [R01.1] 05/07/2012  . Routine health maintenance [Z00.00] 05/07/2012  . Asthma [493] 08/18/2010  . HIV (human immunodeficiency virus infection) [Z21] 08/18/2010  . Schizophrenia [F20.9] 08/18/2010  . Herpes simplex infection [B00.9] 08/18/2010      Plan Of Care/Follow-up recommendations:  Activity:  as tolerated. Diet:  Low sodium heart healthy. Other:  Keep follow-up appointments.  Is  patient on multiple antipsychotic therapies at discharge:  No   Has Patient had three or more failed trials of antipsychotic monotherapy by history:  No  Recommended Plan for Multiple Antipsychotic Therapies: NA    Catriona Dillenbeck 11/23/2014, 2:28 PM

## 2014-11-23 NOTE — [facility] Group Notes (Signed)
[facility] LCSW Group Therapy  11/23/2014 3:30 PM  Type of Therapy:  Group Therapy  Participation Level:  Minimal  Participation Quality:  Intrusive and Monopolizing  Affect:  Labile  Cognitive:  Disorganized  Insight:  Distracting, Lacking, Monopolizing and Off Topic  Engagement in Therapy:  Distracting and Monopolizing  Modes of Intervention:  Confrontation, Discussion and Limit-setting  Summary of Progress/Problems: Patient attended group but was not able to stay on topic and monopolized and facilitator redirected patient several times. Patient was redirectable but was distracted very easily but not able to participate in a conversation during group.   Lulu Riding, MSW, LCSWA 11/23/2014, 3:30 PM

## 2014-11-23 NOTE — Progress Notes (Signed)
Recreation Therapy Notes  Date: 08.15.16 Time: 3:00 pm Location: Craft Room   Group Topic: Self-expression    Goal Area(s) Addresses:  Patient will use art as a means of self-expression. Patient will verbalize at least one emotion they are feeling today.  Behavioral Response: Attentive  Intervention: Bottled Up  Activity: Patients were instructed to draw a bottle the way they saw themselves. Patients were instructed to write the emotions they were experiencing inside the bottle.  Education: LRT educated patients on different forms of self-expression.  Education Outcome: In group clarification offered  Clinical Observations/Feedback: Patient drew a bottle and some emotions. Patient wrote how different sayings about eyes. Patient left group at approximately 3:20 pm to use the bathroom. Patient return to group at approximately 3:29 pm. Patient started drawing faces on a different sheet and humming. LRT had to redirect patient a couple times. Patient compliant.  Jacquelynn Cree, LRT/CTRS 11/23/2014 4:56 PM

## 2014-11-23 NOTE — Progress Notes (Signed)
[facility] MD Progress Note  11/23/2014 3:06 PM YSABELLE GOODROE  MRN:  [id number]  Subjective:  Miss Pancoast is still somewhat paranoid and delusional. She believes that she works here and will receive a paycheck for months. Otherwise, she is doing well. She accepts medications and tolerates them well. Her symptoms have much improved and she is not overtly psychotic. She participates in groups. She interacts with peers and staff appropriately. Sleep and appetite are good. There are no somatic complaints. She received 2 injections of [name] already. The patient believed that her family is taking her up today about becoming comfortable visit. Discharge tomorrow.  Principal Problem: Paranoid schizophrenia Diagnosis:   Patient Active Problem List   Diagnosis Date Noted  . 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 Asthma, chronic [J45.909]   . HIV disease [B20]   . 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 Paranoid schizophrenia [F20.0]   . Psychosis [F29] 10/30/2014  . Psychoses [F29]   . Hyponatremia [E87.1] 10/22/2014  . Microscopic hematuria [R31.2] 01/29/2014  . Abdominal pain [R10.9] 12/12/2013  . Vulvar irritation [N90.89] 12/12/2013  . COPD (chronic obstructive pulmonary disease) [J44.9] 02/18/2013  . Tobacco use disorder [Z72.0] 02/18/2013  . Hepatitis B infection [B16.9]   . Pre-ulcerative corn or callous [L84] 05/07/2012  . Urinary incontinence, nocturnal enuresis [N39.44] 05/07/2012  . Fecal incontinence [R15.9] 05/07/2012  . Systolic murmur [R01.1] 05/07/2012  . Routine health maintenance [Z00.00] 05/07/2012  . Asthma [493] 08/18/2010  . HIV (human immunodeficiency virus infection) [Z21] 08/18/2010  . Schizophrenia [F20.9] 08/18/2010  . Herpes simplex infection [B00.9] 08/18/2010   Total Time spent with patient: 20 minutes   Past Medical History:  Past Medical History  Diagnosis Date  . GERD (gastroesophageal reflux disease)   . Schizophrenia   . AIDS  12-10-2007  . Herpes simplex   . History of cholecystectomy   . Cataract   . Nonspecific reaction to tuberculin skin test without active tuberculosis 11-2008 [facility]   . Seizures   . Asthmatic bronchitis , chronic   . Hepatitis B infection     followed by ID  . Chronic hyponatremia     Past Surgical History  Procedure Laterality Date  . Cholecystectomy    . Leg surgery      right leg, s/p accident   Family History:  Family History  Problem Relation Age of Onset  . Diabetes Sister   . Cancer Sister     breast  . Diabetes Brother   . Hypertension Brother   . Cancer Mother     lung  . Hypertension Mother   . Heart disease Father    Social History:  History  Alcohol Use No    Comment: Patient denies      History  Drug Use No    Comment: Patient denies    Social History   Social History  . Marital Status: Single    Spouse Name: N/A  . Number of Children: N/A  . Years of Education: N/A   Social History Main Topics  . Smoking status: Current Every Day Smoker -- 2.00 packs/day for 35 years    Types: Cigarettes    Start date: 04/10/1974  . Smokeless tobacco: Never Used     Comment: Previously smoked 2ppd, might start patch  . Alcohol Use: No     Comment: Patient denies   . Drug Use: No     Comment: Patient denies  . Sexual Activity: Not Currently  Comment: pt. declined condoms   Other Topics Concern  . None   Social History Narrative   The patient says she was born and raised in [place]. She says she currently [place] her brother. She says she has 1 son and is currently divorced. She was a very poor historian and unable to give any further social history.   Additional History:    Sleep: Good  Appetite:  Good   Assessment:   Musculoskeletal: Strength & Muscle Tone: within normal limits Gait & Station: normal Patient leans: N/A   Psychiatric Specialty Exam: Physical Exam  Nursing note and vitals reviewed.   Review of  Systems  All other systems reviewed and are negative.   Blood pressure 98/65, pulse 67, temperature 97.3 F (36.3 C), temperature source Oral, resp. rate 20, height 4\' 11"  (1.499 m), weight 63.504 kg (140 lb), SpO2 98 %.Body mass index is 28.26 kg/(m^2).  General Appearance: Casual  Eye Contact::  Good  Speech:  Clear and Coherent  Volume:  Normal  Mood:  Euthymic  Affect:  Appropriate  Thought Process:  Linear  Orientation:  Full (Time, Place, and Person)  Thought Content:  Delusions  Suicidal Thoughts:  No  Homicidal Thoughts:  No  Memory:  Immediate;   Fair Recent;   Fair Remote;   Fair  Judgement:  Fair  Insight:  Fair  Psychomotor Activity:  Normal  Concentration:  Fair  Recall:  [employer] of Knowledge:Fair  Language: Fair  Akathisia:  No  Handed:  Right  AIMS (if indicated):     Assets:  Communication Skills Desire for Improvement Financial Resources/Insurance Housing Physical Health Resilience Social Support  ADL's:  Intact  Cognition: WNL  Sleep:  Number of Hours: 6.75     Current Medications: Current Facility-Administered Medications  Medication Dose Route Frequency Provider Last Rate Last Dose  . acetaminophen (TYLENOL) tablet 650 mg  650 mg Oral Q6H PRN Shari Prows, MD   650 mg at 11/18/14 0924  . albuterol (PROVENTIL HFA;VENTOLIN HFA) 108 (90 BASE) MCG/ACT inhaler 2 puff  2 puff Inhalation Q4H Arn Mcomber B Atul Delucia, MD   2 puff at 11/23/14 1159  . calcium carbonate (TUMS - dosed in mg elemental calcium) chewable tablet 200 mg of elemental calcium  1 tablet Oral TID WC Jimmy Footman, MD   200 mg of elemental calcium at 11/23/14 1158  . Darunavir Ethanolate (PREZISTA) tablet 800 mg  800 mg Oral Q breakfast Nathifa Ritthaler B Ivy Puryear, MD   800 mg at 11/23/14 1025  . divalproex (DEPAKOTE SPRINKLE) capsule 500 mg  500 mg Oral QHS Dreyson Mishkin B Reagann Dolce, MD   500 mg at 11/22/14 2155  . emtricitabine-tenofovir (TRUVADA) 200-300 MG per tablet 1  tablet  1 tablet Oral Daily Shari Prows, MD   1 tablet at 11/23/14 1025  . LORazepam (ATIVAN) tablet 2 mg  2 mg Oral Q4H PRN Jimmy Footman, MD   2 mg at 11/22/14 1230  . magnesium hydroxide (MILK OF MAGNESIA) suspension 30 mL  30 mL Oral Daily PRN Shari Prows, MD      . Melene Muller ON 12/18/2014] paliperidone (INVEGA SUSTENNA) injection 234 mg  234 mg Intramuscular Once Xzavier Swinger B Jaelen Soth, MD      . pantoprazole (PROTONIX) EC tablet 40 mg  40 mg Oral Daily Ashtyn Meland B Danette Weinfeld, MD   40 mg at 11/23/14 1025  . raltegravir (ISENTRESS) tablet 400 mg  400 mg Oral BID Shari Prows, MD  400 mg at 11/23/14 1025  . risperiDONE (RISPERDAL M-TABS) disintegrating tablet 3 mg  3 mg Oral BID Shari Prows, MD   3 mg at 11/23/14 1025  . ritonavir (NORVIR) tablet 100 mg  100 mg Oral Q breakfast Deandrea Vanpelt B Farran Amsden, MD   100 mg at 11/23/14 1026  . temazepam (RESTORIL) capsule 15 mg  15 mg Oral QHS Shari Prows, MD   15 mg at 11/22/14 2155  . white petrolatum (VASELINE) gel   Topical PRN Shari Prows, MD        Lab Results: No results found for this or any previous visit (from the past 48 hour(s)).  Physical Findings: AIMS: Facial and Oral Movements Muscles of Facial Expression: Minimal Lips and Perioral Area: Minimal Jaw: Minimal Tongue: Minimal,Extremity Movements Upper (arms, wrists, hands, fingers): None, normal Lower (legs, knees, ankles, toes): None, normal, Trunk Movements Neck, shoulders, hips: None, normal, Overall Severity Severity of abnormal movements (highest score from questions above): None, normal Incapacitation due to abnormal movements: None, normal Patient's awareness of abnormal movements (rate only patient's report): No Awareness, Dental Status Current problems with teeth and/or dentures?: No Does patient usually wear dentures?: Yes  CIWA:    COWS:     Treatment Plan Summary: Daily contact with patient to assess and evaluate  symptoms and progress in treatment and Medication management   Medical Decision Making:  Established Problem, Stable/Improving (1), Review of Psycho-Social Stressors (1), Review or order clinical lab tests (1), Review of Medication Regimen & Side Effects (2) and Review of New Medication or Change in Dosage (2)   Miss Bansal is a 54 year old female with history of schizoaffective disorder admitted for psychotic break.  1) Mood and psychosis. The patient was continued on Risperdal 3 mg twice daily for psychosis.She was given two initial [name] injections. Next dose of 234 mg is to be given on 12/18/2014. She was also startedon Depakote 500 mg daily. (VPA 54). The patient could not tolerate higher dose of Depakote due to elevated ammonia.She has continued on Neurontin.  2) HIV: She does follow with an ID physician. Last CD4 count was 420 in April 2016. We'll continue all antiretroviral's including Truvada 1 tablet by mouth daily and Ritonavir 100 mg by mouth daily.   3). COPD. She is on inhalers. No recent exacerbation of COPD  4) Constipation. MiraLAX.  5) GERD. We will offer pantoprazole.  6) Insomnia: She wil continue on Ambien at home.   7) UA is not suggestive of UTI.   8) PT. The patient has been using walker in the hospital. We will ask PT for evaluation.  9) Disposition. She will be discharged to home with family. She will follow up with [facility] ACT team.     Melanye Hiraldo 11/23/2014, 3:06 PM

## 2014-11-24 NOTE — Progress Notes (Signed)
Pt discharged home. DC instructions provided and explained. Medications reviewed. Rx given. All questions answered. Pt stable at discharge. Denies SI, HI, AVH. 

## 2014-11-24 NOTE — Progress Notes (Signed)
Pt remains with 1:1 sitter for safety. Using her walker. Visible in the milieu. Pleasant cooperative. Med compliant. Voices no complaints.

## 2014-11-24 NOTE — Progress Notes (Signed)
  [facility] Adult Case Management Discharge Plan :  Will you be returning to the same living situation after discharge:  Yes,    At discharge, do you have transportation home?: Yes,   brother to pick up Do you have the ability to pay for your medications: Yes,  Medicare and Medicaid  Release of information consent forms completed and in the chart;  Patient's signature needed at discharge.  Patient to Follow up at: Follow-up Information    Follow up with [facility] On 11/25/2014.   Why:  ACT team will check in with you tomorrow.   Contact information:   [address] [place] [address] Phone: [phone] Fax:[phone]      Patient denies SI/HI: Yes,       Safety Planning and Suicide Prevention discussed: Yes,     Have you used any form of tobacco in the last 30 days? (Cigarettes, Smokeless Tobacco, Cigars, and/or Pipes): Yes  Has patient been referred to the Quitline?: Patient refused referral  Glennon Mac, MSW, LCSW 11/24/2014, 11:01 AM

## 2014-11-24 NOTE — [facility] Suicide Risk Assessment (Signed)
[facility] INPATIENT:  Family/Significant Other Suicide Prevention Education  Suicide Prevention Education:  Education Completed; Eligah East, Sister,  (name of family member/significant other) has been identified by the patient as the family member/significant other with whom the patient will be residing, and identified as the person(s) who will aid the patient in the event of a mental health crisis (suicidal ideations/suicide attempt).  With written consent from the patient, the family member/significant other has been provided the following suicide prevention education, prior to the and/or following the discharge of the patient.  The suicide prevention education provided includes the following:  Suicide risk factors  Suicide prevention and interventions  National Suicide Hotline telephone number  [facility] assessment telephone number  [facility] Emergency Assistance 911  [facility] and/or Residential Mobile Crisis Unit telephone number  Request made of family/significant other to:  Remove weapons (e.g., guns, rifles, knives), all items previously/currently identified as safety concern.    Remove drugs/medications (over-the-counter, prescriptions, illicit drugs), all items previously/currently identified as a safety concern.  The family member/significant other verbalizes understanding of the suicide prevention education information provided.  The family member/significant other agrees to remove the items of safety concern listed above.  Glennon Mac 11/24/2014, 11:00 AM, MSW, LCSW

## 2014-11-24 NOTE — Progress Notes (Signed)
AVS H&P Discharge Summary faxed to [facility]) for hospital follow-up

## 2014-11-24 NOTE — [facility] Suicide Risk Assessment (Signed)
[facility] Discharge Suicide Risk Assessment   Demographic Factors:  NA  Total Time spent with patient: 30 minutes  Musculoskeletal: Strength & Muscle Tone: within normal limits Gait & Station: normal Patient leans: N/A  Psychiatric Specialty Exam: Physical Exam  Nursing note and vitals reviewed.   Review of Systems  All other systems reviewed and are negative.   Blood pressure 98/65, pulse 67, temperature 97.3 F (36.3 C), temperature source Oral, resp. rate 20, height 4\' 11"  (1.499 m), weight 63.504 kg (140 lb), SpO2 98 %.Body mass index is 28.26 kg/(m^2).  General Appearance: Casual  Eye Contact::  Good  Speech:  Normal Rate409  Volume:  Normal  Mood:  Euthymic  Affect:  Appropriate  Thought Process:  Linear  Orientation:  Full (Time, Place, and Person)  Thought Content:  Delusions  Suicidal Thoughts:  No  Homicidal Thoughts:  No  Memory:  Immediate;   Fair Recent;   Fair Remote;   Fair  Judgement:  Fair  Insight:  Fair  Psychomotor Activity:  Normal  Concentration:  Fair  Recall:  [employer] of Knowledge:Fair  Language: Fair  Akathisia:  No  Handed:  Right  AIMS (if indicated):     Assets:  Communication Skills Desire for Improvement Financial Resources/Insurance Housing Resilience Social Support  Sleep:  Number of Hours: 6  Cognition: WNL  ADL's:  Intact   Have you used any form of tobacco in the last 30 days? (Cigarettes, Smokeless Tobacco, Cigars, and/or Pipes): Yes  Has this patient used any form of tobacco in the last 30 days? (Cigarettes, Smokeless Tobacco, Cigars, and/or Pipes) Yes, A prescription for an FDA-approved tobacco cessation medication was offered at discharge and the patient refused  Mental Status Per Nursing Assessment::   On Admission:  NA  Current Mental Status by Physician: NA  Loss Factors: NA  Historical Factors: Impulsivity  Risk Reduction Factors:   Sense of responsibility to family  Continued Clinical Symptoms:   Schizophrenia:   Paranoid or undifferentiated type  Cognitive Features That Contribute To Risk:  None    Suicide Risk:  Minimal: No identifiable suicidal ideation.  Patients presenting with no risk factors but with morbid ruminations; may be classified as minimal risk based on the severity of the depressive symptoms  Principal Problem: Paranoid schizophrenia Discharge Diagnoses:  Patient Active Problem List   Diagnosis Date Noted  . 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 Asthma, chronic [J45.909]   . HIV disease [B20]   . 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 Paranoid schizophrenia [F20.0]   . Psychosis [F29] 10/30/2014  . Psychoses [F29]   . Hyponatremia [E87.1] 10/22/2014  . Microscopic hematuria [R31.2] 01/29/2014  . Abdominal pain [R10.9] 12/12/2013  . Vulvar irritation [N90.89] 12/12/2013  . COPD (chronic obstructive pulmonary disease) [J44.9] 02/18/2013  . Tobacco use disorder [Z72.0] 02/18/2013  . Hepatitis B infection [B16.9]   . Pre-ulcerative corn or callous [L84] 05/07/2012  . Urinary incontinence, nocturnal enuresis [N39.44] 05/07/2012  . Fecal incontinence [R15.9] 05/07/2012  . Systolic murmur [R01.1] 05/07/2012  . Routine health maintenance [Z00.00] 05/07/2012  . Asthma [493] 08/18/2010  . HIV (human immunodeficiency virus infection) [Z21] 08/18/2010  . Schizophrenia [F20.9] 08/18/2010  . Herpes simplex infection [B00.9] 08/18/2010      Plan Of Care/Follow-up recommendations:  Activity:  As tolerated. Diet:  Low sodium heart healthy. Other:  Keep follow-up appointments.  Is patient on multiple antipsychotic therapies at discharge:  Yes,   Do you recommend  tapering to monotherapy for antipsychotics?  Yes   Has Patient had three or more failed trials of antipsychotic monotherapy by history:  Yes,   Antipsychotic medications that previously failed include:   1.  haldol., 2.  risperdal. and 3.  Zyprexa.  Recommended Plan for Multiple  Antipsychotic Therapies: Taper to monotherapy as described:  To discontinue Risperdal and continue [name].    Sherin Murdoch 11/24/2014, 9:18 AM

## 2014-11-27 ENCOUNTER — Emergency Department (HOSPITAL_COMMUNITY): Payer: Medicaid Other

## 2014-11-27 ENCOUNTER — Inpatient Hospital Stay (HOSPITAL_COMMUNITY): Payer: Medicaid Other

## 2014-11-27 ENCOUNTER — Inpatient Hospital Stay (HOSPITAL_COMMUNITY)
Admission: EM | Admit: 2014-11-27 | Discharge: 2014-11-30 | DRG: 190 | Disposition: A | Discharge status: Other Acute Inpatient Hospital | Payer: Medicaid Other | Attending: Family Medicine | Admitting: Family Medicine

## 2014-11-27 ENCOUNTER — Encounter (HOSPITAL_COMMUNITY): Payer: Self-pay | Admitting: Emergency Medicine

## 2014-11-27 DIAGNOSIS — F17200 Nicotine dependence, unspecified, uncomplicated: Secondary | ICD-10-CM | POA: Diagnosis present

## 2014-11-27 DIAGNOSIS — Z7951 Long term (current) use of inhaled steroids: Secondary | ICD-10-CM | POA: Diagnosis not present

## 2014-11-27 DIAGNOSIS — J449 Chronic obstructive pulmonary disease, unspecified: Secondary | ICD-10-CM | POA: Insufficient documentation

## 2014-11-27 DIAGNOSIS — R0902 Hypoxemia: Secondary | ICD-10-CM | POA: Diagnosis not present

## 2014-11-27 DIAGNOSIS — Z7952 Long term (current) use of systemic steroids: Secondary | ICD-10-CM | POA: Diagnosis not present

## 2014-11-27 DIAGNOSIS — R4182 Altered mental status, unspecified: Secondary | ICD-10-CM | POA: Diagnosis not present

## 2014-11-27 DIAGNOSIS — G9340 Encephalopathy, unspecified: Secondary | ICD-10-CM | POA: Diagnosis present

## 2014-11-27 DIAGNOSIS — R45850 Homicidal ideations: Secondary | ICD-10-CM | POA: Diagnosis present

## 2014-11-27 DIAGNOSIS — R569 Unspecified convulsions: Secondary | ICD-10-CM | POA: Diagnosis present

## 2014-11-27 DIAGNOSIS — R011 Cardiac murmur, unspecified: Secondary | ICD-10-CM | POA: Diagnosis present

## 2014-11-27 DIAGNOSIS — R5383 Other fatigue: Secondary | ICD-10-CM | POA: Insufficient documentation

## 2014-11-27 DIAGNOSIS — J441 Chronic obstructive pulmonary disease with (acute) exacerbation: Principal | ICD-10-CM | POA: Diagnosis present

## 2014-11-27 DIAGNOSIS — B1910 Unspecified viral hepatitis B without hepatic coma: Secondary | ICD-10-CM | POA: Diagnosis present

## 2014-11-27 DIAGNOSIS — E871 Hypo-osmolality and hyponatremia: Secondary | ICD-10-CM | POA: Diagnosis present

## 2014-11-27 DIAGNOSIS — Z79891 Long term (current) use of opiate analgesic: Secondary | ICD-10-CM | POA: Diagnosis not present

## 2014-11-27 DIAGNOSIS — F200 Paranoid schizophrenia: Secondary | ICD-10-CM | POA: Diagnosis present

## 2014-11-27 DIAGNOSIS — K219 Gastro-esophageal reflux disease without esophagitis: Secondary | ICD-10-CM | POA: Diagnosis present

## 2014-11-27 DIAGNOSIS — J410 Simple chronic bronchitis: Secondary | ICD-10-CM | POA: Diagnosis not present

## 2014-11-27 DIAGNOSIS — J45901 Unspecified asthma with (acute) exacerbation: Secondary | ICD-10-CM | POA: Diagnosis present

## 2014-11-27 DIAGNOSIS — B20 Human immunodeficiency virus [HIV] disease: Secondary | ICD-10-CM | POA: Diagnosis present

## 2014-11-27 DIAGNOSIS — Z79899 Other long term (current) drug therapy: Secondary | ICD-10-CM | POA: Diagnosis not present

## 2014-11-27 DIAGNOSIS — F259 Schizoaffective disorder, unspecified: Secondary | ICD-10-CM | POA: Diagnosis not present

## 2014-11-27 DIAGNOSIS — R062 Wheezing: Secondary | ICD-10-CM | POA: Diagnosis not present

## 2014-11-27 HISTORY — DX: Chronic obstructive pulmonary disease, unspecified: J44.9

## 2014-11-27 HISTORY — DX: Tobacco use: Z72.0

## 2014-11-27 HISTORY — DX: Other specified chronic obstructive pulmonary disease: J44.89

## 2014-11-27 LAB — COMPREHENSIVE METABOLIC PANEL
ALT: 29 U/L (ref 14–54)
ANION GAP: 8 (ref 5–15)
AST: 32 U/L (ref 15–41)
Albumin: 3.9 g/dL (ref 3.5–5.0)
Alkaline Phosphatase: 96 U/L (ref 38–126)
BILIRUBIN TOTAL: 0.5 mg/dL (ref 0.3–1.2)
BUN: 19 mg/dL (ref 6–20)
CHLORIDE: 89 mmol/L — AB (ref 101–111)
CO2: 25 mmol/L (ref 22–32)
Calcium: 9 mg/dL (ref 8.9–10.3)
Creatinine, Ser: 0.86 mg/dL (ref 0.44–1.00)
Glucose, Bld: 115 mg/dL — ABNORMAL HIGH (ref 65–99)
POTASSIUM: 3.9 mmol/L (ref 3.5–5.1)
Sodium: 122 mmol/L — ABNORMAL LOW (ref 135–145)
TOTAL PROTEIN: 8 g/dL (ref 6.5–8.1)

## 2014-11-27 LAB — BASIC METABOLIC PANEL
ANION GAP: 10 (ref 5–15)
BUN: 15 mg/dL (ref 6–20)
CALCIUM: 8.5 mg/dL — AB (ref 8.9–10.3)
CO2: 24 mmol/L (ref 22–32)
Chloride: 95 mmol/L — ABNORMAL LOW (ref 101–111)
Creatinine, Ser: 0.86 mg/dL (ref 0.44–1.00)
Glucose, Bld: 94 mg/dL (ref 65–99)
Potassium: 3.8 mmol/L (ref 3.5–5.1)
Sodium: 129 mmol/L — ABNORMAL LOW (ref 135–145)

## 2014-11-27 LAB — CBC
HCT: 35.8 % — ABNORMAL LOW (ref 36.0–46.0)
Hemoglobin: 12.5 g/dL (ref 12.0–15.0)
MCH: 30.5 pg (ref 26.0–34.0)
MCHC: 34.9 g/dL (ref 30.0–36.0)
MCV: 87.3 fL (ref 78.0–100.0)
PLATELETS: 237 10*3/uL (ref 150–400)
RBC: 4.1 MIL/uL (ref 3.87–5.11)
RDW: 13.6 % (ref 11.5–15.5)
WBC: 8.7 10*3/uL (ref 4.0–10.5)

## 2014-11-27 LAB — TROPONIN I

## 2014-11-27 LAB — BLOOD GAS, ARTERIAL
ACID-BASE EXCESS: 1.4 mmol/L (ref 0.0–2.0)
Bicarbonate: 26.1 mEq/L — ABNORMAL HIGH (ref 20.0–24.0)
DRAWN BY: 103701
O2 CONTENT: 3 L/min
O2 SAT: 90.7 %
PATIENT TEMPERATURE: 98.6
PCO2 ART: 43.9 mmHg (ref 35.0–45.0)
TCO2: 23.7 mmol/L (ref 0–100)
pH, Arterial: 7.392 (ref 7.350–7.450)
pO2, Arterial: 64.5 mmHg — ABNORMAL LOW (ref 80.0–100.0)

## 2014-11-27 LAB — ETHANOL

## 2014-11-27 LAB — RAPID URINE DRUG SCREEN, HOSP PERFORMED
Amphetamines: NOT DETECTED
BENZODIAZEPINES: NOT DETECTED
Barbiturates: NOT DETECTED
Cocaine: NOT DETECTED
OPIATES: NOT DETECTED
Tetrahydrocannabinol: NOT DETECTED

## 2014-11-27 LAB — BRAIN NATRIURETIC PEPTIDE: B NATRIURETIC PEPTIDE 5: 30.1 pg/mL (ref 0.0–100.0)

## 2014-11-27 LAB — ACETAMINOPHEN LEVEL: Acetaminophen (Tylenol), Serum: <10 ug/mL — ABNORMAL LOW (ref 10–30)

## 2014-11-27 LAB — SALICYLATE LEVEL

## 2014-11-27 LAB — MRSA PCR SCREENING: MRSA BY PCR: NEGATIVE

## 2014-11-27 MED ORDER — IPRATROPIUM-ALBUTEROL 0.5-2.5 (3) MG/3ML IN SOLN
3.0000 mL | Freq: Once | RESPIRATORY_TRACT | Status: AC
  Administered 2014-11-27: 3 mL via RESPIRATORY_TRACT
  Filled 2014-11-27: qty 3

## 2014-11-27 MED ORDER — HYDROXYZINE HCL 25 MG PO TABS: 25.0000 mg | ORAL_TABLET | Freq: Two times a day (BID) | ORAL | Status: DC | PRN

## 2014-11-27 MED ORDER — EMTRICITABINE-TENOFOVIR DF 200-300 MG PO TABS
1.0000 | ORAL_TABLET | Freq: Every day | ORAL | Status: DC
  Administered 2014-11-27 – 2014-11-30 (×4): 1 via ORAL
  Filled 2014-11-27 (×4): qty 1

## 2014-11-27 MED ORDER — RALTEGRAVIR POTASSIUM 400 MG PO TABS
400.0000 mg | ORAL_TABLET | Freq: Two times a day (BID) | ORAL | Status: DC
  Administered 2014-11-27 – 2014-11-30 (×6): 400 mg via ORAL
  Filled 2014-11-27 (×10): qty 1

## 2014-11-27 MED ORDER — SODIUM CHLORIDE 0.9 % IV SOLN
Freq: Once | INTRAVENOUS | Status: AC
  Administered 2014-11-27: 08:00:00 via INTRAVENOUS

## 2014-11-27 MED ORDER — SODIUM CHLORIDE 0.9 % IV SOLN: INTRAVENOUS | Status: DC

## 2014-11-27 MED ORDER — DARUNAVIR ETHANOLATE 800 MG PO TABS
800.0000 mg | ORAL_TABLET | Freq: Every day | ORAL | Status: DC
  Administered 2014-11-27 – 2014-11-30 (×4): 800 mg via ORAL
  Filled 2014-11-27 (×5): qty 1

## 2014-11-27 MED ORDER — RISPERIDONE 2 MG PO TBDP
3.0000 mg | ORAL_TABLET | Freq: Two times a day (BID) | ORAL | Status: DC
  Administered 2014-11-27: 3 mg via ORAL
  Filled 2014-11-27 (×3): qty 1

## 2014-11-27 MED ORDER — IOHEXOL 350 MG/ML SOLN
80.0000 mL | Freq: Once | INTRAVENOUS | Status: AC | PRN
  Administered 2014-11-27: 80 mL via INTRAVENOUS

## 2014-11-27 MED ORDER — NICOTINE 14 MG/24HR TD PT24: 14.0000 mg | MEDICATED_PATCH | Freq: Every day | TRANSDERMAL | Status: DC

## 2014-11-27 MED ORDER — ALBUTEROL SULFATE HFA 108 (90 BASE) MCG/ACT IN AERS: 2.0000 | INHALATION_SPRAY | Freq: Four times a day (QID) | RESPIRATORY_TRACT | Status: DC | PRN

## 2014-11-27 MED ORDER — BECLOMETHASONE DIPROPIONATE 80 MCG/ACT IN AERS: 1.0000 | INHALATION_SPRAY | Freq: Two times a day (BID) | RESPIRATORY_TRACT | Status: DC

## 2014-11-27 MED ORDER — NICOTINE 14 MG/24HR TD PT24
14.0000 mg | MEDICATED_PATCH | Freq: Every day | TRANSDERMAL | Status: DC
  Administered 2014-11-27: 14 mg via TRANSDERMAL
  Filled 2014-11-27: qty 1

## 2014-11-27 MED ORDER — BENZTROPINE MESYLATE 1 MG PO TABS
0.5000 mg | ORAL_TABLET | Freq: Two times a day (BID) | ORAL | Status: DC
  Administered 2014-11-27: 0.5 mg via ORAL
  Filled 2014-11-27: qty 1

## 2014-11-27 MED ORDER — IPRATROPIUM-ALBUTEROL 0.5-2.5 (3) MG/3ML IN SOLN
3.0000 mL | Freq: Three times a day (TID) | RESPIRATORY_TRACT | Status: DC
  Administered 2014-11-28: 3 mL via RESPIRATORY_TRACT
  Filled 2014-11-27: qty 3

## 2014-11-27 MED ORDER — ALBUTEROL SULFATE (2.5 MG/3ML) 0.083% IN NEBU
2.5000 mg | INHALATION_SOLUTION | Freq: Four times a day (QID) | RESPIRATORY_TRACT | Status: DC | PRN
  Administered 2014-11-29 (×2): 2.5 mg via RESPIRATORY_TRACT
  Filled 2014-11-27 (×2): qty 3

## 2014-11-27 MED ORDER — SODIUM CHLORIDE 0.9 % IV SOLN
INTRAVENOUS | Status: DC
  Administered 2014-11-27: 14:00:00 via INTRAVENOUS

## 2014-11-27 MED ORDER — FAMOTIDINE 20 MG PO TABS
20.0000 mg | ORAL_TABLET | Freq: Every day | ORAL | Status: DC
  Administered 2014-11-27 – 2014-11-30 (×4): 20 mg via ORAL
  Filled 2014-11-27 (×4): qty 1

## 2014-11-27 MED ORDER — ALBUTEROL SULFATE (2.5 MG/3ML) 0.083% IN NEBU: 2.5000 mg | INHALATION_SOLUTION | RESPIRATORY_TRACT | Status: DC | PRN

## 2014-11-27 MED ORDER — SODIUM CHLORIDE 0.9 % IV BOLUS (SEPSIS): 1000.0000 mL | Freq: Once | INTRAVENOUS | Status: DC

## 2014-11-27 MED ORDER — ALBUTEROL SULFATE (2.5 MG/3ML) 0.083% IN NEBU: 2.5000 mg | INHALATION_SOLUTION | Freq: Four times a day (QID) | RESPIRATORY_TRACT | Status: DC | PRN

## 2014-11-27 MED ORDER — RITONAVIR 100 MG PO TABS
100.0000 mg | ORAL_TABLET | Freq: Every day | ORAL | Status: DC
  Administered 2014-11-27 – 2014-11-30 (×4): 100 mg via ORAL
  Filled 2014-11-27 (×5): qty 1

## 2014-11-27 MED ORDER — PREDNISONE 50 MG PO TABS
60.0000 mg | ORAL_TABLET | Freq: Every day | ORAL | Status: DC
  Administered 2014-11-28: 60 mg via ORAL
  Filled 2014-11-27 (×2): qty 1

## 2014-11-27 MED ORDER — DIVALPROEX SODIUM 125 MG PO CSDR
500.0000 mg | DELAYED_RELEASE_CAPSULE | Freq: Every day | ORAL | Status: DC
  Administered 2014-11-27 – 2014-11-29 (×3): 500 mg via ORAL
  Filled 2014-11-27 (×4): qty 4

## 2014-11-27 MED ORDER — ALBUTEROL SULFATE (2.5 MG/3ML) 0.083% IN NEBU
5.0000 mg | INHALATION_SOLUTION | Freq: Once | RESPIRATORY_TRACT | Status: AC
  Administered 2014-11-27: 5 mg via RESPIRATORY_TRACT
  Filled 2014-11-27: qty 6

## 2014-11-27 MED ORDER — NICOTINE 21 MG/24HR TD PT24
21.0000 mg | MEDICATED_PATCH | Freq: Every day | TRANSDERMAL | Status: DC
  Administered 2014-11-28 – 2014-11-30 (×3): 21 mg via TRANSDERMAL
  Filled 2014-11-27 (×3): qty 1

## 2014-11-27 MED ORDER — NALOXONE HCL 0.4 MG/ML IJ SOLN: 0.4000 mg | INTRAMUSCULAR | Status: DC | PRN

## 2014-11-27 MED ORDER — IPRATROPIUM-ALBUTEROL 0.5-2.5 (3) MG/3ML IN SOLN
3.0000 mL | RESPIRATORY_TRACT | Status: DC
  Administered 2014-11-27 (×2): 3 mL via RESPIRATORY_TRACT
  Filled 2014-11-27 (×2): qty 3

## 2014-11-27 MED ORDER — BUDESONIDE 0.25 MG/2ML IN SUSP
0.2500 mg | Freq: Two times a day (BID) | RESPIRATORY_TRACT | Status: DC
  Administered 2014-11-27 – 2014-11-30 (×7): 0.25 mg via RESPIRATORY_TRACT
  Filled 2014-11-27 (×8): qty 2

## 2014-11-27 MED ORDER — DOXYCYCLINE HYCLATE 100 MG PO TABS
100.0000 mg | ORAL_TABLET | Freq: Two times a day (BID) | ORAL | Status: DC
  Administered 2014-11-27 – 2014-11-28 (×3): 100 mg via ORAL
  Filled 2014-11-27 (×3): qty 1

## 2014-11-27 MED ORDER — ZOLPIDEM TARTRATE 5 MG PO TABS: 5.0000 mg | ORAL_TABLET | Freq: Every evening | ORAL | Status: DC | PRN

## 2014-11-27 MED ORDER — HEPARIN SODIUM (PORCINE) 5000 UNIT/ML IJ SOLN
5000.0000 [IU] | Freq: Three times a day (TID) | INTRAMUSCULAR | Status: DC
  Administered 2014-11-27 – 2014-11-30 (×8): 5000 [IU] via SUBCUTANEOUS
  Filled 2014-11-27 (×9): qty 1

## 2014-11-27 MED ORDER — SODIUM CHLORIDE 0.9 % IV BOLUS (SEPSIS)
1000.0000 mL | Freq: Once | INTRAVENOUS | Status: AC
  Administered 2014-11-27: 1000 mL via INTRAVENOUS

## 2014-11-27 MED ORDER — METHYLPREDNISOLONE SODIUM SUCC 125 MG IJ SOLR
125.0000 mg | Freq: Once | INTRAMUSCULAR | Status: AC
  Administered 2014-11-27: 125 mg via INTRAVENOUS
  Filled 2014-11-27: qty 2

## 2014-11-27 MED ORDER — GABAPENTIN 400 MG PO CAPS
400.0000 mg | ORAL_CAPSULE | Freq: Two times a day (BID) | ORAL | Status: DC
  Administered 2014-11-27: 400 mg via ORAL
  Filled 2014-11-27: qty 1

## 2014-11-27 NOTE — Progress Notes (Signed)
Report received from Taquita, RN for admission to [phone] at [facility]

## 2014-11-27 NOTE — Progress Notes (Signed)
Call Pager [phone] for any questions or notifications regarding this patient  FMTS Attending Admission Brief  Note: Denny Levy MD Attending pager:[phone]  Very somnolent. Notable old traumatic defect right lower extremity. Also, bilateral ankles have circular reddish marks, not contiguous, not petechial. Will try narcan. Have ordered serum drug screen. At this time protecting airway, I will be at bedside for narcan administration.
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
## 2014-11-27 NOTE — ED Provider Notes (Signed)
Pt signed out to me at shift change. Pt here for homicidal ideations, IVCed by [employer] for disorganized thoughts, agitation, threatening to kill her brother. Pt's sodium found to be 122. Per prior provider, hydrate, recheck Na  7:38 AM Pt very somnolent, arouses to painful stimuli and loud commands. Slurring speech. Drooling. Pt is able to tell me she is at the hospital and her name, unable to recall what year it is or when her birthday is. Pt also became hypoxic, dropping oxygen sat into 80s. Diffuse wheezing on exam. Will get chest xray. Duo neb ordered. Will get a blood gas given lethargy. Pt will need inpatient admission.   7:52 AM  Took off oxygen, pt dropped to 83% on RA. Good waveform. Placed back on 3L.  BNP and troponin negative. Spoke with family practice they will admit.  Filed Vitals:   11/27/14 1202 11/27/14 1250 11/27/14 1504 11/27/14 1506  BP: 100/81 94/53 99/64    Pulse: 74 68 59   Temp:  97.8 F (36.6 C)    TempSrc:  Oral    Resp: 18     Height:   (1.575 m)    Weight:  150 lb 11.2 oz (68.357 kg)    SpO2: 91% 100% 97% 97%     Jaynie Crumble, PA-C 11/27/14 1645  Samuel Jester, DO 11/29/14 1427
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
## 2014-11-27 NOTE — ED Notes (Signed)
Report given to [id number] - Fleet Contras

## 2014-11-27 NOTE — Progress Notes (Addendum)
Patient has decreased mental status with increase in lethargy. Pt was not responding to sternal rub. Pinpoint pupils. MD at bedside who ordered narcan. VSS. Rapid response notified. Safety sitter at bedside stated patient had been "heavily sleeping for the past hour".   1505: narcan at bedside and patient now alert and talking with MD. Narcan order cancelled. Patient stated "I was sleeping hard" and expressing wants for food. MD ordered diet for patient. Patient stated that she has not been taking drugs.   1900: patient much more alert. Finished full dinner tray and asking for more snacks. Patient conversing with sitter at bedside and recalled sister's phone number to call and talk with her.
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
## 2014-11-27 NOTE — ED Notes (Signed)
Pt has one belongings bag with pj's and a cup (inside the bag) with her rosary, watch, 2 sets of earrings.

## 2014-11-27 NOTE — ED Notes (Signed)
Respiratory called

## 2014-11-27 NOTE — ED Notes (Signed)
Pt arrived to ED with [facility].  Pt comes from [place] and has been IVC'd .  According to paperwork from [facility] pt is having severely disorganized thoughts, is agitated, and threatening to kill her brother.  Pt has a history of schizophrenia.  Pt requires ambulatory assistance and was given a walker yesterday at [facility].  Pt is incontinent of urine.  Pt has a history of falls.  Pt states she smoked two packs of menthol cigarettes back to back today.  Pt denies pain.  Pt is extremely lethargic.  Pt states she took her medications last night.

## 2014-11-27 NOTE — Progress Notes (Signed)
MD made aware of patient's arrival to unit

## 2014-11-27 NOTE — ED Notes (Signed)
Pt resting with eyes closed and will not answer questions when asked; pt under IVC

## 2014-11-27 NOTE — ED Notes (Signed)
Pt has been served IVC paperwork by [employer] department
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
## 2014-11-27 NOTE — Progress Notes (Signed)
ANTICOAGULATION CONSULT NOTE - Initial Consult  Pharmacy Consult for heparin Indication: VTE prophylaxis  No Known Allergies  Patient Measurements: Height:  (157.5 cm) Weight: 150 lb 11.2 oz (68.357 kg) IBW/kg (Calculated) : 50.1  Vital Signs: Temp: 97.8 F (36.6 C) (08/19 1250) Temp Source: Oral (08/19 1250) BP: 99/64 mmHg (08/19 1504) Pulse Rate: 59 (08/19 1504)  Labs:  Recent Labs  11/27/14 0217 11/27/14 0807 11/27/14 0834  HGB 12.5  --   --   HCT 35.8*  --   --   PLT 237  --   --   CREATININE 0.86 0.86  --   TROPONINI  --   --  <0.03    Estimated Creatinine Clearance: 67 mL/min (by C-G formula based on Cr of 0.86).   Medical History: Past Medical History  Diagnosis Date  . GERD (gastroesophageal reflux disease)   . Schizophrenia   . AIDS 12-10-2007  . Herpes simplex   . History of cholecystectomy   . Cataract   . Nonspecific reaction to tuberculin skin test without active tuberculosis 11-2008 [facility]   . Seizures   . Asthmatic bronchitis , chronic   . Hepatitis B infection     followed by ID  . Chronic asthmatic bronchitis   . Tobacco use    Assessment: 54 yof with CC of AMS, hypoxia. Pharmacy consulted to dose heparin for VTE ppx. No anticoag pta, no other issues.  Heparin 5000 units [place] q8h ordered. Pharmacy will sign off consult as this medication is not renally adjusted.   Goal of Therapy:  Prevention of clot Monitor platelets by anticoagulation protocol: Yes   Plan:  Heparin 5000 units [place] q8h  Babs Bertin, PharmD Clinical Pharmacist Pager [phone] 11/27/2014 3:12 PM   Babs Bertin P 11/27/2014,3:10 PM

## 2014-11-27 NOTE — ED Notes (Signed)
Pt vomited x 1; was attempting to start IV and pt states that the sight of blood made her sick; pt denies nausea after 1 episode of vomiting

## 2014-11-27 NOTE — ED Notes (Signed)
Pt O2 sat 84% on room air; pt placed on O2 via NV a 3lpm

## 2014-11-28 ENCOUNTER — Encounter (HOSPITAL_COMMUNITY): Payer: Self-pay | Admitting: *Deleted

## 2014-11-28 DIAGNOSIS — E871 Hypo-osmolality and hyponatremia: Secondary | ICD-10-CM

## 2014-11-28 LAB — BASIC METABOLIC PANEL
ANION GAP: 9 (ref 5–15)
BUN: 17 mg/dL (ref 6–20)
CALCIUM: 9 mg/dL (ref 8.9–10.3)
CO2: 23 mmol/L (ref 22–32)
CREATININE: 0.88 mg/dL (ref 0.44–1.00)
Chloride: 101 mmol/L (ref 101–111)
GLUCOSE: 134 mg/dL — AB (ref 65–99)
Potassium: 3.8 mmol/L (ref 3.5–5.1)
Sodium: 133 mmol/L — ABNORMAL LOW (ref 135–145)

## 2014-11-28 MED ORDER — DEXAMETHASONE 1 MG/ML PO CONC
10.0000 mg | Freq: Once | ORAL | Status: AC
  Administered 2014-11-28: 10 mg via ORAL
  Filled 2014-11-28: qty 10

## 2014-11-28 MED ORDER — RISPERIDONE 2 MG PO TBDP
3.0000 mg | ORAL_TABLET | Freq: Two times a day (BID) | ORAL | Status: DC
  Administered 2014-11-28 – 2014-11-30 (×5): 3 mg via ORAL
  Filled 2014-11-28 (×7): qty 1

## 2014-11-28 MED ORDER — IPRATROPIUM-ALBUTEROL 0.5-2.5 (3) MG/3ML IN SOLN: 3.0000 mL | Freq: Four times a day (QID) | RESPIRATORY_TRACT | Status: DC | PRN

## 2014-11-28 NOTE — Progress Notes (Signed)
Utilization review completed.  

## 2014-11-28 NOTE — Care Management Note (Addendum)
Case Management Note  Patient Details  Name: [name] MRN: [id number] Date of Birth: 10/18/1960  Subjective/Objective:                   Altered Mental Status, Hypoxia Action/Plan: Discharge planning  Expected Discharge Date:  12/01/14               Expected Discharge Plan: TBD  In-House Referral:     Discharge planning Services  CM Consult  Post Acute Care Choice:    Choice offered to:     DME Arranged:    DME Agency:     HH Arranged:    HH Agency:     Status of Service:  Completed, signed off  Medicare Important Message Given:    Date Medicare IM Given:    Medicare IM give by:    Date Additional Medicare IM Given:    Additional Medicare Important Message give by:     If discussed at Long Length of Stay Meetings, dates discussed:    Additional Comments: CM notes pt requires CSW consult; psych.  CSW following. No other CM needs were communicated. Yves Dill, RN 11/28/2014, 3:01 PM
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
## 2014-11-28 NOTE — Progress Notes (Signed)
Disposition CSW completed patient referrals to the following inpatient psych facilities as requested by the Attending:  [facility] [place]. [facility]  CSW will continue to assist with patient placement needs.  [facility] Disposition CSW [phone]

## 2014-11-29 DIAGNOSIS — F259 Schizoaffective disorder, unspecified: Secondary | ICD-10-CM

## 2014-11-29 DIAGNOSIS — J410 Simple chronic bronchitis: Secondary | ICD-10-CM

## 2014-11-29 NOTE — Progress Notes (Signed)
Was contacted by patient's nurse around 2350 for newly observed tremors and anxiety. Evaluated patient at bedside. Patient expressed anxiety over not being able to smoke cigarettes. She showed me her nicotine patch. She was able to control shaking in her hands and feet when asked. Recommended drinks and snacks to help with oral fixation rather than providing an anti-anxiety medication for now, given concern for over-sedation with presenting symptom of somnolence. Do not believe patient is withdrawing from EtOH given her ability to control shaking.
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
## 2014-11-30 DIAGNOSIS — R4182 Altered mental status, unspecified: Secondary | ICD-10-CM

## 2014-11-30 LAB — VALPROIC ACID LEVEL: Valproic Acid Lvl: 61 ug/mL (ref 50.0–100.0)
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
## 2014-11-30 NOTE — Discharge Instructions (Signed)
You were admitted [facility] for your breathing and because you seemed very mentally confused. You were found to have an acute COPD/Asthma exacerbation. You received steroids and breathing treatments for this issue, which resolved within the next 6-12 hours after admission.   If concerns arise about safety at home please call 911.   Please return to hospital or contact primary care physcian for shortness of breath or thoughts about wanting to hurt yourself or others    Chronic Obstructive Pulmonary Disease Chronic obstructive pulmonary disease (COPD) is a common lung problem. In COPD, the flow of air from the lungs is limited. The way your lungs work will probably never return to normal, but there are things you can do to improve your lungs and make yourself feel better. HOME CARE  Take all medicines as told by your doctor.  Avoid medicines or cough syrups that dry up your airway (such as antihistamines) and do not allow you to get rid of thick spit. You do not need to avoid them if told differently by your doctor.  If you smoke, stop. Smoking makes the problem worse.  Avoid being around things that make your breathing worse (like smoke, chemicals, and fumes).  Use oxygen therapy and therapy to help improve your lungs (pulmonary rehabilitation) if told by your doctor. If you need home oxygen therapy, ask your doctor if you should buy a tool to measure your oxygen level (oximeter).  Avoid people who have a sickness you can catch (contagious).  Avoid going outside when it is very hot, cold, or humid.  Eat healthy foods. Eat smaller meals more often. Rest before meals.  Stay active, but remember to also rest.  Make sure to get all the shots (vaccines) your doctor recommends. Ask your doctor if you need a pneumonia shot.  Learn and use tips on how to relax.  Learn and use tips on how to control your breathing as told by your doctor. Try:  Breathing in (inhaling) through your nose for 1  second. Then, pucker your lips and breath out (exhale) through your lips for 2 seconds.  Putting one hand on your belly (abdomen). Breathe in slowly through your nose for 1 second. Your hand on your belly should move out. Pucker your lips and breathe out slowly through your lips. Your hand on your belly should move in as you breathe out.  Learn and use controlled coughing to clear thick spit from your lungs. The steps are: 1. Lean your head a little forward. 2. Breathe in deeply. 3. Try to hold your breath for 3 seconds. 4. Keep your mouth slightly open while coughing 2 times. 5. Spit any thick spit out into a tissue. 6. Rest and do the steps again 1 or 2 times as needed. GET HELP IF:  You cough up more thick spit than usual.  There is a change in the color or thickness of the spit.  It is harder to breathe than usual.  Your breathing is faster than usual. GET HELP RIGHT AWAY IF:   You have shortness of breath while resting.  You have shortness of breath that stops you from:  Being able to talk.  Doing normal activities.  You chest hurts for longer than 5 minutes.  Your skin color is more blue than usual.  Your pulse oximeter shows that you have low oxygen for longer than 5 minutes. MAKE SURE YOU:   Understand these instructions.  Will watch your condition.  Will get help right  away if you are not doing well or get worse. Document Released: 09/13/2007 Document Revised: 08/11/2013 Document Reviewed: 11/21/2012 [facility] Patient Information 2015 [place], [place]. This information is not intended to replace advice given to you by your health care provider. Make sure you discuss any questions you have with your health care provider.
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
## 2014-11-30 NOTE — Care Management Note (Signed)
Case Management Note  Patient Details  Name: TYNESIA HARRAL MRN: [id number] Date of Birth: 09/28/1960  Subjective/Objective:    Patient is for dc today, per CSW note with [facility].                  Action/Plan:   Expected Discharge Date:  12/01/14               Expected Discharge Plan:  Home/Self Care  In-House Referral:  Clinical Social Work  Discharge planning Services  CM Consult  Post Acute Care Choice:    Choice offered to:     DME Arranged:    DME Agency:     HH Arranged:    HH Agency:     Status of Service:  Completed, signed off  Medicare Important Message Given:    Date Medicare IM Given:    Medicare IM give by:    Date Additional Medicare IM Given:    Additional Medicare Important Message give by:     If discussed at Long Length of Stay Meetings, dates discussed:    Additional Comments:  Leone Haven, RN 11/30/2014, 4:30 PM

## 2014-12-01 ENCOUNTER — Encounter (HOSPITAL_COMMUNITY): Payer: Self-pay

## 2014-12-01 ENCOUNTER — Emergency Department (HOSPITAL_COMMUNITY)
Admission: EM | Admit: 2014-12-01 | Discharge: 2014-12-07 | Disposition: A | Discharge status: Home / Self Care | Payer: Medicaid Other | Attending: Emergency Medicine | Admitting: Emergency Medicine

## 2014-12-01 ENCOUNTER — Encounter: Payer: Self-pay | Admitting: Family Medicine

## 2014-12-01 DIAGNOSIS — E119 Type 2 diabetes mellitus without complications: Secondary | ICD-10-CM | POA: Insufficient documentation

## 2014-12-01 DIAGNOSIS — F200 Paranoid schizophrenia: Secondary | ICD-10-CM | POA: Diagnosis not present

## 2014-12-01 DIAGNOSIS — J449 Chronic obstructive pulmonary disease, unspecified: Secondary | ICD-10-CM | POA: Insufficient documentation

## 2014-12-01 DIAGNOSIS — R45851 Suicidal ideations: Secondary | ICD-10-CM | POA: Diagnosis present

## 2014-12-01 DIAGNOSIS — H269 Unspecified cataract: Secondary | ICD-10-CM | POA: Insufficient documentation

## 2014-12-01 DIAGNOSIS — Z720 Tobacco use: Secondary | ICD-10-CM | POA: Diagnosis not present

## 2014-12-01 DIAGNOSIS — Z7951 Long term (current) use of inhaled steroids: Secondary | ICD-10-CM | POA: Insufficient documentation

## 2014-12-01 DIAGNOSIS — K219 Gastro-esophageal reflux disease without esophagitis: Secondary | ICD-10-CM | POA: Insufficient documentation

## 2014-12-01 DIAGNOSIS — Z01818 Encounter for other preprocedural examination: Secondary | ICD-10-CM

## 2014-12-01 DIAGNOSIS — Z9049 Acquired absence of other specified parts of digestive tract: Secondary | ICD-10-CM | POA: Diagnosis not present

## 2014-12-01 DIAGNOSIS — Z79899 Other long term (current) drug therapy: Secondary | ICD-10-CM | POA: Insufficient documentation

## 2014-12-01 DIAGNOSIS — B20 Human immunodeficiency virus [HIV] disease: Secondary | ICD-10-CM | POA: Diagnosis not present

## 2014-12-01 DIAGNOSIS — R45850 Homicidal ideations: Secondary | ICD-10-CM

## 2014-12-01 LAB — URINALYSIS, ROUTINE W REFLEX MICROSCOPIC
Bilirubin Urine: NEGATIVE
GLUCOSE, UA: NEGATIVE mg/dL
Hgb urine dipstick: NEGATIVE
Ketones, ur: NEGATIVE mg/dL
LEUKOCYTES UA: NEGATIVE
Nitrite: NEGATIVE
PH: 7 (ref 5.0–8.0)
Protein, ur: NEGATIVE mg/dL
SPECIFIC GRAVITY, URINE: 1.004 — AB (ref 1.005–1.030)
Urobilinogen, UA: 0.2 mg/dL (ref 0.0–1.0)

## 2014-12-01 LAB — COMPREHENSIVE METABOLIC PANEL
ALBUMIN: 4 g/dL (ref 3.5–5.0)
ALT: 23 U/L (ref 14–54)
ANION GAP: 9 (ref 5–15)
AST: 23 U/L (ref 15–41)
Alkaline Phosphatase: 88 U/L (ref 38–126)
BILIRUBIN TOTAL: 0.2 mg/dL — AB (ref 0.3–1.2)
BUN: 20 mg/dL (ref 6–20)
CO2: 25 mmol/L (ref 22–32)
Calcium: 8.9 mg/dL (ref 8.9–10.3)
Chloride: 102 mmol/L (ref 101–111)
Creatinine, Ser: 0.99 mg/dL (ref 0.44–1.00)
GFR calc Af Amer: >60 mL/min (ref >60)
GFR calc non Af Amer: >60 mL/min (ref >60)
GLUCOSE: 103 mg/dL — AB (ref 65–99)
POTASSIUM: 3.5 mmol/L (ref 3.5–5.1)
SODIUM: 136 mmol/L (ref 135–145)
TOTAL PROTEIN: 7.5 g/dL (ref 6.5–8.1)

## 2014-12-01 LAB — CBC
HEMATOCRIT: 39.1 % (ref 36.0–46.0)
Hemoglobin: 13.3 g/dL (ref 12.0–15.0)
MCH: 30.6 pg (ref 26.0–34.0)
MCHC: 34 g/dL (ref 30.0–36.0)
MCV: 90.1 fL (ref 78.0–100.0)
PLATELETS: 294 10*3/uL (ref 150–400)
RBC: 4.34 MIL/uL (ref 3.87–5.11)
RDW: 14.1 % (ref 11.5–15.5)
WBC: 11.3 10*3/uL — AB (ref 4.0–10.5)

## 2014-12-01 LAB — SALICYLATE LEVEL

## 2014-12-01 LAB — RAPID URINE DRUG SCREEN, HOSP PERFORMED
Amphetamines: NOT DETECTED
BENZODIAZEPINES: NOT DETECTED
Barbiturates: NOT DETECTED
COCAINE: NOT DETECTED
OPIATES: NOT DETECTED
Tetrahydrocannabinol: NOT DETECTED

## 2014-12-01 LAB — ACETAMINOPHEN LEVEL

## 2014-12-01 LAB — ETHANOL: Alcohol, Ethyl (B): <5 mg/dL (ref <5)

## 2014-12-01 LAB — HEMOGLOBIN A1C
HEMOGLOBIN A1C: 6.7 % — AB (ref 4.8–5.6)
Mean Plasma Glucose: 146 mg/dL

## 2014-12-01 MED ORDER — TRAZODONE HCL 50 MG PO TABS
50.0000 mg | ORAL_TABLET | Freq: Every day | ORAL | Status: DC
  Administered 2014-12-01 – 2014-12-06 (×6): 50 mg via ORAL
  Filled 2014-12-01 (×7): qty 1

## 2014-12-01 MED ORDER — BUDESONIDE 0.25 MG/2ML IN SUSP
2.0000 mL | Freq: Two times a day (BID) | RESPIRATORY_TRACT | Status: DC
  Administered 2014-12-01 – 2014-12-04 (×8): 0.25 mg via RESPIRATORY_TRACT
  Filled 2014-12-01 (×12): qty 2

## 2014-12-01 MED ORDER — BENZTROPINE MESYLATE 1 MG PO TABS
1.0000 mg | ORAL_TABLET | Freq: Every day | ORAL | Status: DC
  Administered 2014-12-01 – 2014-12-07 (×7): 1 mg via ORAL
  Filled 2014-12-01 (×7): qty 1

## 2014-12-01 MED ORDER — DIVALPROEX SODIUM 125 MG PO CSDR
500.0000 mg | DELAYED_RELEASE_CAPSULE | Freq: Every day | ORAL | Status: DC
  Administered 2014-12-01 – 2014-12-06 (×7): 500 mg via ORAL
  Filled 2014-12-01 (×8): qty 4

## 2014-12-01 MED ORDER — RALTEGRAVIR POTASSIUM 400 MG PO TABS
400.0000 mg | ORAL_TABLET | Freq: Two times a day (BID) | ORAL | Status: DC
  Administered 2014-12-01 – 2014-12-07 (×13): 400 mg via ORAL
  Filled 2014-12-01 (×15): qty 1

## 2014-12-01 MED ORDER — NICOTINE 21 MG/24HR TD PT24
21.0000 mg | MEDICATED_PATCH | Freq: Every day | TRANSDERMAL | Status: DC
  Administered 2014-12-01 – 2014-12-06 (×7): 21 mg via TRANSDERMAL
  Filled 2014-12-01 (×8): qty 1

## 2014-12-01 MED ORDER — EMTRICITABINE-TENOFOVIR DF 200-300 MG PO TABS
1.0000 | ORAL_TABLET | Freq: Every day | ORAL | Status: DC
  Administered 2014-12-01 – 2014-12-07 (×7): 1 via ORAL
  Filled 2014-12-01 (×7): qty 1

## 2014-12-01 MED ORDER — LOPERAMIDE HCL 2 MG PO CAPS
2.0000 mg | ORAL_CAPSULE | ORAL | Status: DC | PRN
  Administered 2014-12-01 – 2014-12-03 (×4): 2 mg via ORAL
  Filled 2014-12-01 (×4): qty 1

## 2014-12-01 MED ORDER — PANTOPRAZOLE SODIUM 40 MG PO TBEC
40.0000 mg | DELAYED_RELEASE_TABLET | Freq: Every day | ORAL | Status: DC
Start: 2014-12-01 — End: 2014-12-07
  Administered 2014-12-01 – 2014-12-07 (×7): 40 mg via ORAL
  Filled 2014-12-01 (×6): qty 1

## 2014-12-01 MED ORDER — ZIPRASIDONE MESYLATE 20 MG IM SOLR
20.0000 mg | Freq: Once | INTRAMUSCULAR | Status: AC
  Administered 2014-12-01: 20 mg via INTRAMUSCULAR
  Filled 2014-12-01: qty 20

## 2014-12-01 MED ORDER — ALBUTEROL SULFATE (2.5 MG/3ML) 0.083% IN NEBU
2.5000 mg | INHALATION_SOLUTION | Freq: Four times a day (QID) | RESPIRATORY_TRACT | Status: DC | PRN
  Administered 2014-12-01: 2.5 mg via RESPIRATORY_TRACT
  Filled 2014-12-01: qty 3

## 2014-12-01 MED ORDER — HALOPERIDOL LACTATE 5 MG/ML IJ SOLN
5.0000 mg | Freq: Once | INTRAMUSCULAR | Status: AC
  Administered 2014-12-01: 5 mg via INTRAMUSCULAR
  Filled 2014-12-01: qty 1

## 2014-12-01 MED ORDER — ALBUTEROL SULFATE HFA 108 (90 BASE) MCG/ACT IN AERS
2.0000 | INHALATION_SPRAY | Freq: Four times a day (QID) | RESPIRATORY_TRACT | Status: DC | PRN
  Administered 2014-12-01: 2 via RESPIRATORY_TRACT
  Filled 2014-12-01: qty 6.7

## 2014-12-01 MED ORDER — PALIPERIDONE ER 6 MG PO TB24
6.0000 mg | ORAL_TABLET | Freq: Every day | ORAL | Status: DC
  Administered 2014-12-01 – 2014-12-07 (×7): 6 mg via ORAL
  Filled 2014-12-01 (×8): qty 1

## 2014-12-01 MED ORDER — RITONAVIR 100 MG PO TABS
100.0000 mg | ORAL_TABLET | Freq: Every day | ORAL | Status: DC
  Administered 2014-12-01 – 2014-12-07 (×7): 100 mg via ORAL
  Filled 2014-12-01 (×8): qty 1

## 2014-12-01 MED ORDER — GABAPENTIN 400 MG PO CAPS
400.0000 mg | ORAL_CAPSULE | Freq: Every day | ORAL | Status: DC
  Administered 2014-12-01 – 2014-12-06 (×6): 400 mg via ORAL
  Filled 2014-12-01 (×6): qty 1

## 2014-12-01 MED ORDER — OLANZAPINE 10 MG PO TBDP
10.0000 mg | ORAL_TABLET | Freq: Three times a day (TID) | ORAL | Status: DC | PRN
  Administered 2014-12-01 – 2014-12-06 (×8): 10 mg via ORAL
  Filled 2014-12-01 (×8): qty 1

## 2014-12-01 MED ORDER — LORAZEPAM 1 MG PO TABS
1.0000 mg | ORAL_TABLET | Freq: Once | ORAL | Status: AC
  Administered 2014-12-01: 1 mg via ORAL
  Filled 2014-12-01: qty 1

## 2014-12-01 MED ORDER — DARUNAVIR ETHANOLATE 800 MG PO TABS
800.0000 mg | ORAL_TABLET | Freq: Every day | ORAL | Status: DC
  Administered 2014-12-01 – 2014-12-07 (×7): 800 mg via ORAL
  Filled 2014-12-01 (×8): qty 1

## 2014-12-01 NOTE — ED Notes (Signed)
Bed: [facility] Expected date: 12/01/14 Expected time: 1:36 AM Means of arrival:  Comments: Hold for Triage 3

## 2014-12-01 NOTE — BH Assessment (Signed)
[facility] Assessment Progress Note  The following facilities have been contacted to seek placement for this pt, with results as noted:  Beds available, information sent, decision pending:  [facility]  At capacity:  [facility]   Doylene Canning, [place] Triage Specialist [phone]

## 2014-12-01 NOTE — ED Notes (Signed)
Patient was sleeping when writer went to take vitals. Vitals will be take at a later time, attending RN notified.

## 2014-12-01 NOTE — BH Assessment (Addendum)
Pt being transferred to [facility]. Kevan Rosebush will contact this writer at [phone] when pt is ready for assessment..  Pt is ready to be assessed. Pt's brother is waiting in ED for disposition.   Clista Bernhardt, [facility] Triage Specialist 12/01/2014 2:32 AM

## 2014-12-01 NOTE — ED Notes (Signed)
Patient continues to walk up and down hallway.

## 2014-12-01 NOTE — ED Notes (Signed)
Patient has has four incontinent episodes and two showers.
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
## 2014-12-01 NOTE — BH Assessment (Signed)
Seeking inpt gero placement. Sent referrals to:  [facility], [place], [place], [place], [facility], [place]   [place], [place] Triage Specialist 12/01/2014 3:11 AM
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
## 2014-12-01 NOTE — ED Notes (Signed)
Patient continues to yell and scream. Patient is hard to re-direct at this time. Patient attempted to walked in treatment area 39 yelling a patient to get out. Staff re-directed patient to back to treat area 40. Encouragement and support provided and safety maintain. Q 15 min safety checks remain in place.

## 2014-12-01 NOTE — ED Notes (Signed)
Patient has 3 complete bed linen changes and 3 scrubs set changes.

## 2014-12-01 NOTE — ED Notes (Signed)
Pt has in belonging bag:  Brown bra, pink t-shit, pink pj pants, red sandals, two gold colored earrings

## 2014-12-01 NOTE — ED Notes (Signed)
TTS complete 

## 2014-12-01 NOTE — ED Notes (Signed)
Pt was just released from inpatient today and her brother picked her up and brought her here, pt has several unresolved issues

## 2014-12-01 NOTE — ED Notes (Signed)
Pt awake with labile mood. Observed yelling in dayroom at intervals during shift. Incontinent of stools X 2. Pt brother visited stated he is unable to take care of pt at present due to decline in her mental status as well as physical health. Per brother pt has not been taking shower or attending well to her ADLs, she's easily agitated when confronted about her needs. States he wants pt placed in a long term care facility with current status. Safety maintained on Q 15 minutes checks as ordered. Pt remains medication compliant. Encouragement and support offered. Pt transferred to CTU.
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
## 2014-12-02 NOTE — BH Assessment (Signed)
Reassessment 12/02/2014:  Patient denies SI, HI, and AVH's today. She reports sleeping well. Appetite is fair. Patient calm and cooperative. She is inquiring about ALF or group home placement. Patient sts that she would like to continue staying in the hospital to get the help she needs. Patient reports mild anxiety. Per psychiatrist, Dr Jannifer Franklin and Julieanne Cotton, NP inpatient hospitalization recommended. TTS to seek appropriate placement.

## 2014-12-02 NOTE — ED Notes (Signed)
Pt on unit to bed #40- loud, demanding. Disorganized on approach. She denies SI/HI. Denies AVH. Speech loud, garbled, rapid- redirection with much encouragement. Denies physical pain. Will continue to monitor on special checks q 15 mins for safety.

## 2014-12-02 NOTE — ED Notes (Signed)
Bed: [facility] Expected date:  Expected time:  Means of arrival:  Comments: [place]

## 2014-12-02 NOTE — Progress Notes (Signed)
Cm entered in EPIC d/c f/u section after noting in EPIC   Follow-up With Details Why Contact Info [name] Go on 12/04/2014 you have an already scheduled appt with [facility] on 12/04/14 at 1:30 pm Please go to appt or call to reschedule [address] [place] [address] [phone]

## 2014-12-02 NOTE — ED Notes (Signed)
Pt's brother, Kirstine Jacquin, Home: [phone] and Cell: [phone]

## 2014-12-02 NOTE — ED Notes (Signed)
Pt observed by staff ambulating to the restroom and using the bathroom.  Pt has not had any BM since 07:00 this morning.
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
## 2014-12-03 ENCOUNTER — Emergency Department (HOSPITAL_COMMUNITY): Payer: Medicaid Other

## 2014-12-03 DIAGNOSIS — F200 Paranoid schizophrenia: Secondary | ICD-10-CM | POA: Diagnosis not present

## 2014-12-03 DIAGNOSIS — Z01818 Encounter for other preprocedural examination: Secondary | ICD-10-CM | POA: Insufficient documentation

## 2014-12-03 NOTE — BH Assessment (Addendum)
Reassessment 12/03/2014:  Writer met with patient to complete a reassessment. She denies SI, HI, and AVH's. She is sleeping and eating well. Patient reports that she overall feels better but sleepy. Patient's speech is soft and slow. She inquires about discharging back to her brothers home or a group home. Patient sts that she is willing to live in a group home if needed. She also ask about going outside to smoke. Patient made aware that this is a non smoking facility. Patient encouraged to speaks with her nurse about a nicotine patch. Patient was overall calm and cooperative. Dr. Darleene Cleaver and Reginold Agent, NP recommend inpatient placement.

## 2014-12-03 NOTE — ED Notes (Signed)
Patient has been intrusive most of the day.  She has been hanging at the window at the nurses station.  Frequents requests for coffee and snacks.  She went for a chest xray this morning and had and EKG.  She was cooperative with both.  She is taking a tolerating medications well.  She denies any thoughts of harm to self or others.  She also denies auditory or visual hallucinations.  She had a visit from her ACT team member today.  He believes patient will likely need to move to an assisted living facility.  She has made multiple requests to call relatives to come and get her out of here.  She has had no episodes of incontinence this shift.

## 2014-12-03 NOTE — BH Assessment (Signed)
[facility] Assessment Progress Note  [facility]  Karis Juba #[id number] from 12/03/14 - 12/09/14.  Please note that authorization does not mean that pt has been accepted to the facility.  At 14:50 Robinette @ [facility] took demographic information verbally, and confirmed receipt of referral information by fax.  Decision pending.  Doylene Canning, MA Triage Specialist [phone]

## 2014-12-03 NOTE — Progress Notes (Signed)
CSW spoke with Lindaann Slough, pt sister, who is inquiring about pt disposition. CSW updated that at this time patient is recommended for inpatient psych treatment at this time. Patient family inquiring about alf or group home placement. CSW and pt sister discussed process for placement as well including fl2, pasarr number, applying for special assistance medicaid. CSW and pt family discussed that patient may not have immediate placement from the ED or inpatient psychiatric hosptial once pt is psychiatrically stable however pt actt team can continue to process for placement and CSW would assist with starting the process for placement. Pt sister provider contact information for Child psychotherapist at [facility] [place] of actt team, [place]: [phone] after 9am.   Pt sister also expressed concerns about patient addiction to Nicotine. Patient sister states that patient will go home, and will go outside to smoke a pack of cigarettes straight then has a "screaming and yelling fit" to get more cigaret. Patient sister states that then patient will go to the nearest place to get more cigarettes, beg strangers, and get cigarette butts of the street to attempt to smoke. Pt sister states that she has a substance abuse counselor however they are not addressing nicotine addictions  CSW informed psychiatrist of information regarding nicotine addiction.  Olga Coaster, LCSW  Clinical Social Work  [employer] [phone]

## 2014-12-03 NOTE — BH Assessment (Signed)
[facility] Assessment Progress Note  At 15:05 Junious Dresser calls from [facility].  Pt placed on wait list.  Doylene Canning, MA Triage Specialist [phone]
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
## 2014-12-03 NOTE — ED Notes (Signed)
Patient appears agitated, talking loud with rapid pressure speech. Patient will be medicated per MAR. Q 15 min safety checks remain in place.

## 2014-12-03 NOTE — ED Notes (Signed)
Pt awake, alert & responsive, no distress noted, confusion noted, Denies SI or HI, no AV hallucinations.  Monitoring for safety, Q 15 min checks in effect.

## 2014-12-03 NOTE — BHH Counselor (Signed)
Pending review for possible placement with [facility].  

## 2014-12-03 NOTE — ED Notes (Signed)
Patient had incontinent episode of stool. Patient had complete linen change and scrub change. Patient will be medicated per [facility] for loose stool. Encouragement and support provided and safety maintain. Q 15 min safety checks remain in place.

## 2014-12-03 NOTE — Progress Notes (Signed)
Pt referred to [facility], phone demographics completed with [place]. [facility] auth. [id number].   Olga Coaster, LCSW  Clinical Social Work  [employer] [phone]

## 2014-12-03 NOTE — BH Assessment (Signed)
BHH Assessment Progress Note  The following facilities have been contacted to seek placement for this pt, with results as noted:  Beds available, information sent, decision pending:  [place] [address] [place]   At capacity:  [facility]  Declined:  [facility] (needs longer term care)    Doylene Canning, [place] Triage Specialist [phone]

## 2014-12-03 NOTE — Progress Notes (Signed)
Late entry: CSW met with pt and pt brother at bedside to discuss ALF or group home placement once patient is psychiatrically stable. Patient and pt family feel that it would be best in order to keep patient on her medications. Pt states she is agreeable. CSW provided family with list of facilities to begin search. CSW explained process of applying for special assistance medicaid, fl2 to facilities, pasarr number by the state for appropriate level of care. Pt signed consent to release information to patient brother and sister.   [name], [place] Work  [employer] [phone]

## 2014-12-03 NOTE — ED Notes (Signed)
Patient had incontinent episode of urine. Complete linen changed done. Encouragement and support provided and safety maintain. Q 15 min safety checks remain in place.

## 2014-12-04 ENCOUNTER — Inpatient Hospital Stay: Payer: Self-pay | Admitting: Family Medicine

## 2014-12-04 MED ORDER — TRAZODONE HCL 50 MG PO TABS
50.0000 mg | ORAL_TABLET | Freq: Once | ORAL | Status: AC
  Administered 2014-12-04: 50 mg via ORAL

## 2014-12-04 NOTE — Progress Notes (Signed)
CSW reffered the patient to [facility] to try and obtain psychiatric placement.   Trish Mage [phone] ED CSW 12/04/2014 11:13 PM

## 2014-12-04 NOTE — Progress Notes (Signed)
Per Britta Mccreedy pt remains on [facility] waitlist.   Olga Coaster, LCSW  Clinical Social Work  [facility] [phone]

## 2014-12-04 NOTE — ED Notes (Signed)
Patient up at nurse's station requesting staff to call her guardian and brother.

## 2014-12-04 NOTE — BH Assessment (Addendum)
BHH Assessment Progress Note  The following facilities have been contacted to seek placement for this pt, with results as noted:  Beds available, information sent, decision pending:  [place] [facility]   At capacity:  [place] [facility] Del[facility] [place] [facility]   Declined:  [facility] (acuity and transportation problems)    Doylene Canning, [place] Triage Specialist [phone]

## 2014-12-04 NOTE — BH Assessment (Addendum)
Patient was reassessed on 12/04/2014.  Patient denies SI/HI and psychosis at this time. Patient states that she is in the hospital for "smoking cigarettes back to back." Patient states that she does not have any questions or concerns at this time. Patient requested this writer to call her sister and her brother and states "I'm getting my check today and I got to have my check."  Dr. Jannifer Franklin and Nanine Means, DNP continue to recommend inpatient placement at [facility] at this time.    Davina Poke, LCSW Therapeutic Triage Specialist [facility] 12/04/2014 9:59 AM

## 2014-12-04 NOTE — BHH Counselor (Signed)
[facility] is reviewing pt for possible inpatient placement. They have 1 female bed available and are considering this pt.    Cyndie Mull, [facility] Triage Specialist

## 2014-12-05 MED ORDER — BECLOMETHASONE DIPROPIONATE 40 MCG/ACT IN AERS
2.0000 | INHALATION_SPRAY | Freq: Two times a day (BID) | RESPIRATORY_TRACT | Status: DC
  Administered 2014-12-05 – 2014-12-07 (×5): 2 via RESPIRATORY_TRACT
  Filled 2014-12-05: qty 8.7

## 2014-12-05 NOTE — BH Assessment (Signed)
Patient was reassessed on 12/05/2014.  Patient denies SI/HI and psychosis at this time. Patient states that she is prepared to go home today. Patient was alert and oriented and continued to request this writer to call several people for her. Informed Psychiatrist of patient request.   Davina Poke, LCSW Therapeutic Triage Specialist [facility] 12/05/2014 11:31 AM'

## 2014-12-05 NOTE — Progress Notes (Signed)
D Pt. Denies SI and HI, no complaints of pain or discomfort noted at present time.  A Writer offered support and encouragement.  R Pt. Remains safe on the unit. 

## 2014-12-05 NOTE — Progress Notes (Signed)
D Pt. Is alert and oriented, denies SI and HI, no complaints of pain or discomfort noted at present time.  A Writer offered support and encouragement.  R Pt. Remains safe on the unit. 

## 2014-12-06 NOTE — BH Assessment (Signed)
Patient was reassessed 12/06/2014.  Patient denies SI/HI and psychosis at this time. Patient states that she does not have any questions or concerns.   Dr. Jannifer Franklin and Nanine Means, DNP continue to recommend inpatient treatment at this time.   Davina Poke, LCSW Therapeutic Triage Specialist [facility] 12/06/2014 12:29 PM

## 2014-12-06 NOTE — BHH Counselor (Signed)
12/06/14 at 01:55: [facility] called and confirmed they are considering pt for inpt tx. Pt will be reviewed in the morning.   Cyndie Mull, [facility]

## 2014-12-07 NOTE — Progress Notes (Signed)
Pt has signed consent to release information to Bridgett and Jerilynn Mages, pt brother and sister. Patient brother resides with patient brother, mckaylah bettendorf. Pt brother came to visit, and is refusing for patient to return home. Patient remains own guardian and has been residing with pt brother. Patient agreeing to continue group home/alf placement. Patient signed [facility] consent for [place] must/pasarr. Patient information to be faxed to pasarr. Patient brother states that he will just pull off from the hospital and leave her as he doesn't feel patientis psychiatrically stable. Pt brother stated he was going to call his older sister Driggs Sink. CSW explained again patient is psychiatrically stable and will be discharged today.   CSW colleague to follow up with patient and patient brother.   Olga Coaster, LCSW  Clinical Social Work  [employer] [phone]

## 2014-12-07 NOTE — BHH Suicide Risk Assessment (Signed)
Suicide Risk Assessment  Discharge Assessment   [facility] Discharge Suicide Risk Assessment   Demographic Factors:  NA  Total Time spent with patient: 30 minutes  Musculoskeletal: Strength & Muscle Tone: within normal limits Gait & Station: normal Patient leans: N/A  Psychiatric Specialty Exam:     Blood pressure 110/67, pulse 93, temperature 97.7 F (36.5 C), temperature source Oral, resp. rate 16, SpO2 99 %.There is no weight on file to calculate BMI.  General Appearance: Casual  Eye Contact::  Good  Speech:  Normal Rate409  Volume:  Normal  Mood:  Euthymic  Affect:  Blunt  Thought Process:  Coherent  Orientation:  Full (Time, Place, and Person)  Thought Content:  WDL  Suicidal Thoughts:  No  Homicidal Thoughts:  No  Memory:  Immediate;   Good Recent;   Good Remote;   Good  Judgement:  Fair  Insight:  Fair  Psychomotor Activity:  Normal  Concentration:  Good  Recall:  Good  Fund of Knowledge:Good  Language: Good  Akathisia:  No  Handed:  Right  AIMS (if indicated):     Assets:  Leisure Time Physical Health Resilience Social Support  Sleep:     Cognition: WNL  ADL's:  Intact      Has this patient used any form of tobacco in the last 30 days? (Cigarettes, Smokeless Tobacco, Cigars, and/or Pipes) No  Mental Status Per Nursing Assessment::   On Admission:   homicidal ideations  Current Mental Status by Physician: NA  Loss Factors: NA  Historical Factors: NA  Risk Reduction Factors:   Sense of responsibility to family, Living with another person, especially a relative, Positive social support and Positive therapeutic relationship  Continued Clinical Symptoms:  None  Cognitive Features That Contribute To Risk:  None    Suicide Risk:  Minimal: No identifiable suicidal ideation.  Patients presenting with no risk factors but with morbid ruminations; may be classified as minimal risk based on the severity of the depressive symptoms  Principal Problem:  Paranoid schizophrenia Discharge Diagnoses:  Patient Active Problem List   Diagnosis Date Noted  . Paranoid schizophrenia [F20.0]     Priority: High  . Encounter for preadmission testing [Z01.818]   . Diabetes [E11.9] 12/01/2014  . COLD (chronic obstructive lung disease) [J44.9]   . Homicidal ideation [R45.850]   . Hypoxia [R09.02]   . Lethargy [R53.83]   . Wheezing [R06.2]   . 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 Asthma, chronic [J45.909]   . HIV disease [B20]   . 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 Psychosis [F29] 10/30/2014  . Psychoses [F29]   . Hyponatremia [E87.1] 10/22/2014  . Microscopic hematuria [R31.2] 01/29/2014  . Abdominal pain [R10.9] 12/12/2013  . Vulvar irritation [N90.89] 12/12/2013  . COPD (chronic obstructive pulmonary disease) [J44.9] 02/18/2013  . Tobacco use disorder [Z72.0] 02/18/2013  . Hepatitis B infection [B16.9]   . Pre-ulcerative corn or callous [L84] 05/07/2012  . Urinary incontinence, nocturnal enuresis [N39.44] 05/07/2012  . Fecal incontinence [R15.9] 05/07/2012  . Systolic murmur [R01.1] 05/07/2012  . Routine health maintenance [Z00.00] 05/07/2012  . Asthma [493] 08/18/2010  . HIV (human immunodeficiency virus infection) [Z21] 08/18/2010  . Schizophrenia [F20.9] 08/18/2010  . Herpes simplex infection [B00.9] 08/18/2010    Follow-up Information    Follow up with Levert Feinstein, MD. Go on 12/04/2014.   Specialty:  Family Medicine   Why:  you have an already scheduled appt with [facility] on 12/04/14 at 1:30 pm  Please go to appt or call to reschedule   Contact information:   [address] [place] [address] [phone]       Call [facility] Actt team.   Contact information:   [address] [place], [place] [address]  [phone] Crisis Line [phone]       Plan Of Care/Follow-up recommendations:  Activity:  as tolerated Diet:  heart healthy diet  Is patient on multiple antipsychotic  therapies at discharge:  No   Has Patient had three or more failed trials of antipsychotic monotherapy by history:  No  Recommended Plan for Multiple Antipsychotic Therapies: NA    Jeralyn Nolden, PMH-NP 12/07/2014, 12:09 PM

## 2014-12-07 NOTE — Progress Notes (Signed)
CSW spoke with patient to ensure she was aware of discharge. NP was present during this time, and states she is psychiatrically clear.   CSW provided supportive counseling for patient and spoke with her about following rules, being respectful, and using good communication skills while staying with brother. Patient verbalizes understanding. Patient states that she does not have any questions for CSW. CSW and Nurse escorted patient outside to parking lot, where her brother Justis Dupas) was waiting in his car.  CSW spoke with brother outside, who initially stated that he would not take the patient. Brother appeared frustrated and upset. Brother expressed that he is upset that the patient is being discharged, and he does not feel she is ready. Brother states that he is not happy with the patient's chain smoking. Brother confirms that the patient receives disability. CSW was able to deescalate brother, who eventually agreed that he will take the patient back home with him. However, he says " She will end up right back here tonight ".   Trish Mage [phone] ED CSW 12/07/2014 5:20 PM

## 2014-12-07 NOTE — ED Notes (Signed)
Pt awoke and was incontinent of urine.

## 2014-12-07 NOTE — BH Assessment (Signed)
Patient was reassessed on 12/07/2014.  Patient denies current SI/HI and psychosis. Patient stating she is ready to discharge to a group home. Sts, "I  Have a cookout to attend so I need to get out of this place"

## 2014-12-07 NOTE — Consult Note (Signed)
Patient, [name], is psychiatrically stable and no longer meets criteria for in-patient psychiatric hospitalization. Patient has been cleared by this provider and Dr. Thedore Mins, psychiatrist.  Nanine Means, PMH-NP 12/07/2014 Patient seen face-to-face for psychiatric evaluation, chart reviewed and case discussed with the physician extender and developed treatment plan. Reviewed the information documented and agree with the treatment plan. Thedore Mins, MD

## 2014-12-07 NOTE — BH Assessment (Signed)
[facility] Assessment Progress Note  Junious Dresser at [facility] calls at 08:43.  Pt remains on their wait list.  Doylene Canning, MA Triage Specialist [phone]

## 2014-12-07 NOTE — Progress Notes (Signed)
Per psychiatrist and NP, patient is psychiatrically stable for discharge. Patient, patient family, and patient actt team interested in group home/alf placement for patient. CSW intitated process by completing fl2, and applying for pasarr number. Pt will have to complete [place] must interview at home in order to obtain pasarr number for group home or assisted living placement. CSW spoke with pt actt team who will continue to help with group home/alf placement. Pt interested in [facility].   CSW called pt brother who did not answer the phone. Pt remains her own guardian. Pt states she does not have a way into the house, and patient brother had 2 appointments today and after would be coming to the hospital. CSW explained process of going to alf/group home with patient. Patient also aware that her cigarettes will need to belimited to one an hour. CSW and pt discussed A group home or alf may limit cigarettes even more, and pt remains agreeable to placement.   Olga Coaster, LCSW  Clinical Social Work  [employer] [phone]

## 2014-12-07 NOTE — ED Notes (Signed)
Patient discharged to her brothers home.  She had no belongings.  Patient is no longer manic and is taking her medications as prescribed.  Her sleep and diet are good.  She has been cooperative on the unit and verbalizes understanding of her brothers house rules.  Her brother was upset about her leaving and stated she would be back tonight.  We encouraged him to state his rules and ensure that she follow them.  We also informed him that the paperwork has been initiated to get her into an assisted living facility and encouraged him to follow up with her workers at [facility].  She was escorted out to the parking lot where her brother was waiting.

## 2014-12-17 ENCOUNTER — Emergency Department (HOSPITAL_COMMUNITY): Payer: Medicaid Other

## 2014-12-17 ENCOUNTER — Encounter (HOSPITAL_COMMUNITY): Payer: Self-pay | Admitting: Emergency Medicine

## 2014-12-17 ENCOUNTER — Inpatient Hospital Stay (HOSPITAL_COMMUNITY)
Admission: EM | Admit: 2014-12-17 | Discharge: 2014-12-21 | DRG: 190 | Disposition: A | Discharge status: Home / Self Care | Payer: Medicaid Other | Attending: Family Medicine | Admitting: Family Medicine

## 2014-12-17 DIAGNOSIS — R404 Transient alteration of awareness: Secondary | ICD-10-CM | POA: Diagnosis not present

## 2014-12-17 DIAGNOSIS — F200 Paranoid schizophrenia: Secondary | ICD-10-CM | POA: Diagnosis present

## 2014-12-17 DIAGNOSIS — F1290 Cannabis use, unspecified, uncomplicated: Secondary | ICD-10-CM | POA: Diagnosis present

## 2014-12-17 DIAGNOSIS — R0902 Hypoxemia: Secondary | ICD-10-CM | POA: Diagnosis present

## 2014-12-17 DIAGNOSIS — Z79899 Other long term (current) drug therapy: Secondary | ICD-10-CM

## 2014-12-17 DIAGNOSIS — G9340 Encephalopathy, unspecified: Secondary | ICD-10-CM | POA: Diagnosis present

## 2014-12-17 DIAGNOSIS — F17210 Nicotine dependence, cigarettes, uncomplicated: Secondary | ICD-10-CM | POA: Diagnosis present

## 2014-12-17 DIAGNOSIS — B20 Human immunodeficiency virus [HIV] disease: Secondary | ICD-10-CM | POA: Diagnosis present

## 2014-12-17 DIAGNOSIS — R159 Full incontinence of feces: Secondary | ICD-10-CM | POA: Diagnosis present

## 2014-12-17 DIAGNOSIS — K219 Gastro-esophageal reflux disease without esophagitis: Secondary | ICD-10-CM | POA: Diagnosis present

## 2014-12-17 DIAGNOSIS — E119 Type 2 diabetes mellitus without complications: Secondary | ICD-10-CM | POA: Diagnosis present

## 2014-12-17 DIAGNOSIS — Z23 Encounter for immunization: Secondary | ICD-10-CM

## 2014-12-17 DIAGNOSIS — R4182 Altered mental status, unspecified: Secondary | ICD-10-CM

## 2014-12-17 DIAGNOSIS — R400 Somnolence: Secondary | ICD-10-CM | POA: Diagnosis not present

## 2014-12-17 DIAGNOSIS — J441 Chronic obstructive pulmonary disease with (acute) exacerbation: Secondary | ICD-10-CM | POA: Diagnosis present

## 2014-12-17 DIAGNOSIS — J80 Acute respiratory distress syndrome: Secondary | ICD-10-CM | POA: Diagnosis not present

## 2014-12-17 DIAGNOSIS — R0602 Shortness of breath: Secondary | ICD-10-CM

## 2014-12-17 DIAGNOSIS — J45909 Unspecified asthma, uncomplicated: Secondary | ICD-10-CM | POA: Diagnosis present

## 2014-12-17 DIAGNOSIS — R0603 Acute respiratory distress: Secondary | ICD-10-CM | POA: Insufficient documentation

## 2014-12-17 DIAGNOSIS — F203 Undifferentiated schizophrenia: Secondary | ICD-10-CM | POA: Diagnosis not present

## 2014-12-17 LAB — BASIC METABOLIC PANEL
Anion gap: 9 (ref 5–15)
BUN: 13 mg/dL (ref 6–20)
CO2: 26 mmol/L (ref 22–32)
Calcium: 8.8 mg/dL — ABNORMAL LOW (ref 8.9–10.3)
Chloride: 101 mmol/L (ref 101–111)
Creatinine, Ser: 1.03 mg/dL — ABNORMAL HIGH (ref 0.44–1.00)
GFR calc Af Amer: >60 mL/min (ref >60)
GFR calc non Af Amer: >60 mL/min (ref >60)
Glucose, Bld: 118 mg/dL — ABNORMAL HIGH (ref 65–99)
Potassium: 4.7 mmol/L (ref 3.5–5.1)
Sodium: 136 mmol/L (ref 135–145)

## 2014-12-17 LAB — BLOOD GAS, ARTERIAL
Acid-Base Excess: 0.6 mmol/L (ref 0.0–2.0)
Bicarbonate: 24.6 mEq/L — ABNORMAL HIGH (ref 20.0–24.0)
Drawn by: 441261
O2 Content: 2.5 L/min
O2 Saturation: 87.2 %
Patient temperature: 99
TCO2: 22.1 mmol/L (ref 0–100)
pCO2 arterial: 39.5 mmHg (ref 35.0–45.0)
pH, Arterial: 7.412 (ref 7.350–7.450)
pO2, Arterial: 61.3 mmHg — ABNORMAL LOW (ref 80.0–100.0)

## 2014-12-17 LAB — CBC
HCT: 37.6 % (ref 36.0–46.0)
Hemoglobin: 12.5 g/dL (ref 12.0–15.0)
MCH: 30.8 pg (ref 26.0–34.0)
MCHC: 33.2 g/dL (ref 30.0–36.0)
MCV: 92.6 fL (ref 78.0–100.0)
Platelets: 230 10*3/uL (ref 150–400)
RBC: 4.06 MIL/uL (ref 3.87–5.11)
RDW: 16.2 % — ABNORMAL HIGH (ref 11.5–15.5)
WBC: 7.3 10*3/uL (ref 4.0–10.5)

## 2014-12-17 LAB — BRAIN NATRIURETIC PEPTIDE: B Natriuretic Peptide: 63.1 pg/mL (ref 0.0–100.0)

## 2014-12-17 LAB — RAPID URINE DRUG SCREEN, HOSP PERFORMED
Amphetamines: NOT DETECTED
Barbiturates: NOT DETECTED
Benzodiazepines: NOT DETECTED
Cocaine: NOT DETECTED
Opiates: NOT DETECTED
Tetrahydrocannabinol: POSITIVE — AB

## 2014-12-17 LAB — ETHANOL: Alcohol, Ethyl (B): <5 mg/dL (ref <5)

## 2014-12-17 LAB — VALPROIC ACID LEVEL: Valproic Acid Lvl: 54 ug/mL (ref 50.0–100.0)

## 2014-12-17 LAB — CBG MONITORING, ED: Glucose-Capillary: 122 mg/dL — ABNORMAL HIGH (ref 65–99)

## 2014-12-17 LAB — ACETAMINOPHEN LEVEL: Acetaminophen (Tylenol), Serum: <10 ug/mL — ABNORMAL LOW (ref 10–30)

## 2014-12-17 LAB — TROPONIN I: Troponin I: <0.03 ng/mL (ref <0.031)

## 2014-12-17 LAB — SALICYLATE LEVEL: Salicylate Lvl: <4 mg/dL (ref 2.8–30.0)

## 2014-12-17 MED ORDER — DIVALPROEX SODIUM 125 MG PO CSDR
500.0000 mg | DELAYED_RELEASE_CAPSULE | Freq: Every day | ORAL | Status: DC
  Administered 2014-12-17 – 2014-12-20 (×4): 500 mg via ORAL
  Filled 2014-12-17 (×4): qty 4

## 2014-12-17 MED ORDER — ALBUTEROL SULFATE (2.5 MG/3ML) 0.083% IN NEBU: 2.5000 mg | INHALATION_SOLUTION | RESPIRATORY_TRACT | Status: DC | PRN

## 2014-12-17 MED ORDER — ALBUTEROL SULFATE (2.5 MG/3ML) 0.083% IN NEBU
5.0000 mg | INHALATION_SOLUTION | Freq: Once | RESPIRATORY_TRACT | Status: AC
  Administered 2014-12-17: 5 mg via RESPIRATORY_TRACT
  Filled 2014-12-17: qty 6

## 2014-12-17 MED ORDER — IPRATROPIUM-ALBUTEROL 0.5-2.5 (3) MG/3ML IN SOLN
RESPIRATORY_TRACT | Status: AC
  Filled 2014-12-17: qty 3

## 2014-12-17 MED ORDER — AZITHROMYCIN 500 MG PO TABS
250.0000 mg | ORAL_TABLET | Freq: Every day | ORAL | Status: DC
  Administered 2014-12-18 – 2014-12-20 (×3): 250 mg via ORAL
  Filled 2014-12-17 (×3): qty 1

## 2014-12-17 MED ORDER — ALBUTEROL (5 MG/ML) CONTINUOUS INHALATION SOLN
15.0000 mg/h | INHALATION_SOLUTION | RESPIRATORY_TRACT | Status: DC
  Administered 2014-12-17: 15 mg/h via RESPIRATORY_TRACT
  Filled 2014-12-17: qty 20

## 2014-12-17 MED ORDER — BUDESONIDE 0.25 MG/2ML IN SUSP
0.2500 mg | Freq: Two times a day (BID) | RESPIRATORY_TRACT | Status: DC
  Administered 2014-12-17 – 2014-12-18 (×2): 0.25 mg via RESPIRATORY_TRACT
  Filled 2014-12-17 (×2): qty 2

## 2014-12-17 MED ORDER — AZITHROMYCIN 500 MG PO TABS
500.0000 mg | ORAL_TABLET | Freq: Every day | ORAL | Status: AC
  Administered 2014-12-17: 500 mg via ORAL
  Filled 2014-12-17: qty 1

## 2014-12-17 MED ORDER — HEPARIN SODIUM (PORCINE) 5000 UNIT/ML IJ SOLN
5000.0000 [IU] | Freq: Three times a day (TID) | INTRAMUSCULAR | Status: DC
  Administered 2014-12-17 – 2014-12-21 (×10): 5000 [IU] via SUBCUTANEOUS
  Filled 2014-12-17 (×12): qty 1

## 2014-12-17 MED ORDER — EMTRICITABINE-TENOFOVIR DF 200-300 MG PO TABS
1.0000 | ORAL_TABLET | Freq: Every day | ORAL | Status: DC
  Administered 2014-12-17 – 2014-12-21 (×5): 1 via ORAL
  Filled 2014-12-17 (×5): qty 1

## 2014-12-17 MED ORDER — RITONAVIR 100 MG PO TABS
100.0000 mg | ORAL_TABLET | Freq: Every day | ORAL | Status: DC
  Administered 2014-12-20 – 2014-12-21 (×2): 100 mg via ORAL
  Filled 2014-12-17 (×4): qty 1

## 2014-12-17 MED ORDER — DARUNAVIR ETHANOLATE 800 MG PO TABS
800.0000 mg | ORAL_TABLET | Freq: Every day | ORAL | Status: DC
  Administered 2014-12-18 – 2014-12-21 (×4): 800 mg via ORAL
  Filled 2014-12-17 (×4): qty 1

## 2014-12-17 MED ORDER — DEXTROSE-NACL 5-0.45 % IV SOLN
INTRAVENOUS | Status: DC
  Administered 2014-12-17: 21:00:00 via INTRAVENOUS
  Administered 2014-12-19: 1000 mL via INTRAVENOUS
  Administered 2014-12-19 – 2014-12-20 (×2): via INTRAVENOUS

## 2014-12-17 MED ORDER — RALTEGRAVIR POTASSIUM 400 MG PO TABS
400.0000 mg | ORAL_TABLET | Freq: Two times a day (BID) | ORAL | Status: DC
  Administered 2014-12-17 – 2014-12-21 (×7): 400 mg via ORAL
  Filled 2014-12-17 (×13): qty 1

## 2014-12-17 MED ORDER — IPRATROPIUM-ALBUTEROL 0.5-2.5 (3) MG/3ML IN SOLN
3.0000 mL | RESPIRATORY_TRACT | Status: DC
  Administered 2014-12-17 – 2014-12-19 (×9): 3 mL via RESPIRATORY_TRACT
  Filled 2014-12-17 (×8): qty 3

## 2014-12-17 MED ORDER — PREDNISONE 50 MG PO TABS
50.0000 mg | ORAL_TABLET | Freq: Every day | ORAL | Status: DC
  Administered 2014-12-18: 50 mg via ORAL
  Filled 2014-12-17: qty 1

## 2014-12-17 NOTE — ED Notes (Signed)
Alerted MD that pt was sweating profusely, and remaining lethargic. MD in to see patient.

## 2014-12-17 NOTE — ED Notes (Addendum)
Alerted MD to patient becoming more lethargic, slurred speech, difficult to arouse. MD at bedside. Giving breathing treatment, checking CBG, pupils pinpoint (unknown if that's new compared to arrival to ED)

## 2014-12-17 NOTE — ED Notes (Signed)
Pt still lethargic. Only responds to pain. Will still not wake up to answer questions. ED doc stated to come alert him if vitals change. Vitals stable, pt continues to sleep. 3L O2 on simple mask.

## 2014-12-17 NOTE — ED Notes (Signed)
Per EMS pt from home/lives with brother, complaining of SOB x 2 days, wheezing, hx of COPD, 79% on RA. EMS gave 5 mg Albuterol, 0.5 Atrovent, and 125 Solumedrol in route. Pt had taken 2 neb treatments at home prior to EMS arrival. When EMS got there pt had to have a cigarette prior to leaving home. Per EMS hx of violent tendencies and HIV+. Now down to a pack a day smoker. EMS states wheezing throughout all lung fields.  Brother: Ranessa Kosta [phone] (home phone)

## 2014-12-17 NOTE — ED Notes (Signed)
Bed: ZO10 Expected date:  Expected time:  Means of arrival:  Comments: Ems- 54 yo m, shob, copd

## 2014-12-17 NOTE — H&P (Signed)
Family Medicine Teaching [facility] Admission History and Physical Service Pager: [phone]  Patient name: [name] Medical record number: [id number] Date of birth: [birth year] Age: 54 y.o. Gender: female  Primary Care Provider: Levert Feinstein, MD Consultants: Psychiatry Code Status: Full  Chief Complaint: Altered Mental Status, Hypoxia  Assessment and Plan: [name] is a 54 y.o. female presenting with altered mental status and hypoxia. PMH is significant for COPD, schizophrenia, HIV, hep B, and tobacco abuse.  Acute Encephalopathy. Differential includes hypoxia, and psychiatric etiologies. Of note, patient was admitted last month with the same presentation. Work up at that time was largely unremarkable and patient rapidly improved after admission. Must consider medication overdose. UDS only positive for THC. Ethanol, salicylate, and acetaminophen levels normal. ABG without hypercarbia. No fevers or leukocytosis to suggest infection. BMP generally unremarkable. No hypoglycemia. Valproic acid level in therapeutic range.  - Will obtain head CT - Anticipate improvement with resolution of hypoxia - Hold all sedating medications until mental status improves - Consider EEG if not improving - Consider psychiatry consult in the morning  Hypoxia secondary to acute COPD exacerbation. Patient with hypoxia and diffuse wheezing consistent with COPD exacerbation. Also endorses increased cough and sputum production, though history likely not reliable. No fever or leukocytosis to suggest pneumonia, though patient is at increased risk for aspiration given somnolent state. CXR with mild interstitial thickening. - Supplemental oxygen as needed to keep sats >88% - Continue pulmicort bid - Duoneb q4hrs / albuterol q2hrs prn - s/p solumedrol via EMS - Prednisone (9/9- ) - Azithromycin (9/8 - )  Schizophrenia. Patient with recent history of homicidal ideation. [name] psychosis may be  contributing to altered mental status.  - Hold home seroquel, trazodone, gabapentin, and ambien until mental status improve - Continue home depakote.  HIV / Hep B - Truvada, Prezista, raltegravir, ritonavir  Tobacco Abuse - Will supply nicotine patch upon request  FEN/GI: NPO, D5 1/2NS @75cc /hr Prophylaxis: SubQ heparin  Disposition: Admit to FMTS, attending Dr McDiarmid.   History of Present Illness:  [name] is a 54 y.o. female presenting with altered mental status and hypoxia. History is provided by the patient, however is limited secondary to her AMS.  Patient is able to state that she is in the hospital but is unable to state why she was brought to the hospital earlier today. On further questioning, the patient said "yes" when asked if she was having shortness of breath, and said "no" when asked if she was having any chest pain. Patient is unable to clearly state the date or year. Patient denies any recent falls. Endorses increased cough with some sputum production. Denies any fevers or chills. States that she has been taking her medication without missing any doses or taking any extra doses.  States that she feels safe and does not feel like anyone is out to hurt her.   Per chart review, patient presented to [facility] via EMS with shortness of breath and wheezing for 2 days. On arrival to the ED, patient was noted to by hypoxic and started on supplemental oxygen. She gradually became increasingly lethargic and the FMTS was called for admission.  Review Of Systems: Per HPI.  Patient Active Problem List   Diagnosis Date Noted  . Hypoxemia 12/17/2014  . Encounter for preadmission testing   . Diabetes 12/01/2014  . COLD (chronic obstructive lung disease)   . Homicidal ideation   . Hypoxia   . Lethargy   . Wheezing   .  Delusional disorder   . Asthma, chronic   . HIV disease   . 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 Paranoid schizophrenia   .  Psychosis 10/30/2014  . Psychoses   . Hyponatremia 10/22/2014  . Microscopic hematuria 01/29/2014  . Abdominal pain 12/12/2013  . Vulvar irritation 12/12/2013  . COPD (chronic obstructive pulmonary disease) 02/18/2013  . Tobacco use disorder 02/18/2013  . Hepatitis B infection   . Pre-ulcerative corn or callous 05/07/2012  . Urinary incontinence, nocturnal enuresis 05/07/2012  . Fecal incontinence 05/07/2012  . Systolic murmur 05/07/2012  . Routine health maintenance 05/07/2012  . Asthma 08/18/2010  . HIV (human immunodeficiency virus infection) 08/18/2010  . Schizophrenia 08/18/2010  . Herpes simplex infection 08/18/2010   Past Medical History: Past Medical History  Diagnosis Date  . GERD (gastroesophageal reflux disease)   . Schizophrenia   . AIDS 12-10-2007  . Herpes simplex   . History of cholecystectomy   . Cataract   . Nonspecific reaction to tuberculin skin test without active tuberculosis 11-2008 [facility]   . Seizures   . Asthmatic bronchitis , chronic   . Hepatitis B infection     followed by ID  . Chronic asthmatic bronchitis   . Tobacco use    Past Surgical History: Past Surgical History  Procedure Laterality Date  . Cholecystectomy    . Leg surgery      right leg, s/p accident   Social History: Social History  Substance Use Topics  . Smoking status: Current Every Day Smoker -- 2.00 packs/day for 35 years    Types: Cigarettes    Start date: 04/10/1974  . Smokeless tobacco: Never Used     Comment: Previously smoked 2ppd, might start patch  . Alcohol Use: No     Comment: Patient denies    Please also refer to relevant sections of EMR.  Family History: Family History  Problem Relation Age of Onset  . Diabetes Sister   . Cancer Sister     breast  . Diabetes Brother   . Hypertension Brother   . Cancer Mother     lung  . Hypertension Mother   . Heart disease Father    Allergies and Medications: No Known Allergies No current facility-administered  medications on file prior to encounter.   Current Outpatient Prescriptions on File Prior to Encounter  Medication Sig Dispense Refill  . albuterol (PROVENTIL HFA;VENTOLIN HFA) 108 (90 BASE) MCG/ACT inhaler Inhale 2 puffs into the lungs every 6 (six) hours as needed for wheezing or shortness of breath.    Marland Kitchen albuterol (PROVENTIL) (2.5 MG/3ML) 0.083% nebulizer solution Take 2.5 mg by nebulization every 6 (six) hours as needed for wheezing or shortness of breath.    . beclomethasone (QVAR) 80 MCG/ACT inhaler Inhale 1 puff into the lungs 2 (two) times daily.    . Darunavir Ethanolate (PREZISTA) 800 MG tablet Take 1 tablet (800 mg total) by mouth daily. 30 tablet 5  . divalproex (DEPAKOTE SPRINKLE) 125 MG capsule Take 4 capsules (500 mg total) by mouth at bedtime. 120 capsule 0  . emtricitabine-tenofovir (TRUVADA) 200-300 MG per tablet Take 1 tablet by mouth daily. 30 tablet 5  . gabapentin (NEURONTIN) 400 MG capsule Take 400 mg by mouth at bedtime.     . pantoprazole (PROTONIX) 40 MG tablet Take 1 tablet (40 mg total) by mouth daily. 30 tablet 0  . raltegravir (ISENTRESS) 400 MG tablet Take 1 tablet (400 mg total) by mouth 2 (two) times  daily. 60 tablet 5  . ranitidine (ZANTAC) 150 MG tablet Take 150 mg by mouth 2 (two) times daily.    . ritonavir (NORVIR) 100 MG TABS tablet Take 1 tablet (100 mg total) by mouth daily. 30 tablet 5  . [START ON 12/18/2014] paliperidone (INVEGA SUSTENNA) 234 MG/1.5ML SUSP injection Inject 234 mg into the muscle once. 0.9 mL 0  . risperiDONE (RISPERDAL M-TABS) 3 MG disintegrating tablet Take 1 tablet (3 mg total) by mouth 2 (two) times daily. (Patient not taking: Reported on 12/17/2014) 60 tablet 0    Objective: BP 100/58 mmHg  Pulse 82  Temp(Src) 98.4 F (36.9 C) (Rectal)  Resp 20  Ht  (1.575 m)  Wt 150 lb (68.04 kg)  BMI 27.43 kg/m2  SpO2 92% Exam: General: 54 year old woman in NAD, lying in hospital bed eyes closed Eyes: EOMI, PERRL, opens eyes to  voice ENTM: O/P clear, MMM Neck: FROM, supple Cardiovascular: RRR, no murmurs appreciated Respiratory: NWOB, diffuse wheezes throughout, good air movement Abdomen: Obese, Soft, NT, ND MSK: No cyanosis or edema noted. Linear erythematous marks on upper extremities noted.  Skin: As above, but no rashes noted Neuro: Opens eyes to voice Oriented only to place. Moves all extremities spontaneously.  Psych: Disorganized speech. Speaking in sentences, though speech largely intelligible. No apparent AVH.   Labs and Imaging: CBC BMET   Recent Labs Lab 12/17/14 1421  WBC 7.3  HGB 12.5  HCT 37.6  PLT 230    Recent Labs Lab 12/17/14 1421  NA 136  K 4.7  CL 101  CO2 26  BUN 13  CREATININE 1.03*  GLUCOSE 118*  CALCIUM 8.8*     ABG: 7.412 / 39.5 / 24.6 Salycilate <4.0 Valproic Acid 54 Acetaminophen <10 Ethanol <5  Troponin <0.03 BNP 63.1  UDS: Positive for THC  EKG: Sinus Tachycardia (HR101), no acute ischemic changes, QTc 417  Dg Chest Port 1 View  12/17/2014   CLINICAL DATA:  Shortness of breath for 2 days. Wheezing. History of COPD.  EXAM: PORTABLE CHEST - 1 VIEW  COMPARISON:  12/03/2014  FINDINGS: Cardiomediastinal silhouette is normal. Mediastinal contours appear intact.  There is no evidence of focal airspace consolidation, pleural effusion or pneumothorax. There is coarsening of the interstitial markings. Previously demonstrated calcified few mm pulmonary nodules are again seen.  Osseous structures are without acute abnormality. Soft tissues are grossly normal.  IMPRESSION: Coarsening of the interstitial markings which may represent peribronchial thickening seen with reactive airway disease or bronchitis. Alternatively, developing interstitial pulmonary edema may have a similar appearance.   Electronically Signed   By: Ted Mcalpine M.D.   On: 12/17/2014 14:41    Ardith Dark, MD 12/17/2014, 7:32 PM PGY-2, [facility] FPTS Intern pager: [phone],  text pages welcome

## 2014-12-17 NOTE — ED Provider Notes (Signed)
CSN: [id number]     Arrival date & time 12/17/14  1238 History   First MD Initiated Contact with Patient 12/17/14 1247     Chief Complaint  Patient presents with  . Shortness of Breath  . Wheezing     (Consider location/radiation/quality/duration/timing/severity/associated sxs/prior Treatment) HPI   54yf with dyspnea. Pt is drowsy and history difficult to obtain. She reports increasing SOB and wheezing starting two day ago. Increased cough. No fever. Denies acute pain anywhere. No unusual leg pain or swelling. Reports compliance with meds. Per nursing, when patient arrived to ED she was much more alert and could have conversation.   Past Medical History  Diagnosis Date  . GERD (gastroesophageal reflux disease)   . Schizophrenia   . AIDS 12-10-2007  . Herpes simplex   . History of cholecystectomy   . Cataract   . Nonspecific reaction to tuberculin skin test without active tuberculosis 11-2008 [facility]   . Seizures   . Asthmatic bronchitis , chronic   . Hepatitis B infection     followed by ID  . Chronic asthmatic bronchitis   . Tobacco use    Past Surgical History  Procedure Laterality Date  . Cholecystectomy    . Leg surgery      right leg, s/p accident   Family History  Problem Relation Age of Onset  . Diabetes Sister   . Cancer Sister     breast  . Diabetes Brother   . Hypertension Brother   . Cancer Mother     lung  . Hypertension Mother   . Heart disease Father    Social History  Substance Use Topics  . Smoking status: Current Every Day Smoker -- 2.00 packs/day for 35 years    Types: Cigarettes    Start date: 04/10/1974  . Smokeless tobacco: Never Used     Comment: Previously smoked 2ppd, might start patch  . Alcohol Use: No     Comment: Patient denies    OB History    No data available     Review of Systems  Level 5 caveat because of decreased mental status.   Allergies  Review of patient's allergies indicates no known allergies.  Home Medications    Prior to Admission medications   Medication Sig Start Date End Date Taking? Authorizing Provider  albuterol (PROVENTIL HFA;VENTOLIN HFA) 108 (90 BASE) MCG/ACT inhaler Inhale 2 puffs into the lungs every 6 (six) hours as needed for wheezing or shortness of breath.    Historical Provider, MD  albuterol (PROVENTIL) (2.5 MG/3ML) 0.083% nebulizer solution Take 2.5 mg by nebulization every 6 (six) hours as needed for wheezing or shortness of breath.    Historical Provider, MD  beclomethasone (QVAR) 80 MCG/ACT inhaler Inhale 1 puff into the lungs 2 (two) times daily.    Historical Provider, MD  Darunavir Ethanolate (PREZISTA) 800 MG tablet Take 1 tablet (800 mg total) by mouth daily. 11/19/14   Judyann Munson, MD  divalproex (DEPAKOTE SPRINKLE) 125 MG capsule Take 4 capsules (500 mg total) by mouth at bedtime. 11/23/14   Shari Prows, MD  emtricitabine-tenofovir (TRUVADA) 200-300 MG per tablet Take 1 tablet by mouth daily. 11/19/14   Judyann Munson, MD  gabapentin (NEURONTIN) 400 MG capsule Take 400 mg by mouth at bedtime.     Historical Provider, MD  paliperidone (INVEGA SUSTENNA) 234 MG/1.5ML SUSP injection Inject 234 mg into the muscle once. 12/18/14   Shari Prows, MD  pantoprazole (PROTONIX) 40 MG tablet Take 1 tablet (  40 mg total) by mouth daily. 11/23/14   Shari Prows, MD  raltegravir (ISENTRESS) 400 MG tablet Take 1 tablet (400 mg total) by mouth 2 (two) times daily. 11/19/14   Judyann Munson, MD  ranitidine (ZANTAC) 150 MG tablet Take 150 mg by mouth 2 (two) times daily.    Historical Provider, MD  risperiDONE (RISPERDAL M-TABS) 3 MG disintegrating tablet Take 1 tablet (3 mg total) by mouth 2 (two) times daily. 11/23/14   Shari Prows, MD  ritonavir (NORVIR) 100 MG TABS tablet Take 1 tablet (100 mg total) by mouth daily. 11/19/14   Judyann Munson, MD  traZODone (DESYREL) 50 MG tablet Take 50 mg by mouth at bedtime.    Historical Provider, MD   BP 98/62 mmHg  Pulse 85   Temp(Src) 99 F (37.2 C) (Oral)  Resp 21  Ht  (1.575 m)  Wt 150 lb (68.04 kg)  BMI 27.43 kg/m2  SpO2 93% Physical Exam  Constitutional: She appears well-developed and well-nourished. No distress.  HENT:  Head: Normocephalic and atraumatic.  Eyes: Conjunctivae are normal. Right eye exhibits no discharge. Left eye exhibits no discharge.  Neck: Neck supple.  Cardiovascular: Normal rate, regular rhythm and normal heart sounds.  Exam reveals no gallop and no friction rub.   No murmur heard. Pulmonary/Chest: Effort normal. No respiratory distress. She has wheezes.  Expiratory wheezing b/l  Abdominal: Soft. She exhibits no distension. There is no tenderness.  Musculoskeletal: She exhibits no edema or tenderness.  Scarring/well healed wound to R calf. No calf tenderness. Negative Homan's. No palpable cords.   Neurological:  Pt apparently alert and talking when arrived to ED. On my exam she responds verbally, but very difficult to understand. Had to have her repeat herself several times. Follows simple commands such as squeeze fingers, wiggle toes, etc. No focal motor deficit noted.   Skin: Skin is warm and dry.  Nursing note and vitals reviewed.   ED Course  Procedures (including critical care time) Labs Review Labs Reviewed  CBC - Abnormal; Notable for the following:    RDW 16.2 (*)    All other components within normal limits  BLOOD GAS, ARTERIAL - Abnormal; Notable for the following:    pO2, Arterial 61.3 (*)    Bicarbonate 24.6 (*)    All other components within normal limits  CBG MONITORING, ED - Abnormal; Notable for the following:    Glucose-Capillary 122 (*)    All other components within normal limits  BASIC METABOLIC PANEL  TROPONIN I  BRAIN NATRIURETIC PEPTIDE    Imaging Review Dg Chest Port 1 View  12/17/2014   CLINICAL DATA:  Shortness of breath for 2 days. Wheezing. History of COPD.  EXAM: PORTABLE CHEST - 1 VIEW  COMPARISON:  12/03/2014  FINDINGS:  Cardiomediastinal silhouette is normal. Mediastinal contours appear intact.  There is no evidence of focal airspace consolidation, pleural effusion or pneumothorax. There is coarsening of the interstitial markings. Previously demonstrated calcified few mm pulmonary nodules are again seen.  Osseous structures are without acute abnormality. Soft tissues are grossly normal.  IMPRESSION: Coarsening of the interstitial markings which may represent peribronchial thickening seen with reactive airway disease or bronchitis. Alternatively, developing interstitial pulmonary edema may have a similar appearance.   Electronically Signed   By: Ted Mcalpine M.D.   On: 12/17/2014 14:41   I have personally reviewed and evaluated these images and lab results as part of my medical decision-making.   EKG Interpretation   Date/Time:  Thursday December 17 2014 12:46:36 EDT Ventricular Rate:  101 PR Interval:  166 QRS Duration: 66 QT Interval:  322 QTC Calculation: 417 R Axis:   94 Text Interpretation:  Sinus tachycardia No significant change since last  tracing aside from increased rate Confirmed by Annasophia Crocker  MD, Michiah Masse ([id number])  on 12/17/2014 4:11:17 PM      MDM   Final diagnoses:  SOB (shortness of breath)    54yF with dyspnea. Per review of records, similar presentation last month (waxing/waning mental status, hypoxemia, etc). Pt talking on arrival to ED today. Now very drowsy. Will respond to questioning but speech slurred to point that very difficult to understand. Continues to follow simple commands. pCO2 normal. Wheezing on exam. Hx of COPD. Given nebs. Steroids prehospital. CXR w/o acute abnormality. Afebrile. Normotensive. No reported ingestion but, with unclear presentation, depakote, tylenol, salicylate, ethanol and UDS added. Hyponatremic on previous admit, but not today. Hx of paranoid schizophrenia. Consider psychogenic component. Denies CP. EKG similar to previous. Troponin normal. Needs  supplemental o2 to keep sats in 90s. Does not appear she has baseline requirement. Will discuss with FM for admission.    Raeford Razor, MD 12/17/14 [phone]

## 2014-12-17 NOTE — ED Notes (Signed)
CBG 122, pt not responding to albuterol treatment. Maintaining at 89% on 2.5L O2 nasal cannula. Alerted MD, he's in with patient at this time. Stated he would speak with respiratory and see what the ABG results were.

## 2014-12-17 NOTE — ED Notes (Signed)
With albuterol neb pt still lethargic, will ask about in-out cath for UDS

## 2014-12-17 NOTE — Progress Notes (Signed)
Pt got into room from [facility] ED,pt a bit drowsy but able to arouse,settled in bed with call light at bedside,on oxygen at 3LNC,admitting Doc notified, will however continue to monitor. [name], [name]

## 2014-12-18 ENCOUNTER — Inpatient Hospital Stay (HOSPITAL_COMMUNITY): Payer: Medicaid Other

## 2014-12-18 ENCOUNTER — Encounter (HOSPITAL_COMMUNITY): Payer: Self-pay | Admitting: General Practice

## 2014-12-18 DIAGNOSIS — R4182 Altered mental status, unspecified: Secondary | ICD-10-CM

## 2014-12-18 DIAGNOSIS — R404 Transient alteration of awareness: Secondary | ICD-10-CM

## 2014-12-18 MED ORDER — PREDNISONE 20 MG PO TABS
25.0000 mg | ORAL_TABLET | Freq: Every day | ORAL | Status: AC
  Administered 2014-12-19 – 2014-12-21 (×3): 25 mg via ORAL
  Filled 2014-12-18 (×6): qty 1

## 2014-12-18 MED ORDER — NICOTINE 21 MG/24HR TD PT24
21.0000 mg | MEDICATED_PATCH | Freq: Every day | TRANSDERMAL | Status: DC
  Administered 2014-12-18 – 2014-12-21 (×4): 21 mg via TRANSDERMAL
  Filled 2014-12-18 (×4): qty 1

## 2014-12-18 NOTE — Progress Notes (Signed)
Family Medicine Teaching Service Daily Progress Note Intern Pager: [phone]  Patient name: [name] Medical record number: [id number] Date of birth: 1960-07-15 Age: 54 y.o. Gender: female  Primary Care Provider: Levert Feinstein, MD Consultants: Pshychiatry Code Status: Full  Pt Overview and Major Events to Date:  9/8 Admitted for AMS  Assessment and Plan:  [name] is a 54 y.o. female presenting with altered mental status and hypoxia. PMH is significant for COPD, schizophrenia, HIV, hep B, and tobacco abuse.   Acute Encephalopathy. Improving this AM, likely secondary to psychiatric cause with potential contribution by marijuana use given + marijuana on UDS.   - CT head neg - Anticipate improvement with resolution of hypoxia - Hold all sedating medications until mental status improves - Consider EEG if not improving - Psych consulted regarding further treatment recs  Hypoxia secondary to acute COPD exacerbation.  - Supplemental oxygen as needed to keep sats >88% - Continue pulmicort bid - Duoneb q4hrs / albuterol q2hrs prn - s/p solumedrol via EMS - Prednisone (9/9- ) - Azithromycin (9/8 - ) - on qvar at home and given strong history of COPD, would benefit from combined long acting beta agonist + steroid such as symbicort - consider d/c of home qvar and instead add symbicort on discharge  Schizophrenia. Patient with recent history of homicidal ideation. [name] psychosis may be contributing to altered mental status.  - Hold home seroquel, trazodone, gabapentin, and ambien until mental status improve - Continue home depakote.  HIV / Hep B - Truvada, Prezista, raltegravir, ritonavir  Tobacco Abuse - Will supply nicotine patch upon request  FEN/GI: NPO, D5 1/2NS /hr Prophylaxis: SubQ heparin   Subjective:  Pt now alert after sternal rub. Denies chest pain and reports shortness of breath improving with oxygen  Objective: Temp:  [98.3 F (36.8 C)-99 F  (37.2 C)] 98.4 F (36.9 C) (09/09 0523) Pulse Rate:  [74-105] 74 (09/09 0523) Resp:  [17-30] 20 (09/09 0523) BP: (98-116)/(31-73) 101/73 mmHg (09/09 0523) SpO2:  [84 %-98 %] 96 % (09/09 0859) Weight:  [150 lb (68.04 kg)-150 lb 9.2 oz (68.3 kg)] 150 lb 9.2 oz (68.3 kg) (09/08 2031) Physical Exam: General: Somnolent, but awakens to sternal rub, NAD Cardiovascular: tachycardic, no murmurs Respiratory: diffuse expiratory >inspiratory wheezing, no retractions, speaking in full sentences, normal WOB Abdomen: soft nontender, + BS Extremities: left leg scarring, no LE edema, withdraws legs to touch  Laboratory:  Recent Labs Lab 12/17/14 1421  WBC 7.3  HGB 12.5  HCT 37.6  PLT 230    Recent Labs Lab 12/17/14 1421  NA 136  K 4.7  CL 101  CO2 26  BUN 13  CREATININE 1.03*  CALCIUM 8.8*  GLUCOSE 118*      Imaging/Diagnostic Tests: CT head IMPRESSION: Chronic atrophic and ischemic changes without acute abnormality.  CXR IMPRESSION: Coarsening of the interstitial markings which may represent peribronchial thickening seen with reactive airway disease or bronchitis. Alternatively, developing interstitial pulmonary edema may have a similar appearance.    Bonney Aid, MD 12/18/2014, 9:51 AM PGY-2, [facility] FPTS Intern pager: [phone], text pages welcome

## 2014-12-18 NOTE — Care Management Note (Signed)
Case Management Note  Patient Details  Name: ABELLA SHUGART MRN: [id number] Date of Birth: 22-May-1960  Subjective/Objective:                 Patient admitted from home, lives with brother whom she states drives her to her appointments. She reports she is followed by the infectious disease clinic and is part of the [facility] program to receive her AIDS medications. Patient is a readmit, discharged two weeks ago. Admitted with hypoxic event, is not oxygen dependant at home. Patient states she does not receive any home health.   Action/Plan:  CM seeking HRI approval, not a candidate for [facility].   Expected Discharge Date:                  Expected Discharge Plan:  Home w Home Health Services  In-House Referral:     Discharge planning Services  CM Consult  Post Acute Care Choice:    Choice offered to:     DME Arranged:    DME Agency:     HH Arranged:    HH Agency:     Status of Service:  In process, will continue to follow  Medicare Important Message Given:    Date Medicare IM Given:    Medicare IM give by:    Date Additional Medicare IM Given:    Additional Medicare Important Message give by:     If discussed at Long Length of Stay Meetings, dates discussed:    Additional Comments:  Lawerance Sabal, RN 12/18/2014, 12:39 PM

## 2014-12-18 NOTE — Progress Notes (Addendum)
Awaiting for approval for [facility]RI. [facility] assigned this week. Message sent to [facility] Suezanne Cheshire [phone] RN  to watch for discharge over the weekend.

## 2014-12-19 DIAGNOSIS — F203 Undifferentiated schizophrenia: Secondary | ICD-10-CM

## 2014-12-19 LAB — C DIFFICILE QUICK SCREEN W PCR REFLEX
C DIFFICLE (CDIFF) ANTIGEN: NEGATIVE
C Diff interpretation: NEGATIVE
C Diff toxin: NEGATIVE

## 2014-12-19 MED ORDER — RISPERIDONE 2 MG PO TBDP
3.0000 mg | ORAL_TABLET | Freq: Two times a day (BID) | ORAL | Status: DC
  Administered 2014-12-19 – 2014-12-21 (×4): 3 mg via ORAL
  Filled 2014-12-19 (×6): qty 1

## 2014-12-19 MED ORDER — FAMOTIDINE 20 MG PO TABS
20.0000 mg | ORAL_TABLET | Freq: Two times a day (BID) | ORAL | Status: DC
  Administered 2014-12-19 – 2014-12-21 (×4): 20 mg via ORAL
  Filled 2014-12-19 (×5): qty 1

## 2014-12-19 MED ORDER — ONDANSETRON HCL 4 MG/2ML IJ SOLN
4.0000 mg | Freq: Four times a day (QID) | INTRAMUSCULAR | Status: DC | PRN
  Administered 2014-12-19 – 2014-12-20 (×2): 4 mg via INTRAVENOUS
  Filled 2014-12-19 (×2): qty 2

## 2014-12-19 MED ORDER — IPRATROPIUM-ALBUTEROL 0.5-2.5 (3) MG/3ML IN SOLN
3.0000 mL | Freq: Four times a day (QID) | RESPIRATORY_TRACT | Status: DC
  Administered 2014-12-19 – 2014-12-20 (×5): 3 mL via RESPIRATORY_TRACT
  Filled 2014-12-19 (×9): qty 3

## 2014-12-19 MED ORDER — BUDESONIDE-FORMOTEROL FUMARATE 80-4.5 MCG/ACT IN AERO: 2.0000 | INHALATION_SPRAY | Freq: Two times a day (BID) | RESPIRATORY_TRACT | Status: DC

## 2014-12-19 NOTE — Progress Notes (Signed)
Family Medicine Teaching Service Daily Progress Note Intern Pager: [phone]  Patient name: [name] Medical record number: [id number] Date of birth: 1960-08-25 Age: 54 y.o. Gender: female  Primary Care Provider: Levert Feinstein, MD Consultants: Pshychiatry Code Status: Full  Pt Overview and Major Events to Date:  9/8 Admitted for AMS  Assessment and Plan:  [name] is a 54 y.o. female presenting with altered mental status and hypoxia. PMH is significant for COPD, schizophrenia, HIV, hep B, and tobacco abuse.  Acute Encephalopathy: secondary to psychiatric cause with potential contribution by marijuana use given + marijuana on UDS versus hypoxia from acute COPD exacerbation. CT head neg. Resolved this morning.  - Consider EEG if not improving - Psych consult regarding further treatment recs pending  Nausea/Vomiting, Diarrhea: C Diff negative. Possibly acute gastroenteritis. - NPO, advance diet as tolerated - Continue to monitor  Hypoxia secondary to acute COPD exacerbation:  - Supplemental oxygen as needed to keep sats >88% - Continue pulmicort bid - Duoneb q4hrs / albuterol q2hrs prn - s/p solumedrol via EMS - Prednisone  daily to complete 9/12 - Azithromycin  daily to complete 5 day course of antibiotics 9/12 - consider d/c of home qvar and instead add symbicort on discharge  Schizophrenia: Patient with recent history of homicidal ideation. 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  - Hold home seroquel, trazodone, gabapentin, and ambien - Continue home depakote.  HIV / Hep B - Truvada, Prezista, raltegravir, ritonavir  Tobacco Abuse - Will supply nicotine patch upon request  FEN/GI: NPO, D5 1/2NS /hr Prophylaxis: SubQ heparin   Subjective:  No acute events overnight. Doing well overall. Oriented x 4. She reports her breathing has improved. Denies chest pain. She had 3 episodes of N/V this morning and 4 loose bowel  movements per RN.   Objective: Temp:  [97.6 F (36.4 C)-98.4 F (36.9 C)] 98.2 F (36.8 C) (09/10 0532) Pulse Rate:  [69-80] 79 (09/10 0532) Resp:  [18-20] 18 (09/10 0532) BP: (94-103)/(52-61) 98/58 mmHg (09/10 0532) SpO2:  [86 %-97 %] 96 % (09/10 0532) Physical Exam: General: NAD Cardiovascular: RRR, no murmurs Respiratory: Some course breath sounds, but otherwise clear bilaterally. No wheezes. Abdomen: soft, nontender, + BS Extremities: left leg scarring, no LE edema  Laboratory:  Recent Labs Lab 12/17/14 1421  WBC 7.3  HGB 12.5  HCT 37.6  PLT 230    Recent Labs Lab 12/17/14 1421  NA 136  K 4.7  CL 101  CO2 26  BUN 13  CREATININE 1.03*  CALCIUM 8.8*  GLUCOSE 118*   Imaging/Diagnostic Tests: CT head IMPRESSION: Chronic atrophic and ischemic changes without acute abnormality.  CXR IMPRESSION: Coarsening of the interstitial markings which may represent peribronchial thickening seen with reactive airway disease or bronchitis. Alternatively, developing interstitial pulmonary edema may have a similar appearance.  [name], MD 12/19/2014, 1:38 PM PGY-2,  Family Medicine FPTS Intern pager: [phone], text pages welcome

## 2014-12-19 NOTE — Consult Note (Signed)
[facility] Face-to-Face Psychiatry Consult   Reason for Consult:  Schizoaffective disorder and AMS Referring Physician:  Dr. Jerrye Beavers Patient Identification: [name] MRN:  [id number] Principal Diagnosis: Altered mental status Diagnosis:   Patient Active Problem List   Diagnosis Date Noted  . Altered mental status [R41.82]   . Hypoxemia [R09.02] 12/17/2014  . Acute respiratory distress [J80]   . COPD exacerbation [J44.1]   . Encounter for preadmission testing [Z01.818]   . Diabetes [E11.9] 12/01/2014  . COLD (chronic obstructive lung disease) [J44.9]   . Homicidal ideation [R45.850]   . Hypoxia [R09.02]   . Lethargy [R53.83]   . Wheezing [R06.2]   . 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 Asthma, chronic [J45.909]   . HIV disease [B20]   . 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 Paranoid schizophrenia [F20.0]   . Psychosis [F29] 10/30/2014  . Psychoses [F29]   . Hyponatremia [E87.1] 10/22/2014  . Microscopic hematuria [R31.2] 01/29/2014  . Abdominal pain [R10.9] 12/12/2013  . Vulvar irritation [N90.89] 12/12/2013  . COPD (chronic obstructive pulmonary disease) [J44.9] 02/18/2013  . Tobacco use disorder [Z72.0] 02/18/2013  . Hepatitis B infection [B16.9]   . Pre-ulcerative corn or callous [L84] 05/07/2012  . Urinary incontinence, nocturnal enuresis [N39.44] 05/07/2012  . Fecal incontinence [R15.9] 05/07/2012  . Systolic murmur [R01.1] 05/07/2012  . Routine health maintenance [Z00.00] 05/07/2012  . Asthma [493] 08/18/2010  . HIV (human immunodeficiency virus infection) [Z21] 08/18/2010  . Schizophrenia [F20.9] 08/18/2010  . Herpes simplex infection [B00.9] 08/18/2010    Total Time spent with patient: 45 minutes  Subjective:   [name] is a 54 y.o. female patient admitted with AMS and SOB  HPI:  [name] is a 54 y.o. female seen face-to-face for psychiatric consultation and evaluation of altered mental status and shortness of breath. Patient  has a history of schizoaffective disorder and was recently admitted to [facility] for similar clinical condition. Patient is known to this provider from her previous [facility] encounter with this similar condition and episode of seizure. Patient was argumentative at that time and requested premature discharge from the hospital mostly she is craving for nicotine. Patient reported she has been staying with her brother Eddie Candle who was not able to reach that this time. Patient has limited insight and judgment. Patient is asking to be discharged home when she was not able to stable on her feet and having hard time to breathe. Patient reportedly requesting to smoke cigarette before coming to the hospital when she has shortness of breath. Patient reported her nebulizer helped her first time but not second time after smoking tobacco. Patient was discharged from the [facility] with the [facility] sustain a 234 mg scheduled to give 12/18/2014, risperidone T- tabs 3 mg 2 times a day, benztropine 0.5 m eyes daily, Depakote 500 mg at bedtime, gabapentin 400 mg twice daily and Ambien 5 mg as needed. Patient reported she does not remember which hospital she was admitted during the last month and also does not recall the names of medications is supposed to take which indicates patient may be confused with medication administration. Patient valproic acid level is 54 which is low therapeutic level. Patient urine drug screen is positive for tetrahydrocannabinol.  PMH is significant for COPD, schizophrenia, HIV, hep B, and tobacco abuse  HPI Elements:   Location:  Schizophrenia. Quality:  Fair to poor. Severity:  Mild-to-moderate. Timing:  Questionable compliant with medication. Duration:  Multiple  psychiatric hospitalization. Context:  Psychosocial stresses questionable compliant with medication management.  Past Medical History:  Past Medical History  Diagnosis Date  . GERD (gastroesophageal  reflux disease)   . Schizophrenia   . AIDS 12-10-2007  . Herpes simplex   . History of cholecystectomy   . Cataract   . Nonspecific reaction to tuberculin skin test without active tuberculosis 11-2008 [facility]   . Seizures   . Asthmatic bronchitis , chronic   . Hepatitis B infection     followed by ID  . Chronic asthmatic bronchitis   . Tobacco use     Past Surgical History  Procedure Laterality Date  . Cholecystectomy    . Leg surgery Right     s/p accident; "steel fell on the back of my leg"  . Tonsillectomy     Family History:  Family History  Problem Relation Age of Onset  . Diabetes Sister   . Cancer Sister     breast  . Diabetes Brother   . Hypertension Brother   . Cancer Mother     lung  . Hypertension Mother   . Heart disease Father    Social History:  History  Alcohol Use No    Comment: Patient denies      History  Drug Use No    Comment: Patient denies    Social History   Social History  . Marital Status: Single    Spouse Name: N/A  . Number of Children: N/A  . Years of Education: N/A   Social History Main Topics  . Smoking status: Current Every Day Smoker -- 2.00 packs/day for 35 years    Types: Cigarettes    Start date: 04/10/1974  . Smokeless tobacco: Never Used     Comment: Previously smoked 2ppd, might start patch  . Alcohol Use: No     Comment: Patient denies   . Drug Use: No     Comment: Patient denies  . Sexual Activity: Not Currently     Comment: pt. declined condoms   Other Topics Concern  . None   Social History Narrative   The patient says she was born and raised in [place]. She says she currently [place] with her brother. She says she has 1 son and is currently divorced. She was a very poor historian and unable to give any further social history.   Additional Social History:                          Allergies:  No Known Allergies  Labs:  Results for orders placed or performed during the hospital  encounter of 12/17/14 (from the past 48 hour(s))  Urine rapid drug screen (hosp performed)     Status: Abnormal   Collection Time: 12/17/14  4:14 PM  Result Value Ref Range   Opiates NONE DETECTED NONE DETECTED   Cocaine NONE DETECTED NONE DETECTED   Benzodiazepines NONE DETECTED NONE DETECTED   Amphetamines NONE DETECTED NONE DETECTED   Tetrahydrocannabinol POSITIVE (A) NONE DETECTED   Barbiturates NONE DETECTED NONE DETECTED    Comment:        DRUG SCREEN FOR MEDICAL PURPOSES ONLY.  IF CONFIRMATION IS NEEDED FOR ANY PURPOSE, NOTIFY LAB WITHIN 5 DAYS.        LOWEST DETECTABLE LIMITS FOR URINE DRUG SCREEN Drug Class       Cutoff (ng/mL) Amphetamine      1000 Barbiturate  200 Benzodiazepine   200 Tricyclics       300 Opiates          300 Cocaine          300 THC              50   C difficile quick scan w PCR reflex     Status: None   Collection Time: 12/19/14 11:29 AM  Result Value Ref Range   C Diff antigen NEGATIVE NEGATIVE   C Diff toxin NEGATIVE NEGATIVE   C Diff interpretation Negative for toxigenic C. difficile     Vitals: Blood pressure 121/77, pulse 88, temperature 99.3 F (37.4 C), temperature source Oral, resp. rate 20, height 4\' 11"  (1.499 m), weight 68.3 kg (150 lb 9.2 oz), SpO2 96 %.  Risk to Self: Is patient at risk for suicide?: No Risk to Others:   Prior Inpatient Therapy:   Prior Outpatient Therapy:    Current Facility-Administered Medications  Medication Dose Route Frequency Provider Last Rate Last Dose  . albuterol (PROVENTIL) (2.5 MG/3ML) 0.083% nebulizer solution 2.5 mg  2.5 mg Nebulization Q2H PRN Ardith Dark, MD      . azithromycin [facility]) tablet 250 mg  250 mg Oral Daily Ardith Dark, MD   250 mg at 12/19/14 0939  . Darunavir Ethanolate (PREZISTA) tablet 800 mg  800 mg Oral Daily Ardith Dark, MD   800 mg at 12/19/14 0939  . dextrose 5 %-0.45 % sodium chloride infusion   Intravenous Continuous Ardith Dark, MD 75 mL/hr at  12/19/14 0240    . divalproex (DEPAKOTE SPRINKLE) capsule 500 mg  500 mg Oral QHS Ardith Dark, MD   500 mg at 12/18/14 2142  . emtricitabine-tenofovir (TRUVADA) 200-300 MG per tablet 1 tablet  1 tablet Oral Daily Ardith Dark, MD   1 tablet at 12/19/14 4098  . famotidine (PEPCID) tablet 20 mg  20 mg Oral BID [name], MD      . heparin injection 5,000 Units  5,000 Units Subcutaneous 3 times per day Ardith Dark, MD   5,000 Units at 12/19/14 1423  . ipratropium-albuterol (DUONEB) 0.5-2.5 (3) MG/3ML nebulizer solution 3 mL  3 mL Nebulization QID Leighton Roach McDiarmid, MD   3 mL at 12/19/14 1210  . nicotine (NICODERM CQ - dosed in mg/24 hours) patch 21 mg  21 mg Transdermal Daily Joanna Puff, MD   21 mg at 12/19/14 0942  . ondansetron (ZOFRAN) injection 4 mg  4 mg Intravenous Q6H PRN [name], MD   4 mg at 12/19/14 1423  . predniSONE (DELTASONE) tablet 25 mg  25 mg Oral Q breakfast Palma Holter, MD   25 mg at 12/19/14 0939  . raltegravir (ISENTRESS) tablet 400 mg  400 mg Oral BID Ardith Dark, MD   400 mg at 12/18/14 2142  . risperiDONE (RISPERDAL M-TABS) disintegrating tablet 3 mg  3 mg Oral BID [name], MD      . ritonavir (NORVIR) tablet 100 mg  100 mg Oral Q breakfast Ardith Dark, MD   100 mg at 12/18/14 2216    Musculoskeletal: Strength & Muscle Tone: decreased Gait & Station: unsteady Patient leans: Front  Psychiatric Specialty Exam: Physical Exam as per history and physical   ROS shortness of breath, unsteady feet, reportedly had nausea and vomiting and denied chest pain No Fever-chills, No Headache, No changes with Vision or hearing, reports vertigo No problems  swallowing food or Liquids, No Chest pain, Cough or Shortness of Breath, No Abdominal pain, No Nausea or Vommitting, Bowel movements are regular, No Blood in stool or Urine, No dysuria, No new skin rashes or bruises, No new joints pains-aches,  No new weakness, tingling, numbness  in any extremity, No recent weight gain or loss, No polyuria, polydypsia or polyphagia,   A full 10 point Review of Systems was done, except as stated above, all other Review of Systems were negative.   Blood pressure 121/77, pulse 88, temperature 99.3 F (37.4 C), temperature source Oral, resp. rate 20, height 4\' 11"  (1.499 m), weight 68.3 kg (150 lb 9.2 oz), SpO2 96 %.Body mass index is 30.4 kg/(m^2).  General Appearance: Bizarre, Disheveled and Guarded  Eye Contact::  Good  Speech:  Pressured and Slurred  Volume:  Decreased  Mood:  Anxious, Depressed and Irritable  Affect:  Labile  Thought Process:  Disorganized and Irrelevant  Orientation:  Full (Time, Place, and Person)  Thought Content:  Rumination  Suicidal Thoughts:  No  Homicidal Thoughts:  No  Memory:  Immediate;   Fair Recent;   Poor  Judgement:  Impaired  Insight:  Lacking  Psychomotor Activity:  Decreased  Concentration:  Poor  Recall:  Fair  Fund of Knowledge:Fair  Language: Good  Akathisia:  Negative  Handed:  Right  AIMS (if indicated):     Assets:  Communication Skills Desire for Improvement Financial Resources/Insurance Housing Leisure Time Physical Health Resilience Social Support Transportation  ADL's:  Intact  Cognition: WNL  Sleep:      Medical Decision Making: Review of Psycho-Social Stressors (1), Review or order clinical lab tests (1), Established Problem, Worsening (2), Review of Last Therapy Session (1), Review or order medicine tests (1), Review of Medication Regimen & Side Effects (2) and Review of New Medication or Change in Dosage (2)  Treatment Plan Summary: Daily contact with patient to assess and evaluate symptoms and progress in treatment and Medication management  Plan:  Patient has no safety concerns at this time. Continue risperidone M tablets 3 mg twice daily for mood swings and psychosis Continue Depakote 500 mg at bedtime for mood swings, and may monitor for the valproic  acid levels in 3-5 days for therapeutic benefits Nicoderm CQ 21 mg daily for an according cravings Patient does not meet criteria for psychiatric inpatient admission. Supportive therapy provided about ongoing stressors. Appreciate psychiatric consultation and follow up as clinically required Please contact [phone] or [phone] if needs further assistance  Disposition: Patient will be referred to the outpatient medication management and medically stable.  Jenean Escandon,JANARDHAHA R. 12/19/2014 4:04 PM

## 2014-12-20 MED ORDER — PREDNISONE 5 MG PO TABS: 25.0000 mg | ORAL_TABLET | Freq: Every day | ORAL | Status: DC

## 2014-12-20 MED ORDER — DOXYCYCLINE HYCLATE 100 MG PO TABS
100.0000 mg | ORAL_TABLET | Freq: Two times a day (BID) | ORAL | Status: DC
  Administered 2014-12-20 – 2014-12-21 (×2): 100 mg via ORAL
  Filled 2014-12-20 (×2): qty 1

## 2014-12-20 MED ORDER — DOXYCYCLINE HYCLATE 100 MG PO TABS: 100.0000 mg | ORAL_TABLET | Freq: Two times a day (BID) | ORAL | Status: DC

## 2014-12-20 MED ORDER — AZITHROMYCIN 250 MG PO TABS: 250.0000 mg | ORAL_TABLET | Freq: Every day | ORAL | Status: DC

## 2014-12-20 MED ORDER — LOPERAMIDE HCL 2 MG PO CAPS
4.0000 mg | ORAL_CAPSULE | Freq: Once | ORAL | Status: AC
  Administered 2014-12-20: 4 mg via ORAL
  Filled 2014-12-20: qty 2

## 2014-12-20 NOTE — Progress Notes (Signed)
Patient still having loose stools with some mucous noted in them by RN. She is incontinent of stool in her bed. No blood. Tolerating diet without nausea or vomiting.  She remains afebrile.  Plan: Diarrhea: History of fecal incontinence noted in 2014. C Diff negative. HIV controlled on last check in April. Likely caused by azithromycin as this can be a side effect.  -Imodium  x 1. -If diarrhea fails to improve, will need further workup. -Continue to monitor for now. -If improved, will consider discharge to home this evening.  Griffin Basil, PGY2 Covering for [facility] Medicine Teaching Service

## 2014-12-20 NOTE — Progress Notes (Signed)
Patient complaining of nausea/vomiting. MD notified and per PRN order, pt received Zofran IV. Will continue to monitor.

## 2014-12-20 NOTE — Progress Notes (Signed)
Family Medicine Teaching Service Daily Progress Note Intern Pager: [phone]  Patient name: [name] Medical record number: [id number] Date of birth: Oct 01, 1960 Age: 54 y.o. Gender: female  Primary Care Provider: Levert Feinstein, MD Consultants: Pshychiatry Code Status: Full  Pt Overview and Major Events to Date:  9/8 Admitted for AMS  Assessment and Plan:  [name] is a 54 y.o. female presenting with altered mental status and hypoxia. PMH is significant for COPD, schizophrenia, HIV, hep B, and tobacco abuse.  Acute Encephalopathy: secondary to psychiatric cause with potential contribution by marijuana use given + marijuana on UDS versus hypoxia from acute COPD exacerbation. CT head neg. Resolved this morning. Evaluated by psychiatry - no safety concerns, continue risperdone M tablets  BID and depakote  QHS. May monitor valproic acid levels in 3-5 days.  Nausea/Vomiting, Diarrhea: C Diff negative. May have been related to risperdone withdrawal. Resolved since risperdone restarted.  Hypoxia secondary to acute COPD exacerbation:  - Supplemental oxygen as needed to keep sats >88% - Continue pulmicort bid - Duoneb q4hrs / albuterol q2hrs prn - s/p solumedrol via EMS - Prednisone  daily to complete 9/12 - Azithromycin  daily to complete 5 day course of antibiotics 9/12 - d/c home qvar and started on symbicort on discharge  Schizophrenia: Patient with recent history of homicidal ideation. 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  - Continue home depakote and risperdone  HIV / Hep B - Truvada, Prezista, raltegravir, ritonavir  Tobacco Abuse - Nicotine patch  FEN/GI: Regular diet Prophylaxis: SubQ heparin   Subjective:  No acute events overnight. Doing well overall. Oriented x 4. She reports her breathing has improved. Denies chest pain. Denies nausea and vomiting and tolerating a full liquid diet for breakfast.  Objective: Temp:   [98.8 F (37.1 C)-99.3 F (37.4 C)] 98.8 F (37.1 C) (09/11 0519) Pulse Rate:  [75-88] 75 (09/11 0519) Resp:  [18-20] 18 (09/11 0519) BP: (113-121)/(69-77) 113/69 mmHg (09/11 0519) SpO2:  [96 %-98 %] 96 % (09/11 0519) Physical Exam: General: NAD Cardiovascular: RRR, no murmurs Respiratory: Some course breath sounds, but otherwise clear bilaterally. No wheezes. Abdomen: soft, nontender, + BS Extremities: left leg scarring, no LE edema  Laboratory:  Recent Labs Lab 12/17/14 1421  WBC 7.3  HGB 12.5  HCT 37.6  PLT 230    Recent Labs Lab 12/17/14 1421  NA 136  K 4.7  CL 101  CO2 26  BUN 13  CREATININE 1.03*  CALCIUM 8.8*  GLUCOSE 118*   Imaging/Diagnostic Tests: CT head IMPRESSION: Chronic atrophic and ischemic changes without acute abnormality.  CXR IMPRESSION: Coarsening of the interstitial markings which may represent peribronchial thickening seen with reactive airway disease or bronchitis. Alternatively, developing interstitial pulmonary edema may have a similar appearance.  [name], MD 12/20/2014, 1:19 PM PGY-2, [place] [facility] FPTS Intern pager: [phone], text pages welcome

## 2014-12-20 NOTE — Discharge Summary (Signed)
Family Medicine Teaching [facility] Discharge Summary  Patient name: [name] Medical record number: [id number] Date of birth: 12-Sep-1960 Age: 54 y.o. Gender: female Date of Admission: 12/17/2014  Date of Discharge: 12/21/2014  Admitting Physician: Moses Manners, MD  Primary Care Provider: Levert Feinstein, MD Consultants: None  Indication for Hospitalization: COPD exacerbation Somnolence  Discharge Diagnoses/Problem List:  Patient Active Problem List   Diagnosis Date Noted  . Altered mental status   . Hypoxemia 12/17/2014  . Acute respiratory distress   . COPD exacerbation   . Encounter for preadmission testing   . Diabetes 12/01/2014  . COLD (chronic obstructive lung disease)   . Homicidal ideation   . Hypoxia   . Lethargy   . Wheezing   . Delusional disorder   . Asthma, chronic   . HIV disease   . 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 Paranoid schizophrenia   . Psychosis 10/30/2014  . Psychoses   . Hyponatremia 10/22/2014  . Microscopic hematuria 01/29/2014  . Abdominal pain 12/12/2013  . Vulvar irritation 12/12/2013  . COPD (chronic obstructive pulmonary disease) 02/18/2013  . Tobacco use disorder 02/18/2013  . Hepatitis B infection   . Pre-ulcerative corn or callous 05/07/2012  . Urinary incontinence, nocturnal enuresis 05/07/2012  . Fecal incontinence 05/07/2012  . Systolic murmur 05/07/2012  . Routine health maintenance 05/07/2012  . Asthma 08/18/2010  . HIV (human immunodeficiency virus infection) 08/18/2010  . Schizophrenia 08/18/2010  . Herpes simplex infection 08/18/2010     Disposition: Home  Discharge Condition: Stable  Discharge Exam:   Objective: Temp: [97.9 F (36.6 C)-99.1 F (37.3 C)] 98.9 F (37.2 C) (09/12 1314) Pulse Rate: [31-77] 70 (09/12 1314) Resp: [18] 18 (09/12 1314) BP: (92-147)/(60-77) 111/69 mmHg (09/12 1314) SpO2: [78 %-99 %] 91 % (09/12 1314) Physical Exam: General:NAD, lying in  bed Cardiovascular:  Respiratory: diffuse mild wheezes, normal WOB Abdomen: soft nontender, + BS Extremities: left leg scarring, no LE edema, non tender  Brief Hospital Course:   [name] is a 54 y.o. female presenting with altered mental status and hypoxia consistent with a COPD exacerbation. PMH is significant for COPD, schizophrenia, HIV, hep B, and tobacco abuse.   For her altered mental status admission testing was significant for a UDS positive for marijuana. She was maintained on her home Riperdone and Depakote. Psychiatry was consulted and agreed with this management. As her mental status had improved by that time they felt she was safe to discharge to home with close follow up with behavioral health.  For her COPD exacerbation she was maintained on azithromycin then transitioned to doxycycline to complete a 5 day course. She was also given solumedrol x1 in the ED, them transitioned to oral prednisone to complete a 5 day course. She was also noted to take Qvar at home. As she has not had PFTS and has a significant smoking history with presentations consistent for COPD, she was counseled to stop Qvar and start taking symbicort as an outpatient. Her home HIV medications were maintained without issue  Issues for Follow Up:  1. COPD control and adherence to home Symbicort 2. Mood stability  Significant Procedures:   None  Significant Labs and Imaging:   Recent Labs Lab 12/17/14 1421  WBC 7.3  HGB 12.5  HCT 37.6  PLT 230    Recent Labs Lab 12/17/14 1421  NA 136  K 4.7  CL 101  CO2 26  GLUCOSE 118*  BUN 13  CREATININE 1.03*  CALCIUM 8.8*    Results/Tests Pending at Time of Discharge:  None  Discharge Medications:    Medication List    STOP taking these medications        beclomethasone 80 MCG/ACT inhaler  Commonly known as:  QVAR     pantoprazole 40 MG tablet  Commonly known as:  PROTONIX     QUEtiapine 50 MG tablet  Commonly known as:  SEROQUEL      zolpidem 10 MG tablet  Commonly known as:  AMBIEN      TAKE these medications        budesonide-formoterol 80-4.5 MCG/ACT inhaler  Commonly known as:  SYMBICORT  Inhale 2 puffs into the lungs 2 (two) times daily.     Darunavir Ethanolate 800 MG tablet  Commonly known as:  PREZISTA  Take 1 tablet (800 mg total) by mouth daily.     divalproex 125 MG capsule  Commonly known as:  DEPAKOTE SPRINKLE  Take 4 capsules (500 mg total) by mouth at bedtime.     doxycycline 100 MG tablet  Commonly known as:  VIBRA-TABS  Take 1 tablet (100 mg total) by mouth every 12 (twelve) hours.     emtricitabine-tenofovir 200-300 MG per tablet  Commonly known as:  TRUVADA  Take 1 tablet by mouth daily.     paliperidone 234 MG/1.5ML Susp injection  Commonly known as:  INVEGA SUSTENNA  Inject 234 mg into the muscle once.     polyethylene glycol packet  Commonly known as:  MIRALAX / GLYCOLAX  Take 17 g by mouth daily as needed for mild constipation.     predniSONE 5 MG tablet  Commonly known as:  DELTASONE  Take 5 tablets (25 mg total) by mouth daily with breakfast.     raltegravir 400 MG tablet  Commonly known as:  ISENTRESS  Take 1 tablet (400 mg total) by mouth 2 (two) times daily.     ranitidine 150 MG tablet  Commonly known as:  ZANTAC  Take 150 mg by mouth 2 (two) times daily.     risperidone 3 MG disintegrating tablet  Commonly known as:  RISPERDAL M-TABS  Take 1 tablet (3 mg total) by mouth 2 (two) times daily.     risperiDONE microspheres 50 MG injection  Commonly known as:  RISPERDAL CONSTA  Inject 50 mg into the muscle every 30 (thirty) days.     ritonavir 100 MG Tabs tablet  Commonly known as:  NORVIR  Take 1 tablet (100 mg total) by mouth daily.        Discharge Instructions: Please refer to Patient Instructions section of EMR for full details.  Patient was counseled important signs and symptoms that should prompt return to medical care, changes in medications,  dietary instructions, activity restrictions, and follow up appointments.   Follow-Up Appointments: Follow-up Information    Follow up with Jackie Doe, MD On 01/01/2015.   Specialty:  Family Medicine   Why:  10:00 AM   Contact information:   [address] [place] [address] [phone]       Cicily Bonano A. Kennon Rounds MD, MS Family Medicine Resident PGY-2 Pager [phone]

## 2014-12-21 DIAGNOSIS — R400 Somnolence: Secondary | ICD-10-CM

## 2014-12-21 MED ORDER — DOXYCYCLINE HYCLATE 100 MG PO TABS: 100.0000 mg | ORAL_TABLET | Freq: Two times a day (BID) | ORAL | Status: DC

## 2014-12-21 MED ORDER — PREDNISONE 5 MG PO TABS: 25.0000 mg | ORAL_TABLET | Freq: Every day | ORAL | Status: DC

## 2014-12-21 MED ORDER — PNEUMOCOCCAL VAC POLYVALENT 25 MCG/0.5ML IJ INJ
0.5000 mL | INJECTION | INTRAMUSCULAR | Status: AC
  Filled 2014-12-21: qty 0.5

## 2014-12-21 MED ORDER — INFLUENZA VAC SPLIT QUAD 0.5 ML IM SUSY
0.5000 mL | PREFILLED_SYRINGE | INTRAMUSCULAR | Status: AC
  Filled 2014-12-21: qty 0.5

## 2014-12-21 NOTE — Progress Notes (Addendum)
NURSING PROGRESS NOTE  SUNSHINE MACKOWSKI [id number] Discharge Data: 12/21/2014 4:32 PM Attending Provider: No att. providers found WUJ:[id number] Pollie Meyer, MD   [name] to be D/C'd Home per MD order with brother. All prescriptions sent to patient preferred pharmacy. Discharge teaching done with brother present as well as pharmacy student whom did enhanced medication teaching. Patient taken out via wheelchair by NA Danne Harbor with brother in self-motorized scooter. Flu and pneumonia vaccines administered per MD Haney.    All IV's will be discontinued and monitored for bleeding.  All belongings will be returned to patient for patient to take home.  Last Documented Vital Signs:  Blood pressure 111/69, pulse 70, temperature 98.9 F (37.2 C), temperature source Oral, resp. rate 18, height  (1.499 m), weight 68.3 kg (150 lb 9.2 oz), SpO2 91 %.  Leane Platt RN, BS, BSN

## 2014-12-21 NOTE — Discharge Instructions (Signed)
°  Please follow with the family medicine office for your COPD exacerbation  Please also follow with [facility] as scheduled  Follow-up Information    Follow up with Jacquiline Doe, MD On 01/01/2015.   Specialty:  Family Medicine   Why:  10:00 AM   Contact information:   [address] [place] [address] [phone]

## 2014-12-21 NOTE — Progress Notes (Signed)
Family Medicine Teaching Service Daily Progress Note Intern Pager: [phone]  Patient name: [name] Medical record number: [id number] Date of birth: 1960-12-03 Age: 54 y.o. Gender: female  Primary Care Provider: Levert Feinstein, MD Consultants: Pshychiatry Code Status: Full  Pt Overview and Major Events to Date:  9/8 Admitted for AMS  Assessment and Plan:  [name] is a 54 y.o. female presenting with altered mental status and hypoxia. PMH is significant for COPD, schizophrenia, HIV, hep B, and tobacco abuse.   Acute Encephalopathy/schizophrenia- Improved. Was likely contributed to by marijuana use given + UDS plus hypoxia from COPD exacerbation. CT head neg for acute abnormality - Psych has seen and feels he has no safety concerns. Continue risperdone 3BID, depakote QHS per psych  Hypoxia secondary to acute COPD exacerbation- Improved - Supplemental oxygen as needed to keep sats >88% - D4 Prednisone, D4 antibiotics, will continue for 1 more day - Continue pulmicort bid - Duoneb q4hrs / albuterol q2hrs prn - s/p solumedrol via EMS - on qvar at home and given strong history of COPD, would benefit from combined long acting beta agonist + steroid such as symbicort - will d/c home qvar and start symbicort  HIV / Hep B - Truvada, Prezista, raltegravir, ritonavir  Tobacco Abuse - nicotine patch  FEN/GI: Reg diet, SLIV Prophylaxis: SubQ heparin   Subjective:  Alert, denies chest painm, SOB. Asking to go home today  Objective: Temp:  [97.9 F (36.6 C)-99.1 F (37.3 C)] 98.9 F (37.2 C) (09/12 1314) Pulse Rate:  [31-77] 70 (09/12 1314) Resp:  [18] 18 (09/12 1314) BP: (92-147)/(60-77) 111/69 mmHg (09/12 1314) SpO2:  [78 %-99 %] 91 % (09/12 1314) Physical Exam: General:NAD, lying in bed Cardiovascular:  Respiratory: diffuse mild wheezes, normal WOB Abdomen: soft nontender, + BS Extremities: left leg scarring, no LE edema, non tender  Laboratory:  Recent  Labs Lab 12/17/14 1421  WBC 7.3  HGB 12.5  HCT 37.6  PLT 230    Recent Labs Lab 12/17/14 1421  NA 136  K 4.7  CL 101  CO2 26  BUN 13  CREATININE 1.03*  CALCIUM 8.8*  GLUCOSE 118*      Imaging/Diagnostic Tests: CT head IMPRESSION: Chronic atrophic and ischemic changes without acute abnormality.  CXR IMPRESSION: Coarsening of the interstitial markings which may represent peribronchial thickening seen with reactive airway disease or bronchitis. Alternatively, developing interstitial pulmonary edema may have a similar appearance.    Bonney Aid, MD 12/21/2014, 1:41 PM PGY-2, [facility] FPTS Intern pager: [phone], text pages welcome

## 2014-12-21 NOTE — Care Management Note (Signed)
Case Management Note  Patient Details  Name: NELVA HAUK MRN: [id number] Date of Birth: [birth year]  Subjective/Objective:                  Patient admitted from home, lives with brother whom she states drives her to her appointments. She reports she is followed by the infectious disease clinic and is part of the [facility] program to receive her AIDS medications. Patient is a readmit, discharged two weeks ago. Admitted with hypoxic event, is not oxygen dependant at home. Patient states she does not receive any home health.  12-21-14 Patient much more awake and talkative. Spoke with her and her brother who is her caretaker. They state that they have a nurse who comes from [facility] to help with medication administration. Patient and brother denied any barriers getting to MD office, paying for or obtaining medication, or needing any further assistance in the home. Patient states she walks without any assistance and is able to carry out ADLs at home. Will DC to home with brother.    Action/Plan:   Expected Discharge Date:                  Expected Discharge Plan:  Home/Self Care  In-House Referral:     Discharge planning Services  CM Consult  Post Acute Care Choice:    Choice offered to:     DME Arranged:    DME Agency:     HH Arranged:    HH Agency:     Status of Service:  Completed, signed off  Medicare Important Message Given:    Date Medicare IM Given:    Medicare IM give by:    Date Additional Medicare IM Given:    Additional Medicare Important Message give by:     If discussed at Long Length of Stay Meetings, dates discussed:    Additional Comments:  Lawerance Sabal, RN 12/21/2014, 3:36 PM

## 2014-12-21 NOTE — Progress Notes (Signed)
MD Mcdiarmid paged to notify of patient ambulatory oxygen results.

## 2014-12-21 NOTE — Progress Notes (Signed)
Pneumonia and flu vaccines administered per MD Haney.

## 2014-12-21 NOTE — Progress Notes (Signed)
Pharmacist Provided - Patient Medication Education Prior to Discharge   [name] is an 54 y.o. female who presented to [facility] on 12/17/2014 with a chief complaint of  Chief Complaint  Patient presents with  . Shortness of Breath  . Wheezing      Patient will be discharged with 3 new medications  Patient being discharged without any new medications  The following medications were discussed with the patient: doxycycline, prednisone, Symbicort    Pain Control medications:  Yes     No  Diabetes Medications:  Yes     No  Heart Failure Medications:  Yes     No  Anticoagulation Medications:   Yes     No  Antibiotics at discharge:  Yes     No  Allergy Assessment Completed and Updated:  Yes     No Identified Patient Allergies: No Known Allergies   Medication Adherence Assessment:  Excellent (no doses missed/week)       Good (1 dose missed/week)       Partial (2-3 doses missed/week)       Poor (>3 doses missed/week)  Barriers to Obtaining Medications:  Yes  No  Assessment: Pt's brother manages medications for the pt and they have a home nurse come in weekly. Brother not very familiar with medication names, reports just following instructions on the bottle. Encouraged brother to obtain medication tray to set up weekly medications. Reviewed new medications and medications to be stopped  Time spent preparing for discharge counseling: 15 mins Time spent counseling patient: 30 mins  Remi Haggard, PharmD Clinical Pharmacist- Resident Pager: [phone]  Remi Haggard, PharmD 12/21/2014, 4:00 PM

## 2014-12-21 NOTE — Progress Notes (Signed)
SATURATION QUALIFICATIONS: (This note is used to comply with regulatory documentation for home oxygen)  Patient Saturations on Room Air at Rest = 93%  Patient Saturations on Room Air while Ambulating = 91%  Patient Saturations on 0 Liters of oxygen while Ambulating = 91%  Patient's O2 saturation dropped briefly to 78% upon standing up to go walk per [employer]. Patient recovered quickly to 91%.

## 2014-12-23 ENCOUNTER — Other Ambulatory Visit: Payer: Self-pay | Admitting: *Deleted

## 2014-12-23 ENCOUNTER — Other Ambulatory Visit: Payer: Self-pay | Admitting: Internal Medicine

## 2014-12-23 MED ORDER — POLYETHYLENE GLYCOL 3350 17 G PO PACK: 17.0000 g | PACK | Freq: Every day | ORAL | Status: DC | PRN

## 2014-12-24 ENCOUNTER — Telehealth: Payer: Self-pay | Admitting: Family Medicine

## 2014-12-24 NOTE — Telephone Encounter (Signed)
Fax [phone] Needs last 2 office visit note and lab results faxed to her at [facility]

## 2014-12-29 NOTE — Telephone Encounter (Signed)
Called and verified, information faxed to [facility] provided.

## 2015-01-01 ENCOUNTER — Encounter: Payer: Self-pay | Admitting: Family Medicine

## 2015-01-01 ENCOUNTER — Ambulatory Visit (INDEPENDENT_AMBULATORY_CARE_PROVIDER_SITE_OTHER): Payer: Medicaid Other | Admitting: Family Medicine

## 2015-01-01 VITALS — BP 142/84 | HR 114 | Temp 97.9°F | Ht 62.0 in | Wt 148.0 lb

## 2015-01-01 DIAGNOSIS — J449 Chronic obstructive pulmonary disease, unspecified: Secondary | ICD-10-CM | POA: Diagnosis present

## 2015-01-01 DIAGNOSIS — F200 Paranoid schizophrenia: Secondary | ICD-10-CM

## 2015-01-01 NOTE — Patient Instructions (Addendum)
Thank you for coming to the clinic today. It was nice seeing you.  Im glad that you are doing a lot better. Please continue your medications as prescribed to you at your hospital discharge.  Please keep your appointment with Dr Craige Cotta.  Please come back in 2-3 months for a follow up with Dr Pollie Meyer, or sooner as needed.  Take care,  Dr Jimmey Ralph

## 2015-01-01 NOTE — Progress Notes (Signed)
    Subjective:  [name] is a 54 y.o. female who presents to the [facility] today with a chief complaint of hospital follow up.   HPI:  COPD Patient admitted earlier this month with COPD exacerbation. During her hospitalization, patient was started on symbicort. Since discharge, patient reports that her symptoms are much improved and that she has been breathing "pretty good." Endorses good compliance with her medications with no side effects. Has a follow up with her pulmonologist Dr Craige Cotta next month. No cough  Schizophrenia Patient also admitted for somnolence, likely related to patient's schizophrenia and history of psychosis. Psychiatry was consulted. Patient was continued on her home risperdone and depakote. Since being discharged, patient reports that she has been doing well. States that she has been "cooking, cleaning, and taking care of everything."  ROS: No fevers or chills, otherwise per HPI  Objective:  Physical Exam: BP 142/84 mmHg  Pulse 114  Temp(Src) 97.9 F (36.6 C) (Oral)  Ht  (1.575 m)  Wt 148 lb (67.132 kg)  BMI 27.06 kg/m2  Gen: NAD, resting comfortably CV: RRR with no murmurs appreciated Lungs: NWOB, occasional scattered mild end-expiratory wheeze, otherwise CTAB GI: Normal bowel sounds present. Soft, Nontender, Nondistended. MSK: no edema, cyanosis, or clubbing noted Skin: warm, dry Neuro: grossly normal, moves all extremities Psych: Normal affect and thought content, no apparent AVH. No SI or HI.   Assessment/Plan:  COPD (chronic obstructive pulmonary disease) Currently stable on symbicort and albuterol. Will continue. Has appointment with pulmonologist next month.   Schizophrenia Stable today on risperidone and depakote. Will continue.     Katina Degree. Jimmey Ralph, MD [facility] Family Medicine Resident PGY-2 01/01/2015 11:35 AM

## 2015-01-01 NOTE — Assessment & Plan Note (Signed)
Stable today on risperidone and depakote. Will continue.

## 2015-01-01 NOTE — Assessment & Plan Note (Signed)
Currently stable on symbicort and albuterol. Will continue. Has appointment with pulmonologist next month.

## 2015-01-07 ENCOUNTER — Other Ambulatory Visit: Payer: Self-pay

## 2015-01-07 DIAGNOSIS — Z1231 Encounter for screening mammogram for malignant neoplasm of breast: Secondary | ICD-10-CM

## 2015-01-18 ENCOUNTER — Telehealth: Payer: Self-pay | Admitting: Family Medicine

## 2015-01-18 DIAGNOSIS — B20 Human immunodeficiency virus [HIV] disease: Secondary | ICD-10-CM

## 2015-01-18 NOTE — Telephone Encounter (Signed)
Referral entered Jackie Jutte [name], MD  

## 2015-01-18 NOTE — Telephone Encounter (Signed)
Ms. Vantassell is requesting a referral to the [facility].

## 2015-01-25 ENCOUNTER — Ambulatory Visit
Admission: RE | Admit: 2015-01-25 | Discharge: 2015-01-25 | Disposition: A | Discharge status: Home / Self Care | Payer: Medicaid Other | Source: Ambulatory Visit

## 2015-01-25 DIAGNOSIS — Z1231 Encounter for screening mammogram for malignant neoplasm of breast: Secondary | ICD-10-CM

## 2015-01-26 ENCOUNTER — Telehealth: Payer: Self-pay | Admitting: Family Medicine

## 2015-01-26 DIAGNOSIS — L84 Corns and callosities: Secondary | ICD-10-CM

## 2015-01-26 NOTE — Telephone Encounter (Signed)
Referral entered Jackie Spanbauer J Adriena Manfre, MD  

## 2015-01-26 NOTE — Telephone Encounter (Signed)
Is regularly seen by podiatry for calluses, old referral expired, needs new one. Will forward to MD.

## 2015-01-26 NOTE — Telephone Encounter (Signed)
Pt called and would like a referral to the [facility]. jw °

## 2015-01-28 ENCOUNTER — Ambulatory Visit (INDEPENDENT_AMBULATORY_CARE_PROVIDER_SITE_OTHER): Payer: Medicaid Other | Admitting: Pulmonary Disease

## 2015-01-28 ENCOUNTER — Encounter: Payer: Self-pay | Admitting: Pulmonary Disease

## 2015-01-28 VITALS — BP 112/68 | HR 72 | Ht 59.0 in | Wt 149.0 lb

## 2015-01-28 DIAGNOSIS — J45909 Unspecified asthma, uncomplicated: Secondary | ICD-10-CM

## 2015-01-28 DIAGNOSIS — J449 Chronic obstructive pulmonary disease, unspecified: Secondary | ICD-10-CM

## 2015-01-28 DIAGNOSIS — Z720 Tobacco use: Secondary | ICD-10-CM | POA: Diagnosis not present

## 2015-01-28 NOTE — Patient Instructions (Signed)
Follow up in 1 year.

## 2015-01-28 NOTE — Progress Notes (Signed)
Chief Complaint  Patient presents with  . Follow-up    6 mo. f/u for chronic bronchitis. pt. states breathing is better. no SOB. occ. wheezing at night.  occ. prod. cough yellow in color.. no chest pain/tightness. states she was at [facility] for five days 1 mo. ago  was on 02 while there.    History of Present Illness: [name] is a 54 y.o. female smoker with chronic asthmatic bronchitis.  She has been in hospital few times since her last visit.  Her breathing has been okay recently.  She gets occasional cough and sputum.  She still smokes, but is down to 10 cigarettes per day.  She uses her inhalers and nebulizer >> these help.  She denies skin rash or leg swelling.   TESTS: CT chest 11/06/05 >> b/l hilar/mediastinal LAN CT chest 04/06/14 >> b/l calcified granulomas. PFT 05/13/14 >> FEV1 1.29 (72%), FEV1% 85, TLC 2.96 (68%), DLCO 70%, no BD  PMHx >> GERD, Schizophrenia, HIV, Hepatitis B, PPD positive, Seizures  Past surgical hx, Medications, Allergies, Family hx, Social hx all reviewed.   Physical Exam: BP 112/68 mmHg  Pulse 72  Ht 4\' 11"  (1.499 m)  Wt 149 lb (67.586 kg)  BMI 30.08 kg/m2  SpO2 94%  General - No distress ENT - No sinus tenderness, no oral exudate, no LAN Cardiac - s1s2 regular, no murmur Chest - No wheeze/rales/dullness Back - No focal tenderness Abd - Soft, non-tender Ext - No edema Neuro - Normal strength Skin - No rashes Psych - normal mood, and behavior  Assessment/Plan:  Chronic asthmatic bronchitis. Plan: - continue symbicort and prn albuterol  Tobacco abuse. Plan: - discussed options to help with smoking cessation   Hx of HIV. Plan: - she is followed in ID clinic   Jackie HellingVineet Bob Eastwood, MD [place] [facility]/Sleep Pager:  [phone]

## 2015-02-11 ENCOUNTER — Encounter: Payer: Self-pay | Admitting: Internal Medicine

## 2015-02-11 ENCOUNTER — Ambulatory Visit (INDEPENDENT_AMBULATORY_CARE_PROVIDER_SITE_OTHER): Payer: Medicaid Other | Admitting: Internal Medicine

## 2015-02-11 VITALS — BP 117/80 | HR 66 | Temp 97.7°F | Ht 59.0 in | Wt 149.0 lb

## 2015-02-11 DIAGNOSIS — J4531 Mild persistent asthma with (acute) exacerbation: Secondary | ICD-10-CM | POA: Diagnosis not present

## 2015-02-11 DIAGNOSIS — F203 Undifferentiated schizophrenia: Secondary | ICD-10-CM

## 2015-02-11 DIAGNOSIS — B20 Human immunodeficiency virus [HIV] disease: Secondary | ICD-10-CM

## 2015-02-11 LAB — CBC WITH DIFFERENTIAL/PLATELET
Basophils Absolute: 0 10*3/uL (ref 0.0–0.1)
Basophils Relative: 1 % (ref 0–1)
EOS PCT: 2 % (ref 0–5)
Eosinophils Absolute: 0.1 10*3/uL (ref 0.0–0.7)
HEMATOCRIT: 46.2 % — AB (ref 36.0–46.0)
HEMOGLOBIN: 15.6 g/dL — AB (ref 12.0–15.0)
LYMPHS ABS: 1.7 10*3/uL (ref 0.7–4.0)
LYMPHS PCT: 36 % (ref 12–46)
MCH: 31.2 pg (ref 26.0–34.0)
MCHC: 33.8 g/dL (ref 30.0–36.0)
MCV: 92.4 fL (ref 78.0–100.0)
MONO ABS: 0.4 10*3/uL (ref 0.1–1.0)
MONOS PCT: 9 % (ref 3–12)
MPV: 9.8 fL (ref 8.6–12.4)
NEUTROS ABS: 2.4 10*3/uL (ref 1.7–7.7)
Neutrophils Relative %: 52 % (ref 43–77)
Platelets: 219 10*3/uL (ref 150–400)
RBC: 5 MIL/uL (ref 3.87–5.11)
RDW: 15.6 % — AB (ref 11.5–15.5)
WBC: 4.6 10*3/uL (ref 4.0–10.5)

## 2015-02-11 LAB — COMPLETE METABOLIC PANEL WITH GFR
ALBUMIN: 4.4 g/dL (ref 3.6–5.1)
ALK PHOS: 122 U/L (ref 33–130)
ALT: 11 U/L (ref 6–29)
AST: 20 U/L (ref 10–35)
BILIRUBIN TOTAL: 0.5 mg/dL (ref 0.2–1.2)
BUN: 7 mg/dL (ref 7–25)
CALCIUM: 9.5 mg/dL (ref 8.6–10.4)
CO2: 21 mmol/L (ref 20–31)
CREATININE: 1.06 mg/dL — AB (ref 0.50–1.05)
Chloride: 100 mmol/L (ref 98–110)
GFR, Est African American: 68 mL/min (ref >=60)
GFR, Est Non African American: 59 mL/min — ABNORMAL LOW (ref >=60)
GLUCOSE: 78 mg/dL (ref 65–99)
Potassium: 3.9 mmol/L (ref 3.5–5.3)
Sodium: 134 mmol/L — ABNORMAL LOW (ref 135–146)
TOTAL PROTEIN: 7.8 g/dL (ref 6.1–8.1)

## 2015-02-11 MED ORDER — ALBUTEROL SULFATE (2.5 MG/3ML) 0.083% IN NEBU
2.5000 mg | INHALATION_SOLUTION | Freq: Once | RESPIRATORY_TRACT | Status: AC
  Administered 2015-02-11: 2.5 mg via RESPIRATORY_TRACT

## 2015-02-11 NOTE — Progress Notes (Signed)
Patient ID: [name], female   DOB: [birth year], 54 y.o.   MRN: [id number]       Patient ID: [name], female   DOB: 12-07-1960, 54 y.o.   MRN: [id number]  HPI 54 yo F with HIV disease, schizophrenia, COPD,  CD 4 count 670/VL<20 (april 2016) on RLG- truvada-DRVr. Her meds are dispensed by care giver. Decreased smoking now down to 1/2 ppd. cigs dispensed by her family.  She reports having some increase cough, but not a productive cough, no worsening in shortness of breath. She denies fever or chills or nightsweats  Outpatient Encounter Prescriptions as of 02/11/2015  Medication Sig  . albuterol (PROVENTIL) (2.5 MG/3ML) 0.083% nebulizer solution INHALE CONTENTS OF 1 VIAL USING NEBULIZER EVERY 6 HOURS AS NEEDED FOR WHEEZING  . budesonide-formoterol (SYMBICORT) 80-4.5 MCG/ACT inhaler Inhale 2 puffs into the lungs 2 (two) times daily.  . Darunavir Ethanolate (PREZISTA) 800 MG tablet Take 1 tablet (800 mg total) by mouth daily.  . divalproex (DEPAKOTE SPRINKLE) 125 MG capsule Take 4 capsules (500 mg total) by mouth at bedtime.  Marland Kitchen emtricitabine-tenofovir (TRUVADA) 200-300 MG per tablet Take 1 tablet by mouth daily.  . ISENTRESS 400 MG tablet TAKE ONE TABLET   BY MOUTH   TWICE A DAY  . paliperidone (INVEGA SUSTENNA) 234 MG/1.5ML SUSP injection Inject 234 mg into the muscle once.  . polyethylene glycol (MIRALAX / GLYCOLAX) packet Take 17 g by mouth daily as needed for mild constipation.  . ranitidine (ZANTAC) 150 MG tablet Take 150 mg by mouth 2 (two) times daily.  . risperiDONE (RISPERDAL M-TABS) 3 MG disintegrating tablet Take 1 tablet (3 mg total) by mouth 2 (two) times daily.  . risperiDONE microspheres (RISPERDAL CONSTA) 50 MG injection Inject 50 mg into the muscle every 30 (thirty) days.   . ritonavir (NORVIR) 100 MG TABS tablet Take 1 tablet (100 mg total) by mouth daily.   No facility-administered encounter medications on file as of 02/11/2015.     Patient Active Problem List   Diagnosis  Date Noted  . Altered mental status   . Hypoxemia 12/17/2014  . Acute respiratory distress (HCC)   . Encounter for preadmission testing   . Diabetes (HCC) 12/01/2014  . Homicidal ideation   . Hypoxia   . Lethargy   . Wheezing   . Delusional disorder (HCC)   . Asthma, chronic   . HIV disease (HCC)   . Paranoid schizophrenia (HCC)   . Psychosis 10/30/2014  . Psychoses   . Hyponatremia 10/22/2014  . Microscopic hematuria 01/29/2014  . Abdominal pain 12/12/2013  . Vulvar irritation 12/12/2013  . Tobacco use disorder 02/18/2013  . Hepatitis B infection   . Pre-ulcerative corn or callous 05/07/2012  . Urinary incontinence, nocturnal enuresis 05/07/2012  . Fecal incontinence 05/07/2012  . Systolic murmur 05/07/2012  . Routine health maintenance 05/07/2012  . Asthma 08/18/2010  . HIV (human immunodeficiency virus infection) (HCC) 08/18/2010  . Schizophrenia (HCC) 08/18/2010  . Herpes simplex infection 08/18/2010     Health Maintenance Due  Topic Date Due  . FOOT EXAM  09/09/1969  . OPHTHALMOLOGY EXAM  09/09/1969  . URINE MICROALBUMIN  09/09/1969  . COLONOSCOPY  09/09/2009     Review of Systems  Constitutional: Negative for fever, chills, diaphoresis, activity change, appetite change, fatigue and unexpected weight change.  HENT: Negative for congestion, sore throat, rhinorrhea, sneezing, trouble swallowing and sinus pressure.  Eyes: Negative for photophobia and visual disturbance.  Respiratory: positive for cough, chest  tightness, but negative for shortness of breath, wheezing and stridor.  Cardiovascular: Negative for chest pain, palpitations and leg swelling.  Gastrointestinal: Negative for nausea, vomiting, abdominal pain, diarrhea, constipation, blood in stool, abdominal distention and anal bleeding.  Genitourinary: Negative for dysuria, hematuria, flank pain and difficulty urinating.  Musculoskeletal: Negative for myalgias, back pain, joint swelling, arthralgias and  gait problem.  Skin: Negative for color change, pallor, rash and wound.  Neurological: Negative for dizziness, tremors, weakness and light-headedness.  Hematological: Negative for adenopathy. Does not bruise/bleed easily.  Psychiatric/Behavioral: Negative for behavioral problems, confusion, sleep disturbance, dysphoric mood, decreased concentration and agitation.   Physical Exam   BP 117/80 mmHg  Pulse 72  Temp(Src) 97.7 F (36.5 C) (Oral)  Ht 4\' 11"  (1.499 m)  Wt 149 lb (67.586 kg)  BMI 30.08 kg/m2 Physical Exam  Constitutional:  oriented to person, place, and time. appears well-developed and well-nourished. No distress.  HENT: [place]/AT, PERRLA, no scleral icterus Mouth/Throat: Oropharynx is clear and moist. No oropharyngeal exudate.  Cardiovascular: Normal rate, regular rhythm and normal heart sounds. Exam reveals no gallop and no friction rub.  No murmur heard.  Pulmonary/Chest: Effort normal and breath sounds normal. No respiratory distress.  Mild  Wheezes at left base Neck = supple, no nuchal rigidity Abdominal: Soft. Bowel sounds are normal.  exhibits no distension. There is no tenderness.  Lymphadenopathy: no cervical adenopathy. No axillary adenopathy Neurological: alert and oriented to person, place, and time.  Skin: Skin is warm and dry. No rash noted. No erythema.  Psychiatric: a normal mood and affect.  behavior is normal. akistheisa  Lab Results  Component Value Date   CD4TCELL 32* 07/14/2014   Lab Results  Component Value Date   CD4TABS 670 07/14/2014   CD4TABS 420 04/13/2014   CD4TABS 550 09/22/2013   Lab Results  Component Value Date   HIV1RNAQUANT <20 07/14/2014   No results found for: HEPBSAB No results found for: RPR  CBC Lab Results  Component Value Date   WBC 7.3 12/17/2014   RBC 4.06 12/17/2014   HGB 12.5 12/17/2014   HCT 37.6 12/17/2014   PLT 230 12/17/2014   MCV 92.6 12/17/2014   MCH 30.8 12/17/2014   MCHC 33.2 12/17/2014   RDW 16.2*  12/17/2014   LYMPHSABS 3.2 11/08/2014   MONOABS 0.4 11/08/2014   EOSABS 0.2 11/08/2014   BASOSABS 0.0 11/08/2014   BMET Lab Results  Component Value Date   NA 136 12/17/2014   K 4.7 12/17/2014   CL 101 12/17/2014   CO2 26 12/17/2014   GLUCOSE 118* 12/17/2014   BUN 13 12/17/2014   CREATININE 1.03* 12/17/2014   CALCIUM 8.8* 12/17/2014   GFRNONAA >60 12/17/2014   GFRAA >60 12/17/2014     Assessment and Plan  hiv disease = continue on current regimen, will check labs today  Wheezing/COPD = no other symptoms to suggest COPD exacerbation or need for steroids at the time. We will give albuterol nebulizer in clinic to see if improves her symptoms. Recommend to use nebulizer  shcizophrenia = she some akisthesia during the visit, that I wonder could be related to psych meds. If still present at next visit, will discuss with her mental health providers regarding titration of meds.  Health maintenance = already had flu vaccination

## 2015-02-11 NOTE — Progress Notes (Signed)
Patient with persistent cough and audible wheezing. Verbal order per Dr. Drue SecondSnider for 1 nebulizer treatment with albuterol 0.083%.  Pt's O2 sats obtained before and after administration.  Her lung sounds were markedly improved, patient states "I feel much better" at the end of the treatment. Andree CossHowell, Jquan Egelston M, RN

## 2015-02-12 LAB — T-HELPER CELL (CD4) - (RCID CLINIC ONLY)
CD4 T CELL ABS: 610 /uL (ref 400–2700)
CD4 T CELL HELPER: 33 % (ref 33–55)

## 2015-02-12 LAB — LIPID PANEL
Cholesterol: 156 mg/dL (ref 125–200)
HDL: 59 mg/dL (ref >=46)
LDL CALC: 84 mg/dL (ref <130)
Total CHOL/HDL Ratio: 2.6 Ratio (ref <=5.0)
Triglycerides: 67 mg/dL (ref <150)
VLDL: 13 mg/dL (ref <30)

## 2015-02-14 LAB — HIV-1 RNA QUANT-NO REFLEX-BLD
HIV 1 RNA Quant: 39 copies/mL — ABNORMAL HIGH (ref <20)
HIV-1 RNA Quant, Log: 1.59 Log copies/mL — ABNORMAL HIGH (ref <1.30)

## 2015-02-14 LAB — RPR

## 2015-02-15 ENCOUNTER — Encounter: Payer: Self-pay | Admitting: Family Medicine

## 2015-02-15 ENCOUNTER — Ambulatory Visit (INDEPENDENT_AMBULATORY_CARE_PROVIDER_SITE_OTHER): Payer: Medicaid Other | Admitting: Family Medicine

## 2015-02-15 VITALS — BP 152/61 | HR 66 | Temp 97.8°F | Ht 59.0 in | Wt 151.5 lb

## 2015-02-15 DIAGNOSIS — E119 Type 2 diabetes mellitus without complications: Secondary | ICD-10-CM | POA: Diagnosis present

## 2015-02-15 DIAGNOSIS — J4520 Mild intermittent asthma, uncomplicated: Secondary | ICD-10-CM

## 2015-02-15 DIAGNOSIS — H6123 Impacted cerumen, bilateral: Secondary | ICD-10-CM

## 2015-02-15 MED ORDER — METFORMIN HCL 500 MG PO TABS: 500.0000 mg | ORAL_TABLET | Freq: Two times a day (BID) | ORAL | Status: DC

## 2015-02-15 NOTE — Patient Instructions (Addendum)
Take metformin 500mg  twice a day Referring to eye doctor Checking your kidneys today via urine test  See me in 1 month  Be well, Dr. Pollie MeyerMcIntyre

## 2015-02-15 NOTE — Progress Notes (Signed)
Date of Visit: 02/15/2015   HPI:  Breathing: feels good. Saw pulmonary doctor. Using albuterol every night because she needs it. Is using controller medicine. Smoking 10 cigarettes per day. Not interested in quitting  Earwax: having trouble hearing out of R ear and thinks she has earwax. Would like them flushed out today.  Diabetes: new diagnosis of diabetes with A1c of 6.7 on 8/22. Patient was not aware of this result as this is the first time she's seen me since that hospitalization. Has not seen eye doctor recently. Does eat lots of sweets.  ROS: See HPI.  PMFSH: history of asthma, HSV, hep B, HIV, tobacco abuse, schizophrenia, diabetes   PHYSICAL EXAM: BP 152/61 mmHg  Pulse 66  Temp(Src) 97.8 F (36.6 C) (Oral)  Ht 4\' 11"  (1.499 m)  Wt 151 lb 8 oz (68.72 kg)  BMI 30.58 kg/m2 Gen: NAD, pleasant, cooperative HEENT: normocephalic, atraumatic. Bilateral tympanic membrane's obscured by cerumen impaction Heart: regular rate and rhythm no murmur Lungs: clear to auscultation bilaterally NWOB Neuro: grossly nonfocal speech normal, mental status at baseline Ext: No appreciable lower extremity edema bilaterally   ASSESSMENT/PLAN:  Diabetes New diagnosis. Start metformin 500mg  twice daily. Follow up in 1 month.  Renal: check urine micro albumin today Eye: refer to ophtho for eye exam Foot: defer to future visit   Asthma Symptoms presently acceptable, although is using albuterol daily. Likely this will continue as long as she is smoking. Encouraged smoking cessation.  Cerumen impaction  Had planned for ear lavage today but apparently when staff went in to do it, patient reported her hearing was better and declined lavage. Follow up as needed.   FOLLOW UP: F/u in 1 month for diabetes Refer to ophtho for eye exam  [name], MD [facility]

## 2015-02-16 ENCOUNTER — Ambulatory Visit: Payer: Medicaid Other | Admitting: Podiatry

## 2015-02-16 ENCOUNTER — Encounter: Payer: Self-pay | Admitting: Podiatry

## 2015-02-16 ENCOUNTER — Ambulatory Visit (INDEPENDENT_AMBULATORY_CARE_PROVIDER_SITE_OTHER): Payer: Medicaid Other | Admitting: Podiatry

## 2015-02-16 DIAGNOSIS — Q828 Other specified congenital malformations of skin: Secondary | ICD-10-CM | POA: Diagnosis not present

## 2015-02-16 LAB — MICROALBUMIN / CREATININE URINE RATIO
CREATININE, URINE: 101 mg/dL (ref 20–320)
MICROALB UR: 0.3 mg/dL
MICROALB/CREAT RATIO: 3 ug/mg{creat} (ref <30)

## 2015-02-17 NOTE — Progress Notes (Signed)
Patient ID: [name], female   DOB: 12/09/1960, 54 y.o.   MRN: [id number]  Subjective: Patient presents for painful, recurrent calluses bottom of her right foot. She has pain became with pressure in shoe gear. Denies any redness or drainage. She has assured the left foot that she does not want to take it off and have the left foot evaluated today as she is having no problems. No recent injury or trauma. No swelling or redness. No other complaints.  Objective: AAO 3, NAD DP/PT pulses 2/4 , CRT less than 3 seconds  Protective sensation intact with Dorann OuSimms Weinstein monofilament Hyperkeratotic lesion right foot submetatarsal 5 with mild tenderness to palpation. Upon debridement no underlying ulceration, drainage or other signs of infection. No other open lesions or pre-ulcerative lesions. No other areas of tenderness to bilateral lower extremity's. No overlying edema, erythema, increase in warmth No pain with calf compression, swelling, warmth, erythema  Assessment:  54 year old female with right submetatarsal 5 hyperkeratotic lesion   Plan: -Treatment options discussed including all alternatives, risks, and complications -Lesions debrided without complication/bleeding. -Discussed daily foot inspection -Follow-up as needed. In the meantime, encouraged to call the office with any questions, concerns, change in symptoms.   Ovid CurdMatthew Uel Davidow, DPM

## 2015-02-19 NOTE — Assessment & Plan Note (Signed)
New diagnosis. Start metformin 500mg  twice daily. Follow up in 1 month.  Renal: check urine micro albumin today Eye: refer to ophtho for eye exam Foot: defer to future visit

## 2015-02-19 NOTE — Assessment & Plan Note (Signed)
Symptoms presently acceptable, although is using albuterol daily. Likely this will continue as long as she is smoking. Encouraged smoking cessation.

## 2015-02-22 ENCOUNTER — Other Ambulatory Visit: Payer: Self-pay | Admitting: *Deleted

## 2015-02-23 ENCOUNTER — Encounter: Payer: Self-pay | Admitting: Family Medicine

## 2015-02-25 MED ORDER — POLYETHYLENE GLYCOL 3350 17 G PO PACK: 17.0000 g | PACK | Freq: Every day | ORAL | Status: DC | PRN

## 2015-04-08 LAB — HM DIABETES EYE EXAM

## 2015-04-23 ENCOUNTER — Other Ambulatory Visit: Payer: Self-pay | Admitting: *Deleted

## 2015-04-23 MED ORDER — RANITIDINE HCL 150 MG PO TABS: 150.0000 mg | ORAL_TABLET | Freq: Two times a day (BID) | ORAL | Status: DC

## 2015-04-26 ENCOUNTER — Other Ambulatory Visit: Payer: Self-pay | Admitting: *Deleted

## 2015-04-26 MED ORDER — ALBUTEROL SULFATE (2.5 MG/3ML) 0.083% IN NEBU: INHALATION_SOLUTION | RESPIRATORY_TRACT | Status: DC

## 2015-04-27 ENCOUNTER — Ambulatory Visit: Payer: Self-pay | Admitting: Family Medicine

## 2015-04-30 ENCOUNTER — Ambulatory Visit: Payer: Medicaid Other | Admitting: Podiatry

## 2015-05-03 ENCOUNTER — Encounter (HOSPITAL_COMMUNITY): Payer: Self-pay | Admitting: Family Medicine

## 2015-05-03 ENCOUNTER — Ambulatory Visit: Payer: Self-pay | Admitting: Family Medicine

## 2015-05-03 ENCOUNTER — Emergency Department (HOSPITAL_COMMUNITY)
Admission: EM | Admit: 2015-05-03 | Discharge: 2015-05-03 | Disposition: A | Discharge status: Home / Self Care | Payer: Medicaid Other | Attending: Emergency Medicine | Admitting: Emergency Medicine

## 2015-05-03 ENCOUNTER — Emergency Department (HOSPITAL_COMMUNITY): Payer: Medicaid Other

## 2015-05-03 DIAGNOSIS — J441 Chronic obstructive pulmonary disease with (acute) exacerbation: Secondary | ICD-10-CM | POA: Insufficient documentation

## 2015-05-03 DIAGNOSIS — Z79899 Other long term (current) drug therapy: Secondary | ICD-10-CM | POA: Diagnosis not present

## 2015-05-03 DIAGNOSIS — R0602 Shortness of breath: Secondary | ICD-10-CM | POA: Diagnosis present

## 2015-05-03 DIAGNOSIS — F209 Schizophrenia, unspecified: Secondary | ICD-10-CM | POA: Insufficient documentation

## 2015-05-03 DIAGNOSIS — K219 Gastro-esophageal reflux disease without esophagitis: Secondary | ICD-10-CM | POA: Insufficient documentation

## 2015-05-03 DIAGNOSIS — F17210 Nicotine dependence, cigarettes, uncomplicated: Secondary | ICD-10-CM | POA: Insufficient documentation

## 2015-05-03 DIAGNOSIS — Z8619 Personal history of other infectious and parasitic diseases: Secondary | ICD-10-CM | POA: Diagnosis not present

## 2015-05-03 DIAGNOSIS — Z7984 Long term (current) use of oral hypoglycemic drugs: Secondary | ICD-10-CM | POA: Insufficient documentation

## 2015-05-03 DIAGNOSIS — J449 Chronic obstructive pulmonary disease, unspecified: Secondary | ICD-10-CM

## 2015-05-03 LAB — CBC
HEMATOCRIT: 42.3 % (ref 36.0–46.0)
HEMOGLOBIN: 14.4 g/dL (ref 12.0–15.0)
MCH: 29 pg (ref 26.0–34.0)
MCHC: 34 g/dL (ref 30.0–36.0)
MCV: 85.1 fL (ref 78.0–100.0)
Platelets: 242 10*3/uL (ref 150–400)
RBC: 4.97 MIL/uL (ref 3.87–5.11)
RDW: 15.8 % — ABNORMAL HIGH (ref 11.5–15.5)
WBC: 7.3 10*3/uL (ref 4.0–10.5)

## 2015-05-03 LAB — BASIC METABOLIC PANEL
ANION GAP: 12 (ref 5–15)
BUN: <5 mg/dL — ABNORMAL LOW (ref 6–20)
CALCIUM: 8.8 mg/dL — AB (ref 8.9–10.3)
CO2: 25 mmol/L (ref 22–32)
Chloride: 89 mmol/L — ABNORMAL LOW (ref 101–111)
Creatinine, Ser: 1.01 mg/dL — ABNORMAL HIGH (ref 0.44–1.00)
GLUCOSE: 108 mg/dL — AB (ref 65–99)
POTASSIUM: 4.9 mmol/L (ref 3.5–5.1)
Sodium: 126 mmol/L — ABNORMAL LOW (ref 135–145)

## 2015-05-03 MED ORDER — ALBUTEROL (5 MG/ML) CONTINUOUS INHALATION SOLN
10.0000 mg/h | INHALATION_SOLUTION | RESPIRATORY_TRACT | Status: AC
  Administered 2015-05-03: 10 mg/h via RESPIRATORY_TRACT
  Filled 2015-05-03: qty 20

## 2015-05-03 MED ORDER — PREDNISONE 50 MG PO TABS: 50.0000 mg | ORAL_TABLET | Freq: Every day | ORAL | Status: DC

## 2015-05-03 MED ORDER — ALBUTEROL SULFATE (2.5 MG/3ML) 0.083% IN NEBU: INHALATION_SOLUTION | RESPIRATORY_TRACT | Status: DC

## 2015-05-03 MED ORDER — ALBUTEROL SULFATE (2.5 MG/3ML) 0.083% IN NEBU
5.0000 mg | INHALATION_SOLUTION | Freq: Once | RESPIRATORY_TRACT | Status: AC
  Administered 2015-05-03: 5 mg via RESPIRATORY_TRACT

## 2015-05-03 MED ORDER — ALBUTEROL SULFATE (2.5 MG/3ML) 0.083% IN NEBU
INHALATION_SOLUTION | RESPIRATORY_TRACT | Status: AC
  Filled 2015-05-03: qty 3

## 2015-05-03 NOTE — ED Notes (Signed)
Placed pt on RA (removed Payne) to see what spo2 will be

## 2015-05-03 NOTE — ED Notes (Signed)
Pt continues to walk around ED and come up to desk. Wait time explained to pt multiple times. Pt given warm blanket.

## 2015-05-03 NOTE — Discharge Instructions (Signed)
Return here as needed.  Follow-up with your primary care doctor.  Your sodium was low, so they will need to recheck your labs

## 2015-05-03 NOTE — ED Notes (Signed)
Pt here with SOB, wheezing. When EMS arrived pt was smoking and arguing with family. Given albuterol and coughing sputum.

## 2015-05-03 NOTE — ED Notes (Signed)
Pt asked not to leave [facility] but pt states she is going outside to "get some fresh air."

## 2015-05-03 NOTE — ED Notes (Signed)
pts sister [phone]

## 2015-05-03 NOTE — ED Notes (Signed)
Pt asking to leave. Pt vomiting in sink, continuing to request to be discharged. PA made aware. Pt continues to refuse vitals.

## 2015-05-03 NOTE — ED Provider Notes (Signed)
CSN: [id number]     Arrival date & time 05/03/15  1601 History   First MD Initiated Contact with Patient 05/03/15 1924     Chief Complaint  Patient presents with  . Shortness of Breath     (Consider location/radiation/quality/duration/timing/severity/associated sxs/prior Treatment) HPI Keturah Yerby is a 55 year old female with a past medical history of COPD, asthma, HIV and T2DM who presents to teh ED today for SOB. Pateint said around 1pm she felt like she couldn't breath.She is chronically short of breath but this is worse. She takes albuterol at home and does not have further treatment. She uses her brothers oxygen tank as needed, but does not have her own. She endorses an increasing productive cough with yellow sputum. Denies hemoptysis. Patient says she has been to [facility] 3 times for this same issue. Patient denies constitutional symptoms, diizziness, lightheadedness, syncope, CP, nausea, vomiting, dysuria. Past Medical History  Diagnosis Date  . GERD (gastroesophageal reflux disease)   . Schizophrenia (HCC)   . AIDS (HCC) 12-10-2007  . Herpes simplex   . History of cholecystectomy   . Cataract   . Nonspecific reaction to tuberculin skin test without active tuberculosis 11-2008 [facility]   . Seizures (HCC)   . Hepatitis B infection     followed by ID  . Chronic asthmatic bronchitis (HCC)   . Tobacco use    Past Surgical History  Procedure Laterality Date  . Cholecystectomy    . Leg surgery Right     s/p accident; "steel fell on the back of my leg"  . Tonsillectomy     Family History  Problem Relation Age of Onset  . Diabetes Sister   . Cancer Sister     breast  . Diabetes Brother   . Hypertension Brother   . Cancer Mother     lung  . Hypertension Mother   . Heart disease Father    Social History  Substance Use Topics  . Smoking status: Current Every Day Smoker -- 0.50 packs/day for 35 years    Types: Cigarettes    Start date: 04/10/1974  . Smokeless tobacco: Never  Used     Comment: Previously smoked 2ppd, might start patch  . Alcohol Use: No     Comment: Patient denies    OB History    No data available     Review of Systems  Constitutional: Negative for fever, chills, diaphoresis, appetite change and fatigue.  Respiratory: Positive for cough, shortness of breath and wheezing. Negative for chest tightness.   Cardiovascular: Negative for chest pain, palpitations and leg swelling.  Gastrointestinal: Negative for nausea, vomiting, abdominal pain, diarrhea, constipation, blood in stool and abdominal distention.  Genitourinary: Negative for dysuria and difficulty urinating.  Allergic/Immunologic: Positive for immunocompromised state.  Neurological: Negative for dizziness, syncope, weakness, light-headedness and headaches.      Allergies  Review of patient's allergies indicates no known allergies.  Home Medications   Prior to Admission medications   Medication Sig Start Date End Date Taking? Authorizing Provider  albuterol (PROVENTIL) (2.5 MG/3ML) 0.083% nebulizer solution INHALE CONTENTS OF 1 VIAL USING NEBULIZER EVERY 6 HOURS AS NEEDED FOR WHEEZING 04/26/15   Ginnie Smart, MD  budesonide-formoterol [facility]) 80-4.5 MCG/ACT inhaler Inhale 2 puffs into the lungs 2 (two) times daily. 12/19/14   [name], MD  Darunavir Ethanolate (PREZISTA) 800 MG tablet Take 1 tablet (800 mg total) by mouth daily. 11/19/14   Judyann Munson, MD  divalproex (DEPAKOTE SPRINKLE) 125 MG  capsule Take 4 capsules (500 mg total) by mouth at bedtime. 11/23/14   Shari Prows, MD  emtricitabine-tenofovir (TRUVADA) 200-300 MG per tablet Take 1 tablet by mouth daily. 11/19/14   Judyann Munson, MD  ISENTRESS 400 MG tablet TAKE ONE TABLET   BY MOUTH   TWICE A DAY 12/23/14   Judyann Munson, MD  metFORMIN (GLUCOPHAGE) 500 MG tablet Take 1 tablet (500 mg total) by mouth 2 (two) times daily with a meal. 02/15/15   [name], MD  paliperidone (INVEGA SUSTENNA)  234 MG/1.5ML SUSP injection Inject 234 mg into the muscle once. 12/18/14   Shari Prows, MD  polyethylene glycol (MIRALAX / GLYCOLAX) packet Take 17 g by mouth daily as needed for mild constipation. 02/25/15   [name], MD  ranitidine (ZANTAC) 150 MG tablet Take 1 tablet (150 mg total) by mouth 2 (two) times daily. 04/23/15   [name], MD  risperiDONE (RISPERDAL M-TABS) 3 MG disintegrating tablet Take 1 tablet (3 mg total) by mouth 2 (two) times daily. 11/23/14   Shari Prows, MD  risperiDONE microspheres (RISPERDAL CONSTA) 50 MG injection Inject 50 mg into the muscle every 30 (thirty) days.     Historical Provider, MD  ritonavir (NORVIR) 100 MG TABS tablet Take 1 tablet (100 mg total) by mouth daily. 11/19/14   Judyann Munson, MD   BP 85/60 mmHg  Pulse 102  Temp(Src) 98.8 F (37.1 C) (Oral)  Resp 17  SpO2 94% Physical Exam  Constitutional: She is oriented to person, place, and time. She appears well-developed and well-nourished. No distress.  HENT:  Head: Normocephalic and atraumatic.  Mouth/Throat: Oropharynx is clear and moist. No oropharyngeal exudate.  Eyes: Pupils are equal, round, and reactive to light.  Neck: Normal range of motion. Neck supple.  Cardiovascular: Normal rate, regular rhythm, normal heart sounds and intact distal pulses.  Exam reveals no gallop and no friction rub.   No murmur heard. Pulmonary/Chest: Effort normal. No respiratory distress. She has wheezes. She has no rales.  Wheezes in all fields and some crackles that clear with cough  Abdominal: Soft. Bowel sounds are normal. She exhibits no distension. There is no tenderness.  Musculoskeletal: She exhibits no edema.  Neurological: She is alert and oriented to person, place, and time. She exhibits normal muscle tone. Coordination normal.  Skin: Skin is warm and dry. She is not diaphoretic.  Psychiatric: She has a normal mood and affect. Her behavior is normal. Her speech is rapid  and/or pressured.  Nursing note and vitals reviewed.   ED Course  Procedures (including critical care time) Labs Review Labs Reviewed  BASIC METABOLIC PANEL - Abnormal; Notable for the following:    Sodium 126 (*)    Chloride 89 (*)    Glucose, Bld 108 (*)    BUN <5 (*)    Creatinine, Ser 1.01 (*)    Calcium 8.8 (*)    All other components within normal limits  CBC - Abnormal; Notable for the following:    RDW 15.8 (*)    All other components within normal limits    Imaging Review Dg Chest 2 View  05/03/2015  CLINICAL DATA:  Yellowish Productive cough Sob X 1 week HX COPD, 2 Pack per day smoker, diabetes EXAM: CHEST  2 VIEW COMPARISON:  12/17/2014 FINDINGS: Lungs are hyperinflated. Heart is enlarged. Prominence of interstitial markings appears stable compared prior study. Perihilar peribronchial thickening. There are no focal consolidations. No pleural effusions. Calcified granulomata  again noted at the bases. IMPRESSION: 1. Bronchitic changes. 2.  No focal acute pulmonary abnormality. Electronically Signed   By: Norva Pavlov M.D.   On: 05/03/2015 20:08   I have personally reviewed and evaluated these images and lab results as part of my medical decision-making.   EKG Interpretation None      The patient does not want to be admitted to the hospital.  She does not want any IV fluids.  She would like to be discharged home.  She states that she is going to follow up with her primary care doctor.  Advised her that she has follow-up and have her sodium rechecked the patient agrees the plan.  I did advised her that her condition could worsen and she will need to return to the hospital.  Patient states she feels better at this time after the hour-long neb treatment.    Charlestine Night, PA-C 05/04/15 0129  Vanetta Mulders, MD 05/04/15 [phone]

## 2015-05-06 ENCOUNTER — Ambulatory Visit: Payer: Self-pay | Admitting: Family Medicine

## 2015-05-06 ENCOUNTER — Encounter (HOSPITAL_COMMUNITY): Payer: Self-pay | Admitting: Emergency Medicine

## 2015-05-06 ENCOUNTER — Emergency Department (HOSPITAL_COMMUNITY): Payer: Medicaid Other

## 2015-05-06 ENCOUNTER — Other Ambulatory Visit: Payer: Self-pay

## 2015-05-06 ENCOUNTER — Inpatient Hospital Stay (HOSPITAL_COMMUNITY)
Admission: EM | Admit: 2015-05-06 | Discharge: 2015-05-11 | DRG: 190 | Disposition: A | Discharge status: Home / Self Care | Payer: Medicaid Other | Attending: Family Medicine | Admitting: Family Medicine

## 2015-05-06 DIAGNOSIS — Z79899 Other long term (current) drug therapy: Secondary | ICD-10-CM | POA: Diagnosis not present

## 2015-05-06 DIAGNOSIS — E872 Acidosis: Secondary | ICD-10-CM | POA: Diagnosis present

## 2015-05-06 DIAGNOSIS — F17210 Nicotine dependence, cigarettes, uncomplicated: Secondary | ICD-10-CM | POA: Diagnosis present

## 2015-05-06 DIAGNOSIS — R0602 Shortness of breath: Secondary | ICD-10-CM | POA: Insufficient documentation

## 2015-05-06 DIAGNOSIS — E119 Type 2 diabetes mellitus without complications: Secondary | ICD-10-CM | POA: Diagnosis present

## 2015-05-06 DIAGNOSIS — F209 Schizophrenia, unspecified: Secondary | ICD-10-CM | POA: Diagnosis present

## 2015-05-06 DIAGNOSIS — J4489 Other specified chronic obstructive pulmonary disease: Secondary | ICD-10-CM | POA: Diagnosis present

## 2015-05-06 DIAGNOSIS — R112 Nausea with vomiting, unspecified: Secondary | ICD-10-CM | POA: Diagnosis present

## 2015-05-06 DIAGNOSIS — J449 Chronic obstructive pulmonary disease, unspecified: Secondary | ICD-10-CM | POA: Diagnosis present

## 2015-05-06 DIAGNOSIS — F259 Schizoaffective disorder, unspecified: Secondary | ICD-10-CM | POA: Diagnosis present

## 2015-05-06 DIAGNOSIS — B1910 Unspecified viral hepatitis B without hepatic coma: Secondary | ICD-10-CM | POA: Diagnosis present

## 2015-05-06 DIAGNOSIS — J45901 Unspecified asthma with (acute) exacerbation: Secondary | ICD-10-CM | POA: Diagnosis present

## 2015-05-06 DIAGNOSIS — R509 Fever, unspecified: Secondary | ICD-10-CM | POA: Diagnosis not present

## 2015-05-06 DIAGNOSIS — R0902 Hypoxemia: Secondary | ICD-10-CM | POA: Diagnosis not present

## 2015-05-06 DIAGNOSIS — J9601 Acute respiratory failure with hypoxia: Secondary | ICD-10-CM | POA: Diagnosis present

## 2015-05-06 DIAGNOSIS — J440 Chronic obstructive pulmonary disease with acute lower respiratory infection: Secondary | ICD-10-CM | POA: Diagnosis not present

## 2015-05-06 DIAGNOSIS — Z7984 Long term (current) use of oral hypoglycemic drugs: Secondary | ICD-10-CM | POA: Diagnosis not present

## 2015-05-06 DIAGNOSIS — J45909 Unspecified asthma, uncomplicated: Secondary | ICD-10-CM | POA: Diagnosis not present

## 2015-05-06 DIAGNOSIS — R0600 Dyspnea, unspecified: Secondary | ICD-10-CM

## 2015-05-06 DIAGNOSIS — K219 Gastro-esophageal reflux disease without esophagitis: Secondary | ICD-10-CM | POA: Diagnosis present

## 2015-05-06 DIAGNOSIS — B20 Human immunodeficiency virus [HIV] disease: Secondary | ICD-10-CM | POA: Diagnosis present

## 2015-05-06 DIAGNOSIS — F200 Paranoid schizophrenia: Secondary | ICD-10-CM | POA: Diagnosis present

## 2015-05-06 DIAGNOSIS — J209 Acute bronchitis, unspecified: Secondary | ICD-10-CM | POA: Diagnosis present

## 2015-05-06 DIAGNOSIS — J441 Chronic obstructive pulmonary disease with (acute) exacerbation: Secondary | ICD-10-CM | POA: Diagnosis present

## 2015-05-06 DIAGNOSIS — H269 Unspecified cataract: Secondary | ICD-10-CM | POA: Diagnosis present

## 2015-05-06 DIAGNOSIS — Z21 Asymptomatic human immunodeficiency virus [HIV] infection status: Secondary | ICD-10-CM

## 2015-05-06 DIAGNOSIS — J9612 Chronic respiratory failure with hypercapnia: Secondary | ICD-10-CM | POA: Insufficient documentation

## 2015-05-06 HISTORY — DX: Type 2 diabetes mellitus without complications: E11.9

## 2015-05-06 LAB — CBC WITH DIFFERENTIAL/PLATELET
Basophils Absolute: 0 10*3/uL (ref 0.0–0.1)
Basophils Relative: 0 %
Eosinophils Absolute: 0 10*3/uL (ref 0.0–0.7)
Eosinophils Relative: 0 %
HEMATOCRIT: 42.8 % (ref 36.0–46.0)
HEMOGLOBIN: 14.8 g/dL (ref 12.0–15.0)
LYMPHS ABS: 2.9 10*3/uL (ref 0.7–4.0)
LYMPHS PCT: 29 %
MCH: 29.4 pg (ref 26.0–34.0)
MCHC: 34.6 g/dL (ref 30.0–36.0)
MCV: 84.9 fL (ref 78.0–100.0)
MONOS PCT: 9 %
Monocytes Absolute: 0.9 10*3/uL (ref 0.1–1.0)
NEUTROS ABS: 6.2 10*3/uL (ref 1.7–7.7)
NEUTROS PCT: 62 %
Platelets: 267 10*3/uL (ref 150–400)
RBC: 5.04 MIL/uL (ref 3.87–5.11)
RDW: 16 % — ABNORMAL HIGH (ref 11.5–15.5)
WBC: 10 10*3/uL (ref 4.0–10.5)

## 2015-05-06 LAB — COMPREHENSIVE METABOLIC PANEL
ALK PHOS: 83 U/L (ref 38–126)
ALT: 15 U/L (ref 14–54)
AST: 22 U/L (ref 15–41)
Albumin: 3.6 g/dL (ref 3.5–5.0)
Anion gap: 11 (ref 5–15)
BILIRUBIN TOTAL: 0.6 mg/dL (ref 0.3–1.2)
BUN: 10 mg/dL (ref 6–20)
CALCIUM: 9.7 mg/dL (ref 8.9–10.3)
CO2: 32 mmol/L (ref 22–32)
CREATININE: 1.08 mg/dL — AB (ref 0.44–1.00)
Chloride: 86 mmol/L — ABNORMAL LOW (ref 101–111)
GFR, EST NON AFRICAN AMERICAN: 57 mL/min — AB (ref >60)
Glucose, Bld: 103 mg/dL — ABNORMAL HIGH (ref 65–99)
Potassium: 4.9 mmol/L (ref 3.5–5.1)
SODIUM: 129 mmol/L — AB (ref 135–145)
TOTAL PROTEIN: 7.2 g/dL (ref 6.5–8.1)

## 2015-05-06 LAB — GLUCOSE, CAPILLARY
GLUCOSE-CAPILLARY: 113 mg/dL — AB (ref 65–99)
Glucose-Capillary: 158 mg/dL — ABNORMAL HIGH (ref 65–99)
Glucose-Capillary: 173 mg/dL — ABNORMAL HIGH (ref 65–99)

## 2015-05-06 LAB — BRAIN NATRIURETIC PEPTIDE: B Natriuretic Peptide: 137.8 pg/mL — ABNORMAL HIGH (ref 0.0–100.0)

## 2015-05-06 LAB — CBG MONITORING, ED: Glucose-Capillary: 225 mg/dL — ABNORMAL HIGH (ref 65–99)

## 2015-05-06 LAB — I-STAT TROPONIN, ED: Troponin i, poc: 0.02 ng/mL (ref 0.00–0.08)

## 2015-05-06 MED ORDER — DARUNAVIR ETHANOLATE 800 MG PO TABS
800.0000 mg | ORAL_TABLET | Freq: Every day | ORAL | Status: DC
  Administered 2015-05-07 – 2015-05-11 (×5): 800 mg via ORAL
  Filled 2015-05-06 (×6): qty 1

## 2015-05-06 MED ORDER — EMTRICITABINE-TENOFOVIR AF 200-25 MG PO TABS
1.0000 | ORAL_TABLET | Freq: Every day | ORAL | Status: DC
  Administered 2015-05-07 – 2015-05-11 (×5): 1 via ORAL
  Filled 2015-05-06 (×7): qty 1

## 2015-05-06 MED ORDER — INSULIN ASPART 100 UNIT/ML ~~LOC~~ SOLN: 0.0000 [IU] | Freq: Every day | SUBCUTANEOUS | Status: DC

## 2015-05-06 MED ORDER — INSULIN ASPART 100 UNIT/ML ~~LOC~~ SOLN
0.0000 [IU] | Freq: Three times a day (TID) | SUBCUTANEOUS | Status: DC
  Administered 2015-05-06: 2 [IU] via SUBCUTANEOUS
  Administered 2015-05-06 – 2015-05-07 (×4): 1 [IU] via SUBCUTANEOUS
  Administered 2015-05-08 – 2015-05-09 (×2): 3 [IU] via SUBCUTANEOUS
  Administered 2015-05-09: 1 [IU] via SUBCUTANEOUS
  Administered 2015-05-10: 3 [IU] via SUBCUTANEOUS
  Administered 2015-05-11: 1 [IU] via SUBCUTANEOUS

## 2015-05-06 MED ORDER — PREDNISONE 50 MG PO TABS
50.0000 mg | ORAL_TABLET | Freq: Every day | ORAL | Status: DC
  Administered 2015-05-07 – 2015-05-11 (×5): 50 mg via ORAL
  Filled 2015-05-06: qty 1
  Filled 2015-05-06: qty 3
  Filled 2015-05-06 (×4): qty 1

## 2015-05-06 MED ORDER — ACETAMINOPHEN 325 MG PO TABS: 650.0000 mg | ORAL_TABLET | Freq: Four times a day (QID) | ORAL | Status: DC | PRN

## 2015-05-06 MED ORDER — ALBUTEROL (5 MG/ML) CONTINUOUS INHALATION SOLN
10.0000 mg/h | INHALATION_SOLUTION | RESPIRATORY_TRACT | Status: DC
  Administered 2015-05-06: 10 mg/h via RESPIRATORY_TRACT
  Filled 2015-05-06: qty 20

## 2015-05-06 MED ORDER — RISPERIDONE 2 MG PO TBDP
3.0000 mg | ORAL_TABLET | Freq: Two times a day (BID) | ORAL | Status: DC
  Administered 2015-05-06 – 2015-05-11 (×11): 3 mg via ORAL
  Filled 2015-05-06 (×13): qty 1

## 2015-05-06 MED ORDER — RITONAVIR 100 MG PO TABS
100.0000 mg | ORAL_TABLET | Freq: Every day | ORAL | Status: DC
  Administered 2015-05-07 – 2015-05-11 (×5): 100 mg via ORAL
  Filled 2015-05-06 (×5): qty 1

## 2015-05-06 MED ORDER — SODIUM CHLORIDE 0.9 % IV BOLUS (SEPSIS)
1000.0000 mL | Freq: Once | INTRAVENOUS | Status: AC
  Administered 2015-05-06: 1000 mL via INTRAVENOUS

## 2015-05-06 MED ORDER — SODIUM CHLORIDE 0.9% FLUSH
3.0000 mL | Freq: Two times a day (BID) | INTRAVENOUS | Status: DC
  Administered 2015-05-07 – 2015-05-10 (×7): 3 mL via INTRAVENOUS

## 2015-05-06 MED ORDER — DIVALPROEX SODIUM 125 MG PO CSDR
500.0000 mg | DELAYED_RELEASE_CAPSULE | Freq: Every day | ORAL | Status: DC
  Administered 2015-05-06 – 2015-05-07 (×2): 500 mg via ORAL
  Filled 2015-05-06 (×4): qty 4

## 2015-05-06 MED ORDER — LEVOFLOXACIN IN D5W 500 MG/100ML IV SOLN
500.0000 mg | INTRAVENOUS | Status: DC
  Administered 2015-05-06: 500 mg via INTRAVENOUS
  Filled 2015-05-06: qty 100

## 2015-05-06 MED ORDER — RALTEGRAVIR POTASSIUM 400 MG PO TABS
400.0000 mg | ORAL_TABLET | Freq: Two times a day (BID) | ORAL | Status: DC
  Administered 2015-05-06 – 2015-05-11 (×11): 400 mg via ORAL
  Filled 2015-05-06 (×16): qty 1

## 2015-05-06 MED ORDER — DOXYCYCLINE HYCLATE 100 MG PO TABS
100.0000 mg | ORAL_TABLET | Freq: Two times a day (BID) | ORAL | Status: AC
  Administered 2015-05-07 – 2015-05-08 (×4): 100 mg via ORAL
  Filled 2015-05-06 (×4): qty 1

## 2015-05-06 MED ORDER — ENOXAPARIN SODIUM 40 MG/0.4ML ~~LOC~~ SOLN
40.0000 mg | SUBCUTANEOUS | Status: DC
  Administered 2015-05-06 – 2015-05-10 (×5): 40 mg via SUBCUTANEOUS
  Filled 2015-05-06 (×5): qty 0.4

## 2015-05-06 MED ORDER — METHYLPREDNISOLONE SODIUM SUCC 125 MG IJ SOLR
125.0000 mg | Freq: Once | INTRAMUSCULAR | Status: AC
  Administered 2015-05-06: 125 mg via INTRAVENOUS
  Filled 2015-05-06: qty 2

## 2015-05-06 MED ORDER — ACETAMINOPHEN 650 MG RE SUPP
650.0000 mg | Freq: Four times a day (QID) | RECTAL | Status: DC | PRN
  Administered 2015-05-06: 650 mg via RECTAL
  Filled 2015-05-06: qty 1

## 2015-05-06 MED ORDER — ALBUTEROL SULFATE (2.5 MG/3ML) 0.083% IN NEBU: 2.5000 mg | INHALATION_SOLUTION | RESPIRATORY_TRACT | Status: DC | PRN

## 2015-05-06 MED ORDER — FAMOTIDINE IN NACL 20-0.9 MG/50ML-% IV SOLN
20.0000 mg | Freq: Two times a day (BID) | INTRAVENOUS | Status: DC
  Administered 2015-05-06 – 2015-05-09 (×6): 20 mg via INTRAVENOUS
  Filled 2015-05-06 (×8): qty 50

## 2015-05-06 MED ORDER — ONDANSETRON HCL 4 MG/2ML IJ SOLN
4.0000 mg | Freq: Four times a day (QID) | INTRAMUSCULAR | Status: DC | PRN
  Administered 2015-05-06 – 2015-05-08 (×4): 4 mg via INTRAVENOUS
  Filled 2015-05-06 (×4): qty 2

## 2015-05-06 NOTE — Progress Notes (Signed)
Patient restless and agitated. Wanting to eat but c/o nausea will not keep monitor leads on MD called for orders

## 2015-05-06 NOTE — ED Provider Notes (Signed)
CSN: [id number]     Arrival date & time 05/06/15  4098 History   First MD Initiated Contact with Patient 05/06/15 0825     Chief Complaint  Patient presents with  . Shortness of Breath     (Consider location/radiation/quality/duration/timing/severity/associated sxs/prior Treatment) HPI Patient was seen 3 days of ago for similar symptoms. She is an incredibly poor historian. States she had worsening shortness of breath this morning after smoking a cigarette. She's been coughing up white sputum. She uses oxygen at home though according to prior notes this is not prescribed to her. Wheezing noted by EMS and was given 10 mg of albuterol as well as 0.5 mg of Atrovent. Past Medical History  Diagnosis Date  . GERD (gastroesophageal reflux disease)   . Schizophrenia (HCC)   . AIDS (HCC) 12-10-2007  . Herpes simplex   . History of cholecystectomy   . Cataract   . Nonspecific reaction to tuberculin skin test without active tuberculosis 11-2008 [facility]   . Seizures (HCC)   . Hepatitis B infection     followed by ID  . Chronic asthmatic bronchitis (HCC)   . Tobacco use    Past Surgical History  Procedure Laterality Date  . Cholecystectomy    . Leg surgery Right     s/p accident; "steel fell on the back of my leg"  . Tonsillectomy     Family History  Problem Relation Age of Onset  . Diabetes Sister   . Cancer Sister     breast  . Diabetes Brother   . Hypertension Brother   . Cancer Mother     lung  . Hypertension Mother   . Heart disease Father    Social History  Substance Use Topics  . Smoking status: Current Every Day Smoker -- 0.50 packs/day for 35 years    Types: Cigarettes    Start date: 04/10/1974  . Smokeless tobacco: Never Used     Comment: Previously smoked 2ppd, might start patch  . Alcohol Use: No     Comment: Patient denies    OB History    No data available     Review of Systems  Constitutional: Negative for fever and chills.  Respiratory: Positive for cough,  shortness of breath and wheezing.   Cardiovascular: Negative for chest pain and leg swelling.  Gastrointestinal: Negative for nausea, vomiting and abdominal pain.  Skin: Negative for rash.  Neurological: Negative for weakness and numbness.  All other systems reviewed and are negative.     Allergies  Review of patient's allergies indicates no known allergies.  Home Medications   Prior to Admission medications   Medication Sig Start Date End Date Taking? Authorizing Provider  albuterol (PROVENTIL) (2.5 MG/3ML) 0.083% nebulizer solution INHALE CONTENTS OF 1 VIAL USING NEBULIZER EVERY 6 HOURS AS NEEDED FOR WHEEZING 05/03/15  Yes Christopher Lawyer, PA-C  divalproex (DEPAKOTE SPRINKLE) 125 MG capsule Take 4 capsules (500 mg total) by mouth at bedtime. 11/23/14  Yes Jolanta B Pucilowska, MD  emtricitabine-tenofovir (TRUVADA) 200-300 MG per tablet Take 1 tablet by mouth daily. 11/19/14  Yes Judyann Munson, MD  ISENTRESS 400 MG tablet TAKE ONE TABLET   BY MOUTH   TWICE A DAY 12/23/14  Yes Judyann Munson, MD  metFORMIN (GLUCOPHAGE) 500 MG tablet Take 1 tablet (500 mg total) by mouth 2 (two) times daily with a meal. 02/15/15  Yes [name], MD  paliperidone (INVEGA SUSTENNA) 234 MG/1.5ML SUSP injection Inject 234 mg into the muscle once. 12/18/14  Yes Jolanta B Pucilowska, MD  predniSONE (DELTASONE) 50 MG tablet Take 1 tablet (50 mg total) by mouth daily. 05/03/15  Yes Christopher Lawyer, PA-C  risperiDONE (RISPERDAL M-TABS) 3 MG disintegrating tablet Take 1 tablet (3 mg total) by mouth 2 (two) times daily. 11/23/14  Yes Jolanta B Pucilowska, MD  risperiDONE microspheres (RISPERDAL CONSTA) 50 MG injection Inject 50 mg into the muscle every 30 (thirty) days.    Yes Historical Provider, MD  budesonide-formoterol (SYMBICORT) 80-4.5 MCG/ACT inhaler Inhale 2 puffs into the lungs 2 (two) times daily. Patient not taking: Reported on 05/06/2015 12/19/14   [name], MD  Darunavir Ethanolate  (PREZISTA) 800 MG tablet Take 1 tablet (800 mg total) by mouth daily. Patient not taking: Reported on 05/06/2015 11/19/14   Judyann Munson, MD  polyethylene glycol [facility] / Ethelene Hal) packet Take 17 g by mouth daily as needed for mild constipation. Patient not taking: Reported on 05/06/2015 02/25/15   [name], MD  ranitidine (ZANTAC) 150 MG tablet Take 1 tablet (150 mg total) by mouth 2 (two) times daily. Patient not taking: Reported on 05/06/2015 04/23/15   [name], MD  ritonavir (NORVIR) 100 MG TABS tablet Take 1 tablet (100 mg total) by mouth daily. Patient not taking: Reported on 05/06/2015 11/19/14   Judyann Munson, MD   BP 109/80 mmHg  Pulse 116  Temp(Src) 98.4 F (36.9 C)  Resp 26  SpO2 86% Physical Exam  Constitutional: She is oriented to person, place, and time. She appears well-developed and well-nourished. No distress.  HENT:  Head: Normocephalic and atraumatic.  Mouth/Throat: Oropharynx is clear and moist. No oropharyngeal exudate.  Eyes: EOM are normal. Pupils are equal, round, and reactive to light.  Neck: Normal range of motion. Neck supple. JVD present.  Cardiovascular: Normal rate and regular rhythm.   Pulmonary/Chest: No respiratory distress. She has no wheezes. She has rales. She exhibits no tenderness.  Scattered rales. Decreased air movement throughout. Patient speaking in full sentences at times agitated.  Abdominal: Soft. Bowel sounds are normal. She exhibits no distension and no mass. There is no tenderness. There is no rebound and no guarding.  Musculoskeletal: Normal range of motion. She exhibits no edema or tenderness.  No lower extremity edema or tenderness.  Neurological: She is alert and oriented to person, place, and time.  Skin: Skin is warm and dry. No rash noted. No erythema.  Psychiatric: She has a normal mood and affect. Her behavior is normal.  Nursing note and vitals reviewed.   ED Course  Procedures (including critical care  time) Labs Review Labs Reviewed  BRAIN NATRIURETIC PEPTIDE - Abnormal; Notable for the following:    B Natriuretic Peptide 137.8 (*)    All other components within normal limits  CBC WITH DIFFERENTIAL/PLATELET - Abnormal; Notable for the following:    RDW 16.0 (*)    All other components within normal limits  COMPREHENSIVE METABOLIC PANEL - Abnormal; Notable for the following:    Sodium 129 (*)    Chloride 86 (*)    Glucose, Bld 103 (*)    Creatinine, Ser 1.08 (*)    GFR calc non Af Amer 57 (*)    All other components within normal limits  BLOOD GAS, ARTERIAL  I-STAT TROPOININ, ED    Imaging Review Dg Chest Port 1 View  05/06/2015  CLINICAL DATA:  Cough for 1 week EXAM: PORTABLE CHEST 1 VIEW COMPARISON:  05/03/2015 FINDINGS: The heart size and mediastinal contours are within normal limits.  Both lungs are clear. The visualized skeletal structures are unremarkable. IMPRESSION: No active disease. Electronically Signed   By: Elige Ko   On: 05/06/2015 09:01   I have personally reviewed and evaluated these images and lab results as part of my medical decision-making.   EKG Interpretation   Date/Time:  Thursday May 06 2015 08:21:02 EST Ventricular Rate:  104 PR Interval:  162 QRS Duration: 69 QT Interval:  311 QTC Calculation: 409 R Axis:   86 Text Interpretation:  Sinus tachycardia Minimal ST elevation, inferior  leads Confirmed by Ranae Palms  MD, Dimple Bastyr ([id number]) on 05/06/2015 11:10:23 AM      MDM   Final diagnoses:  COPD exacerbation (HCC)  Hypoxia   patient has improved breath sounds. Remains hypoxic while speaking. She is admitted in no distress. Patient states she does not have oxygen that she uses at home. Chest x-ray is normal. Discussed with family medicine resident, will admit to telemetry bed.      Loren Racer, MD 05/06/15 1110

## 2015-05-06 NOTE — ED Notes (Signed)
DR. McDiarmid made aware of patient's vomiting x2 and rectal temp of 101.9, also made aware that patient was given  tylenol suppository per the PRN admission orders that patient had. Dr. McDiarmid advised this RN to page the family med resident to let them know about situation as well. This RN had the ED secretary page physician on call

## 2015-05-06 NOTE — ED Notes (Signed)
MD at bedside. 

## 2015-05-06 NOTE — ED Notes (Signed)
Patient comes from . Per EMS patient was smoking a Cigarette and started complain of SOB. Patient found to have wheezing in all fields. Patient also coughing white sputum.  Patient alert and Oriented x4 on arrival. Patient given 10 albuterol with EMS and 0.5 Atrovent, Wheezing noted on arrival.

## 2015-05-06 NOTE — ED Notes (Signed)
PT asleep at this time.  

## 2015-05-06 NOTE — ED Notes (Signed)
This RN had admitting physician paged in regards to patient's fever and vomiting.

## 2015-05-06 NOTE — Consult Note (Signed)
[facility]       Reason for Consult:HIV    Referring Physician: Dr. McDiarmid  Principal Problem:   Asthmatic bronchitis , chronic (HCC) Active Problems:   COPD exacerbation (HCC)   . divalproex  500 mg Oral QHS  . enoxaparin (LOVENOX) injection  40 mg Subcutaneous Q24H  . famotidine (PEPCID) IV  20 mg Intravenous Q12H  . insulin aspart  0-5 Units Subcutaneous QHS  . insulin aspart  0-9 Units Subcutaneous TID WC  . levofloxacin (LEVAQUIN) IV  500 mg Intravenous Q24H  . predniSONE  50 mg Oral Daily  . risperiDONE  3 mg Oral BID  . 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  No fluoroquinolone indicated.  Fever noted but no increased WBC, no opacity on CXR  Scattered bilateral ground-glass opacities noted on CT in August.    Antibiotics: levaquin  HPI: [name] is a 55 y.o. female with HIV with suppressed viral load and last CD4 of 610 and chronic asthmatic bronchitis, continued tobacco abuse, who came to ED with DOE, wheezing c/w COPD exacerbation.  Had a fever here but no opacity on CXR.  Hypoxic initially but responding to respiratory treatments.   CXR and CT independently reviewed. Non specific findings noted on Ct. Ran out of home nebulizer treatments.  No chills or fever noted at home.  No sick contacts.    Review of Systems:  Constitutional: negative for malaise Respiratory: negative for hemoptysis Gastrointestinal: negative for nausea and diarrhea All other systems reviewed and are negative    Past Medical History  Diagnosis Date  . GERD (gastroesophageal reflux disease)   . Schizophrenia (HCC)   . AIDS (HCC) 12-10-2007  . Herpes simplex   . History of cholecystectomy   . Cataract   . Nonspecific reaction to tuberculin skin test without active tuberculosis 11-2008 [facility]   . Seizures (HCC)   . Hepatitis B infection     followed by ID  . Chronic asthmatic bronchitis (HCC)   . Tobacco use   . Diabetes mellitus without complication (HCC)     type 2 with out complication    Social History  Substance Use Topics  . Smoking status: Current Every Day Smoker -- 0.50 packs/day for 35 years    Types: Cigarettes    Start date: 04/10/1974  . Smokeless tobacco: Never Used     Comment: Previously smoked 2ppd, might start patch  . Alcohol Use: No     Comment: Patient denies     Family History  Problem Relation Age of Onset  . Diabetes Sister   . Cancer Sister     breast  . Diabetes Brother   . Hypertension Brother   . Cancer Mother     lung  . Hypertension Mother   . Heart disease Father     No Known Allergies  Physical Exam: Constitutional: in no apparent distress and alert  Filed Vitals:   05/06/15 1529 05/06/15 1535  BP:    Pulse:  94  Temp:  99.9 F (37.7 C)   Resp:  17   EYES: anicteric ENMT: thrush Cardiovascular: Cor Tachy and No murmurs Respiratory: diffuse wheezing bilateral; normal respiratory effort GI: Bowel sounds are normal, liver is not enlarged, spleen is not enlarged Musculoskeletal: no pedal edema noted Skin: negatives: no rash Hematologic: no cervical lad  Lab Results  Component Value Date   WBC 10.0 05/06/2015   HGB 14.8 05/06/2015   HCT 42.8 05/06/2015   MCV 84.9 05/06/2015   PLT 267 05/06/2015    Lab Results  Component Value Date   CREATININE 1.08* 05/06/2015   BUN 10 05/06/2015   NA 129* 05/06/2015   K 4.9 05/06/2015   CL 86* 05/06/2015   CO2 32 05/06/2015    Lab Results  Component Value Date   ALT 15 05/06/2015   AST  22 05/06/2015   ALKPHOS 83 05/06/2015     Microbiology: No results found for this or any previous visit (from the past 240 hour(s)).  Staci Righter, MD [email or web address] [phone] pager  [phone] cell 05/06/2015, 5:28 PM

## 2015-05-06 NOTE — ED Notes (Signed)
CBG 158  

## 2015-05-06 NOTE — ED Notes (Signed)
PT has vomited and gone back to sleep with vomit pooling around face. PT is laying on her side and vomit is not thick enough to impede her mouth or nose. PT wakes up to verbal stimuli and is cleaned and clean sheets applied to bed. Levaquin paused and disconnected, IV flushed with 20 cc NS, zofran given, IV flushed with 20 cc NS, Levaquin reconnected and restarted.

## 2015-05-06 NOTE — ED Notes (Signed)
Patient yelling out that she is hungry, this RN in room to talk to patient, patient started yelling at this RN and slinging her arms stating "Don't hook me up to any of this stuff, I am hungry, I want to go home!" This RN advised patient that we have to monitor her while she is here and that yelling at the staff is not appropriate

## 2015-05-06 NOTE — H&P (Signed)
Family Medicine Teaching [facility] Admission History and Physical Service Pager: [phone]  Patient name: [name] Medical record number: [id number] Date of birth: March 19, 1961 Age: 55 y.o. Gender: female  Primary Care Provider: Levert Feinstein, MD Consultants: ID Code Status: full  Chief Complaint: dyspnea  Assessment and Plan: [name] is a 55 y.o. female presenting with dyspnea. PMH is significant for COPD, HIV, schizophrenia.  COPD exacerbation: dyspnea, cough, sputum increased x1 week. No fevers. Hypoxic in ED, wheezes improved with albuterol. CTA chest in August showed ground glass opacities and granulomas. Likely pure COPD exacerbation but some concern for fundal or other infectious etiology given HIV and CT findings - admit to inpatient, attending Dr. McDiarmid - s/p solumedrol IV, switch to prednisone as pt tolerating PO - duonebs q4, albuterol q2 prn - desat to 80% while talking on 6L [place], continue supplemental O2 prn - ID consulted - appreciate recs  DM2: A1c 6.7 in August, on metformin alone - hold metformin while inpatient - sensitive SSI  HIV: CD4 610, RNA quant 39 in November. 5 meds on list and unclear which she has been taking - consult ID for continuation of HAART  Schizophrenia:  - continue home risperdal, depakote   FEN/GI: carb mod diet, SLIV Prophylaxis: lovenox  Disposition: admit to inpatient, tele floor  History of Present Illness:  [name] is a 55 y.o. female presenting with dypnea  Pt reports 1 week of cough, productive of white sputum, and worsening dyspnea. Has been using her brother's oxygen for the dyspnea.   During my exam she became tearful and complained of severe abdominal pain but then seemed to forget about it and denied having any abdominal pain at any point.  History somewhat limited by patient cooperation.  Review Of Systems: Per HPI with the following additions: No fever, chest pain, nausea, vomiting, diarrhea,  weight loss. Otherwise the remainder of the systems were negative.  Patient Active Problem List   Diagnosis Date Noted  . COPD exacerbation (HCC) 05/06/2015  . Acute respiratory distress (HCC)   . Encounter for preadmission testing   . Diabetes (HCC) 12/01/2014  . Homicidal ideation   . Lethargy   . Wheezing   . Delusional disorder (HCC)   . Asthma, chronic   . HIV disease (HCC)   . Paranoid schizophrenia (HCC)   . Psychosis 10/30/2014  . Psychoses   . Hyponatremia 10/22/2014  . Microscopic hematuria 01/29/2014  . Abdominal pain 12/12/2013  . Vulvar irritation 12/12/2013  . Tobacco use disorder 02/18/2013  . Hepatitis B infection   . Pre-ulcerative corn or callous 05/07/2012  . Urinary incontinence, nocturnal enuresis 05/07/2012  . Fecal incontinence 05/07/2012  . Systolic murmur 05/07/2012  . Routine health maintenance 05/07/2012  . Asthma 08/18/2010  . HIV (human immunodeficiency virus infection) (HCC) 08/18/2010  . Schizophrenia (HCC) 08/18/2010  . Herpes simplex infection 08/18/2010    Past Medical History: Past Medical History  Diagnosis Date  . GERD (gastroesophageal reflux disease)   . Schizophrenia (HCC)   . AIDS (HCC) 12-10-2007  . Herpes simplex   . History of cholecystectomy   . Cataract   . Nonspecific reaction to tuberculin skin test without active tuberculosis 11-2008 [facility]   . Seizures (HCC)   . Hepatitis B infection     followed by ID  . Chronic asthmatic bronchitis (HCC)   . Tobacco use     Past Surgical History: Past Surgical History  Procedure Laterality Date  . Cholecystectomy    .  Leg surgery Right     s/p accident; "steel fell on the back of my leg"  . Tonsillectomy      Social History: Social History  Substance Use Topics  . Smoking status: Current Every Day Smoker -- 0.50 packs/day for 35 years    Types: Cigarettes    Start date: 04/10/1974  . Smokeless tobacco: Never Used     Comment: Previously smoked 2ppd, might start patch   . Alcohol Use: No     Comment: Patient denies    Please also refer to relevant sections of EMR.  Family History: Family History  Problem Relation Age of Onset  . Diabetes Sister   . Cancer Sister     breast  . Diabetes Brother   . Hypertension Brother   . Cancer Mother     lung  . Hypertension Mother   . Heart disease Father     Allergies and Medications: No Known Allergies No current facility-administered medications on file prior to encounter.   Current Outpatient Prescriptions on File Prior to Encounter  Medication Sig Dispense Refill  . albuterol (PROVENTIL) (2.5 MG/3ML) 0.083% nebulizer solution INHALE CONTENTS OF 1 VIAL USING NEBULIZER EVERY 6 HOURS AS NEEDED FOR WHEEZING 75 mL 5  . divalproex (DEPAKOTE SPRINKLE) 125 MG capsule Take 4 capsules (500 mg total) by mouth at bedtime. 120 capsule 0  . emtricitabine-tenofovir (TRUVADA) 200-300 MG per tablet Take 1 tablet by mouth daily. 30 tablet 5  . ISENTRESS 400 MG tablet TAKE ONE TABLET   BY MOUTH   TWICE A DAY 60 tablet 5  . metFORMIN (GLUCOPHAGE) 500 MG tablet Take 1 tablet (500 mg total) by mouth 2 (two) times daily with a meal. 180 tablet 1  . paliperidone (INVEGA SUSTENNA) 234 MG/1.5ML SUSP injection Inject 234 mg into the muscle once. 0.9 mL 0  . predniSONE (DELTASONE) 50 MG tablet Take 1 tablet (50 mg total) by mouth daily. 5 tablet 0  . risperiDONE (RISPERDAL M-TABS) 3 MG disintegrating tablet Take 1 tablet (3 mg total) by mouth 2 (two) times daily. 60 tablet 0  . risperiDONE microspheres (RISPERDAL CONSTA) 50 MG injection Inject 50 mg into the muscle every 30 (thirty) days.     . budesonide-formoterol (SYMBICORT) 80-4.5 MCG/ACT inhaler Inhale 2 puffs into the lungs 2 (two) times daily. (Patient not taking: Reported on 05/06/2015) 1 Inhaler 0  . Darunavir Ethanolate (PREZISTA) 800 MG tablet Take 1 tablet (800 mg total) by mouth daily. (Patient not taking: Reported on 05/06/2015) 30 tablet 5  . polyethylene glycol  (MIRALAX / GLYCOLAX) packet Take 17 g by mouth daily as needed for mild constipation. (Patient not taking: Reported on 05/06/2015) 30 each 5  . ranitidine (ZANTAC) 150 MG tablet Take 1 tablet (150 mg total) by mouth 2 (two) times daily. (Patient not taking: Reported on 05/06/2015) 60 tablet 2  . ritonavir (NORVIR) 100 MG TABS tablet Take 1 tablet (100 mg total) by mouth daily. (Patient not taking: Reported on 05/06/2015) 30 tablet 5    Objective: BP 109/80 mmHg  Pulse 116  Temp(Src) 98.4 F (36.9 C)  Resp 26  SpO2 86% Exam: General: AAF, appears older than stated age, slumped over in stretcher asleep Eyes: PERRL, EOMI ENTM: MMM, oropharynx clear, no LAD Cardiovascular: RRR, no MRG Respiratory: somewhat tight but no wheezes, prolonged expiratory phase Abdomen: soft, NTND, +BS MSK: moving all extremities Skin: no rashes or lesions Neuro: alert and oriented, no focal deficits Psych: normal affect, erratic behavior  Labs  and Imaging: CBC BMET   Recent Labs Lab 05/06/15 0905  WBC 10.0  HGB 14.8  HCT 42.8  PLT 267    Recent Labs Lab 05/06/15 0905  NA 129*  K 4.9  CL 86*  CO2 32  BUN 10  CREATININE 1.08*  GLUCOSE 103*  CALCIUM 9.7     Dg Chest 2 View  05/03/2015  CLINICAL DATA:  Yellowish Productive cough Sob X 1 week HX COPD, 2 Pack per day smoker, diabetes EXAM: CHEST  2 VIEW COMPARISON:  12/17/2014 FINDINGS: Lungs are hyperinflated. Heart is enlarged. Prominence of interstitial markings appears stable compared prior study. Perihilar peribronchial thickening. There are no focal consolidations. No pleural effusions. Calcified granulomata again noted at the bases. IMPRESSION: 1. Bronchitic changes. 2.  No focal acute pulmonary abnormality. Electronically Signed   By: Norva Pavlov M.D.   On: 05/03/2015 20:08   Dg Chest Port 1 View  05/06/2015  CLINICAL DATA:  Cough for 1 week EXAM: PORTABLE CHEST 1 VIEW COMPARISON:  05/03/2015 FINDINGS: The heart size and mediastinal  contours are within normal limits. Both lungs are clear. The visualized skeletal structures are unremarkable. IMPRESSION: No active disease. Electronically Signed   By: Elige Ko   On: 05/06/2015 09:01    Abram Sander, MD 05/06/2015, 11:10 AM PGY-3, [facility] FPTS Intern pager: [phone], text pages welcome

## 2015-05-06 NOTE — Progress Notes (Signed)
FPTS Interim Progress Note  Stopped by to assess patient.  Patient on phone ordering dinner.  Patient reports that breathing is ok.  She endorses generalized abdominal pain.  She denies nausea.  Reports 1 episode of vomiting in ED.    O: BP 101/63 mmHg  Pulse 94  Temp(Src) 99.9 F (37.7 C) (Rectal)  Resp 17  SpO2 98%  Gen: drowsy appearing female, NAD Pulm: globally decreased breath sounds.  RLL with mild crackles. Abdomen: soft, no increased TTP but patient reports discomfort throughout, +BS Neuro: responds to questions  A/P: [name] is a 55 y.o. female admitted for CAP.  She had 1 episode of vomiting in ED.  No evidence of acute abdomen.  Breathing well on [place]. - Zofran prn nausea/vomiting  - Consider imaging if worsening abd pain/ nausea/ vomiting - IV abx for pna - Monitor respiratory status  Raliegh Ip, DO 05/06/2015, 4:22 PM PGY-2, [facility] Service pager [phone]

## 2015-05-06 NOTE — ED Notes (Signed)
Family Med resident made aware of 2 episodes of vomiting, rectal temp of 101.9 and administration of tylenol 650 rectally.

## 2015-05-06 NOTE — ED Notes (Signed)
PT is asleep at this time 

## 2015-05-06 NOTE — Progress Notes (Signed)
RT tried to obtain an ABG and was not successful. Pt yelled and jerked from RT during stick. RN notified.

## 2015-05-07 DIAGNOSIS — R0600 Dyspnea, unspecified: Secondary | ICD-10-CM

## 2015-05-07 DIAGNOSIS — J9612 Chronic respiratory failure with hypercapnia: Secondary | ICD-10-CM | POA: Insufficient documentation

## 2015-05-07 DIAGNOSIS — R0602 Shortness of breath: Secondary | ICD-10-CM | POA: Insufficient documentation

## 2015-05-07 DIAGNOSIS — R0902 Hypoxemia: Secondary | ICD-10-CM | POA: Insufficient documentation

## 2015-05-07 LAB — BASIC METABOLIC PANEL
Anion gap: 11 (ref 5–15)
BUN: 17 mg/dL (ref 6–20)
CALCIUM: 8.9 mg/dL (ref 8.9–10.3)
CHLORIDE: 95 mmol/L — AB (ref 101–111)
CO2: 28 mmol/L (ref 22–32)
CREATININE: 1.16 mg/dL — AB (ref 0.44–1.00)
GFR calc non Af Amer: 52 mL/min — ABNORMAL LOW (ref >60)
Glucose, Bld: 126 mg/dL — ABNORMAL HIGH (ref 65–99)
Potassium: 5 mmol/L (ref 3.5–5.1)
SODIUM: 134 mmol/L — AB (ref 135–145)

## 2015-05-07 LAB — BLOOD GAS, ARTERIAL
Acid-Base Excess: 8.5 mmol/L — ABNORMAL HIGH (ref 0.0–2.0)
Bicarbonate: 33.7 mEq/L — ABNORMAL HIGH (ref 20.0–24.0)
DRAWN BY: 44898
O2 CONTENT: 5 L/min
O2 Saturation: 94.3 %
PCO2 ART: 57.9 mmHg — AB (ref 35.0–45.0)
PH ART: 7.382 (ref 7.350–7.450)
Patient temperature: 98.6
TCO2: 35.4 mmol/L (ref 0–100)
pO2, Arterial: 74.7 mmHg — ABNORMAL LOW (ref 80.0–100.0)

## 2015-05-07 LAB — CBC
HCT: 41.6 % (ref 36.0–46.0)
Hemoglobin: 13.5 g/dL (ref 12.0–15.0)
MCH: 28.2 pg (ref 26.0–34.0)
MCHC: 32.5 g/dL (ref 30.0–36.0)
MCV: 87 fL (ref 78.0–100.0)
PLATELETS: 265 10*3/uL (ref 150–400)
RBC: 4.78 MIL/uL (ref 3.87–5.11)
RDW: 16.5 % — AB (ref 11.5–15.5)
WBC: 10.1 10*3/uL (ref 4.0–10.5)

## 2015-05-07 LAB — GLUCOSE, CAPILLARY
GLUCOSE-CAPILLARY: 136 mg/dL — AB (ref 65–99)
GLUCOSE-CAPILLARY: 124 mg/dL — AB (ref 65–99)
Glucose-Capillary: 127 mg/dL — ABNORMAL HIGH (ref 65–99)
Glucose-Capillary: 127 mg/dL — ABNORMAL HIGH (ref 65–99)

## 2015-05-07 LAB — VALPROIC ACID LEVEL: Valproic Acid Lvl: 52 ug/mL (ref 50.0–100.0)

## 2015-05-07 LAB — HEMOGLOBIN A1C
HEMOGLOBIN A1C: 6.5 % — AB (ref 4.8–5.6)
MEAN PLASMA GLUCOSE: 140 mg/dL

## 2015-05-07 MED ORDER — IPRATROPIUM-ALBUTEROL 0.5-2.5 (3) MG/3ML IN SOLN
3.0000 mL | RESPIRATORY_TRACT | Status: DC
  Administered 2015-05-07 – 2015-05-09 (×11): 3 mL via RESPIRATORY_TRACT
  Filled 2015-05-07 (×11): qty 3

## 2015-05-07 MED ORDER — MOMETASONE FURO-FORMOTEROL FUM 200-5 MCG/ACT IN AERO
2.0000 | INHALATION_SPRAY | Freq: Two times a day (BID) | RESPIRATORY_TRACT | Status: DC
  Administered 2015-05-07 – 2015-05-10 (×5): 2 via RESPIRATORY_TRACT
  Filled 2015-05-07: qty 8.8

## 2015-05-07 NOTE — Progress Notes (Signed)
Family Medicine Teaching Service Daily Progress Note Intern Pager: [phone]  Patient name: [name] Medical record number: [id number] Date of birth: 12-Dec-1960 Age: 55 y.o. Gender: female  Primary Care Provider: Levert Feinstein, MD Consultants: ID Code Status: Full  Pt Overview and Major Events to Date:  1/26-admitted with Dyspnea/cough/hypoxia  Assessment and Plan: [name] is a 55 y.o. female presenting with dyspnea. PMH is significant for COPD, HIV, schizophrenia.  Dyspnea/cough/Hypoxia: improved. Had increased sputum for one week. No fevers. CTA chest in August showed ground glass opacities and granulomas. 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 Patient with HIV. Doubt opportunistic infection with CD4 count 610 in 02/2015. S/p solumedrol IV and CAT albuterol in ED for presumed COPD exacerbation. BNP 138 (63 in 12/2014). Her exam remarkable for insp and exp wheeze no crackles. Her face appear puffy.  - CTA today - Appreciate ID recs:  -Continue HIV meds as below  -Stop quinolones and use doxy - Prednisone 1/27> - start duonebs q4h,  - albuterol q2 prn - O2 as needed  - F/u UDS - ABG - Consider lasix after CTA   DM2: A1c 6.7 in August, on metformin alone. CBG within normal range. - hold metformin while inpatient - sensitive SSI  HIV: CD4 610, RNA quant 39 in November.  - Appreicate ID recs  -Continue ARVs - prezista, norvir, isentress and Descovy in place of Truvada which is on our formulary here  Schizophrenia:  - Continue home risperdal, depakote - Depakote level  FEN/GI:  -carb mod diet,  -SLIV Prophylaxis: lovenox  Disposition: admit to inpatient, tele floor  Subjective:  Sleeping when I walked in but easily arousable. Denies chest pain, shortness of breath. She still have some cough with whitish phlegm. Desated to 80's per RN when she was sleeping but not recorded. She is on 5L now.  Objective: Temp:  [98.2 F (36.8 C)-101.9 F (38.8 C)] 98.5 F (36.9  C) (01/27 0453) Pulse Rate:  [74-116] 74 (01/27 0453) Resp:  [14-28] 16 (01/27 0453) BP: (90-124)/(56-87) 109/63 mmHg (01/27 0453) SpO2:  [86 %-100 %] 96 % (01/27 0453) FiO2 (%):  [3 %-4 %] 3 % (01/26 0909) Physical Exam: Gen: lying in bed with Chillum in place, sleepy but arousable, able to sit up in bed briefly for lung exam  Face: appears puffy CV: regular rate and rythm. S1 & S2 audible Resp: on 5L by [place], no apparent work of breathing, some end inspiratory and expiratory wheeze. GI: bowel sounds normal, no tenderness to palpation Laboratory:  Recent Labs Lab 05/03/15 1631 05/06/15 0905  WBC 7.3 10.0  HGB 14.4 14.8  HCT 42.3 42.8  PLT 242 267    Recent Labs Lab 05/03/15 1631 05/06/15 0905  NA 126* 129*  K 4.9 4.9  CL 89* 86*  CO2 25 32  BUN <5* 10  CREATININE 1.01* 1.08*  CALCIUM 8.8* 9.7  PROT  --  7.2  BILITOT  --  0.6  ALKPHOS  --  83  ALT  --  15  AST  --  22  GLUCOSE 108* 103*    Imaging/Diagnostic Tests: No results found.  Almon Hercules, MD 05/07/2015, 7:13 AM PGY-1, [facility] Family Medicine FPTS Intern pager: [phone], text pages welcome

## 2015-05-07 NOTE — Progress Notes (Signed)
Patient refused labs this morning.  Notified MD on-call.

## 2015-05-07 NOTE — Progress Notes (Signed)
Notified MD of ABG results

## 2015-05-07 NOTE — Progress Notes (Signed)
Pt sats low 90s on 6 L. Able to answer orientation questions appropriately, but is lethargic and fidgety. MD notified. Will continue to monitor.

## 2015-05-07 NOTE — Progress Notes (Signed)
Utilization Review Completed.[name] T1/27/2017  

## 2015-05-07 NOTE — Consult Note (Addendum)
Name: [name] MRN: [id number] DOB: 11/20/1960    ADMISSION DATE:  05/06/2015   CONSULTATION DATE:  05/07/2015  REFERRING MD : Family medicine  Reason for consult:  Persistent dyspnea despite treatment  BRIEF PATIENT DESCRIPTION: 55 yo AA female with a h/o chronic asthmatic bronchitis, schizophrenia, diabetes and HIV infection admitted with worsening dyspnea and  productive cough x 1 week.   SIGNIFICANT EVENTS  01/23: ED with SOB; treated with nebulizer;  refused admission and IV fluids; left AMA 01/26: ED with SOB, cough and hypoxia  STUDIES:  2 D echo 01/27> CTA chest 01/27>  HISTORY OF PRESENT ILLNESS:  [name] is a 55 yo female with a h/o chronic asthmatic bronchitis, schizophrenia, diabetes and HIV infection who presented on 01/26 with worsening dyspnea and  productive cough x 1 week. She states that it all started with a cough that was productive of yellow sputum followed by worsening dyspnea. She is on albuterol at home and reports using it daily. She also reports using her brother's oxygen. Upon admission she was started on po prednisone, duonebs and doxycycline. She reports improvement in symptoms. She denies fever, chills, nausea, vomiting and diarrhea. Her SpO2 is stable at rest but she desaturates with activity. PCCM was consulted to assist with patient's care. She was last seen by Dr. Craige Cotta in October 2016 for chronic asthmatic bronchitis. Her PFTs at the time suggested mild restrictive lung disease (PFT 05/13/14 >> FEV1 1.29 (72%), FEV1% 85, TLC 2.96 (68%), DLCO 70%, no BD). She was encouraged to stop smoking but patient continues to smoke. She was placed on Symbicort and prn albuterol Her last CD4 count was in November 2016 and was 610. She reports adhering to her antiretroviral therapy.  Her last CT chest was in August 2016 and showed scattered bilateral predominantly upper lobe ground-glass pulmonary opacities.   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  has past surgical history that includes Cholecystectomy; Leg Surgery (Right); and Tonsillectomy. Prior to Admission medications   Medication Sig Start Date End Date Taking? Authorizing Provider  albuterol (PROVENTIL) (2.5 MG/3ML) 0.083% nebulizer solution INHALE CONTENTS OF 1 VIAL USING NEBULIZER EVERY 6 HOURS AS NEEDED FOR WHEEZING 05/03/15  Yes Christopher Lawyer, PA-C  divalproex (DEPAKOTE SPRINKLE) 125 MG capsule Take 4 capsules (500 mg total) by mouth at bedtime. 11/23/14  Yes Jolanta B Pucilowska, MD  emtricitabine-tenofovir (TRUVADA) 200-300 MG per tablet Take 1 tablet by mouth daily. 11/19/14  Yes Judyann Munson, MD  ISENTRESS 400 MG tablet TAKE ONE TABLET   BY MOUTH   TWICE A DAY 12/23/14  Yes Judyann Munson, MD  metFORMIN (GLUCOPHAGE) 500 MG tablet Take 1 tablet (500 mg total) by mouth 2 (two) times daily with a meal. 02/15/15  Yes [name], MD  paliperidone (INVEGA SUSTENNA) 234 MG/1.5ML SUSP injection Inject 234 mg into the muscle once. 12/18/14  Yes Jolanta B Pucilowska, MD  predniSONE (DELTASONE) 50 MG tablet Take 1 tablet (50 mg total) by mouth daily. 05/03/15  Yes Christopher Lawyer, PA-C  risperiDONE (RISPERDAL M-TABS) 3 MG disintegrating tablet Take 1 tablet (3 mg total) by mouth 2 (two) times daily. 11/23/14  Yes Shari Prows, MD  risperiDONE microspheres (RISPERDAL  CONSTA) 50 MG injection Inject 50 mg into the muscle every 30 (thirty) days.    Yes Historical Provider, MD  budesonide-formoterol (SYMBICORT) 80-4.5 MCG/ACT inhaler Inhale 2 puffs into the lungs 2 (two) times daily. Patient not taking: Reported on  05/06/2015 12/19/14   [name], MD  Darunavir Ethanolate (PREZISTA) 800 MG tablet Take 1 tablet (800 mg total) by mouth daily. Patient not taking: Reported on 05/06/2015 11/19/14   Judyann Munson, MD  polyethylene glycol [facility] Ethelene Hal) packet Take 17 g by mouth daily as needed for mild constipation. Patient not taking: Reported on 05/06/2015 02/25/15   [name], MD  ranitidine (ZANTAC) 150 MG tablet Take 1 tablet (150 mg total) by mouth 2 (two) times daily. Patient not taking: Reported on 05/06/2015 04/23/15   [name], MD  ritonavir (NORVIR) 100 MG TABS tablet Take 1 tablet (100 mg total) by mouth daily. Patient not taking: Reported on 05/06/2015 11/19/14   Judyann Munson, MD   No Known Allergies  FAMILY HISTORY:  family history includes Cancer in her mother and sister; Diabetes in her brother and sister; Heart disease in her father; Hypertension in her brother and mother. SOCIAL HISTORY:  reports that she has been smoking Cigarettes.  She started smoking about 41 years ago. She has a 17.5 pack-year smoking history. She has never used smokeless tobacco. She reports that she does not drink alcohol or use illicit drugs.  REVIEW OF SYSTEMS:   Constitutional: Negative for fever and chills.  HENT: Negative for congestion and rhinorrhea.  Eyes: Negative for redness and visual disturbance.  Respiratory: Positive for shortness of breath and wheezing.  Cardiovascular: Positive for chest pain mostly when coughing but negative for palpitations.  Gastrointestinal: Negative  for nausea , vomiting and abdominal pain and loose stools Genitourinary: Negative for dysuria and urgency.  Musculoskeletal: Negative for myalgias and arthralgias.  Skin: Negative for pallor and wound.  Neurological: Negative for dizziness and headaches   SUBJECTIVE:   VITAL SIGNS: Temp:  [98.2 F (36.8 C)-98.5 F (36.9 C)] 98.5 F (36.9 C) (01/27 0453) Pulse Rate:  [74-88] 74 (01/27  0453) Resp:  [16-20] 16 (01/27 0453) BP: (109-122)/(63-75) 109/63 mmHg (01/27 0453) SpO2:  [93 %-100 %] 100 % (01/27 1510)  PHYSICAL EXAMINATION: General: Chronically ill-looking Neuro:  AAOX 2; speech is normal, moves all extremities HEENT: PERRLA Cardiovascular: RRR, S1/S2; no edema Lungs: Normal WOB, bilateral airflow; expiratory wheezes, breath sounds diminished in the bases Abdomen: soft, NT/ND, normal BS Musculoskeletal:  No obvious deformities Skin:  Warm; no rash   Recent Labs Lab 05/03/15 1631 05/06/15 0905  NA 126* 129*  K 4.9 4.9  CL 89* 86*  CO2 25 32  BUN <5* 10  CREATININE 1.01* 1.08*  GLUCOSE 108* 103*    Recent Labs Lab 05/03/15 1631 05/06/15 0905 05/07/15 1536  HGB 14.4 14.8 13.5  HCT 42.3 42.8 41.6  WBC 7.3 10.0 10.1  PLT 242 267 265   Dg Chest Port 1 View  05/06/2015  CLINICAL DATA:  Cough for 1 week EXAM: PORTABLE CHEST 1 VIEW COMPARISON:  05/03/2015 FINDINGS: The heart size and mediastinal contours are within normal limits. Both lungs are clear. The visualized skeletal structures are unremarkable. IMPRESSION: No active disease. Electronically Signed   By: Elige Ko   On: 05/06/2015 09:01   Discussion 55 YO female presenting with acute respiratory distress, hypoxemia and  productive cough. It is unclear why she desaturates with activity but this seems to be  more related to an acute exacerbation of her chronic asthmatic bronchitis due to medication non-adherence. She doesn't seem to have an acute infective process that would be contributing to her symptoms. Her CD4 count is normal, she has no leukocytosis and has been afebrile since admission.   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 [facility] ANP-BC Pulmonary and Critical Care Medicine [facility] Pager: [phone]  05/07/2015, 4:23 PM   STAFF NOTE: I, Rory Percy, MD FACP have personally reviewed patient's available data, including medical history, events of note, physical examination and test results as part of my evaluation. I have discussed with resident/NP and other care providers such as pharmacist, RN and RRT. In addition, I personally evaluated patient and elicited key findings of: awake, no distress, lungs initially with mild exp end wheezing, cleared with coughing, she denied any SOB, noted this am some desaturation brief to 80%, PFT prior with restriction, Dr Silverio Lay prior dx however asthmatic bronchitis, she denies any sob now and lung cough to clear, HIV noted, cd4 good, no fevers, no secretions, as she has not progressed per FPTS, would place PICC and assess spiral CT chest  For PE, also assess ehco for pa pressures with HIV status, can lower prednisone, maintain current abx course and BDers maintenance, no role bronch bal Mcarthur Rossetti. Tyson Alias, MD, FACP Pgr: [phone] [facility] 05/07/2015 9:20 PM

## 2015-05-08 ENCOUNTER — Inpatient Hospital Stay (HOSPITAL_COMMUNITY): Payer: Medicaid Other

## 2015-05-08 DIAGNOSIS — J441 Chronic obstructive pulmonary disease with (acute) exacerbation: Secondary | ICD-10-CM

## 2015-05-08 DIAGNOSIS — F200 Paranoid schizophrenia: Secondary | ICD-10-CM

## 2015-05-08 LAB — GLUCOSE, CAPILLARY
GLUCOSE-CAPILLARY: 250 mg/dL — AB (ref 65–99)
GLUCOSE-CAPILLARY: 114 mg/dL — AB (ref 65–99)
Glucose-Capillary: 90 mg/dL (ref 65–99)
Glucose-Capillary: 125 mg/dL — ABNORMAL HIGH (ref 65–99)

## 2015-05-08 MED ORDER — FUROSEMIDE 20 MG PO TABS
20.0000 mg | ORAL_TABLET | Freq: Once | ORAL | Status: AC
  Administered 2015-05-08: 20 mg via ORAL
  Filled 2015-05-08: qty 1

## 2015-05-08 MED ORDER — LORAZEPAM 2 MG/ML IJ SOLN: 1.0000 mg | INTRAMUSCULAR | Status: DC | PRN

## 2015-05-08 MED ORDER — DIVALPROEX SODIUM 125 MG PO CSDR
500.0000 mg | DELAYED_RELEASE_CAPSULE | Freq: Two times a day (BID) | ORAL | Status: DC
  Administered 2015-05-08 – 2015-05-11 (×7): 500 mg via ORAL
  Filled 2015-05-08 (×9): qty 4

## 2015-05-08 NOTE — Progress Notes (Addendum)
Pt had a one episode of vomiting this morning, IV Zofran given, Suctioning catheter is set up at bed side and teach back to patient how to use it, besides no any other complain, tolerating diet and meds fine, will continue to monitor.

## 2015-05-08 NOTE — Progress Notes (Signed)
Family Medicine Teaching Service Daily Progress Note Intern Pager: [phone]  Patient name: [name] Medical record number: [id number] Date of birth: 29-Nov-1960 Age: 55 y.o. Gender: female  Primary Care Provider: Levert Feinstein, MD Consultants: ID Code Status: Full  Pt Overview and Major Events to Date:  1/26:-admitted with Dyspnea/cough/hypoxia  Assessment and Plan: [name] is a 55 y.o. female presenting with dyspnea. PMH is significant for COPD, HIV, schizophrenia.  Dyspnea/cough/Hypoxia: Improved. Had increased sputum for one week. CTA chest in August showed ground glass opacities and granulomas. 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 Patient with HIV. Doubt opportunistic infection with CD4 count 610 in 02/2015. S/p solumedrol IV and CAT albuterol in ED for presumed COPD exacerbation. BNP 138 (63 in 12/2014). Her exam remarkable for insp and exp wheeze no crackles. Her face appears puffy. ABG showed chronic respiratory acidosis 7.38/58/75/34. CT Last fever was 101.9 at 1335 on 1/26. Trace bilateral pleural fluid noted. Underlying patchy pleural based airspace opacity noted bilaterally, likely reflecting atelectasis, though mild infection cannot be excluded. O2 has been weaned down, currently on 4 L [place].  - CCM consulted, appreciate their assistance - Appreciate ID recs:  -Continue HIV meds as below  -Continue Doxycycline 100 mg BID - Prednisone 1/27 > - duonebs q4h  - albuterol q2 prn - O2 PRN - UDS not drawn yet  DM2: A1c 6.7 in August, on metformin alone. CBG within normal range. - hold metformin while inpatient - sensitive SSI  HIV: CD4 610, RNA quant 39 in November.  - Appreicate ID recs  -Continue ARVs   - prezista, norvir, isentress and Descovy in place of Truvada which is on our formulary here  Schizophrenia: Depakote level WNL - Continue home risperdal, depakote - Psychiatry consulted, appreciate their assistance  FEN/GI:  -carb mod diet,   -SLIV Prophylaxis: lovenox  Disposition: Pending improvement in respiratory status and psych recs  Subjective:  - No acute events overnight, on 4 L O2 [place] currently - Patient laying in bed on side when walked into the room, sat up to eat breakfast when realized her tray was next to her - States "feeling good" but then admitted to some shortness of breath.  - Denies chest pain  Objective: Temp:  [97.7 F (36.5 C)-98.7 F (37.1 C)] 97.7 F (36.5 C) (01/28 0522) Pulse Rate:  [66-80] 73 (01/28 0522) Resp:  [16-18] 18 (01/28 0522) BP: (112-132)/(60-64) 132/60 mmHg (01/28 0522) SpO2:  [93 %-100 %] 94 % (01/28 0811) Weight:  [150 lb 2.1 oz (68.1 kg)] 150 lb 2.1 oz (68.1 kg) (01/28 0522) Physical Exam: Gen: Sitting up in bed eating breakfast, in NAD  Face: appears puffy CV: regular rate and rythm. S1 & S2 audible Resp: on 4L by [place], no apparent work of breathing, some end inspiratory and expiratory wheeze. GI: bowel sounds normal, no tenderness to palpation  Laboratory:  Recent Labs Lab 05/03/15 1631 05/06/15 0905 05/07/15 1536  WBC 7.3 10.0 10.1  HGB 14.4 14.8 13.5  HCT 42.3 42.8 41.6  PLT 242 267 265    Recent Labs Lab 05/03/15 1631 05/06/15 0905 05/07/15 1536  NA 126* 129* 134*  K 4.9 4.9 5.0  CL 89* 86* 95*  CO2 25 32 28  BUN <5* 10 17  CREATININE 1.01* 1.08* 1.16*  CALCIUM 8.8* 9.7 8.9  PROT  --  7.2  --   BILITOT  --  0.6  --   ALKPHOS  --  83  --   ALT  --  15  --   AST  --  22  --   GLUCOSE 108* 103* 126*    Imaging/Diagnostic Tests: Ct Chest Wo Contrast  05/08/2015  CLINICAL DATA:  Acute onset of persistent dyspnea and hypoxemia. Initial encounter. EXAM: CT CHEST WITHOUT CONTRAST TECHNIQUE: Multidetector CT imaging of the chest was performed following the standard protocol without IV contrast. COMPARISON:  Chest radiograph performed 05/06/2015 FINDINGS: Trace bilateral pleural fluid is noted. Underlying patchy pleural based airspace opacity is seen  bilaterally, likely reflecting atelectasis, though mild infection cannot be excluded. Scattered calcified granulomata are noted within the lungs bilaterally. No pneumothorax is seen. No suspicious masses are identified. Note is made of a small to moderate hiatal hernia. No definite mediastinal lymphadenopathy is seen. No pericardial effusion is identified. The great vessels are grossly unremarkable in appearance, though difficult to fully assess without contrast. The visualized portions of the thyroid gland are unremarkable. No axillary lymphadenopathy is seen. The visualized portions of the liver and spleen are unremarkable. The patient is status post cholecystectomy, with clips noted along the gallbladder fossa. The pancreas is grossly unremarkable in appearance. The adrenal glands and kidneys are grossly unremarkable. No acute osseous abnormalities identified. IMPRESSION: 1. Trace bilateral pleural fluid noted. Underlying patchy pleural based airspace opacity noted bilaterally, likely reflecting atelectasis, though mild infection cannot be excluded. 2. Small to moderate hiatal hernia noted. Electronically Signed   By: Roanna Raider M.D.   On: 05/08/2015 02:14    Beaulah Dinning, MD 05/08/2015, 9:10 AM PGY-1, [facility] FPTS Intern pager: [phone], text pages welcome

## 2015-05-08 NOTE — Progress Notes (Signed)
  Echocardiogram 2D Echocardiogram has been performed.  Delcie Roch 05/08/2015, 3:25 PM

## 2015-05-08 NOTE — Consult Note (Addendum)
[facility] Psychiatry Consult   Reason for Consult:  Schizophrenia, capacity Referring Physician:  Dr. Tamala Julian  Patient Identification: [name] MRN:  [id number] Principal Diagnosis: Schizophrenia [facility]) Diagnosis:   Patient Active Problem List   Diagnosis Date Noted  . Chronic hypercapnic respiratory failure (HCC) [J96.12]   . Dyspnea [R06.00]   . Hypoxemia [R09.02]   . COPD exacerbation ([place]) [J44.1] 05/06/2015  . Asthmatic bronchitis , chronic ([place]) [J44.9, P80.998] 05/06/2015  . Hypoxia [R09.02]   . Acute respiratory distress (HCC) [R06.00]   . Encounter for preadmission testing [Z01.818]   . Diabetes ([place]) [E11.9] 12/01/2014  . Homicidal ideation [R45.850]   . Lethargy [R53.83]   . Wheezing [R06.2]   . Delusional disorder ([place]) [F22]   . Asthma, chronic [J45.909]   . HIV disease ([place]) [B20]   . Paranoid schizophrenia ([place]) [F20.0]   . Psychosis [F29] 10/30/2014  . Psychoses [F29]   . Hyponatremia [E87.1] 10/22/2014  . Microscopic hematuria [R31.29] 01/29/2014  . Abdominal pain [R10.9] 12/12/2013  . Vulvar irritation [N90.89] 12/12/2013  . Tobacco use disorder [F17.200] 02/18/2013  . Hepatitis B infection [B16.9]   . Pre-ulcerative corn or callous [L84] 05/07/2012  . Urinary incontinence, nocturnal enuresis [N39.44] 05/07/2012  . Fecal incontinence [R15.9] 05/07/2012  . Systolic murmur [P38.2] 50/53/9767  . Routine health maintenance [Z00.00] 05/07/2012  . Asthma [J45.909] 08/18/2010  . HIV (human immunodeficiency virus infection) ([place]) [Z21] 08/18/2010  . Schizophrenia ([place]) [F20.9] 08/18/2010  . Herpes simplex infection [B00.9] 08/18/2010    Total Time spent with patient: 30 minutes  Subjective:   [name] is a 55 y.o. female patient admitted with .  HPI:  Patient is 55 year old Afro-American female who has history of paranoid schizophrenia admitted due to dyspnea.  Psychiatry consult was called as patient is refusing to cooperate and wants to leave  AMA.  Patient has significant history of multiple health issues including diabetes mellitus, HIV and COPD.  Patient refused to cooperate.  She appears very labile, easily irritable, agitated the start yelling and cursing when question asked.  She appears paranoid, disorganized and very labile.  She admitted taking psychiatric medication but did not provide much detail.  When I ask if she want to stay in the hospital for further treatment she scream at me and say yes.  However her mood remains very labile and inappropriate.  Though she denies any suicidal thoughts or homicidal thought but appears angry, irritable and paranoid.  Due to her behavior unable to get more information from her.  Most of the information was obtained from medical record.  Apparently patient has been admitted in the hospital due to paranoia and disorganized thinking.  Currently she is seeing psychiatrist at [facility].  When I ask what circumstances brought her to the hospital she scream again and replied that time sick.  Patient has no cognitive insight into her illness.  She's been very uncooperative and did not provide any cohesive reason.  Past Psychiatric History: As per chart patient has multiple psychiatric hospitalization.  She has diagnosed with schizophrenia chronic paranoid type.  Risk to Self: Is patient at risk for suicide?: No Risk to Others:   Prior Inpatient Therapy:   Prior Outpatient Therapy:    Past Medical History:  Past Medical History  Diagnosis Date  . GERD (gastroesophageal reflux disease)   . Schizophrenia ([place])   . AIDS ([place]) 12-10-2007  . Herpes simplex   . History of cholecystectomy   . Cataract   . Nonspecific  reaction to tuberculin skin test without active tuberculosis 11-2008 [facility]   . Seizures ([place])   . Hepatitis B infection     followed by ID  . Chronic asthmatic bronchitis ([place])   . Tobacco use   . Diabetes mellitus without complication ([place])     type 2 with out complication    Past  Surgical History  Procedure Laterality Date  . Cholecystectomy    . Leg surgery Right     s/p accident; "steel fell on the back of my leg"  . Tonsillectomy     Family History:  Family History  Problem Relation Age of Onset  . Diabetes Sister   . Cancer Sister     breast  . Diabetes Brother   . Hypertension Brother   . Cancer Mother     lung  . Hypertension Mother   . Heart disease Father    Family Psychiatric  History: Unknown Social History:  History  Alcohol Use No    Comment: Patient denies      History  Drug Use No    Comment: Patient denies    Social History   Social History  . Marital Status: Single    Spouse Name: N/A  . Number of Children: N/A  . Years of Education: N/A   Social History Main Topics  . Smoking status: Current Every Day Smoker -- 0.50 packs/day for 35 years    Types: Cigarettes    Start date: 04/10/1974  . Smokeless tobacco: Never Used     Comment: Previously smoked 2ppd, might start patch  . Alcohol Use: No     Comment: Patient denies   . Drug Use: No     Comment: Patient denies  . Sexual Activity: Not Currently     Comment: pt. declined condoms   Other Topics Concern  . None   Social History Narrative   The patient says she was born and raised in [place]. She says she currently lives in [place] with her brother. She says she has 1 son and is currently divorced. She was a very poor historian and unable to give any further social history.   Additional Social History:                          Allergies:  No Known Allergies  Labs:  Results for orders placed or performed during the hospital encounter of 05/06/15 (from the past 48 hour(s))  Glucose, capillary     Status: Abnormal   Collection Time: 05/06/15  4:20 PM  Result Value Ref Range   Glucose-Capillary 173 (H) 65 - 99 mg/dL   Comment 1 Notify RN    Comment 2 Document in Chart   Hemoglobin A1c     Status: Abnormal   Collection Time: 05/06/15  6:12 PM   Result Value Ref Range   Hgb A1c MFr Bld 6.5 (H) 4.8 - 5.6 %    Comment: (NOTE)         Pre-diabetes: 5.7 - 6.4         Diabetes: >6.4         Glycemic control for adults with diabetes: <7.0    Mean Plasma Glucose 140 mg/dL    Comment: (NOTE) Performed At: [facility] [place], [place] [id number] Lindon Romp MD [id number]   Glucose, capillary     Status: Abnormal   Collection Time: 05/06/15  9:51 PM  Result Value Ref Range  Glucose-Capillary 113 (H) 65 - 99 mg/dL  Glucose, capillary     Status: Abnormal   Collection Time: 05/07/15  8:01 AM  Result Value Ref Range   Glucose-Capillary 136 (H) 65 - 99 mg/dL   Comment 1 Document in Chart   Glucose, capillary     Status: Abnormal   Collection Time: 05/07/15 11:13 AM  Result Value Ref Range   Glucose-Capillary 124 (H) 65 - 99 mg/dL   Comment 1 Document in Chart   Blood gas, arterial     Status: Abnormal   Collection Time: 05/07/15 11:26 AM  Result Value Ref Range   O2 Content 5.0 L/min   Delivery systems NASAL CANNULA    pH, Arterial 7.382 7.350 - 7.450   pCO2 arterial 57.9 (HH) 35.0 - 45.0 mmHg    Comment: CRITICAL RESULT CALLED TO, READ BACK BY AND VERIFIED WITH: JASMINE ADAMS,RN AT 1140 BY SARAH BROWNING,RRT,RCP ON 05/07/2015    pO2, Arterial 74.7 (L) 80.0 - 100.0 mmHg   Bicarbonate 33.7 (H) 20.0 - 24.0 mEq/L   TCO2 35.4 0 - 100 mmol/L   Acid-Base Excess 8.5 (H) 0.0 - 2.0 mmol/L   O2 Saturation 94.3 %   Patient temperature 98.6    Collection site RIGHT RADIAL    Drawn by [phone]    Sample type ARTERIAL DRAW    Allens test (pass/fail) PASS PASS  Basic metabolic panel     Status: Abnormal   Collection Time: 05/07/15  3:36 PM  Result Value Ref Range   Sodium 134 (L) 135 - 145 mmol/L   Potassium 5.0 3.5 - 5.1 mmol/L   Chloride 95 (L) 101 - 111 mmol/L   CO2 28 22 - 32 mmol/L   Glucose, Bld 126 (H) 65 - 99 mg/dL   BUN 17 6 - 20 mg/dL   Creatinine, Ser 1.16 (H) 0.44 - 1.00 mg/dL   Calcium  8.9 8.9 - 10.3 mg/dL   GFR calc non Af Amer 52 (L) >60 mL/min   GFR calc Af Amer >60 >60 mL/min    Comment: (NOTE) The eGFR has been calculated using the CKD EPI equation. This calculation has not been validated in all clinical situations. eGFR's persistently <60 mL/min signify possible Chronic Kidney Disease.    Anion gap 11 5 - 15  CBC     Status: Abnormal   Collection Time: 05/07/15  3:36 PM  Result Value Ref Range   WBC 10.1 4.0 - 10.5 K/uL   RBC 4.78 3.87 - 5.11 MIL/uL   Hemoglobin 13.5 12.0 - 15.0 g/dL   HCT 41.6 36.0 - 46.0 %   MCV 87.0 78.0 - 100.0 fL   MCH 28.2 26.0 - 34.0 pg   MCHC 32.5 30.0 - 36.0 g/dL   RDW 16.5 (H) 11.5 - 15.5 %   Platelets 265 150 - 400 K/uL  Valproic acid level     Status: None   Collection Time: 05/07/15  3:36 PM  Result Value Ref Range   Valproic Acid Lvl 52 50.0 - 100.0 ug/mL  Glucose, capillary     Status: Abnormal   Collection Time: 05/07/15  4:11 PM  Result Value Ref Range   Glucose-Capillary 127 (H) 65 - 99 mg/dL  Glucose, capillary     Status: Abnormal   Collection Time: 05/07/15 10:39 PM  Result Value Ref Range   Glucose-Capillary 127 (H) 65 - 99 mg/dL  Glucose, capillary     Status: None   Collection Time: 05/08/15  8:02 AM  Result Value Ref Range   Glucose-Capillary 90 65 - 99 mg/dL  Glucose, capillary     Status: Abnormal   Collection Time: 05/08/15 11:54 AM  Result Value Ref Range   Glucose-Capillary 125 (H) 65 - 99 mg/dL    Current Facility-Administered Medications  Medication Dose Route Frequency Provider Last Rate Last Dose  . acetaminophen (TYLENOL) tablet 650 mg  650 mg Oral Q6H PRN Frazier Richards, MD       Or  . acetaminophen (TYLENOL) suppository 650 mg  650 mg Rectal Q6H PRN Frazier Richards, MD   650 mg at 05/06/15 1340  . albuterol (PROVENTIL) (2.5 MG/3ML) 0.083% nebulizer solution 2.5 mg  2.5 mg Nebulization Q2H PRN Frazier Richards, MD      . Darunavir Ethanolate (PREZISTA) tablet 800 mg  800 mg Oral Q breakfast  Thayer Headings, MD   800 mg at 05/08/15 6546  . divalproex (DEPAKOTE SPRINKLE) capsule 500 mg  500 mg Oral QHS Frazier Richards, MD   500 mg at 05/07/15 2252  . doxycycline (VIBRA-TABS) tablet 100 mg  100 mg Oral Q12H Thayer Headings, MD   100 mg at 05/08/15 5035  . emtricitabine-tenofovir AF (DESCOVY) 200-25 MG per tablet 1 tablet  1 tablet Oral Daily Thayer Headings, MD   1 tablet at 05/08/15 0955  . enoxaparin (LOVENOX) injection 40 mg  40 mg Subcutaneous Q24H Frazier Richards, MD   40 mg at 05/07/15 1752  . famotidine (PEPCID) IVPB 20 mg premix  20 mg Intravenous Q12H Frazier Richards, MD   20 mg at 05/08/15 0955  . furosemide (LASIX) tablet 20 mg  20 mg Oral Once Carlyle Dolly, MD      . insulin aspart (novoLOG) injection 0-5 Units  0-5 Units Subcutaneous QHS Frazier Richards, MD   0 Units at 05/06/15 2152  . insulin aspart (novoLOG) injection 0-9 Units  0-9 Units Subcutaneous TID WC Frazier Richards, MD   1 Units at 05/07/15 1750  . ipratropium-albuterol (DUONEB) 0.5-2.5 (3) MG/3ML nebulizer solution 3 mL  3 mL Nebulization Q4H Mercy Riding, MD   3 mL at 05/08/15 1549  . mometasone-formoterol (DULERA) 200-5 MCG/ACT inhaler 2 puff  2 puff Inhalation BID Blane Ohara McDiarmid, MD   2 puff at 05/07/15 2042  . ondansetron (ZOFRAN) injection 4 mg  4 mg Intravenous Q6H PRN Sela Hua, MD   4 mg at 05/08/15 1052  . predniSONE (DELTASONE) tablet 50 mg  50 mg Oral Daily Frazier Richards, MD   50 mg at 05/08/15 4656  . raltegravir (ISENTRESS) tablet 400 mg  400 mg Oral BID Thayer Headings, MD   400 mg at 05/08/15 0955  . risperiDONE (RISPERDAL M-TABS) disintegrating tablet 3 mg  3 mg Oral BID Frazier Richards, MD   3 mg at 05/08/15 0951  . ritonavir (NORVIR) tablet 100 mg  100 mg Oral Q breakfast Thayer Headings, MD   100 mg at 05/08/15 8127  . sodium chloride flush (NS) 0.9 % injection 3 mL  3 mL Intravenous Q12H Frazier Richards, MD   3 mL at 05/08/15 1002    Musculoskeletal: Strength & Muscle Tone: within normal  limits Gait & Station: unable to stand Patient leans: N/A  Psychiatric Specialty Exam: Review of Systems  Psychiatric/Behavioral: The patient is nervous/anxious and has insomnia.        Angry, labile, paranoia    Blood pressure 90/50, pulse 78,  temperature 98.5 F (36.9 C), temperature source Oral, resp. rate 18, height 4' 11"  (1.499 m), weight 68.1 kg (150 lb 2.1 oz), SpO2 100 %.Body mass index is 30.31 kg/(m^2).  General Appearance: Disheveled, Fairly Groomed and Guarded  Engineer, water::  Minimal  Speech:  Pressured  Volume:  Increased  Mood:  Angry and Irritable  Affect:  Inappropriate, Labile and Full Range  Thought Process:  Circumstantial, Disorganized, Irrelevant, Loose and Tangential  Orientation:  Full (Time, Place, and Person)  Thought Content:  Paranoid Ideation and Rumination  Suicidal Thoughts:  No  Homicidal Thoughts:  No  Memory:  Immediate;   Poor Recent;   Poor Remote;   Fair  Judgement:  Impaired  Insight:  Lacking  Psychomotor Activity:  Increased  Concentration:  Fair  Recall:  Poor  Fund of Knowledge:Poor  Language: Fair  Akathisia:  No  Handed:  Right  AIMS (if indicated):     Assets:  Housing  ADL's:  Impaired  Cognition: Impaired,  Mild  Sleep:      Treatment Plan Summary: Plan Patient does not have capacity to participate in her treatment plan.  Currently taking Risperdal 3 mg once a day and Depakote 500 mg twice a day.  Consider increasing Depakote 1500 mg a day, repeat the level.  Consider giving when necessary Ativan for severe agitation if not medically contraindicated.  Please call consultation liaison services for follow-up if needed  Disposition: Supportive therapy provided about ongoing stressors. Discussed crisis plan, support from social network, calling 911, coming to the Emergency Department, and calling Suicide Hotline.  Maciel Kegg T. 05/08/2015 3:59 PM

## 2015-05-08 NOTE — Progress Notes (Signed)
   Name: [name] MRN: [id number] DOB: 02-12-1961    ADMISSION DATE:  05/06/2015   CONSULTATION DATE:  05/07/2015  REFERRING MD : Family medicine  Reason for consult:  Persistent dyspnea despite treatment  BRIEF PATIENT DESCRIPTION: 55 yo AA female with a h/o   asthmatic bronchitis, schizophrenia, diabetes and HIV infection admitted with worsening dyspnea and  productive cough x 1 week.   SIGNIFICANT EVENTS  01/23: ED with SOB; treated with nebulizer;  refused admission and IV fluids; left AMA 01/26: ED with SOB, cough and hypoxia  STUDIES:  2 D echo 01/27> CTA chest 01/28>1. Trace bilateral pleural fluid noted. Underlying patchy pleural based airspace opacity noted bilaterally, likely reflecting atelectasis, though mild infection cannot be excluded. 2. Small to moderate hiatal hernia noted.      SUBJECTIVE:  Some better/ no increased wob on 4lpm np at 30 degree hob  VITAL SIGNS: Temp:  [98.2 F (36.8 C)-98.5 F (36.9 C)] 98.5 F (36.9 C) (01/27 0453) Pulse Rate:  [74-88] 74 (01/27 0453) Resp:  [16-20] 16 (01/27 0453) BP: (109-122)/(63-75) 109/63 mmHg (01/27 0453) SpO2:  [93 %-100 %] 100 % (01/27 1510)  PHYSICAL EXAMINATION: General: Chronically ill-looking Neuro:  AAOX 2; speech is normal, moves all extremities HEENT: PERRLA Cardiovascular: RRR, S1/S2; no edema Lungs:   mid/late bilateral expiratory wheezes, breath sounds diminished in the bases Abdomen: soft, NT/ND, normal BS Musculoskeletal:  No obvious deformities Skin:  Warm; no rash   Recent Labs Lab 05/03/15 1631 05/06/15 0905  NA 126* 129*  K 4.9 4.9  CL 89* 86*  CO2 25 32  BUN <5* 10  CREATININE 1.01* 1.08*  GLUCOSE 108* 103*    Recent Labs Lab 05/03/15 1631 05/06/15 0905 05/07/15 1536  HGB 14.4 14.8 13.5  HCT 42.3 42.8 41.6  WBC 7.3 10.0 10.1  PLT 242 267 265   Dg Chest Port 1 View  05/06/2015  CLINICAL DATA:  Cough for 1 week EXAM: PORTABLE CHEST 1 VIEW COMPARISON:  05/03/2015  FINDINGS: The heart size and mediastinal contours are within normal limits. Both lungs are clear. The visualized skeletal structures are unremarkable. IMPRESSION: No active disease. Electronically Signed   By: Elige Ko   On: 05/06/2015 09:01   Discussion 70 yobf active smoker  presenting with acute respiratory distress, hypoxemia and  productive cough. It is unclear why she desaturates with activity but this seems to be more related to an acute exacerbation of her chronic asthmatic bronchitis due to medication non-adherence. She doesn't seem to have an acute infective process that would be contributing to her symptoms. Her CD4 count is normal, she has no leukocytosis and has been afebrile since admission.     IMP: 1)   AB/ active smoker > not change rx  2) 02 dep acute resp failure > well compensated on 4llpm   3) Active cig Smoker > little to offer to help with chronic/ recurrent airway symptoms if continues to smoke against medical advice    Sandrea Hughs, MD Pulmonary and Critical Care Medicine [facility] Cell [phone] After 5:30 PM or weekends, call [phone]

## 2015-05-09 LAB — GLUCOSE, CAPILLARY
GLUCOSE-CAPILLARY: 117 mg/dL — AB (ref 65–99)
GLUCOSE-CAPILLARY: 231 mg/dL — AB (ref 65–99)
Glucose-Capillary: 133 mg/dL — ABNORMAL HIGH (ref 65–99)
Glucose-Capillary: 93 mg/dL (ref 65–99)

## 2015-05-09 MED ORDER — FAMOTIDINE 20 MG PO TABS
20.0000 mg | ORAL_TABLET | Freq: Two times a day (BID) | ORAL | Status: DC
  Administered 2015-05-09 – 2015-05-11 (×5): 20 mg via ORAL
  Filled 2015-05-09 (×5): qty 1

## 2015-05-09 MED ORDER — IPRATROPIUM-ALBUTEROL 0.5-2.5 (3) MG/3ML IN SOLN
3.0000 mL | RESPIRATORY_TRACT | Status: DC
  Administered 2015-05-09: 3 mL via RESPIRATORY_TRACT
  Filled 2015-05-09: qty 3

## 2015-05-09 MED ORDER — IPRATROPIUM-ALBUTEROL 0.5-2.5 (3) MG/3ML IN SOLN
3.0000 mL | Freq: Three times a day (TID) | RESPIRATORY_TRACT | Status: DC
  Administered 2015-05-09 – 2015-05-10 (×5): 3 mL via RESPIRATORY_TRACT
  Filled 2015-05-09 (×5): qty 3

## 2015-05-09 NOTE — Consult Note (Signed)
[facility] Face-to-Face Follow Up Note    Patient Identification: [name] MRN:  [id number] Principal Diagnosis: Schizophrenia [facility]) Diagnosis:   Patient Active Problem List   Diagnosis Date Noted  . Chronic hypercapnic respiratory failure (HCC) [J96.12]   . Dyspnea [R06.00]   . Hypoxemia [R09.02]   . COPD exacerbation ([place]) [J44.1] 05/06/2015  . Asthmatic bronchitis , chronic ([place]) [J44.9, T70.177] 05/06/2015  . Hypoxia [R09.02]   . Acute respiratory distress (HCC) [R06.00]   . Encounter for preadmission testing [Z01.818]   . Diabetes ([place]) [E11.9] 12/01/2014  . Homicidal ideation [R45.850]   . Lethargy [R53.83]   . Wheezing [R06.2]   . Delusional disorder ([place]) [F22]   . Asthma, chronic [J45.909]   . HIV disease ([place]) [B20]   . Paranoid schizophrenia ([place]) [F20.0]   . Psychosis [F29] 10/30/2014  . Psychoses [F29]   . Hyponatremia [E87.1] 10/22/2014  . Microscopic hematuria [R31.29] 01/29/2014  . Abdominal pain [R10.9] 12/12/2013  . Vulvar irritation [N90.89] 12/12/2013  . Tobacco use disorder [F17.200] 02/18/2013  . Hepatitis B infection [B16.9]   . Pre-ulcerative corn or callous [L84] 05/07/2012  . Urinary incontinence, nocturnal enuresis [N39.44] 05/07/2012  . Fecal incontinence [R15.9] 05/07/2012  . Systolic murmur [L39.0] 30/12/2328  . Routine health maintenance [Z00.00] 05/07/2012  . Asthma [J45.909] 08/18/2010  . HIV (human immunodeficiency virus infection) ([place]) [Z21] 08/18/2010  . Schizophrenia ([place]) [F20.9] 08/18/2010  . Herpes simplex infection [B00.9] 08/18/2010    Total Time spent with patient: 20 minutes  Subjective:   [name] is a 55 y.o. female patient admitted with dyspnea.  HPI:  Patient is 55 year old Afro-American female who has history of paranoid schizophrenia admitted due to dyspnea.  Psychiatry consult was called as patient is refusing to cooperate and wants to leave AMA.  Patient has significant history of multiple health issues including  diabetes mellitus, HIV and COPD.  Patient refused to cooperate.  She appears very labile, easily irritable, agitated the start yelling and cursing when question asked.  She appears paranoid, disorganized and very labile.  She admitted taking psychiatric medication but did not provide much detail.  When I ask if she want to stay in the hospital for further treatment she scream at me and say yes.  However her mood remains very labile and inappropriate.  Though she denies any suicidal thoughts or homicidal thought but appears angry, irritable and paranoid.  Due to her behavior unable to get more information from her.  Most of the information was obtained from medical record.  Apparently patient has been admitted in the hospital due to paranoia and disorganized thinking.  Currently she is seeing psychiatrist at [facility].  When I ask what circumstances brought her to the hospital she scream again and replied that time sick.  Patient has no cognitive insight into her illness.  She's been very uncooperative and did not provide any cohesive reason.  Interval history: Patient seen chart reviewed.  Depakote level pending.  She is more calm cooperative and coherent.  Her mental status is improved from the past.  She admitted history of schizophrenia and has been taking medication from [facility]  Do not remember when was she received Risperdal [place].   she is taking her medication in the unit.  Overall her behavior and agitation is also improved from the past.  She denies any suicidal thoughts or homicidal thoughts.  She denies any hallucination or any paranoia.  She is in agreement to continue medication and treatment plan formed by  her physician.  Past Medical History:  Past Medical History  Diagnosis Date  . GERD (gastroesophageal reflux disease)   . Schizophrenia ([place])   . AIDS ([place]) 12-10-2007  . Herpes simplex   . History of cholecystectomy   . Cataract   . Nonspecific reaction to tuberculin skin test without  active tuberculosis 11-2008 [facility]   . Seizures ([place])   . Hepatitis B infection     followed by ID  . Chronic asthmatic bronchitis ([place])   . Tobacco use   . Diabetes mellitus without complication ([place])     type 2 with out complication    Past Surgical History  Procedure Laterality Date  . Cholecystectomy    . Leg surgery Right     s/p accident; "steel fell on the back of my leg"  . Tonsillectomy     Family History:  Family History  Problem Relation Age of Onset  . Diabetes Sister   . Cancer Sister     breast  . Diabetes Brother   . Hypertension Brother   . Cancer Mother     lung  . Hypertension Mother   . Heart disease Father    Family Psychiatric  History: Unknown Social History:  History  Alcohol Use No    Comment: Patient denies      History  Drug Use No    Comment: Patient denies    Social History   Social History  . Marital Status: Single    Spouse Name: N/A  . Number of Children: N/A  . Years of Education: N/A   Social History Main Topics  . Smoking status: Current Every Day Smoker -- 0.50 packs/day for 35 years    Types: Cigarettes    Start date: 04/10/1974  . Smokeless tobacco: Never Used     Comment: Previously smoked 2ppd, might start patch  . Alcohol Use: No     Comment: Patient denies   . Drug Use: No     Comment: Patient denies  . Sexual Activity: Not Currently     Comment: pt. declined condoms   Other Topics Concern  . None   Social History Narrative   The patient says she was born and raised in [place]. She says she currently lives in [place] with her brother. She says she has 1 son and is currently divorced. She was a very poor historian and unable to give any further social history.   Allergies:  No Known Allergies  Labs:  Results for orders placed or performed during the hospital encounter of 05/06/15 (from the past 48 hour(s))  Basic metabolic panel     Status: Abnormal   Collection Time: 05/07/15  3:36 PM  Result Value  Ref Range   Sodium 134 (L) 135 - 145 mmol/L   Potassium 5.0 3.5 - 5.1 mmol/L   Chloride 95 (L) 101 - 111 mmol/L   CO2 28 22 - 32 mmol/L   Glucose, Bld 126 (H) 65 - 99 mg/dL   BUN 17 6 - 20 mg/dL   Creatinine, Ser 1.16 (H) 0.44 - 1.00 mg/dL   Calcium 8.9 8.9 - 10.3 mg/dL   GFR calc non Af Amer 52 (L) >60 mL/min   GFR calc Af Amer >60 >60 mL/min    Comment: (NOTE) The eGFR has been calculated using the CKD EPI equation. This calculation has not been validated in all clinical situations. eGFR's persistently <60 mL/min signify possible Chronic Kidney Disease.    Anion gap 11 5 -  15  CBC     Status: Abnormal   Collection Time: 05/07/15  3:36 PM  Result Value Ref Range   WBC 10.1 4.0 - 10.5 K/uL   RBC 4.78 3.87 - 5.11 MIL/uL   Hemoglobin 13.5 12.0 - 15.0 g/dL   HCT 41.6 36.0 - 46.0 %   MCV 87.0 78.0 - 100.0 fL   MCH 28.2 26.0 - 34.0 pg   MCHC 32.5 30.0 - 36.0 g/dL   RDW 16.5 (H) 11.5 - 15.5 %   Platelets 265 150 - 400 K/uL  Valproic acid level     Status: None   Collection Time: 05/07/15  3:36 PM  Result Value Ref Range   Valproic Acid Lvl 52 50.0 - 100.0 ug/mL  Glucose, capillary     Status: Abnormal   Collection Time: 05/07/15  4:11 PM  Result Value Ref Range   Glucose-Capillary 127 (H) 65 - 99 mg/dL  Glucose, capillary     Status: Abnormal   Collection Time: 05/07/15 10:39 PM  Result Value Ref Range   Glucose-Capillary 127 (H) 65 - 99 mg/dL  Glucose, capillary     Status: None   Collection Time: 05/08/15  8:02 AM  Result Value Ref Range   Glucose-Capillary 90 65 - 99 mg/dL  Glucose, capillary     Status: Abnormal   Collection Time: 05/08/15 11:54 AM  Result Value Ref Range   Glucose-Capillary 125 (H) 65 - 99 mg/dL  Glucose, capillary     Status: Abnormal   Collection Time: 05/08/15  5:12 PM  Result Value Ref Range   Glucose-Capillary 250 (H) 65 - 99 mg/dL  Glucose, capillary     Status: Abnormal   Collection Time: 05/08/15 10:03 PM  Result Value Ref Range    Glucose-Capillary 114 (H) 65 - 99 mg/dL  Glucose, capillary     Status: Abnormal   Collection Time: 05/09/15  7:47 AM  Result Value Ref Range   Glucose-Capillary 133 (H) 65 - 99 mg/dL  Glucose, capillary     Status: Abnormal   Collection Time: 05/09/15 11:43 AM  Result Value Ref Range   Glucose-Capillary 117 (H) 65 - 99 mg/dL    Current Facility-Administered Medications  Medication Dose Route Frequency Provider Last Rate Last Dose  . acetaminophen (TYLENOL) tablet 650 mg  650 mg Oral Q6H PRN Frazier Richards, MD       Or  . acetaminophen (TYLENOL) suppository 650 mg  650 mg Rectal Q6H PRN Frazier Richards, MD   650 mg at 05/06/15 1340  . albuterol (PROVENTIL) (2.5 MG/3ML) 0.083% nebulizer solution 2.5 mg  2.5 mg Nebulization Q2H PRN Frazier Richards, MD      . Darunavir Ethanolate (PREZISTA) tablet 800 mg  800 mg Oral Q breakfast Thayer Headings, MD   800 mg at 05/09/15 9983  . divalproex (DEPAKOTE SPRINKLE) capsule 500 mg  500 mg Oral Q12H Blane Ohara McDiarmid, MD   500 mg at 05/09/15 0820  . emtricitabine-tenofovir AF (DESCOVY) 200-25 MG per tablet 1 tablet  1 tablet Oral Daily Thayer Headings, MD   1 tablet at 05/09/15 [phone]  . enoxaparin (LOVENOX) injection 40 mg  40 mg Subcutaneous Q24H Frazier Richards, MD   40 mg at 05/08/15 1744  . famotidine (PEPCID) tablet 20 mg  20 mg Oral BID Roma Schanz, [place]      . insulin aspart (novoLOG) injection 0-5 Units  0-5 Units Subcutaneous QHS Frazier Richards, MD   0  Units at 05/06/15 2152  . insulin aspart (novoLOG) injection 0-9 Units  0-9 Units Subcutaneous TID WC Frazier Richards, MD   1 Units at 05/09/15 [phone]  . ipratropium-albuterol (DUONEB) 0.5-2.5 (3) MG/3ML nebulizer solution 3 mL  3 mL Nebulization TID Blane Ohara McDiarmid, MD   3 mL at 05/09/15 1234  . LORazepam (ATIVAN) injection 1 mg  1 mg Intravenous Q4H PRN Blane Ohara McDiarmid, MD      . mometasone-formoterol (DULERA) 200-5 MCG/ACT inhaler 2 puff  2 puff Inhalation BID Blane Ohara McDiarmid, MD   2 puff at 05/09/15  0941  . ondansetron (ZOFRAN) injection 4 mg  4 mg Intravenous Q6H PRN Sela Hua, MD   4 mg at 05/08/15 1052  . predniSONE (DELTASONE) tablet 50 mg  50 mg Oral Daily Frazier Richards, MD   50 mg at 05/09/15 0820  . raltegravir (ISENTRESS) tablet 400 mg  400 mg Oral BID Thayer Headings, MD   400 mg at 05/09/15 5537  . risperiDONE (RISPERDAL M-TABS) disintegrating tablet 3 mg  3 mg Oral BID Frazier Richards, MD   3 mg at 05/09/15 4827  . ritonavir (NORVIR) tablet 100 mg  100 mg Oral Q breakfast Thayer Headings, MD   100 mg at 05/09/15 0820  . sodium chloride flush (NS) 0.9 % injection 3 mL  3 mL Intravenous Q12H Frazier Richards, MD   3 mL at 05/09/15 0825    Musculoskeletal: Strength & Muscle Tone: within normal limits Gait & Station: Lying on the bed Patient leans: N/A  Psychiatric Specialty Exam: Review of Systems  Psychiatric/Behavioral: The patient is nervous/anxious and has insomnia.     Blood pressure 126/59, pulse 77, temperature 98.2 F (36.8 C), temperature source Oral, resp. rate 16, height 4' 11"  (1.499 m), weight 68.4 kg (150 lb 12.7 oz), SpO2 97 %.Body mass index is 30.44 kg/(m^2).  General Appearance: Fairly Groomed  Engineer, water::  Fair  Speech:  Slow  Volume:  Normal  Mood:  Euthymic  Affect:  Labile  Thought Process:  Circumstantial and Loose  Orientation:  Full (Time, Place, and Person)  Thought Content:  Rumination  Suicidal Thoughts:  No  Homicidal Thoughts:  No  Memory:  Immediate;   Poor Recent;   Poor Remote;   Fair  Judgement:  Fair  Insight:  Fair and Lacking  Psychomotor Activity:  Increased  Concentration:  Fair  Recall:  Poor  Fund of Knowledge:Poor  Language: Fair  Akathisia:  No  Handed:  Right  AIMS (if indicated):     Assets:  Housing  ADL's:  Impaired  Cognition: Impaired,  Mild  Sleep:      Treatment Plan Summary: Patient condition is improved from the past.  Delirious symptoms slowly resolving.  Continue Risperdal 3 mg twice a day.  Waiting  Depakote level.  If low consider increasing Depakote 1500 mg a day.  Consider giving when necessary Ativan for severe agitation if not medically contraindicated.  Please call consultation liaison services for follow-up if needed  Disposition: Supportive therapy provided about ongoing stressors. Discussed crisis plan, support from social network, calling 911, coming to the Emergency Department, and calling Suicide Hotline.  Haskell Rihn T. 05/09/2015 2:15 PM

## 2015-05-09 NOTE — Progress Notes (Signed)
Family Medicine Teaching Service Daily Progress Note Intern Pager: [phone]  Patient name: LENOR PROVENCHER Medical record number: [id number] Date of birth: 10-13-1960 Age: 55 y.o. Gender: female  Primary Care Provider: Levert Feinstein, MD Consultants: ID Code Status: Full  Pt Overview and Major Events to Date:  1/26:-admitted with Dyspnea/cough/hypoxia  Assessment and Plan: JENNIGER FIGIEL is a 55 y.o. female presenting with dyspnea. PMH is significant for COPD, HIV, schizophrenia.  Acute respiratory failure with dyspnea/cough/hypoxia likely from asthma exacerbation: Improved. ABG with chronic resp acidosis 7.38/58/75/34.  CTA chest in August showed ground glass opacities and granulomas. 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 Patient with HIV. Doubt opportunistic infection with CD4 count 610 in 02/2015.  BNP 138 (63 in 12/2014) but doesn't look fluid up. CXR normal as well.  Not able to get good IV access for CTA. However, CT w/o contrast suggestive for atelectasis vs infection. Last fever on 1/26 to 102. Echo incomplete but EF of 60-65%. On 4 L by [place] this am. Her exam with mild end-expiratory wheeze over right upper lobe anteriorly. No work of breathing.  -S/p solumedrol IV and CAT albuterol in ED. - Appreciate CCM recs - Appreciate ID recs:  -Continue HIV meds as below  -Continue Doxycycline 100 mg TWICE A DAY (1/27> - Prednisone 1/27 > - Dulera 200/5 twice a day 1/27> - duonebs q4h  - albuterol q2 prn - Wean oxygen as tolerated - UDS not drawn yet  DM2: A1c 6.7 in August, on metformin alone. CBG within normal range. - hold metformin while inpatient - sensitive SSI  HIV: CD4 610, RNA quant 39 in November.  - Appreicate ID recs  -Continue ARVs   - prezista, norvir, isentress and Descovy in place of Truvada which is on our formulary here  Schizophrenia: on depakote 500 mg once daily, Risperidone 3 mg daily and 50 mg IM once a month. Not sure if she has gotten the later recently.Depakote  level WNL on admission. -Appreciated psyc recs:  -patient don't have capacity  -Increase Depakote to 1500 mg daily. Psych thinks she is taking 500 mg twice a day.  Increased to 500 mg twice a day on 01/29. Will check trough on 01/31 in the morning before her morning dose.  -Ativan as needed for severe agitation - Continue home risperdal  FEN/GI:  -carb mod diet,  -SLIV Prophylaxis: lovenox  Disposition: Pending improvement in respiratory status. We are weaning her oxygen today.  Subjective:  No complaints this morning. She has some chest pain that has resolved. Denies trouble breathing.  Objective: Temp:  [98.1 F (36.7 C)-99.3 F (37.4 C)] 98.2 F (36.8 C) (01/29 0842) Pulse Rate:  [66-86] 77 (01/29 0543) Resp:  [16-18] 16 (01/29 0842) BP: (90-126)/(50-85) 126/59 mmHg (01/29 0842) SpO2:  [93 %-100 %] 100 % (01/29 0842) Weight:  [150 lb 12.7 oz (68.4 kg)] 150 lb 12.7 oz (68.4 kg) (01/28 2036) Physical Exam: Gen: lying in bed, able to sit up for exam easily, cooperative and appropriate CV: regular rate and rythm. S1 & S2 audible Resp: on 4L by [place], no apparent work of breathing, some end expiratory wheeze over right upper lobe anteriorly. GI: bowel sounds normal, no tenderness to palpation Ext: significant scar from CVA in right calf, no edema, cold to touch, 1+ DP pulse bilaterally Neuro: AAOx4 except date and month.  Laboratory:  Recent Labs Lab 05/03/15 1631 05/06/15 0905 05/07/15 1536  WBC 7.3 10.0 10.1  HGB 14.4 14.8 13.5  HCT 42.3 42.8  41.6  PLT 242 267 265    Recent Labs Lab 05/03/15 1631 05/06/15 0905 05/07/15 1536  NA 126* 129* 134*  K 4.9 4.9 5.0  CL 89* 86* 95*  CO2 25 32 28  BUN <5* 10 17  CREATININE 1.01* 1.08* 1.16*  CALCIUM 8.8* 9.7 8.9  PROT  --  7.2  --   BILITOT  --  0.6  --   ALKPHOS  --  83  --   ALT  --  15  --   AST  --  22  --   GLUCOSE 108* 103* 126*    Imaging/Diagnostic Tests: No results found.  Almon Hercules,  MD 05/09/2015, 9:13 AM PGY-1, [facility] FPTS Intern pager: [phone], text pages welcome

## 2015-05-10 ENCOUNTER — Ambulatory Visit: Payer: Self-pay | Admitting: Family Medicine

## 2015-05-10 DIAGNOSIS — F209 Schizophrenia, unspecified: Secondary | ICD-10-CM

## 2015-05-10 LAB — BASIC METABOLIC PANEL
ANION GAP: 10 (ref 5–15)
BUN: 24 mg/dL — ABNORMAL HIGH (ref 6–20)
CALCIUM: 9.2 mg/dL (ref 8.9–10.3)
CO2: 29 mmol/L (ref 22–32)
CREATININE: 1.07 mg/dL — AB (ref 0.44–1.00)
Chloride: 96 mmol/L — ABNORMAL LOW (ref 101–111)
GFR calc non Af Amer: 57 mL/min — ABNORMAL LOW (ref >60)
Glucose, Bld: 131 mg/dL — ABNORMAL HIGH (ref 65–99)
Potassium: 5 mmol/L (ref 3.5–5.1)
Sodium: 135 mmol/L (ref 135–145)

## 2015-05-10 LAB — GLUCOSE, CAPILLARY
GLUCOSE-CAPILLARY: 156 mg/dL — AB (ref 65–99)
GLUCOSE-CAPILLARY: 246 mg/dL — AB (ref 65–99)
GLUCOSE-CAPILLARY: 118 mg/dL — AB (ref 65–99)
Glucose-Capillary: 83 mg/dL (ref 65–99)

## 2015-05-10 MED ORDER — DIVALPROEX SODIUM 125 MG PO CSDR: 500.0000 mg | DELAYED_RELEASE_CAPSULE | Freq: Two times a day (BID) | ORAL | Status: DC

## 2015-05-10 MED ORDER — MOMETASONE FURO-FORMOTEROL FUM 200-5 MCG/ACT IN AERO: 2.0000 | INHALATION_SPRAY | Freq: Two times a day (BID) | RESPIRATORY_TRACT | Status: DC

## 2015-05-10 MED ORDER — IPRATROPIUM-ALBUTEROL 0.5-2.5 (3) MG/3ML IN SOLN: 3.0000 mL | Freq: Four times a day (QID) | RESPIRATORY_TRACT | Status: DC | PRN

## 2015-05-10 NOTE — Progress Notes (Signed)
Family Medicine Teaching Service Daily Progress Note Intern Pager: [phone]  Patient name: [name] Medical record number: [id number] Date of birth: Jan 12, 1961 Age: 55 y.o. Gender: female  Primary Care Provider: Levert Feinstein, MD Consultants: ID Code Status: Full  Pt Overview and Major Events to Date:  1/26:-admitted with Dyspnea/cough/hypoxia  Assessment and Plan: [name] is a 55 y.o. female presenting with dyspnea. PMH is significant for COPD, HIV, schizophrenia.  Acute respiratory failure with dyspnea/cough/hypoxia likely from asthma exacerbation: Improved. ABG with chronic resp acidosis 7.38/58/75/34.  CTA chest in 11/2014 with ground glass opacities and granulomas. 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 Patient with HIV. Doubt opportunistic infection with CD4 count 610 in 02/2015.  BNP 138 (63 in 12/2014) but doesn't look fluid up. CXR normal as well.  Not able to get good IV access for CTA. However, CT w/o contrast suggestive for atelectasis vs infection. Last fever on 1/26 to 102. Echo incomplete but EF of 60-65%. On 1 L by [place] overnight. No work of breathing. Lungs clear to auscultation bilaterally.  -S/p solumedrol IV, CAT albuterol and Levaquin in ED on 1/26. - Appreciate CCM recs  -Outpatient follow up on 02/20. Already set up - Appreciate ID recs:  -Continue HIV meds as below  -Continue Doxycycline 100 mg twice a day (1/27>1/30) - Prednisone 1/27 >1/30) - Dulera 200/5 twice a day 1/27> - duonebs q4h  - albuterol q2 prn - Will ambulate without oxygen today   DM2: A1c 6.7 in August, on metformin alone. CBG within normal range. - hold metformin while inpatient - sensitive SSI  HIV: CD4 610, RNA quant 39 in November.  - Appreicate ID recs  -Continue ARVs   - prezista, norvir, isentress and Descovy in place of Truvada which is on our formulary here  Schizophrenia: on depakote 500 mg once daily, Risperidone 3 mg daily and 50 mg IM once a month. Not sure if she has  gotten the later recently.Depakote level WNL on admission. -Appreciated psyc recs:  -patient don't have capacity  -Increase Depakote to 1500 mg daily. Increased to 500 mg twice a day on 01/29. Will check trough on 01/31 in the morning before her morning dose.  -Ativan as needed for severe agitation - Psych to reevaluate for capacity again. - Continue home risperdal  FEN/GI:  -carb mod diet,  -SLIV Prophylaxis: lovenox  Disposition: discharge pending oxygen requirement and psyc eval for capacity.  Subjective:  No complaints this morning. Denies shortness of breath or shortness of breath.  Objective: Temp:  [98.4 F (36.9 C)-99.6 F (37.6 C)] 98.4 F (36.9 C) (01/30 0921) Pulse Rate:  [68-80] 72 (01/30 0921) Resp:  [16-18] 18 (01/30 0921) BP: (111-146)/(58-70) 124/58 mmHg (01/30 0921) SpO2:  [88 %-99 %] 98 % (01/30 0921) FiO2 (%):  [32 %] 32 % (01/29 1235) Physical Exam: Gen: lying in bed, able to sit up for exam easily, cooperative and appropriate CV: regular rate and rythm. S1 & S2 audible Resp: on 1L by [place], no apparent work of breathing, some end expiratory wheeze over right upper lobe anteriorly. GI: bowel sounds normal, no tenderness to palpation Ext: significant scar from CVA in right calf, no edema, cold to touch, 1+ DP pulse bilaterally Neuro: AAOx4 except date and month.  Laboratory:  Recent Labs Lab 05/03/15 1631 05/06/15 0905 05/07/15 1536  WBC 7.3 10.0 10.1  HGB 14.4 14.8 13.5  HCT 42.3 42.8 41.6  PLT 242 267 265    Recent Labs Lab 05/03/15 1631  05/06/15 0905 05/07/15 1536  NA 126* 129* 134*  K 4.9 4.9 5.0  CL 89* 86* 95*  CO2 25 32 28  BUN <5* 10 17  CREATININE 1.01* 1.08* 1.16*  CALCIUM 8.8* 9.7 8.9  PROT  --  7.2  --   BILITOT  --  0.6  --   ALKPHOS  --  83  --   ALT  --  15  --   AST  --  22  --   GLUCOSE 108* 103* 126*    Imaging/Diagnostic Tests: No results found.  Almon Hercules, MD 05/10/2015, 11:39 AM PGY-1, [place] FPTS Intern pager: [phone], text pages welcome

## 2015-05-10 NOTE — Consult Note (Signed)
[facility] Face-to-Face Follow Up Note    Patient Identification: [name] MRN:  [id number] Principal Diagnosis: Schizophrenia [facility]) Diagnosis:   Patient Active Problem List   Diagnosis Date Noted  . Paranoid schizophrenia (HCC) [F20.0]     Priority: High  . Schizophrenia (HCC) [F20.9] 08/18/2010    Priority: High  . Chronic hypercapnic respiratory failure (HCC) [J96.12]   . Dyspnea [R06.00]   . Hypoxemia [R09.02]   . COPD exacerbation (HCC) [J44.1] 05/06/2015  . Asthmatic bronchitis , chronic (HCC) [J44.9, J45.909] 05/06/2015  . Hypoxia [R09.02]   . Acute respiratory distress (HCC) [R06.00]   . Encounter for preadmission testing [Z01.818]   . Diabetes (HCC) [E11.9] 12/01/2014  . Homicidal ideation [R45.850]   . Lethargy [R53.83]   . Wheezing [R06.2]   . 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 Asthma, chronic [J45.909]   . HIV disease (HCC) [B20]   . Psychosis [F29] 10/30/2014  . Psychoses [F29]   . Hyponatremia [E87.1] 10/22/2014  . Microscopic hematuria [R31.29] 01/29/2014  . Abdominal pain [R10.9] 12/12/2013  . Vulvar irritation [N90.89] 12/12/2013  . Tobacco use disorder [F17.200] 02/18/2013  . Hepatitis B infection [B16.9]   . Pre-ulcerative corn or callous [L84] 05/07/2012  . Urinary incontinence, nocturnal enuresis [N39.44] 05/07/2012  . Fecal incontinence [R15.9] 05/07/2012  . Systolic murmur [R01.1] 05/07/2012  . Routine health maintenance [Z00.00] 05/07/2012  . Asthma [J45.909] 08/18/2010  . HIV (human immunodeficiency virus infection) (HCC) [Z21] 08/18/2010  . Herpes simplex infection [B00.9] 08/18/2010    Total Time spent with patient: 35 minutes  Subjective:   [name] is a 55 y.o. female patient admitted with dyspnea.  PLAN: -Depakote Level within normal limits -Follow-up with psychiatric outpatient services with Social Work Consult -No current medication changes at this time; no other psychiatric issues noted -Dr. Lucianne Muss has reviewed this patient  and concurs with the plan  HPI:   On First Psychiatric Assessment:  Patient is 55 year old Afro-American female who has history of paranoid schizophrenia admitted due to dyspnea.  Psychiatry consult was called as patient is refusing to cooperate and wants to leave AMA.  Patient has significant history of multiple health issues including diabetes mellitus, HIV and COPD.  Patient refused to cooperate.  She appears very labile, easily irritable, agitated the start yelling and cursing when question asked.  She appears paranoid, disorganized and very labile.  She admitted taking psychiatric medication but did not provide much detail.  When I ask if she want to stay in the hospital for further treatment she scream at me and say yes.  However her mood remains very labile and inappropriate.  Though she denies any suicidal thoughts or homicidal thought but appears angry, irritable and paranoid.  Due to her behavior unable to get more information from her.  Most of the information was obtained from medical record.  Apparently patient has been admitted in the hospital due to paranoia and disorganized thinking.  Currently she is seeing psychiatrist at [facility].  When I ask what circumstances brought her to the hospital she scream again and replied that time sick.  Patient has no cognitive insight into her illness.  She's been very uncooperative and did not provide any cohesive reason.  Today: Patient alert, oriented X3, and cooperative with provider. Patient reports that she is "feeling better today". Patient reports that her appetite and sleep have improved and that she is "ready to go home." Patient reports that she will "follow-up with Vesta [facility]er" when she is discharged but  wants help scheduling appointment. She reports positive social support from her sister and reports currently living with her brother. She denies suicidal ideation, homicidal ideation, and auditory or visual hallucinations at this time. Patient denies  alcohol or drug use.   Past Medical History:  Past Medical History  Diagnosis Date  . GERD (gastroesophageal reflux disease)   . Schizophrenia (HCC)   . AIDS (HCC) 12-10-2007  . Herpes simplex   . History of cholecystectomy   . Cataract   . Nonspecific reaction to tuberculin skin test without active tuberculosis 11-2008 [facility]   . Seizures (HCC)   . Hepatitis B infection     followed by ID  . Chronic asthmatic bronchitis (HCC)   . Tobacco use   . Diabetes mellitus without complication (HCC)     type 2 with out complication    Past Surgical History  Procedure Laterality Date  . Cholecystectomy    . Leg surgery Right     s/p accident; "steel fell on the back of my leg"  . Tonsillectomy     Family History:  Family History  Problem Relation Age of Onset  . Diabetes Sister   . Cancer Sister     breast  . Diabetes Brother   . Hypertension Brother   . Cancer Mother     lung  . Hypertension Mother   . Heart disease Father    Family Psychiatric  History: Unknown Social History:  History  Alcohol Use No    Comment: Patient denies      History  Drug Use No    Comment: Patient denies    Social History   Social History  . Marital Status: Single    Spouse Name: N/A  . Number of Children: N/A  . Years of Education: N/A   Social History Main Topics  . Smoking status: Current Every Day Smoker -- 0.50 packs/day for 35 years    Types: Cigarettes    Start date: 04/10/1974  . Smokeless tobacco: Never Used     Comment: Previously smoked 2ppd, might start patch  . Alcohol Use: No     Comment: Patient denies   . Drug Use: No     Comment: Patient denies  . Sexual Activity: Not Currently     Comment: pt. declined condoms   Other Topics Concern  . None   Social History Narrative   The patient says she was born and raised in [place]. She says she currently lives in [place] with her brother. She says she has 1 son and is currently divorced. She was a very poor  historian and unable to give any further social history.   Allergies:  No Known Allergies  Labs:  Results for orders placed or performed during the hospital encounter of 05/06/15 (from the past 48 hour(s))  Glucose, capillary     Status: Abnormal   Collection Time: 05/08/15  5:12 PM  Result Value Ref Range   Glucose-Capillary 250 (H) 65 - 99 mg/dL  Glucose, capillary     Status: Abnormal   Collection Time: 05/08/15 10:03 PM  Result Value Ref Range   Glucose-Capillary 114 (H) 65 - 99 mg/dL  Glucose, capillary     Status: Abnormal   Collection Time: 05/09/15  7:47 AM  Result Value Ref Range   Glucose-Capillary 133 (H) 65 - 99 mg/dL  Glucose, capillary     Status: Abnormal   Collection Time: 05/09/15 11:43 AM  Result Value Ref Range   Glucose-Capillary  117 (H) 65 - 99 mg/dL  Glucose, capillary     Status: Abnormal   Collection Time: 05/09/15  4:54 PM  Result Value Ref Range   Glucose-Capillary 231 (H) 65 - 99 mg/dL  Glucose, capillary     Status: None   Collection Time: 05/09/15  9:50 PM  Result Value Ref Range   Glucose-Capillary 93 65 - 99 mg/dL  Glucose, capillary     Status: None   Collection Time: 05/10/15  7:52 AM  Result Value Ref Range   Glucose-Capillary 83 65 - 99 mg/dL  Glucose, capillary     Status: Abnormal   Collection Time: 05/10/15 12:26 PM  Result Value Ref Range   Glucose-Capillary 156 (H) 65 - 99 mg/dL    Current Facility-Administered Medications  Medication Dose Route Frequency Provider Last Rate Last Dose  . acetaminophen (TYLENOL) tablet 650 mg  650 mg Oral Q6H PRN Abram Sander, MD       Or  . acetaminophen (TYLENOL) suppository 650 mg  650 mg Rectal Q6H PRN Abram Sander, MD   650 mg at 05/06/15 1340  . albuterol (PROVENTIL) (2.5 MG/3ML) 0.083% nebulizer solution 2.5 mg  2.5 mg Nebulization Q2H PRN Abram Sander, MD      . Darunavir Ethanolate (PREZISTA) tablet 800 mg  800 mg Oral Q breakfast Gardiner Barefoot, MD   800 mg at 05/10/15 1151  .  divalproex (DEPAKOTE SPRINKLE) capsule 500 mg  500 mg Oral Q12H Leighton Roach McDiarmid, MD   500 mg at 05/10/15 1149  . emtricitabine-tenofovir AF (DESCOVY) 200-25 MG per tablet 1 tablet  1 tablet Oral Daily Gardiner Barefoot, MD   1 tablet at 05/10/15 1151  . enoxaparin (LOVENOX) injection 40 mg  40 mg Subcutaneous Q24H Abram Sander, MD   40 mg at 05/09/15 1724  . famotidine (PEPCID) tablet 20 mg  20 mg Oral BID Annamary Carolin, RPH   20 mg at 05/10/15 1203  . insulin aspart (novoLOG) injection 0-5 Units  0-5 Units Subcutaneous QHS Abram Sander, MD   0 Units at 05/06/15 2152  . insulin aspart (novoLOG) injection 0-9 Units  0-9 Units Subcutaneous TID WC Abram Sander, MD   3 Units at 05/09/15 1724  . ipratropium-albuterol (DUONEB) 0.5-2.5 (3) MG/3ML nebulizer solution 3 mL  3 mL Nebulization TID Leighton Roach McDiarmid, MD   3 mL at 05/10/15 1420  . LORazepam (ATIVAN) injection 1 mg  1 mg Intravenous Q4H PRN Leighton Roach McDiarmid, MD      . mometasone-formoterol (DULERA) 200-5 MCG/ACT inhaler 2 puff  2 puff Inhalation BID Leighton Roach McDiarmid, MD   2 puff at 05/10/15 0825  . ondansetron (ZOFRAN) injection 4 mg  4 mg Intravenous Q6H PRN Campbell Stall, MD   4 mg at 05/08/15 1052  . predniSONE (DELTASONE) tablet 50 mg  50 mg Oral Daily Abram Sander, MD   50 mg at 05/10/15 1203  . raltegravir (ISENTRESS) tablet 400 mg  400 mg Oral BID Gardiner Barefoot, MD   400 mg at 05/10/15 1150  . risperiDONE (RISPERDAL M-TABS) disintegrating tablet 3 mg  3 mg Oral BID Abram Sander, MD   3 mg at 05/10/15 1151  . ritonavir (NORVIR) tablet 100 mg  100 mg Oral Q breakfast Gardiner Barefoot, MD   100 mg at 05/10/15 0824  . sodium chloride flush (NS) 0.9 % injection 3 mL  3 mL Intravenous Q12H Abram Sander, MD  3 mL at 05/10/15 1408    Musculoskeletal: Strength & Muscle Tone: within normal limits Gait & Station: Lying on the bed Patient leans: N/A  Psychiatric Specialty Exam: Review of Systems  Psychiatric/Behavioral: The patient is  nervous/anxious and has insomnia.     Blood pressure 124/58, pulse 72, temperature 98.4 F (36.9 C), temperature source Oral, resp. rate 18, height 4\' 11"  (1.499 m), weight 68.4 kg (150 lb 12.7 oz), SpO2 96 %.Body mass index is 30.44 kg/(m^2).  General Appearance: Fairly Groomed  Patent attorney::  Fair  Speech:  Slow  Volume:  Normal  Mood:  Euthymic  Affect:  Congruent  Thought Process:  Circumstantial and Loose  Orientation:  Full (Time, Place, and Person)  Thought Content:  Rumination  Suicidal Thoughts:  No  Homicidal Thoughts:  No  Memory:  Immediate: Fair Recent: Fair Remote: Fair  Judgement:  Fair  Insight:  Lacking  Psychomotor Activity:  Increased  Concentration:  Fair  Recall:  Fair  Fund of Knowledge:Poor  Language: Fair  Akathisia:  No  Handed:  Right  AIMS (if indicated):     Assets:  Housing, Positive Social Support  ADL's:  Impaired  Cognition: Impaired,  Mild  Sleep:      Treatment Plan Summary: Patient condition is improved from past assessment. Diagnosis: Schizophrenia, undifferentiated  PLAN: -Crisis Stabilization -No current medication changes at this time; no other psychiatric issues noted -Recommend Social Work referral for outpatient psychiatric services   Disposition: Recommend Social Work referral for outpatient psychiatric services   Nanine Means PMH-NP 05/10/2015 3:51 PM

## 2015-05-10 NOTE — Discharge Instructions (Addendum)
It has been a pleasure taking care of you! You were admitted due to shortness of breath and cough. Your symptoms improved after we treated you with breathing medicines, steroid and antibiotics. We are discharging you on more of this medication and other additional medications that you need to continue taking after you leave the hospital. We might have made some adjustments to your other medications. Please, make sure to read the directions before you take them. The names and directions on how to take these medications are found on this discharge paper under medication section.  You also need a follow up with your primary care doctor and lung doctors. The address, date and time are found on the discharge paper under follow up section.  Take care,

## 2015-05-10 NOTE — Progress Notes (Signed)
Family Medicine [facility] Discharge Summary  Patient name: [name] Medical record number: [id number] Date of birth: Oct 27, 1960 Age: 55 y.o. Gender: female Date of Admission: 05/06/2015  Date of Discharge: 05/11/2015 Admitting Physician: Leighton Roach McDiarmid, MD  Primary Care Provider: Levert Feinstein, MD Consultants: CCM, ID, Psychiatry  Indication for Hospitalization: Acute respiratory failure  Discharge Diagnoses/Problem List:  Patient Active Problem List   Diagnosis Date Noted  . Chronic hypercapnic respiratory failure (HCC)   . HIV disease (HCC)   . Paranoid schizophrenia (HCC)   . Tobacco use disorder 02/18/2013  . Hepatitis B infection   . Herpes simplex infection 08/18/2010   Disposition: home  Discharge Condition: stable  Discharge Exam:  Filed Vitals:   05/10/15 1936 05/10/15 2210 05/11/15 0744 05/11/15 1705  BP:  125/82 129/70 125/54  Pulse:  75 60 60  Temp:  99.4 F (37.4 C) 98.8 F (37.1 C) 98.7 F (37.1 C)  TempSrc:  Oral Oral Oral  Resp:  Height:      Weight:      SpO2: 92% 100% 100% 97%   Gen: lying in bed, able to sit up for exam easily, cooperative and appropriate CV: regular rate and rythm. 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  Brief Hospital Course:  [name] is a 55 y.o. female presenting with dyspnea. PMH is significant for COPD, HIV, schizophrenia.  Acute respiratory failure with dyspnea/cough/hypoxia likely from asthma exacerbation:resolved. She desated to low 80's while on 6L by [place] in ED.  CXR normal. She received Solumedrol IV, CAT and Levaquin in ED with subsequent improvement in her symptoms.   On admission to hospital floor, she was wheezy bilaterally. She was  started on Duonebs, Dulera, Prednisone and Doxycycline.  Her breathing gotten worse the following morning. Her oxygen was increased to 6L by mask.  ABG obtained and showed  chronic resp acidosis (7.38/58/75/34) with appropriate bicarb.However, PFT in 05/2014 suggestive for ILD.  BNP 138 (63 in 12/2014) but didn't look fluid overloaded. CXR normal as well. Echo with EF of 60-65% but incomplete as patient didn't cooperate.  Critical medicine was consulted and helped with the management on the floor. Her breathing improved allowing oxygen titration.   On the day of discharge, patient sating at 100% on room air. She was ambulated on RA and maintained oxygen saturation over 95%. She had no dyspnea, chest pain or work of breathing. Lungs clear to auscultation bilaterally.   Patient had an isolated fever to 102 once during her stay. She didn't have leukocytosis. Opportunistic lung infections were considered but CD4 count 610 in 02/2015.  HIV: CD4 610, RNA quant 39 in November. ID was consulted and recommended continuing her ARV medications.  Schizophrenia: on depakote 500 mg once daily, Risperidone 3 mg daily and 50 mg IM once a month. Not sure if she has gotten the later recently.Depakote level within normal range. While on floor, patient became uncooprative with care. Psych consulted. Initially she was deemed incompetent. However, this chnanged on subsequent evaluation by psychiatry. They have also increased her depakote from 500 mg once daily to twice daily and recommended continuing home Risperdal. Depakote trough at 48 hours was within normal range.  Issues for Follow Up:  1. Schizophrenia: patient needs outpatient follow up with psychiatry. Depakote increased.  2. HIV: assess compliance with medication 3. Respiratory failure: assess breathing and compliance with medication. She has a follow up with Pulm on 05/31/2015  Significant Procedures: none  Significant Labs and Imaging:   Recent Labs Lab  05/06/15 0905 05/07/15 1536  WBC 10.0 10.1  HGB 14.8 13.5  HCT 42.8 41.6  PLT 267 265    Recent Labs Lab 05/06/15 0905 05/07/15 1536 05/10/15 1656  NA 129* 134* 135  K 4.9 5.0 5.0  CL 86* 95* 96*  CO2 32 28 29  GLUCOSE 103* 126* 131*  BUN 10 17 24*  CREATININE 1.08* 1.16* 1.07*  CALCIUM 9.7 8.9 9.2  ALKPHOS 83  --   --   AST 22  --   --   ALT 15  --   --   ALBUMIN 3.6  --   --    Dg Chest Port 1 View  05/06/2015  CLINICAL DATA:  Cough for 1 week EXAM: PORTABLE CHEST 1 VIEW COMPARISON:  05/03/2015 FINDINGS: The heart size and mediastinal contours are within normal limits. Both lungs are clear. The visualized skeletal structures are unremarkable. IMPRESSION: No active disease. Electronically Signed   By: Elige Ko   On: 05/06/2015 09:01   Ct Chest Wo Contrast  05/08/2015  IMPRESSION: 1. Trace bilateral pleural fluid noted. Underlying patchy pleural based airspace opacity noted bilaterally, likely reflecting atelectasis, though mild infection cannot be excluded. 2. Small to moderate hiatal hernia noted. Electronically Signed   By: Roanna Raider M.D.   On: 05/08/2015 02:14    Results/Tests Pending at Time of Discharge:   Discharge Medications:    Medication List    STOP taking these medications        budesonide-formoterol 80-4.5 MCG/ACT inhaler  Commonly known as:  SYMBICORT     predniSONE 50 MG tablet  Commonly known as:  DELTASONE      TAKE these medications        albuterol (2.5 MG/3ML) 0.083% nebulizer solution  Commonly known as:  PROVENTIL  INHALE CONTENTS OF 1 VIAL USING NEBULIZER EVERY 6 HOURS AS NEEDED FOR WHEEZING     Darunavir Ethanolate 800 MG tablet  Commonly known as:  PREZISTA  Take 1 tablet (800 mg total) by mouth daily.     divalproex 125 MG capsule  Commonly known as:  DEPAKOTE SPRINKLE  Take 4 capsules (500 mg total) by mouth 2 (two) times daily.     emtricitabine-tenofovir 200-300 MG tablet  Commonly known as:  TRUVADA  Take 1  tablet by mouth daily.     ISENTRESS 400 MG tablet  Generic drug:  raltegravir  TAKE ONE TABLET   BY MOUTH   TWICE A DAY     metFORMIN 500 MG tablet  Commonly known as:  GLUCOPHAGE  Take 1 tablet (500 mg total) by mouth 2 (two) times daily with a meal.     mometasone-formoterol 200-5 MCG/ACT Aero  Commonly known as:  DULERA  Inhale 2 puffs into the lungs 2 (two) times daily.     paliperidone 234 MG/1.5ML Susp injection  Commonly known as:  INVEGA SUSTENNA  Inject 234 mg into the muscle once.     polyethylene glycol packet  Commonly known as:  MIRALAX / GLYCOLAX  Take 17 g by mouth daily as needed for mild constipation.     ranitidine 150 MG tablet  Commonly known as:  ZANTAC  Take 1 tablet (150  mg total) by mouth 2 (two) times daily.     risperidone 3 MG disintegrating tablet  Commonly known as:  RISPERDAL M-TABS  Take 1 tablet (3 mg total) by mouth 2 (two) times daily.     risperiDONE microspheres 50 MG injection  Commonly known as:  RISPERDAL CONSTA  Inject 50 mg into the muscle every 30 (thirty) days.     ritonavir 100 MG Tabs tablet  Commonly known as:  NORVIR  Take 1 tablet (100 mg total) by mouth daily.        Discharge Instructions: Please refer to Patient Instructions section of EMR for full details.  Patient was counseled important signs and symptoms that should prompt return to medical care, changes in medications, dietary instructions, activity restrictions, and follow up appointments.   Follow-Up Appointments: Follow-up Information    Follow up with Rubye Oaks, NP On 05/31/2015.   Specialty:  Pulmonary Disease   Why:  Follow up with lung doctors at 3 pm.   Contact information:   [address] [place] [address] [phone]       Follow up with Levert Feinstein, MD On 05/18/2015.   Specialty:  Family Medicine   Why:  at 9:45 am. Hosp f/u on Acute resp failure   Contact information:   [address] [place]  [address] [phone]       Almon Hercules, MD 05/11/2015, 8:15 PM PGY-1, [facility]

## 2015-05-10 NOTE — Progress Notes (Signed)
PCCM PROGRESS NOTE  Subjective: She can't quit smoking.  She knows it's bad for her.  She is not feeling congested.  Vital Signs: BP 124/58 mmHg  Pulse 72  Temp(Src) 98.4 F (36.9 C) (Oral)  Resp 18  Ht  (1.499 m)  Wt 150 lb 12.7 oz (68.4 kg)  BMI 30.44 kg/m2  SpO2 98%  Intake/Outpt: I/O last 3 completed shifts: In: 672 [P.O.:572; IV Piggyback:100] Out: 3 [Urine:2; Stool:1]  General: alert HEENT: no sinus tenderness Cardiac: regular Chest: decreased BS, no wheeze Abd: soft, non tender Ext: no edema Neuro: follows commands Skin: no rashes   CBC Recent Labs     05/07/15  1536  WBC  10.1  HGB  13.5  HCT  41.6  PLT  265   BMET Recent Labs     05/07/15  1536  NA  134*  K  5.0  CL  95*  CO2  28  BUN  17  CREATININE  1.16*  GLUCOSE  126*    Electrolytes Recent Labs     05/07/15  1536  CALCIUM  8.9   ABG Recent Labs     05/07/15  1126  PHART  7.382  PCO2ART  57.9*  PO2ART  74.7*   Glucose Recent Labs     05/08/15  2203  05/09/15  0747  05/09/15  1143  05/09/15  1654  05/09/15  2150  05/10/15  0752  GLUCAP  114*  133*  117*  231*  93  83    Imaging No results found.   Tests: 1/28 CT chest >> atelectasis, hiatal hernia, trace b/l effusions 1/28 Echo >> EF 60 to 65%, mild AR, mild MR  Discussion: 55 yo female smoker with cough, dyspnea from exacerbation of chronic bronchitis/asthma.  She has hx of Schizophrenia, HIV, Hep B, GERD, DM.  Assessment/plan:  Acute exacerbation of chronic bronchitis/asthma. Tobacco abuse. - wean off prednisone as tolerated - continue duoenb - smoking cessation  Acute hypoxic respiratory failure. - adjust oxygen to keep SpO2 90 to 95%  I have scheduled her for follow up with Tammy Parrett in pulmonary office for May 31, 2015 at 3 pm.  Please call if additional help is needed while she is in hospital.  Coralyn Helling, MD [facility] Pulmonary/Critical Care 05/10/2015, 11:04 AM Pager:   [phone] After 3pm call: [phone]

## 2015-05-11 LAB — VALPROIC ACID LEVEL: VALPROIC ACID LVL: 65 ug/mL (ref 50.0–100.0)

## 2015-05-11 LAB — GLUCOSE, CAPILLARY
GLUCOSE-CAPILLARY: 87 mg/dL (ref 65–99)
Glucose-Capillary: 146 mg/dL — ABNORMAL HIGH (ref 65–99)
Glucose-Capillary: 126 mg/dL — ABNORMAL HIGH (ref 65–99)

## 2015-05-11 NOTE — Progress Notes (Signed)
Discharge papers/instructions given to patient's sister,verbalized understanding especially follow up appointments.Patient discharged home. Shantee Hayne, Drinda Butts, Charity fundraiser

## 2015-05-11 NOTE — Progress Notes (Signed)
Family Medicine Teaching Service Daily Progress Note Intern Pager: [phone]  Patient name: [name] Medical record number: [id number] Date of birth: November 17, 1960 Age: 55 y.o. Gender: female  Primary Care Provider: Levert Feinstein, MD Consultants: ID Code Status: Full  Pt Overview and Major Events to Date:  1/26:-admitted with Dyspnea/cough/hypoxia  Assessment and Plan: [name] is a 55 y.o. female presenting with dyspnea. PMH is significant for COPD, HIV, schizophrenia.  Acute respiratory failure with dyspnea/cough/hypoxia likely from asthma exacerbation: Improved. ABG with chronic resp acidosis 7.38/58/75/34.  CTA chest in 11/2014 with ground glass opacities and granulomas. 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 Patient with HIV. Doubt opportunistic infection with CD4 count 610 in 02/2015.  BNP 138 (63 in 12/2014) but doesn't look fluid up. CXR normal as well.  Not able to get good IV access for CTA. However, CT w/o contrast suggestive for atelectasis vs infection. Last fever on 1/26 to 102. Echo incomplete but EF of 60-65%. satiing well on RA overnight. No work of breathing. Lungs clear to auscultation bilaterally.  -S/p solumedrol IV, CAT albuterol and Levaquin in ED on 1/26. - Appreciate CCM recs  -Outpatient follow up on 02/20. Already set up - Appreciate ID recs:  -Continue HIV meds as below  -Doxycycline 100 mg twice a day (1/27>1/30) - Prednisone 1/27 >1/30) - Dulera 200/5 twice a day 1/27> - duonebs q4h  - albuterol q2 prn - Will ambulate without oxygen today   DM2: A1c 6.7 in August, on metformin alone. CBG within normal range. - resume metformin on discharge - sensitive SSI  HIV: CD4 610, RNA quant 39 in November.  - Appreicate ID recs  -Continue ARVs   - prezista, norvir, isentress and Descovy in place of Truvada which is on our formulary here  Schizophrenia: on depakote 500 mg once daily, Risperidone 3 mg daily and 50 mg IM once a month. Not sure if she has  gotten the later recently.Depakote level WNL on admission. -Appreciated psyc recs:  -patient stable for outpatient follow up.  -Depakote  500 mg twice a day on 01/29. Will check trough on 01/31 in the morning before her morning dose.  -Ativan as needed for severe agitation - Continue home risperdal  FEN/GI:  -carb mod diet,  -SLIV Prophylaxis: lovenox  Disposition: discharge pending oxygen requirement and psyc eval for capacity.  Subjective:  No complaints this morning. Denies shortness of breath or shortness of breath.  Objective: Temp:  [98.4 F (36.9 C)-99.4 F (37.4 C)] 98.8 F (37.1 C) (01/31 0744) Pulse Rate:  [60-75] 60 (01/31 0744) Resp:  [16-18] 16 (01/31 0744) BP: (116-129)/(58-82) 129/70 mmHg (01/31 0744) SpO2:  [92 %-100 %] 100 % (01/31 0744) Physical Exam: Gen: lying in bed, able to sit up for exam easily, cooperative and appropriate CV: regular rate and rythm. 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  Laboratory:  Recent Labs Lab 05/06/15 0905 05/07/15 1536  WBC 10.0 10.1  HGB 14.8 13.5  HCT 42.8 41.6  PLT 267 265    Recent Labs Lab 05/06/15 0905 05/07/15 1536 05/10/15 1656  NA 129* 134* 135  K 4.9 5.0 5.0  CL 86* 95* 96*  CO2 32 28 29  BUN 10 17 24*  CREATININE 1.08* 1.16* 1.07*  CALCIUM 9.7 8.9 9.2  PROT 7.2  --   --   BILITOT 0.6  --   --   ALKPHOS 83  --   --   ALT 15  --   --   AST 22  --   --   GLUCOSE 103* 126* 131*    Imaging/Diagnostic Tests: No results found.  Almon Hercules, MD 05/11/2015, 8:17 AM PGY-1, [facility] FPTS Intern pager: [phone], text pages welcome

## 2015-05-11 NOTE — Progress Notes (Signed)
SATURATION QUALIFICATIONS:  Patient Saturations on Room Air at Rest = 100%  Patient Saturations on Room Air while Ambulating = 95%   

## 2015-05-12 ENCOUNTER — Ambulatory Visit: Payer: Self-pay | Admitting: Family Medicine

## 2015-05-12 NOTE — Discharge Summary (Signed)
Family Medicine Teaching [facility] Discharge Summary  Patient name: [name] Medical record number: [id number] Date of birth: 1961/02/19 Age: 55 y.o. Gender: female Date of Admission: 05/06/2015  Date of Discharge: 05/11/2015 Admitting Physician: Leighton Roach McDiarmid, MD  Primary Care Provider: Levert Feinstein, MD Consultants: CCM, ID, and psychiatry  Indication for Hospitalization: acute respiratory failure  Discharge Diagnoses/Problem List:  Patient Active Problem List   Diagnosis Date Noted  . Chronic hypercapnic respiratory failure (HCC)   . HIV disease (HCC)   . Paranoid schizophrenia (HCC)   . Tobacco use disorder 02/18/2013  . Hepatitis B infection   . Herpes simplex infection 08/18/2010         Disposition: home  Discharge Condition: stable  Discharge exam:   Filed Vitals:   05/10/15 1936 05/10/15 2210 05/11/15 0744 05/11/15 1705  BP:  125/82 129/70 125/54  Pulse:  75 60 60  Temp:  99.4 F (37.4 C) 98.8 F (37.1 C) 98.7 F (37.1 C)  TempSrc:  Oral Oral Oral  Resp:  Height:      Weight:      SpO2: 92% 100% 100% 97%   Gen: lying in bed, able to sit up for exam easily, cooperative and appropriate CV: regular rate and rythm. 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  Brief Hospital Course:  [name] is a 55 y.o. female presenting with dyspnea. PMH is significant for COPD, HIV, schizophrenia.  Acute respiratory failure with dyspnea/cough/hypoxia likely from asthma exacerbation:resolved. She desated to low 80's while on 6L by [place] in ED. CXR normal. She received Solumedrol IV, CAT and Levaquin in ED with subsequent improvement in her  symptoms.   On admission to hospital floor, she was wheezy bilaterally. She was started on Duonebs, Dulera, Prednisone and Doxycycline. Her breathing gotten worse the following morning. Her oxygen was increased to 6L by mask. ABG obtained and showed chronic resp acidosis (7.38/58/75/34) with appropriate bicarb.However, PFT in 05/2014 suggestive for ILD. BNP 138 (63 in 12/2014) but didn't look fluid overloaded. CXR normal as well. Echo with EF of 60-65% but incomplete as patient didn't cooperate. Critical medicine was consulted and helped with the management on the floor. Her breathing improved allowing oxygen titration.   On the day of discharge, patient sating at 100% on room air. She was ambulated on RA and maintained oxygen saturation over 95%. She had no dyspnea, chest pain or work of breathing. Lungs clear to auscultation bilaterally.   Patient had an isolated fever to 102 once during her stay. She didn't have leukocytosis. Opportunistic lung infections were considered but CD4 count 610 in 02/2015.  HIV: CD4 610, RNA quant 39 in November. ID was consulted and recommended continuing her ARV medications.  Schizophrenia: on depakote 500 mg once daily, Risperidone 3 mg daily and 50 mg IM once a month. Not sure if she has gotten the later recently.Depakote level within normal range. While on floor, patient became uncooprative with care. Psych consulted. Initially she was deemed incompetent. However, this chnanged on subsequent evaluation by psychiatry. They have also increased her depakote from 500 mg once daily to twice daily and recommended continuing home Risperdal. Depakote trough at 48 hours was within normal range.  Issues for Follow  Up:  1. Schizophrenia: patient needs outpatient follow up with psychiatry. Depakote increased.  2. HIV: assess compliance with medication 3. Respiratory failure: assess breathing and compliance with medication. She has a follow up with Pulm on 05/31/2015      Significant Procedures: none  Significant Labs and Imaging:   Recent Labs Lab 05/06/15 0905 05/07/15 1536  WBC 10.0 10.1  HGB 14.8 13.5  HCT 42.8 41.6  PLT 267 265    Recent Labs Lab 05/06/15 0905 05/07/15 1536 05/10/15 1656  NA 129* 134* 135  K 4.9 5.0 5.0  CL 86* 95* 96*  CO2 32 28 29  GLUCOSE 103* 126* 131*  BUN 10 17 24*  CREATININE 1.08* 1.16* 1.07*  CALCIUM 9.7 8.9 9.2  ALKPHOS 83  --   --   AST 22  --   --   ALT 15  --   --   ALBUMIN 3.6  --   --    Dg Chest Port 1 View  05/06/2015 CLINICAL DATA: Cough for 1 week EXAM: PORTABLE CHEST 1 VIEW COMPARISON: 05/03/2015 FINDINGS: The heart size and mediastinal contours are within normal limits. Both lungs are clear. The visualized skeletal structures are unremarkable. IMPRESSION: No active disease. Electronically Signed By: Elige Ko On: 05/06/2015 09:01   Ct Chest Wo Contrast  05/08/2015 IMPRESSION: 1. Trace bilateral pleural fluid noted. Underlying patchy pleural based airspace opacity noted bilaterally, likely reflecting atelectasis, though mild infection cannot be excluded. 2. Small to moderate hiatal hernia noted. Electronically Signed By: Roanna Raider M.D. On: 05/08/2015 02:14    Results/Tests Pending at Time of Discharge: none  Discharge Medications:    Medication List    STOP taking these medications        budesonide-formoterol 80-4.5 MCG/ACT inhaler  Commonly known as:  SYMBICORT     predniSONE 50 MG tablet  Commonly known as:  DELTASONE      TAKE these medications        albuterol (2.5 MG/3ML) 0.083% nebulizer solution  Commonly known as:  PROVENTIL  INHALE CONTENTS OF 1 VIAL USING NEBULIZER EVERY 6 HOURS AS NEEDED FOR WHEEZING     Darunavir Ethanolate 800 MG tablet  Commonly known as:  PREZISTA  Take 1 tablet (800 mg total) by mouth daily.     divalproex 125 MG capsule  Commonly known as:  DEPAKOTE SPRINKLE  Take 4 capsules (500 mg total) by mouth 2 (two) times daily.      emtricitabine-tenofovir 200-300 MG tablet  Commonly known as:  TRUVADA  Take 1 tablet by mouth daily.     ISENTRESS 400 MG tablet  Generic drug:  raltegravir  TAKE ONE TABLET   BY MOUTH   TWICE A DAY     metFORMIN 500 MG tablet  Commonly known as:  GLUCOPHAGE  Take 1 tablet (500 mg total) by mouth 2 (two) times daily with a meal.     mometasone-formoterol 200-5 MCG/ACT Aero  Commonly known as:  DULERA  Inhale 2 puffs into the lungs 2 (two) times daily.     paliperidone 234 MG/1.5ML Susp injection  Commonly known as:  INVEGA SUSTENNA  Inject 234 mg into the muscle once.     polyethylene glycol packet  Commonly known as:  MIRALAX / GLYCOLAX  Take 17 g by mouth daily as needed for mild constipation.     ranitidine 150 MG tablet  Commonly known as:  ZANTAC  Take 1 tablet (150 mg total) by mouth 2 (two) times daily.  risperidone 3 MG disintegrating tablet  Commonly known as:  RISPERDAL M-TABS  Take 1 tablet (3 mg total) by mouth 2 (two) times daily.     risperiDONE microspheres 50 MG injection  Commonly known as:  RISPERDAL CONSTA  Inject 50 mg into the muscle every 30 (thirty) days.     ritonavir 100 MG Tabs tablet  Commonly known as:  NORVIR  Take 1 tablet (100 mg total) by mouth daily.        Discharge Instructions: Please refer to Patient Instructions section of EMR for full details.  Patient was counseled important signs and symptoms that should prompt return to medical care, changes in medications, dietary instructions, activity restrictions, and follow up appointments.   Follow-Up Appointments:     Follow-up Information    Follow up with Rubye Oaks, NP On 05/31/2015.   Specialty:  Pulmonary Disease   Why:  Follow up with lung doctors at 3 pm.   Contact information:   [address] [place] [address] [phone]       Follow up with Levert Feinstein, MD On 05/18/2015.   Specialty:  Family Medicine   Why:  at 9:45 am. Hosp f/u on Acute resp  failure   Contact information:   [address] [place] [address] [phone]       Almon Hercules, MD 05/12/2015, 5:16 PM PGY-1, [facility]

## 2015-05-18 ENCOUNTER — Inpatient Hospital Stay: Payer: Self-pay | Admitting: Family Medicine

## 2015-05-21 ENCOUNTER — Ambulatory Visit: Payer: Medicaid Other | Admitting: Podiatry

## 2015-05-27 ENCOUNTER — Ambulatory Visit: Payer: Self-pay | Admitting: Internal Medicine

## 2015-05-28 ENCOUNTER — Ambulatory Visit: Payer: Medicaid Other | Admitting: Podiatry

## 2015-05-31 ENCOUNTER — Inpatient Hospital Stay: Payer: Self-pay | Admitting: Pulmonary Disease

## 2015-05-31 ENCOUNTER — Inpatient Hospital Stay: Payer: Self-pay | Admitting: Adult Health

## 2015-06-06 ENCOUNTER — Encounter (HOSPITAL_COMMUNITY): Payer: Self-pay

## 2015-06-06 ENCOUNTER — Emergency Department (HOSPITAL_COMMUNITY): Payer: Medicaid Other

## 2015-06-06 ENCOUNTER — Inpatient Hospital Stay (HOSPITAL_COMMUNITY)
Admission: EM | Admit: 2015-06-06 | Discharge: 2015-06-08 | DRG: 190 | Disposition: A | Discharge status: Home / Self Care | Payer: Medicaid Other | Attending: Family Medicine | Admitting: Family Medicine

## 2015-06-06 DIAGNOSIS — B20 Human immunodeficiency virus [HIV] disease: Secondary | ICD-10-CM | POA: Diagnosis present

## 2015-06-06 DIAGNOSIS — Z9114 Patient's other noncompliance with medication regimen: Secondary | ICD-10-CM

## 2015-06-06 DIAGNOSIS — J9622 Acute and chronic respiratory failure with hypercapnia: Secondary | ICD-10-CM | POA: Diagnosis present

## 2015-06-06 DIAGNOSIS — Z716 Tobacco abuse counseling: Secondary | ICD-10-CM

## 2015-06-06 DIAGNOSIS — E119 Type 2 diabetes mellitus without complications: Secondary | ICD-10-CM | POA: Diagnosis present

## 2015-06-06 DIAGNOSIS — R404 Transient alteration of awareness: Secondary | ICD-10-CM

## 2015-06-06 DIAGNOSIS — K219 Gastro-esophageal reflux disease without esophagitis: Secondary | ICD-10-CM | POA: Diagnosis present

## 2015-06-06 DIAGNOSIS — R0600 Dyspnea, unspecified: Secondary | ICD-10-CM

## 2015-06-06 DIAGNOSIS — J441 Chronic obstructive pulmonary disease with (acute) exacerbation: Principal | ICD-10-CM | POA: Diagnosis present

## 2015-06-06 DIAGNOSIS — E871 Hypo-osmolality and hyponatremia: Secondary | ICD-10-CM | POA: Diagnosis present

## 2015-06-06 DIAGNOSIS — B1910 Unspecified viral hepatitis B without hepatic coma: Secondary | ICD-10-CM | POA: Diagnosis present

## 2015-06-06 DIAGNOSIS — R4182 Altered mental status, unspecified: Secondary | ICD-10-CM | POA: Diagnosis present

## 2015-06-06 DIAGNOSIS — F17210 Nicotine dependence, cigarettes, uncomplicated: Secondary | ICD-10-CM | POA: Diagnosis present

## 2015-06-06 DIAGNOSIS — F200 Paranoid schizophrenia: Secondary | ICD-10-CM | POA: Diagnosis present

## 2015-06-06 LAB — BASIC METABOLIC PANEL
ANION GAP: 12 (ref 5–15)
BUN: 7 mg/dL (ref 6–20)
CHLORIDE: 87 mmol/L — AB (ref 101–111)
CO2: 26 mmol/L (ref 22–32)
CREATININE: 0.89 mg/dL (ref 0.44–1.00)
Calcium: 8.9 mg/dL (ref 8.9–10.3)
GFR calc non Af Amer: >60 mL/min (ref >60)
Glucose, Bld: 160 mg/dL — ABNORMAL HIGH (ref 65–99)
POTASSIUM: 4.1 mmol/L (ref 3.5–5.1)
SODIUM: 125 mmol/L — AB (ref 135–145)

## 2015-06-06 LAB — CBC
HCT: 37.5 % (ref 36.0–46.0)
HEMOGLOBIN: 12.3 g/dL (ref 12.0–15.0)
MCH: 27 pg (ref 26.0–34.0)
MCHC: 32.8 g/dL (ref 30.0–36.0)
MCV: 82.4 fL (ref 78.0–100.0)
PLATELETS: 222 10*3/uL (ref 150–400)
RBC: 4.55 MIL/uL (ref 3.87–5.11)
RDW: 17.5 % — ABNORMAL HIGH (ref 11.5–15.5)
WBC: 5.6 10*3/uL (ref 4.0–10.5)

## 2015-06-06 LAB — D-DIMER, QUANTITATIVE (NOT AT ARMC)

## 2015-06-06 MED ORDER — ALBUTEROL (5 MG/ML) CONTINUOUS INHALATION SOLN
10.0000 mg/h | INHALATION_SOLUTION | RESPIRATORY_TRACT | Status: DC
  Administered 2015-06-06: 10 mg/h via RESPIRATORY_TRACT
  Filled 2015-06-06: qty 20

## 2015-06-06 MED ORDER — IPRATROPIUM BROMIDE 0.02 % IN SOLN
0.5000 mg | Freq: Once | RESPIRATORY_TRACT | Status: AC
  Administered 2015-06-06: 0.5 mg via RESPIRATORY_TRACT
  Filled 2015-06-06: qty 2.5

## 2015-06-06 MED ORDER — SODIUM CHLORIDE 0.9 % IV BOLUS (SEPSIS)
1000.0000 mL | Freq: Once | INTRAVENOUS | Status: AC
  Administered 2015-06-06: 1000 mL via INTRAVENOUS

## 2015-06-06 MED ORDER — DEXTROSE 5 % IV SOLN
500.0000 mg | Freq: Once | INTRAVENOUS | Status: AC
  Administered 2015-06-06: 500 mg via INTRAVENOUS
  Filled 2015-06-06: qty 500

## 2015-06-06 NOTE — ED Notes (Signed)
Dr Aleene Davidson at bedside

## 2015-06-06 NOTE — ED Notes (Addendum)
Pt arrived via EMS c/o SOB x 2 days, EMS gave  Solumedrol, 0.5 Atrovent,  Albuterol.  Pedal edema, wheezing throughout, pt denies any fever.

## 2015-06-06 NOTE — ED Notes (Signed)
Pt refusing to wear oxygen.  States she doesn't wear oxygen at home and it is irritating her.

## 2015-06-06 NOTE — ED Provider Notes (Signed)
CSN: [id number]     Arrival date & time 06/06/15  1919 History   First MD Initiated Contact with Patient 06/06/15 1923     Chief Complaint  Patient presents with  . Shortness of Breath     (Consider location/radiation/quality/duration/timing/severity/associated sxs/prior Treatment) Patient is a 55 y.o. female presenting with shortness of breath.  Shortness of Breath Severity:  Moderate Onset quality:  Gradual Duration:  1 week Timing:  Constant Progression:  Worsening Chronicity:  Recurrent Context: URI   Relieved by: Duonebs per ems. Worsened by:  Exertion and coughing Ineffective treatments:  Inhaler Associated symptoms: cough, sputum production and wheezing   Associated symptoms: no abdominal pain, no chest pain, no ear pain, no fever, no headaches, no rash, no sore throat and no vomiting   Risk factors: no hx of PE/DVT     Past Medical History  Diagnosis Date  . GERD (gastroesophageal reflux disease)   . Schizophrenia (HCC)   . AIDS (HCC) 12-10-2007  . Herpes simplex   . History of cholecystectomy   . Cataract   . Nonspecific reaction to tuberculin skin test without active tuberculosis 11-2008 [facility]   . Seizures (HCC)   . Hepatitis B infection     followed by ID  . Chronic asthmatic bronchitis (HCC)   . Tobacco use   . Diabetes mellitus without complication (HCC)     type 2 with out complication   Past Surgical History  Procedure Laterality Date  . Cholecystectomy    . Leg surgery Right     s/p accident; "steel fell on the back of my leg"  . Tonsillectomy     Family History  Problem Relation Age of Onset  . Diabetes Sister   . Cancer Sister     breast  . Diabetes Brother   . Hypertension Brother   . Cancer Mother     lung  . Hypertension Mother   . Heart disease Father    Social History  Substance Use Topics  . Smoking status: Current Every Day Smoker -- 0.50 packs/day for 35 years    Types: Cigarettes    Start date: 04/10/1974  . Smokeless tobacco:  Never Used     Comment: Previously smoked 2ppd, might start patch  . Alcohol Use: No     Comment: Patient denies    OB History    No data available     Review of Systems  Constitutional: Negative for fever, chills, appetite change and fatigue.  HENT: Negative for congestion, ear pain, facial swelling, mouth sores and sore throat.   Eyes: Negative for visual disturbance.  Respiratory: Positive for cough, sputum production, shortness of breath and wheezing. Negative for chest tightness.   Cardiovascular: Positive for leg swelling (L>R). Negative for chest pain and palpitations.  Gastrointestinal: Negative for nausea, vomiting, abdominal pain, diarrhea and blood in stool.  Endocrine: Negative for cold intolerance and heat intolerance.  Genitourinary: Negative for frequency, decreased urine volume and difficulty urinating.  Musculoskeletal: Negative for back pain and neck stiffness.  Skin: Negative for rash.  Neurological: Negative for dizziness, weakness, light-headedness and headaches.  All other systems reviewed and are negative.     Allergies  Review of patient's allergies indicates no known allergies.  Home Medications   Prior to Admission medications   Medication Sig Start Date End Date Taking? Authorizing Provider  albuterol (PROVENTIL) (2.5 MG/3ML) 0.083% nebulizer solution INHALE CONTENTS OF 1 VIAL USING NEBULIZER EVERY 6 HOURS AS NEEDED FOR WHEEZING 05/03/15   Cristal Deer  Lawyer, PA-C  Darunavir Ethanolate (PREZISTA) 800 MG tablet Take 1 tablet (800 mg total) by mouth daily. Patient not taking: Reported on 05/06/2015 11/19/14   Judyann Munson, MD  divalproex (DEPAKOTE SPRINKLE) 125 MG capsule Take 4 capsules (500 mg total) by mouth 2 (two) times daily. 05/10/15   Almon Hercules, MD  emtricitabine-tenofovir (TRUVADA) 200-300 MG per tablet Take 1 tablet by mouth daily. 11/19/14   Judyann Munson, MD  ISENTRESS 400 MG tablet TAKE ONE TABLET   BY MOUTH   TWICE A DAY 12/23/14   Judyann Munson, MD  metFORMIN (GLUCOPHAGE) 500 MG tablet Take 1 tablet (500 mg total) by mouth 2 (two) times daily with a meal. 02/15/15   [name], MD  mometasone-formoterol [facility]) 200-5 MCG/ACT AERO Inhale 2 puffs into the lungs 2 (two) times daily. 05/10/15   Almon Hercules, MD  paliperidone (INVEGA SUSTENNA) 234 MG/1.5ML SUSP injection Inject 234 mg into the muscle once. 12/18/14   Shari Prows, MD  polyethylene glycol (MIRALAX / GLYCOLAX) packet Take 17 g by mouth daily as needed for mild constipation. Patient not taking: Reported on 05/06/2015 02/25/15   [name], MD  ranitidine (ZANTAC) 150 MG tablet Take 1 tablet (150 mg total) by mouth 2 (two) times daily. Patient not taking: Reported on 05/06/2015 04/23/15   [name], MD  risperiDONE (RISPERDAL M-TABS) 3 MG disintegrating tablet Take 1 tablet (3 mg total) by mouth 2 (two) times daily. 11/23/14   Shari Prows, MD  risperiDONE microspheres (RISPERDAL CONSTA) 50 MG injection Inject 50 mg into the muscle every 30 (thirty) days.     Historical Provider, MD  ritonavir (NORVIR) 100 MG TABS tablet Take 1 tablet (100 mg total) by mouth daily. Patient not taking: Reported on 05/06/2015 11/19/14   Judyann Munson, MD   BP 103/69 mmHg  Pulse 75  Resp 20  SpO2 95% Physical Exam  Constitutional: She is oriented to person, place, and time. She appears well-developed and well-nourished. No distress.  HENT:  Head: Normocephalic and atraumatic.  Right Ear: External ear normal.  Left Ear: External ear normal.  Nose: Nose normal.  Eyes: Conjunctivae and EOM are normal. Pupils are equal, round, and reactive to light. Right eye exhibits no discharge. Left eye exhibits no discharge. No scleral icterus.  Neck: Normal range of motion. Neck supple.  Cardiovascular: Normal rate, regular rhythm and normal heart sounds.  Exam reveals no gallop and no friction rub.   No murmur heard. Pulmonary/Chest: No stridor. Tachypnea noted.  She is in respiratory distress. She has decreased breath sounds. She has wheezes.  Abdominal: Soft. She exhibits no distension. There is no tenderness.  Musculoskeletal: She exhibits no edema or tenderness.  L>R LE edema   Neurological: She is alert and oriented to person, place, and time.  Skin: Skin is warm and dry. No rash noted. She is not diaphoretic. No erythema.  Psychiatric: She has a normal mood and affect.    ED Course  Procedures (including critical care time) Labs Review Labs Reviewed  BASIC METABOLIC PANEL - Abnormal; Notable for the following:    Sodium 125 (*)    Chloride 87 (*)    Glucose, Bld 160 (*)    All other components within normal limits  CBC - Abnormal; Notable for the following:    RDW 17.5 (*)    All other components within normal limits  D-DIMER, QUANTITATIVE (NOT AT [facility])  I-STAT VENOUS BLOOD GAS, ED  Imaging Review Dg Chest 2 View  06/06/2015  CLINICAL DATA:  Shortness of breath.  Chronic COPD. EXAM: CHEST - 2 VIEW COMPARISON:  CT chest 05/08/2015. FINDINGS: The heart is enlarged. There is no edema or effusion to suggest failure. Mild emphysematous changes are noted. Minimal bibasilar airspace disease likely reflects atelectasis. The upper lung fields are clear. The visualized soft tissues and bony thorax are unremarkable. IMPRESSION: 1. Cardiomegaly without failure. 2. Minimal bibasilar airspace disease likely reflects atelectasis. Electronically Signed   By: Marin Roberts M.D.   On: 06/06/2015 20:47   I have personally reviewed and evaluated these images and lab results as part of my medical decision-making.   EKG Interpretation   Date/Time:  Sunday June 06 2015 19:32:44 EST Ventricular Rate:  102 PR Interval:  182 QRS Duration: 64 QT Interval:  336 QTC Calculation: 438 R Axis:   89 Text Interpretation:  Age not entered, assumed to be  55 years old for  purpose of ECG interpretation Sinus tachycardia No significant change  since  last tracing Confirmed by FLOYD MD, Reuel Boom [phone]) on 06/06/2015  7:47:23 PM      MDM   55 year old female with a history of COPD and HIV with a CD4 greater than 600 noted in November presents to the ED with 1 week of worsening shortness of breath as well as left leg swelling over the same time frame. Upon EMS arrival patient was smoking a cigarette. She was given Solu-Medrol and a DuoNeb in route which seemed to have some improvement in her symptoms.  On arrival the patient had decreased air movement with faint wheezes. She was given additional continuous DuoNeb's and azithromycin given her report of increased sputum production. D-dimer sent to assess for possible blood clot which is negative. Chest x-ray without evidence of pneumonia.  On reassessment the patient's air movement had improved however her mental status had changed. She was now belligerent and unable to remember why she presented to the ED in the first place.  Urinalysis, troponin, VBG, CT head ordered. Family medicine consulted for admission given acute change in mental status. They will admit the patient for continued management pending the results of the additional workup.  Diagnostic studies interpreted by me and use to my clinical decision-making.  Patient seen in conjunction with Dr. Adela Lank.  Final diagnoses:  Transient alteration of awareness  Dyspnea  COPD exacerbation [facility])        Drema Pry, MD 06/07/15 0052  Melene Plan, DO 06/07/15 1644

## 2015-06-07 ENCOUNTER — Inpatient Hospital Stay: Payer: Medicaid Other | Admitting: Pulmonary Disease

## 2015-06-07 ENCOUNTER — Observation Stay (HOSPITAL_COMMUNITY): Payer: Medicaid Other

## 2015-06-07 DIAGNOSIS — K219 Gastro-esophageal reflux disease without esophagitis: Secondary | ICD-10-CM | POA: Diagnosis present

## 2015-06-07 DIAGNOSIS — E119 Type 2 diabetes mellitus without complications: Secondary | ICD-10-CM | POA: Diagnosis present

## 2015-06-07 DIAGNOSIS — F200 Paranoid schizophrenia: Secondary | ICD-10-CM | POA: Diagnosis present

## 2015-06-07 DIAGNOSIS — J441 Chronic obstructive pulmonary disease with (acute) exacerbation: Principal | ICD-10-CM

## 2015-06-07 DIAGNOSIS — B20 Human immunodeficiency virus [HIV] disease: Secondary | ICD-10-CM | POA: Diagnosis present

## 2015-06-07 DIAGNOSIS — B1910 Unspecified viral hepatitis B without hepatic coma: Secondary | ICD-10-CM | POA: Diagnosis present

## 2015-06-07 DIAGNOSIS — R404 Transient alteration of awareness: Secondary | ICD-10-CM

## 2015-06-07 DIAGNOSIS — R0600 Dyspnea, unspecified: Secondary | ICD-10-CM

## 2015-06-07 DIAGNOSIS — R4182 Altered mental status, unspecified: Secondary | ICD-10-CM | POA: Diagnosis present

## 2015-06-07 DIAGNOSIS — J9622 Acute and chronic respiratory failure with hypercapnia: Secondary | ICD-10-CM | POA: Diagnosis present

## 2015-06-07 DIAGNOSIS — F17210 Nicotine dependence, cigarettes, uncomplicated: Secondary | ICD-10-CM | POA: Diagnosis present

## 2015-06-07 DIAGNOSIS — Z716 Tobacco abuse counseling: Secondary | ICD-10-CM | POA: Diagnosis not present

## 2015-06-07 DIAGNOSIS — Z9114 Patient's other noncompliance with medication regimen: Secondary | ICD-10-CM | POA: Diagnosis not present

## 2015-06-07 DIAGNOSIS — E871 Hypo-osmolality and hyponatremia: Secondary | ICD-10-CM | POA: Diagnosis present

## 2015-06-07 LAB — GLUCOSE, CAPILLARY
GLUCOSE-CAPILLARY: 115 mg/dL — AB (ref 65–99)
GLUCOSE-CAPILLARY: 144 mg/dL — AB (ref 65–99)
Glucose-Capillary: 188 mg/dL — ABNORMAL HIGH (ref 65–99)
Glucose-Capillary: 137 mg/dL — ABNORMAL HIGH (ref 65–99)

## 2015-06-07 LAB — RAPID URINE DRUG SCREEN, HOSP PERFORMED
AMPHETAMINES: NOT DETECTED
Barbiturates: NOT DETECTED
Benzodiazepines: NOT DETECTED
Cocaine: NOT DETECTED
OPIATES: NOT DETECTED
TETRAHYDROCANNABINOL: NOT DETECTED

## 2015-06-07 LAB — I-STAT VENOUS BLOOD GAS, ED
Acid-Base Excess: 2 mmol/L (ref 0.0–2.0)
Bicarbonate: 29.3 mEq/L — ABNORMAL HIGH (ref 20.0–24.0)
O2 Saturation: 67 %
PCO2 VEN: 52.8 mmHg — AB (ref 45.0–50.0)
PH VEN: 7.353 — AB (ref 7.250–7.300)
PO2 VEN: 37 mmHg (ref 30.0–45.0)
Patient temperature: 37
TCO2: 31 mmol/L (ref 0–100)

## 2015-06-07 LAB — URINALYSIS, ROUTINE W REFLEX MICROSCOPIC
BILIRUBIN URINE: NEGATIVE
Glucose, UA: NEGATIVE mg/dL
Hgb urine dipstick: NEGATIVE
KETONES UR: NEGATIVE mg/dL
LEUKOCYTES UA: NEGATIVE
NITRITE: NEGATIVE
PROTEIN: NEGATIVE mg/dL
Specific Gravity, Urine: 1.005 (ref 1.005–1.030)
pH: 6.5 (ref 5.0–8.0)

## 2015-06-07 LAB — COMPREHENSIVE METABOLIC PANEL
ALBUMIN: 3.3 g/dL — AB (ref 3.5–5.0)
ALK PHOS: 81 U/L (ref 38–126)
ALT: 14 U/L (ref 14–54)
ANION GAP: 10 (ref 5–15)
AST: 20 U/L (ref 15–41)
BILIRUBIN TOTAL: 0.5 mg/dL (ref 0.3–1.2)
BUN: 6 mg/dL (ref 6–20)
CALCIUM: 8.9 mg/dL (ref 8.9–10.3)
CO2: 27 mmol/L (ref 22–32)
CREATININE: 0.82 mg/dL (ref 0.44–1.00)
Chloride: 93 mmol/L — ABNORMAL LOW (ref 101–111)
GFR calc Af Amer: >60 mL/min (ref >60)
GFR calc non Af Amer: >60 mL/min (ref >60)
GLUCOSE: 161 mg/dL — AB (ref 65–99)
Potassium: 4.2 mmol/L (ref 3.5–5.1)
SODIUM: 130 mmol/L — AB (ref 135–145)
TOTAL PROTEIN: 6.5 g/dL (ref 6.5–8.1)

## 2015-06-07 LAB — TROPONIN I

## 2015-06-07 LAB — CBC
HCT: 38.1 % (ref 36.0–46.0)
Hemoglobin: 12.3 g/dL (ref 12.0–15.0)
MCH: 26.5 pg (ref 26.0–34.0)
MCHC: 32.3 g/dL (ref 30.0–36.0)
MCV: 82.1 fL (ref 78.0–100.0)
PLATELETS: 238 10*3/uL (ref 150–400)
RBC: 4.64 MIL/uL (ref 3.87–5.11)
RDW: 17.8 % — AB (ref 11.5–15.5)
WBC: 3.4 10*3/uL — ABNORMAL LOW (ref 4.0–10.5)

## 2015-06-07 LAB — T-HELPER CELLS (CD4) COUNT (NOT AT ARMC)
CD4 % Helper T Cell: 15 % — ABNORMAL LOW (ref 33–55)
CD4 T Cell Abs: 50 /uL — ABNORMAL LOW (ref 400–2700)

## 2015-06-07 LAB — ETHANOL

## 2015-06-07 LAB — BRAIN NATRIURETIC PEPTIDE: B Natriuretic Peptide: 204.9 pg/mL — ABNORMAL HIGH (ref 0.0–100.0)

## 2015-06-07 LAB — I-STAT TROPONIN, ED: TROPONIN I, POC: 0.01 ng/mL (ref 0.00–0.08)

## 2015-06-07 MED ORDER — METHYLPREDNISOLONE SODIUM SUCC 125 MG IJ SOLR
60.0000 mg | Freq: Every day | INTRAMUSCULAR | Status: DC
  Administered 2015-06-07: 60 mg via INTRAVENOUS
  Filled 2015-06-07: qty 2

## 2015-06-07 MED ORDER — ONDANSETRON 4 MG PO TBDP
4.0000 mg | ORAL_TABLET | Freq: Three times a day (TID) | ORAL | Status: DC | PRN
  Administered 2015-06-07 – 2015-06-08 (×2): 4 mg via ORAL
  Filled 2015-06-07 (×3): qty 1

## 2015-06-07 MED ORDER — RALTEGRAVIR POTASSIUM 400 MG PO TABS
400.0000 mg | ORAL_TABLET | Freq: Two times a day (BID) | ORAL | Status: DC
  Filled 2015-06-07: qty 1

## 2015-06-07 MED ORDER — ENOXAPARIN SODIUM 40 MG/0.4ML ~~LOC~~ SOLN
40.0000 mg | SUBCUTANEOUS | Status: DC
  Administered 2015-06-07: 40 mg via SUBCUTANEOUS
  Filled 2015-06-07 (×2): qty 0.4

## 2015-06-07 MED ORDER — DIVALPROEX SODIUM 125 MG PO CSDR
500.0000 mg | DELAYED_RELEASE_CAPSULE | Freq: Two times a day (BID) | ORAL | Status: DC
  Administered 2015-06-07 – 2015-06-08 (×3): 500 mg via ORAL
  Filled 2015-06-07 (×3): qty 4

## 2015-06-07 MED ORDER — INSULIN ASPART 100 UNIT/ML ~~LOC~~ SOLN
0.0000 [IU] | Freq: Three times a day (TID) | SUBCUTANEOUS | Status: DC
  Administered 2015-06-07: 2 [IU] via SUBCUTANEOUS

## 2015-06-07 MED ORDER — ACETAMINOPHEN 650 MG RE SUPP: 650.0000 mg | Freq: Four times a day (QID) | RECTAL | Status: DC | PRN

## 2015-06-07 MED ORDER — SODIUM CHLORIDE 0.9 % IV SOLN: 250.0000 mL | INTRAVENOUS | Status: DC | PRN

## 2015-06-07 MED ORDER — RITONAVIR 100 MG PO TABS
100.0000 mg | ORAL_TABLET | Freq: Every day | ORAL | Status: DC
  Administered 2015-06-07: 100 mg via ORAL
  Filled 2015-06-07: qty 1

## 2015-06-07 MED ORDER — SODIUM CHLORIDE 0.9% FLUSH: 3.0000 mL | INTRAVENOUS | Status: DC | PRN

## 2015-06-07 MED ORDER — LEVOFLOXACIN IN D5W 750 MG/150ML IV SOLN
750.0000 mg | Freq: Every day | INTRAVENOUS | Status: DC
  Filled 2015-06-07 (×2): qty 150

## 2015-06-07 MED ORDER — SULFAMETHOXAZOLE-TRIMETHOPRIM 800-160 MG PO TABS
1.0000 | ORAL_TABLET | Freq: Every day | ORAL | Status: DC
  Administered 2015-06-07: 1 via ORAL
  Filled 2015-06-07 (×2): qty 1

## 2015-06-07 MED ORDER — RISPERIDONE 1 MG PO TBDP
3.0000 mg | ORAL_TABLET | Freq: Two times a day (BID) | ORAL | Status: DC
  Administered 2015-06-07 – 2015-06-08 (×3): 3 mg via ORAL
  Filled 2015-06-07 (×3): qty 1

## 2015-06-07 MED ORDER — DARUNAVIR ETHANOLATE 800 MG PO TABS
800.0000 mg | ORAL_TABLET | Freq: Every day | ORAL | Status: DC
  Administered 2015-06-07: 800 mg via ORAL
  Filled 2015-06-07: qty 1

## 2015-06-07 MED ORDER — NICOTINE 14 MG/24HR TD PT24
14.0000 mg | MEDICATED_PATCH | Freq: Every day | TRANSDERMAL | Status: DC
  Filled 2015-06-07 (×2): qty 1

## 2015-06-07 MED ORDER — ONDANSETRON 8 MG PO TBDP
8.0000 mg | ORAL_TABLET | Freq: Once | ORAL | Status: AC
  Administered 2015-06-07: 8 mg via ORAL
  Filled 2015-06-07: qty 1

## 2015-06-07 MED ORDER — SODIUM CHLORIDE 0.9% FLUSH: 3.0000 mL | Freq: Two times a day (BID) | INTRAVENOUS | Status: DC

## 2015-06-07 MED ORDER — ACETAMINOPHEN 325 MG PO TABS: 650.0000 mg | ORAL_TABLET | Freq: Four times a day (QID) | ORAL | Status: DC | PRN

## 2015-06-07 MED ORDER — MOMETASONE FURO-FORMOTEROL FUM 200-5 MCG/ACT IN AERO
2.0000 | INHALATION_SPRAY | Freq: Two times a day (BID) | RESPIRATORY_TRACT | Status: DC
  Filled 2015-06-07: qty 8.8

## 2015-06-07 MED ORDER — IPRATROPIUM-ALBUTEROL 0.5-2.5 (3) MG/3ML IN SOLN
3.0000 mL | RESPIRATORY_TRACT | Status: DC
  Administered 2015-06-07: 3 mL via RESPIRATORY_TRACT
  Filled 2015-06-07: qty 3

## 2015-06-07 MED ORDER — ALBUTEROL SULFATE (2.5 MG/3ML) 0.083% IN NEBU
2.5000 mg | INHALATION_SOLUTION | RESPIRATORY_TRACT | Status: DC | PRN
  Administered 2015-06-08: 2.5 mg via RESPIRATORY_TRACT
  Filled 2015-06-07: qty 3

## 2015-06-07 MED ORDER — EMTRICITABINE-TENOFOVIR AF 200-25 MG PO TABS
1.0000 | ORAL_TABLET | Freq: Every day | ORAL | Status: DC
  Filled 2015-06-07: qty 1

## 2015-06-07 NOTE — ED Notes (Signed)
Pt woke up screaming, removed all clothing and monitoring equipment as well as oxygen.  Pt refusing to put monitoring equipment on at this time, pt refusing oxygen via [place] at this time.  Pt refusing bedside commode and walked out of room to use bathroom.  Pt agitated dr Eudelia Bunch notified.

## 2015-06-07 NOTE — Progress Notes (Signed)
RT Note: Pt refusing breathing treatment & Dulera. RT d/c HHN based on assessment & pt refusals. RT will continue to monitor.

## 2015-06-07 NOTE — Progress Notes (Signed)
Called into pt's room b/c pt was actively vomiting. Emesis apperared brown/clear liquid. After getting pt cleaned up a phlebotamist entered the room to draw labs. Pt became very aggitated and began using fowl language, appearing like she would strike the phlebotamist if they tried to approach her, stating "I'm 'expletive' tired of this, don't you come near me unless you have a butterfly needle". MD notified and aware. Pt now resting quietly. Will continue to monitor

## 2015-06-07 NOTE — Progress Notes (Signed)
Family Medicine Teaching Service Daily Progress Note Intern Pager: [phone]  Patient name: [name] Medical record number: [id number] Date of birth: 04-29-1960 Age: 55 y.o. Gender: female  Primary Care Provider: Levert Feinstein, MD Consultants: None Code Status: FULL  Assessment and Plan: [name] is a 55 y.o. female presenting with shortness of breath and altered mental status. PMH is significant for COPD, DM, HIV, Paranoid Schizophrenia, Hepatitis B, Tobacco Abuse.  # Acute Respiratory Failure, likely from COPD Exacerbation: WBC normal (5.6). D Dimer 0.27. CXR without infiltrate, mild atelectasis. Initially on continuous albuterol, transitioned off. Home medications include Dulera 2 puffs BID. Recently hospitalized from 1/26 to 05/11/15 for COPD Exacerbation. Echo with EF 60-65%, however not completed due to limited patient cooperation. BNP elevated to 204.9. Troponin negative. Was supposed to have Pulmonology follow up on 05/31/15. CT head with no abnormalities.  - Continuous pulse ox - Daily weights - Refusing O2 [place], monitoring - Levofloxacin (Day #3)  - Solumedrol (Day #3). Transition to Prednisone when able. - Refusing Duonebs q4hr, Albuterol q2hr PRN  # Vomiting: Patient with two episodes of vomiting after eating yesterday, one this AM after coughing. Symptoms improved when transitioned from regular diet to soft.  - Advance diet as tolerated  # Altered Mental Status: Acutely became uncooperative per ED. Discharge summary from 1/31 notes that patient was uncooperative throughout her last hospitalization, so suspect at baseline. Initially deemed incompetent by Psychiatry, but mental status improved prior to discharge. Hx of paranoid schizophrenia noted. Home medications include Risperdal and Depakote. UDS neg; UA with no signs of infection. Patient belligerent throughout the day yesterday, refusing lab draws, breathing treatment, and using foul language to nursing. Patient now  refusing all labs draws and IV meds.  - Continue home medications - Continue to monitor  # Hyponatremia: Sodium 125 in ED. 1L NS given in ED. Unable to repeat this AM as patient refusing lab draws.   - Holding fluids at this time given possible CHF component to dyspnea. Consider cautious fluids once weight obtained. - Continue to monitor  # DM2: A1C 6.5 in 04/2015. CBG 150 overnight.  - Sensitive SSI  # HIV: Last CD4 610. Home medications include Darunavir Ethanolate, Emtricitabine-Tenofovir, Isentress, Norvir, though patient admits to non-compliance. CD4 count 50 (down from 610 three months ago). Viral quant unable to be obtained due to patient refusal. Spoke with ID yesterday who recommended discontinuing IV meds to prevent resistance until she follows up in ID clinic again. Also recommended starting Bactrim as prophylaxis. - Outpatient ID f/u on discharge - Continue Bactrim DS qd  # Paranoid Schizophrenia: Home medication includes Risperdal, Depakote. - Continue home medication   # Tobacco Abuse: Continue to counsel on smoking cessation. Nicotine patch.  FEN/GI: Liquid diet  Prophylaxis: Lovenox  Disposition: likely home today as now tolerating PO  Subjective:  Patient continues to refuse labs and any IV medications. Had two episodes of vomiting yesterday which seemed to be related to food, and one episode of vomiting this AM after coughing.  Patient reports that she has been able to tolerate fluids and soft foods, and would like to go home today.  Objective: Temp:  [97.5 F (36.4 C)-99.3 F (37.4 C)] 97.5 F (36.4 C) (02/28 0535) Pulse Rate:  [61-76] 76 (02/28 0535) Resp:  [16-20] 16 (02/28 0535) BP: (110-123)/(58-64) 110/59 mmHg (02/28 0535) SpO2:  [89 %-97 %] 97 % (02/28 0535) Weight:  [130 lb 8.2 oz (59.2 kg)] 130 lb 8.2 oz (59.2 kg) (  02/28 0535) Physical Exam: General: lying in bed resting comfortably; partially eaten breakfast tray at bedside; in NAD Cardiovascular:  RRR, no murmurs appreciated Respiratory: CTAB, no wheezes or rhonchi, normal work of breathing Abdomen: soft, non-distended, non-tender, +BS Neuro: A&Ox3, no focal deficits Psych: labile mood (easily agitated), flat affect  Laboratory:  Recent Labs Lab 06/06/15 2055 06/07/15 0450  WBC 5.6 3.4*  HGB 12.3 12.3  HCT 37.5 38.1  PLT 222 238    Recent Labs Lab 06/06/15 2055 06/07/15 0450  NA 125* 130*  K 4.1 4.2  CL 87* 93*  CO2 26 27  BUN 7 6  CREATININE 0.89 0.82  CALCIUM 8.9 8.9  PROT  --  6.5  BILITOT  --  0.5  ALKPHOS  --  81  ALT  --  14  AST  --  20  GLUCOSE 160* 161*   Imaging/Diagnostic Tests: Dg Chest 2 View 06/06/2015 IMPRESSION: 1. Cardiomegaly without failure. 2. Minimal bibasilar airspace disease likely reflects atelectasis.   Ct Head Wo Contrast 06/07/2015 IMPRESSION: No acute intracranial process. Stable mild global parenchymal brain volume loss, advanced for age.   Marquette Saa, MD 06/08/2015, 9:02 AM PGY-1, [facility] Family Medicine FPTS Intern pager: [phone], text pages welcome

## 2015-06-07 NOTE — H&P (Signed)
Family Medicine Teaching [facility] Admission History and Physical Service Pager: [phone]  Patient name: [name] Medical record number: [id number] Date of birth: 1961-03-31 Age: 55 y.o. Gender: female  Primary Care Provider: Levert Feinstein, MD Consultants: None Code Status: Full  Chief Complaint: Shortness of Breath, Altered Mental Status  Assessment and Plan: [name] is a 55 y.o. female presenting with shortness of breath and altered mental status. PMH is significant for COPD, DM, HIV, Paranoid Schizophrenia, Hepatitis B, Tobacco Abuse.  # Acute Respiratory Failure, likely from COPD Exacerbation:  WBC normal (5.6). D Dimer 0.27. CXR without infiltrate, mild atelectasis. Initially on continuous albuterol, transitioned off. Home medications include Dulera 2 puffs BID. Recently hospitalized from 1/26 to 05/11/15 for COPD Exacerbation. Echo with EF 60-65%, however not completed due to limited patient cooperation. BNP elevated to 204.9. Troponin negative. Was supposed to have Pulmonology follow up on 05/31/15. - Admitted to [facility] Medicine Teaching Service, Dr. Pollie Meyer attending - Continuous pulse ox - Daily weights.  - Refusing O2 [place], monitoring - Follow up ABG. Note history of chronic respiratory acidosis. - Follow up CT head - Levofloxacin (Day #1) (Discontinue Azithromycin) - Solumedrol (Day #1). Transition to Prednisone when able. - Duonebs q4hr, Albuterol q2hr PRN - Consider lasix given increasing BNP and incomplete Echo.  - Consider CCM consult.  # Altered Mental Status: Acutely became uncooperative per ED. Discharge summary from 1/31 notes that [name] was uncooperative throughout her last hospitalization, so suspect at baseline. Initially deemed incompetent by Psychiatry, but mental status improved prior to discharge. History of Paranoid Schizophrenia noted. Home medications include Risperdal and Depakote. - Follow up Urinalysis - Follow up UDS - Continue  home medications. - Continue to monitor  # Hyponatremia: Sodium 125 in ED. 1L NS given in ED - Continue to monitor on BMP - Holding fluids at this time given possible CHF component to dyspnea. Consider cautious fluids once weight obtained.  # DM2: A1C 6.5 in 04/2015 - Sensitive SSI  # HIV: Last CD4 610. Home medications include Darunavir Ethanolate, Emtricitabine-Tenofovir, Isentress, Norvir - Continue home medications. Note Descovy will need to replace Truvada during hospitalization due to availability on hospital formulary. - Noncompliant with medications. Consult ID given risk of resistance. - Repeat CD4  # Paranoid Schizophrenia: Home medication includes Risperdal, Depakote. - Continue home medication   # Tobacco Abuse: Continue to council on smoking cessation. Nicotine patch.  FEN/GI: Heart Healthy Prophylaxis: Lovenox  Disposition: Home  History of Present Illness:  [name] is a 55 y.o. female presenting with increased shortness of breath and cough with sputum production x2 days. Notes improvement with breathing treatments given in ED. Also reports increasing edema in lower extremities. Denies chest pain, nausea, vomiting, fever. Smoker. History limited given mental status of patient, refusing to answer some questions or cooperate with part of exam.  Review Of Systems: Per HPI  Otherwise the remainder of the systems were negative.  Patient Active Problem List   Diagnosis Date Noted  . Chronic hypercapnic respiratory failure (HCC)   . Dyspnea   . Hypoxemia   . COPD exacerbation (HCC) 05/06/2015  . Asthmatic bronchitis , chronic (HCC) 05/06/2015  . Hypoxia   . Acute respiratory distress (HCC)   . Encounter for preadmission testing   . Diabetes (HCC) 12/01/2014  . Homicidal ideation   . Lethargy   . Wheezing   . Delusional disorder (HCC)   . Asthma, chronic   . HIV disease (HCC)   .  Paranoid schizophrenia (HCC)   . Psychosis 10/30/2014  . Psychoses   .  Hyponatremia 10/22/2014  . Microscopic hematuria 01/29/2014  . Abdominal pain 12/12/2013  . Vulvar irritation 12/12/2013  . Tobacco use disorder 02/18/2013  . Hepatitis B infection   . Pre-ulcerative corn or callous 05/07/2012  . Urinary incontinence, nocturnal enuresis 05/07/2012  . Fecal incontinence 05/07/2012  . Systolic murmur 05/07/2012  . Routine health maintenance 05/07/2012  . Asthma 08/18/2010  . HIV (human immunodeficiency virus infection) (HCC) 08/18/2010  . Schizophrenia (HCC) 08/18/2010  . Herpes simplex infection 08/18/2010   Past Medical History: Past Medical History  Diagnosis Date  . GERD (gastroesophageal reflux disease)   . Schizophrenia (HCC)   . AIDS (HCC) 12-10-2007  . Herpes simplex   . History of cholecystectomy   . Cataract   . Nonspecific reaction to tuberculin skin test without active tuberculosis 11-2008 [facility]   . Seizures (HCC)   . Hepatitis B infection     followed by ID  . Chronic asthmatic bronchitis (HCC)   . Tobacco use   . Diabetes mellitus without complication (HCC)     type 2 with out complication   Past Surgical History: Past Surgical History  Procedure Laterality Date  . Cholecystectomy    . Leg surgery Right     s/p accident; "steel fell on the back of my leg"  . Tonsillectomy     Social History: Social History  Substance Use Topics  . Smoking status: Current Every Day Smoker -- 0.50 packs/day for 35 years    Types: Cigarettes    Start date: 04/10/1974  . Smokeless tobacco: Never Used     Comment: Previously smoked 2ppd, might start patch  . Alcohol Use: No     Comment: Patient denies    Please also refer to relevant sections of EMR.  Family History: Family History  Problem Relation Age of Onset  . Diabetes Sister   . Cancer Sister     breast  . Diabetes Brother   . Hypertension Brother   . Cancer Mother     lung  . Hypertension Mother   . Heart disease Father    Allergies and Medications: No Known Allergies No  current facility-administered medications on file prior to encounter.   Current Outpatient Prescriptions on File Prior to Encounter  Medication Sig Dispense Refill  . albuterol (PROVENTIL) (2.5 MG/3ML) 0.083% nebulizer solution INHALE CONTENTS OF 1 VIAL USING NEBULIZER EVERY 6 HOURS AS NEEDED FOR WHEEZING 75 mL 5  . Darunavir Ethanolate (PREZISTA) 800 MG tablet Take 1 tablet (800 mg total) by mouth daily. (Patient not taking: Reported on 05/06/2015) 30 tablet 5  . divalproex (DEPAKOTE SPRINKLE) 125 MG capsule Take 4 capsules (500 mg total) by mouth 2 (two) times daily. 240 capsule 0  . emtricitabine-tenofovir (TRUVADA) 200-300 MG per tablet Take 1 tablet by mouth daily. 30 tablet 5  . ISENTRESS 400 MG tablet TAKE ONE TABLET   BY MOUTH   TWICE A DAY 60 tablet 5  . metFORMIN (GLUCOPHAGE) 500 MG tablet Take 1 tablet (500 mg total) by mouth 2 (two) times daily with a meal. 180 tablet 1  . mometasone-formoterol (DULERA) 200-5 MCG/ACT AERO Inhale 2 puffs into the lungs 2 (two) times daily. 1 Inhaler 0  . paliperidone (INVEGA SUSTENNA) 234 MG/1.5ML SUSP injection Inject 234 mg into the muscle once. 0.9 mL 0  . polyethylene glycol (MIRALAX / GLYCOLAX) packet Take 17 g by mouth daily as needed for mild constipation. (  Patient not taking: Reported on 05/06/2015) 30 each 5  . ranitidine (ZANTAC) 150 MG tablet Take 1 tablet (150 mg total) by mouth 2 (two) times daily. (Patient not taking: Reported on 05/06/2015) 60 tablet 2  . risperiDONE (RISPERDAL M-TABS) 3 MG disintegrating tablet Take 1 tablet (3 mg total) by mouth 2 (two) times daily. 60 tablet 0  . risperiDONE microspheres (RISPERDAL CONSTA) 50 MG injection Inject 50 mg into the muscle every 30 (thirty) days.     . ritonavir (NORVIR) 100 MG TABS tablet Take 1 tablet (100 mg total) by mouth daily. (Patient not taking: Reported on 05/06/2015) 30 tablet 5    Objective: BP 103/69 mmHg  Pulse 75  Resp 20  SpO2 95% Exam: General: 54yo female resting  comfortably in no apparent distress, sleepy Eyes: PERRLA, refuses to cooperate for EOM testing ENTM: Moist mucous membranes, pharynx clear Neck: no lymphadenopathy, supple Cardiovascular: S1 and S2 noted, regular rate and rhythm, as talking mumbling throughout entire exam limiting accuracy Respiratory: Decreased bilateral air movement, wheezing, no increased work of breathing noted, Room Air Abdomen: Bowel sounds noted, soft and nondistended, no tenderness MSK: No edema, scarring of right leg noted Skin: No rashes, warm Neuro: Refuses to cooperate for testing, moves upper and lower extremities bilaterally Psych: Sleepy but arousable, irritated  Labs and Imaging: CBC BMET   Recent Labs Lab 06/06/15 2055  WBC 5.6  HGB 12.3  HCT 37.5  PLT 222    Recent Labs Lab 06/06/15 2055  NA 125*  K 4.1  CL 87*  CO2 26  BUN 7  CREATININE 0.89  GLUCOSE 160*  CALCIUM 8.9    - BNP 204.9 - Troponin 0.03 - D Dimer normal - Alcohol <5  Dg Chest 2 View  06/06/2015  CLINICAL DATA:  Shortness of breath.  Chronic COPD. EXAM: CHEST - 2 VIEW COMPARISON:  CT chest 05/08/2015. FINDINGS: The heart is enlarged. There is no edema or effusion to suggest failure. Mild emphysematous changes are noted. Minimal bibasilar airspace disease likely reflects atelectasis. The upper lung fields are clear. The visualized soft tissues and bony thorax are unremarkable. IMPRESSION: 1. Cardiomegaly without failure. 2. Minimal bibasilar airspace disease likely reflects atelectasis. Electronically Signed   By: Marin Roberts M.D.   On: 06/06/2015 20:47   Ct Head Wo Contrast  06/07/2015  CLINICAL DATA:  Altered mental status. History of schizophrenia, aids, hepatitis-B, diabetes. EXAM: CT HEAD WITHOUT CONTRAST TECHNIQUE: Contiguous axial images were obtained from the base of the skull through the vertex without intravenous contrast. COMPARISON:  CT head December 18, 2014 FINDINGS: Mild ventriculomegaly on the basis of  global parenchymal brain volume loss, advanced for age though stable from prior imaging. No intraparenchymal hemorrhage, mass effect, midline shift or acute large vascular territory infarcts. No abnormal extra-axial fluid collections. Basal cisterns are patent. Mild calcific atherosclerosis of the carotid siphons. No skull fracture. Status post bilateral ocular lens implants. Imaged paranasal sinuses and mastoid air cells are well aerated. IMPRESSION: No acute intracranial process. Stable mild global parenchymal brain volume loss, advanced for age. Electronically Signed   By: Awilda Metro M.D.   On: 06/07/2015 01:35   Araceli Bouche, DO 06/07/2015, 12:16 AM PGY-2, [facility] FPTS Intern pager: [phone], text pages welcome

## 2015-06-07 NOTE — Care Management (Signed)
Utilization review completed. Jalisha Enneking, RN Case Manager [phone]. 

## 2015-06-07 NOTE — ED Notes (Signed)
Admitting MD at bedside.

## 2015-06-08 ENCOUNTER — Other Ambulatory Visit: Payer: Self-pay | Admitting: Student

## 2015-06-08 LAB — GLUCOSE, CAPILLARY
GLUCOSE-CAPILLARY: 150 mg/dL — AB (ref 65–99)
Glucose-Capillary: 123 mg/dL — ABNORMAL HIGH (ref 65–99)

## 2015-06-08 MED ORDER — SULFAMETHOXAZOLE-TRIMETHOPRIM 800-160 MG PO TABS: 1.0000 | ORAL_TABLET | Freq: Every day | ORAL | Status: DC

## 2015-06-08 MED ORDER — PREDNISONE 20 MG PO TABS: 40.0000 mg | ORAL_TABLET | Freq: Every day | ORAL | Status: AC

## 2015-06-08 MED ORDER — LEVOFLOXACIN 500 MG PO TABS: 500.0000 mg | ORAL_TABLET | Freq: Every day | ORAL | Status: DC

## 2015-06-08 MED ORDER — AZITHROMYCIN 500 MG PO TABS: 500.0000 mg | ORAL_TABLET | Freq: Every day | ORAL | Status: DC

## 2015-06-08 NOTE — Discharge Instructions (Signed)
You were admitted for a difficulty breathing due to COPD exacerbation (worsening of your COPD). You improved with breathing treatments.   We have made some changes to your HIV medications.   Please STOP taking the medications you were on for HIV before your admission (Truvada and Isentress).   It is VERY important to begin taking the two antibiotics we have prescribed (Bactrim and azithromycin) EVERY DAY. It is also EXTREMELY important to go to your infectious disease follow-up appointment.  Please continue to take prednisone (steroid) for the next four days, and levaquin (antibiotic) for the next 6 days.

## 2015-06-08 NOTE — Progress Notes (Signed)
Spoke with pt's brother about pt being discharged today; he stated that no one would be able to pick pt up until after 2300 today. Notified family medicine resident via amion. Pt has had no episodes of nausea or vomiting thus far, but continues to be noncompliant with medications and lab draws. Pt currently resting in chair with chair alarm on--Will continue to monitor.
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
## 2015-06-08 NOTE — Progress Notes (Signed)
Patient still refusing IV restart and all labs. Unable to give any IV medications. Patient had some vomiting earlier in beginning of shift which appeared to be triggered from her coughing. After 10pm meds patient rested comfortably without any more n/v. Will continue to monitor patient.

## 2015-06-08 NOTE — Progress Notes (Signed)
Able to get a voucher for pt to get a taxi. Instructions/education reviewed with pt and all questions answered. Pt's brother updated and pt will be transported out via wheelchair to taxi. Will continue to monitor

## 2015-06-10 ENCOUNTER — Ambulatory Visit: Payer: Self-pay | Admitting: Family Medicine

## 2015-06-14 ENCOUNTER — Inpatient Hospital Stay: Payer: Self-pay | Admitting: Family Medicine

## 2015-06-15 ENCOUNTER — Ambulatory Visit: Payer: Self-pay | Admitting: Internal Medicine

## 2015-06-15 ENCOUNTER — Other Ambulatory Visit: Payer: Self-pay | Admitting: Family Medicine

## 2015-06-15 ENCOUNTER — Other Ambulatory Visit: Payer: Self-pay | Admitting: Internal Medicine

## 2015-06-18 ENCOUNTER — Ambulatory Visit: Payer: Medicaid Other | Admitting: Podiatry

## 2015-06-21 ENCOUNTER — Ambulatory Visit (INDEPENDENT_AMBULATORY_CARE_PROVIDER_SITE_OTHER): Payer: Medicaid Other | Admitting: Family Medicine

## 2015-06-21 VITALS — BP 115/56 | HR 85 | Temp 97.5°F | Ht 59.0 in | Wt 130.7 lb

## 2015-06-21 DIAGNOSIS — Z21 Asymptomatic human immunodeficiency virus [HIV] infection status: Secondary | ICD-10-CM

## 2015-06-21 DIAGNOSIS — R0600 Dyspnea, unspecified: Secondary | ICD-10-CM

## 2015-06-21 DIAGNOSIS — F209 Schizophrenia, unspecified: Secondary | ICD-10-CM | POA: Diagnosis not present

## 2015-06-21 DIAGNOSIS — J441 Chronic obstructive pulmonary disease with (acute) exacerbation: Secondary | ICD-10-CM | POA: Diagnosis not present

## 2015-06-21 DIAGNOSIS — R011 Cardiac murmur, unspecified: Secondary | ICD-10-CM | POA: Diagnosis not present

## 2015-06-21 DIAGNOSIS — B20 Human immunodeficiency virus [HIV] disease: Secondary | ICD-10-CM

## 2015-06-21 MED ORDER — SULFAMETHOXAZOLE-TRIMETHOPRIM 800-160 MG PO TABS: 1.0000 | ORAL_TABLET | Freq: Every day | ORAL | Status: DC

## 2015-06-21 MED ORDER — AZITHROMYCIN 500 MG PO TABS: 500.0000 mg | ORAL_TABLET | Freq: Every day | ORAL | Status: DC

## 2015-06-21 NOTE — Assessment & Plan Note (Addendum)
Major issue for patient. Has ongoing follow up as outpatient with ACT team.  Sister will call us with her medication list since things may have recently changed. Encouraged patient to not wander off in search of cigarettes, beer. Asked sister to continue accompanying patient to medical appointments as the collateral information she provides is very helpful.

## 2015-06-21 NOTE — Assessment & Plan Note (Signed)
Counseled on why she was asked to stop HIV medications in the hospital (noncompliance and risk of resistance) She has follow up with ID scheduled - encouraged her to keep this appointment She reports motivation to get back on HIV medications Refill of azithromycin & bactrim given (CD4 count 50 in hospital)

## 2015-06-21 NOTE — Progress Notes (Signed)
Date of Visit: 06/21/2015   HPI:  Patient presents today for hospital follow up. She is accompanied by her sister.  Patient was hospitalized from 2/26 to 2/28 with COPD exacerbation. There had initially been some concern during that hospitalization for altered mental status, however patient was actually at her normal psychiatric baseline.  She reports since being discharged from the hospital, she's been doing well. Breathing well. Using albuterol about twice per week. Finished course of levaquin. Sister does report that patient gets shortness of breath with walking.  Continues to smoke heavily. Her family attempts to limit her cigarette use to one pack per day. According to her sister, if she cannot find cigarettes she wanders off through her neighborhood to get cigarettes. Has neighbors who give her cigarettes and beer. Does not drink every day, just some days when people give her beer.  Last saw her psychiatrist on Friday. Is followed by [facility] ACT team. Sister does not think any medication changes were made when patient saw the team psychiatrist.   Of note while she was hospitalized, she admitted to not reliably taking her HIV medications, so ID team was consulted by phone and recommended stopping HIV medications out of concern for developing resistance. CD4 count was 50, and patient started on azithromycin and bactrim at time of discharge for MAC and PCP prophylaxis, respectively.   ROS: See HPI.  PMFSH: history of HIV, hep B, HSV, diabetes, asthma/COPD, tobacco abuse  PHYSICAL EXAM: BP 115/56 mmHg  Pulse 85  Temp(Src) 97.5 F (36.4 C) (Oral)  Ht 4\' 11"  (1.499 m)  Wt 130 lb 11.2 oz (59.285 kg)  BMI 26.38 kg/m2 Gen: NAD, pleasant, cooperative. Talks loudly at baseline. HEENT: normocephalic, atraumatic. Moist mucous membranes  Heart: regular rate and rhythm. 2/6 murmur audible at right upper sternal border.  Lungs: diminished air movement throughout with very occasional  end expiratory wheeze. No crackles. Normal work of breathing, speaks in full sentences and ambulates without distress Neuro: speech loud and slightly pressured, otherwise grossly nonfocal Ext: atraumatic. No edema in bilateral lower extremities.   ASSESSMENT/PLAN:  HIV (human immunodeficiency virus infection) Counseled on why she was asked to stop HIV medications in the hospital (noncompliance and risk of resistance) She has follow up with ID scheduled - encouraged her to keep this appointment She reports motivation to get back on HIV medications Refill of azithromycin & bactrim given (CD4 count 50 in hospital)  COPD exacerbation (HCC) Improved subjectively from hospitalization. Using albuterol ~2 times per week. Lungs today with some wheezes and overall poor air movement. Continues to smoke heavily. Counseled on smoking cessation being key to her being healthy moving forward. Not interested in quitting. Psychiatric illness limits her insight into this. Refill of albuterol today. Follow up in 1 month.   Schizophrenia (HCC) Major issue for patient. Has ongoing follow up as outpatient with ACT team.  Sister will call us with her medication list since things may have recently changed. Encouraged patient to not wander off in search of cigarettes, beer. Asked sister to continue accompanying patient to medical appointments as the collateral information she provides is very helpful.  Murmur, cardiac Noted on exam today. Review of records reveals echo attempted in January while patient hospitalized, but was uncooperative so was not complete. Will schedule repeat echo to assess given dyspnea on exertion to rule out cardiac etiology. No signs of CHF on exam today (no crackles, volume overload).   FOLLOW UP: Follow up in 1 month for above  issues  [place] J. Pollie Meyer, MD [facility]

## 2015-06-21 NOTE — Assessment & Plan Note (Signed)
Improved subjectively from hospitalization. Using albuterol ~2 times per week. Lungs today with some wheezes and overall poor air movement. Continues to smoke heavily. Counseled on smoking cessation being key to her being healthy moving forward. Not interested in quitting. Psychiatric illness limits her insight into this. Refill of albuterol today. Follow up in 1 month.

## 2015-06-21 NOTE — Assessment & Plan Note (Signed)
Noted on exam today. Review of records reveals echo attempted in January while patient hospitalized, but was uncooperative so was not complete. Will schedule repeat echo to assess given dyspnea on exertion to rule out cardiac etiology. No signs of CHF on exam today (no crackles, volume overload).

## 2015-06-21 NOTE — Patient Instructions (Signed)
Scheduling ultrasound of heart (echocardiogram) Keep appointment with infectious disease doctor Stay on bactrim and azithromycin - sent these in for you  Follow up with me in 1 month.  Be well, Dr. Pollie MeyerMcIntyre

## 2015-06-22 ENCOUNTER — Telehealth: Payer: Self-pay | Admitting: Family Medicine

## 2015-06-22 MED ORDER — SULFAMETHOXAZOLE-TRIMETHOPRIM 800-160 MG PO TABS: 1.0000 | ORAL_TABLET | Freq: Every day | ORAL | Status: DC

## 2015-06-22 MED ORDER — AZITHROMYCIN 500 MG PO TABS: 500.0000 mg | ORAL_TABLET | Freq: Every day | ORAL | Status: DC

## 2015-06-22 MED ORDER — ALBUTEROL SULFATE (2.5 MG/3ML) 0.083% IN NEBU: INHALATION_SOLUTION | RESPIRATORY_TRACT | Status: DC

## 2015-06-22 NOTE — Telephone Encounter (Signed)
Called patient to ask about which prescriptions she needs. She reports [facility] did not get the antibiotics. I sent them yesterday & we got a confirmation of receipt. I have sent them in again today. Also sent in more albuterol for her. Patient appreciative.  Latrelle DodrillBrittany J Kaydi Kley, MD

## 2015-06-22 NOTE — Telephone Encounter (Signed)
Patient says that she saw the doctor yesterday and that her prescriptions have still not been sent to [facility].

## 2015-06-23 ENCOUNTER — Other Ambulatory Visit (HOSPITAL_COMMUNITY): Payer: Self-pay

## 2015-06-23 ENCOUNTER — Encounter: Payer: Self-pay | Admitting: Family Medicine

## 2015-06-28 ENCOUNTER — Other Ambulatory Visit: Payer: Self-pay | Admitting: Internal Medicine

## 2015-06-29 ENCOUNTER — Other Ambulatory Visit: Payer: Self-pay | Admitting: *Deleted

## 2015-06-30 MED ORDER — RANITIDINE HCL 150 MG PO TABS: 150.0000 mg | ORAL_TABLET | Freq: Two times a day (BID) | ORAL | Status: DC

## 2015-07-08 ENCOUNTER — Ambulatory Visit (HOSPITAL_COMMUNITY): Payer: Medicaid Other | Attending: Cardiovascular Disease

## 2015-07-08 ENCOUNTER — Other Ambulatory Visit: Payer: Self-pay

## 2015-07-08 DIAGNOSIS — Z720 Tobacco use: Secondary | ICD-10-CM | POA: Diagnosis not present

## 2015-07-08 DIAGNOSIS — B20 Human immunodeficiency virus [HIV] disease: Secondary | ICD-10-CM | POA: Diagnosis not present

## 2015-07-08 DIAGNOSIS — J449 Chronic obstructive pulmonary disease, unspecified: Secondary | ICD-10-CM | POA: Insufficient documentation

## 2015-07-08 DIAGNOSIS — R0600 Dyspnea, unspecified: Secondary | ICD-10-CM | POA: Insufficient documentation

## 2015-07-08 DIAGNOSIS — I340 Nonrheumatic mitral (valve) insufficiency: Secondary | ICD-10-CM | POA: Insufficient documentation

## 2015-07-08 DIAGNOSIS — I352 Nonrheumatic aortic (valve) stenosis with insufficiency: Secondary | ICD-10-CM | POA: Diagnosis not present

## 2015-07-08 DIAGNOSIS — F209 Schizophrenia, unspecified: Secondary | ICD-10-CM | POA: Insufficient documentation

## 2015-07-12 ENCOUNTER — Ambulatory Visit: Payer: Medicaid Other | Admitting: Podiatry

## 2015-07-13 ENCOUNTER — Ambulatory Visit (INDEPENDENT_AMBULATORY_CARE_PROVIDER_SITE_OTHER): Payer: Medicaid Other | Admitting: Internal Medicine

## 2015-07-13 ENCOUNTER — Encounter: Payer: Self-pay | Admitting: Internal Medicine

## 2015-07-13 VITALS — BP 125/65 | HR 66 | Temp 98.0°F | Ht 59.0 in | Wt 134.5 lb

## 2015-07-13 DIAGNOSIS — F203 Undifferentiated schizophrenia: Secondary | ICD-10-CM | POA: Diagnosis not present

## 2015-07-13 DIAGNOSIS — G250 Essential tremor: Secondary | ICD-10-CM

## 2015-07-13 DIAGNOSIS — B370 Candidal stomatitis: Secondary | ICD-10-CM

## 2015-07-13 DIAGNOSIS — B20 Human immunodeficiency virus [HIV] disease: Secondary | ICD-10-CM

## 2015-07-13 MED ORDER — FLUCONAZOLE 200 MG PO TABS: 200.0000 mg | ORAL_TABLET | Freq: Every day | ORAL | Status: DC

## 2015-07-13 NOTE — Progress Notes (Signed)
Patient ID: [name], female   DOB: 04/11/1960, 55 y.o.   MRN: [id number]       Patient ID: Jackie AlandDawn L Shivley, female   DOB: 07/21/1960, 55 y.o.   MRN: [id number]  HPI 55yo F with HIV disease, schizophrenia, cognitive delay. CD 4 count of 50/ VL 40 in setting of COPD exacerbation. Her family reports that she had intermittent adherence prior to being hospitalized for COPD. She was asked to stop taking hiv meds until we get genotype to determine her new regimen. In the meantime, she was given daily bactrim and daily azithromycin for oi prophylaxis.  She has still under care with ACt team and psychiatry but in the last  6 wk of having new tremor to left arm.   Outpatient Encounter Prescriptions as of 07/13/2015  Medication Sig  . albuterol (PROVENTIL) (2.5 MG/3ML) 0.083% nebulizer solution INHALE CONTENTS OF 1 VIAL USING NEBULIZER EVERY 6 HOURS AS NEEDED FOR WHEEZING  . azithromycin (ZITHROMAX) 500 MG tablet Take 1 tablet (500 mg total) by mouth daily.  . divalproex (DEPAKOTE SPRINKLE) 125 MG capsule TAKE 4 CAPSULES (500 MG TOTAL) BY MOUTH 2 (TWO) TIMES DAILY.  . DULERA 200-5 MCG/ACT AERO INHALE 2 PUFFS INTO THE LUNGS TWICE DAILY  . metFORMIN (GLUCOPHAGE) 500 MG tablet Take 1 tablet (500 mg total) by mouth 2 (two) times daily with a meal.  . ranitidine (ZANTAC) 150 MG tablet Take 1 tablet (150 mg total) by mouth 2 (two) times daily.  . risperiDONE (RISPERDAL M-TABS) 3 MG disintegrating tablet Take 1 tablet (3 mg total) by mouth 2 (two) times daily.  . risperiDONE microspheres (RISPERDAL CONSTA) 50 MG injection Inject 50 mg into the muscle every 30 (thirty) days.   Marland Kitchen. sulfamethoxazole-trimethoprim (BACTRIM DS,SEPTRA DS) 800-160 MG tablet Take 1 tablet by mouth daily.   No facility-administered encounter medications on file as of 07/13/2015.     Patient Active Problem List   Diagnosis Date Noted  . Murmur, cardiac 06/21/2015  . Altered mental status 06/07/2015  . Chronic hypercapnic respiratory  failure (HCC)   . Dyspnea   . Hypoxemia   . COPD exacerbation (HCC) 05/06/2015  . Asthmatic bronchitis , chronic (HCC) 05/06/2015  . Hypoxia   . Acute respiratory distress (HCC)   . Encounter for preadmission testing   . Diabetes (HCC) 12/01/2014  . Homicidal ideation   . Lethargy   . Wheezing   . Delusional disorder (HCC)   . Asthma, chronic   . HIV disease (HCC)   . Paranoid schizophrenia (HCC)   . Psychosis 10/30/2014  . Psychoses   . Hyponatremia 10/22/2014  . Microscopic hematuria 01/29/2014  . Abdominal pain 12/12/2013  . Vulvar irritation 12/12/2013  . Tobacco use disorder 02/18/2013  . Hepatitis B infection   . Pre-ulcerative corn or callous 05/07/2012  . Urinary incontinence, nocturnal enuresis 05/07/2012  . Fecal incontinence 05/07/2012  . Systolic murmur 05/07/2012  . Routine health maintenance 05/07/2012  . Asthma 08/18/2010  . HIV (human immunodeficiency virus infection) (HCC) 08/18/2010  . Schizophrenia (HCC) 08/18/2010  . Herpes simplex infection 08/18/2010     Health Maintenance Due  Topic Date Due  . FOOT EXAM  09/09/1969  . COLONOSCOPY  09/09/2009     Review of Systems + tremor, anxious. 10 point ros is otherwise negative Physical Exam   BP 125/65 mmHg  Pulse 66  Temp(Src) 98 F (36.7 C) (Oral)  Ht 4\' 11"  (1.499 m)  Wt 134 lb 8 oz (61.009 kg)  BMI  27.15 kg/m2 Physical Exam  Constitutional:  oriented to person, place, and time. appears well-developed and well-nourished. No distress.  HENT: [place]/AT, PERRLA, no scleral icterus Mouth/Throat: Oropharynx is clear and moist. No oropharyngeal exudate.  Cardiovascular: Normal rate, regular rhythm and normal heart sounds. Exam reveals no gallop and no friction rub.  No murmur heard.  Pulmonary/Chest: Effort normal and breath sounds normal. No respiratory distress.  has no wheezes.  Neck = supple, no nuchal rigidity Abdominal: Soft. Bowel sounds are normal.  exhibits no distension. There is no  tenderness.  Lymphadenopathy: no cervical adenopathy. No axillary adenopathy Neurological: alert and oriented to person, place, and time. + resting tremor to left hand Skin: Skin is warm and dry. No rash noted. No erythema.  Psychiatric: a normal mood and affect.  behavior is normal.   Lab Results  Component Value Date   CD4TCELL 15* 06/07/2015   Lab Results  Component Value Date   CD4TABS 50* 06/07/2015   CD4TABS 610 02/11/2015   CD4TABS 670 07/14/2014   Lab Results  Component Value Date   HIV1RNAQUANT 39* 02/11/2015   No results found for: HEPBSAB No results found for: RPR  CBC Lab Results  Component Value Date   WBC 3.4* 06/07/2015   RBC 4.64 06/07/2015   HGB 12.3 06/07/2015   HCT 38.1 06/07/2015   PLT 238 06/07/2015   MCV 82.1 06/07/2015   MCH 26.5 06/07/2015   MCHC 32.3 06/07/2015   RDW 17.8* 06/07/2015   LYMPHSABS 2.9 05/06/2015   MONOABS 0.9 05/06/2015   EOSABS 0.0 05/06/2015   BASOSABS 0.0 05/06/2015   BMET Lab Results  Component Value Date   NA 130* 06/07/2015   K 4.2 06/07/2015   CL 93* 06/07/2015   CO2 27 06/07/2015   GLUCOSE 161* 06/07/2015   BUN 6 06/07/2015   CREATININE 0.82 06/07/2015   CALCIUM 8.9 06/07/2015   GFRNONAA >60 06/07/2015   GFRAA >60 06/07/2015     Assessment and Plan  hiv disease= will check viral load. Genotype. Have her come back in 3 wk so that we can start new regimen. We will keep her on oi proph of bactrim daily and change azithromycin to  weekly. i suspect her cd 4 count was artificially low in setting of acute infection but it is unclear if she had detectable viral load at that time since it was not drawn.  Tremor = concern for extra=pyramidal effects from psych medications. I have called the office Dr. Otho Najjar, [facility] to see promptly. Maybe transfer [place] behavior. [phone]. Act team to bring client into Dr Mariel Kansky office  Ginette Pitman = fluconazole  x 7 d  Will call her sister, brid[place]te at  end of the week to see what changes have been made at dr. Mariel Kansky office.

## 2015-07-15 ENCOUNTER — Encounter: Payer: Self-pay | Admitting: Family Medicine

## 2015-07-22 ENCOUNTER — Encounter: Payer: Self-pay | Admitting: Family Medicine

## 2015-07-22 ENCOUNTER — Ambulatory Visit (INDEPENDENT_AMBULATORY_CARE_PROVIDER_SITE_OTHER): Payer: Medicaid Other | Admitting: Family Medicine

## 2015-07-22 ENCOUNTER — Other Ambulatory Visit: Payer: Self-pay | Admitting: *Deleted

## 2015-07-22 ENCOUNTER — Other Ambulatory Visit: Payer: Medicaid Other

## 2015-07-22 VITALS — BP 127/97 | HR 98 | Temp 98.0°F | Ht 59.0 in | Wt 133.0 lb

## 2015-07-22 DIAGNOSIS — R931 Abnormal findings on diagnostic imaging of heart and coronary circulation: Secondary | ICD-10-CM | POA: Diagnosis not present

## 2015-07-22 DIAGNOSIS — E119 Type 2 diabetes mellitus without complications: Secondary | ICD-10-CM | POA: Diagnosis not present

## 2015-07-22 DIAGNOSIS — R011 Cardiac murmur, unspecified: Secondary | ICD-10-CM

## 2015-07-22 DIAGNOSIS — J4520 Mild intermittent asthma, uncomplicated: Secondary | ICD-10-CM | POA: Diagnosis not present

## 2015-07-22 DIAGNOSIS — B20 Human immunodeficiency virus [HIV] disease: Secondary | ICD-10-CM

## 2015-07-22 LAB — COMPLETE METABOLIC PANEL WITH GFR
ALT: 5 U/L — AB (ref 6–29)
AST: 13 U/L (ref 10–35)
Albumin: 4.2 g/dL (ref 3.6–5.1)
Alkaline Phosphatase: 70 U/L (ref 33–130)
BUN: 12 mg/dL (ref 7–25)
CALCIUM: 9.6 mg/dL (ref 8.6–10.4)
CO2: 20 mmol/L (ref 20–31)
CREATININE: 1.21 mg/dL — AB (ref 0.50–1.05)
Chloride: 92 mmol/L — ABNORMAL LOW (ref 98–110)
GFR, EST AFRICAN AMERICAN: 58 mL/min — AB (ref >=60)
GFR, Est Non African American: 50 mL/min — ABNORMAL LOW (ref >=60)
Glucose, Bld: 72 mg/dL (ref 65–99)
Potassium: 5.3 mmol/L (ref 3.5–5.3)
Sodium: 128 mmol/L — ABNORMAL LOW (ref 135–146)
Total Bilirubin: 0.4 mg/dL (ref 0.2–1.2)
Total Protein: 6.9 g/dL (ref 6.1–8.1)

## 2015-07-22 LAB — POCT GLYCOSYLATED HEMOGLOBIN (HGB A1C): Hemoglobin A1C: 5.6

## 2015-07-22 NOTE — Progress Notes (Signed)
Date of Visit: 07/22/2015   HPI:  Patient presents for follow up. She is accompanied by her sister.  Asthma/COPD - Overall reports she's doing well. Not needing albuterol.  Echo results - echo showed grade 1 diastolic dysfunction, normal systolic function. Possible bicuspid aortic valve with restricted mobility, moderate regurgitation and stenosis. 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 Discussed recommendation of referral to cardiology. Patient does not want referral. Denies dyspnea on exertion, swelling, fever, syncope, chest pain, trouble breathing. States she does not want to see heart doctor.   Diabetes - tolerating metformin. Has podiatrist for foot care. Utd on eye exam.  Health maintenance - does not want colonoscopy or stool cards for colon cancer screening.  ROS: See HPI.  PMFSH: history of HIV, hep B, schizophrenia, tobacco abuse  PHYSICAL EXAM: BP 127/97 mmHg  Pulse 98  Temp(Src) 98 F (36.7 C) (Oral)  Ht 4\' 11"  (1.499 m)  Wt 133 lb (60.328 kg)  BMI 26.85 kg/m2 Gen: NAD, pleasant, cooperative HEENT: ncat Heart:  Regular rate and rhyth Lungs: clear to auscultation bilaterally, normal work of breathing  Neuro: alert, grossly nonfocal, speech normal Ext: No appreciable lower extremity edema bilaterally  Diabetic foot exam: - normal monofilament testing bilaterally - normal DP pulses bilaterally - Skin: Right lateral aspect of foot with large preulcerative callous along side of foot. Left heel with large callous with cracked skin. No drainage.  ASSESSMENT/PLAN:  Health maintenance:  -declines colonoscopy or stool cards. Will readdress in 1 year.  Diabetes A1c 5.6, excellent control. Continue metformin  Cardiac: discuss statin at next visit Renal: has upcoming labs pending with ID Eye: utd Foot: exam done today, needs ongoing follow up with podiatry due to large calluses. Would recommend diabetic shoes, will  allow podiatrist to coordinate   Asthma Doing well. Continued to encourage smoking cessation.  Murmur, cardiac Reviewed echo results with patient and sister today. Strongly encouraged referral to cardiology for abnormalities noted with aortic & mitral valves. Patient declined referral today. As she is hemodynamically stable and denies any symptoms from these abnormalities I don't think referral is mandatory right now. Will continue to address in future visits.    FOLLOW UP: Follow up in 3 months for chronic medical issues Follow up with podiatry Patient to contact us if she agrees to cardiology referral  [name], MD [facility]

## 2015-07-22 NOTE — Patient Instructions (Signed)
Keep your feet moisturized Follow up with foot doctor - recommend you try diabetic shoes. They can help you get these.  Let me know if you change your mind about seeing a cardiologist  See me in 3 months, sooner if needed  Be well, Dr. Pollie MeyerMcIntyre

## 2015-07-23 LAB — T-HELPER CELL (CD4) - (RCID CLINIC ONLY)
CD4 % Helper T Cell: 25 % — ABNORMAL LOW (ref 33–55)
CD4 T Cell Abs: 840 /uL (ref 400–2700)

## 2015-07-24 DIAGNOSIS — R931 Abnormal findings on diagnostic imaging of heart and coronary circulation: Secondary | ICD-10-CM | POA: Insufficient documentation

## 2015-07-24 NOTE — Assessment & Plan Note (Signed)
A1c 5.6, excellent control. Continue metformin  Cardiac: discuss statin at next visit Renal: has upcoming labs pending with ID Eye: utd Foot: exam done today, needs ongoing follow up with podiatry due to large calluses. Would recommend diabetic shoes, will allow podiatrist to coordinate

## 2015-07-24 NOTE — Assessment & Plan Note (Signed)
Doing well. Continued to encourage smoking cessation.

## 2015-07-24 NOTE — Assessment & Plan Note (Signed)
Reviewed echo results with patient and sister today. Strongly encouraged referral to cardiology for abnormalities noted with aortic & mitral valves. Patient declined referral today. As she is hemodynamically stable and denies any symptoms from these abnormalities I don't think referral is mandatory right now. Will continue to address in future visits.

## 2015-07-28 LAB — HIV-1 RNA QUANT-NO REFLEX-BLD

## 2015-07-31 ENCOUNTER — Other Ambulatory Visit: Payer: Self-pay | Admitting: Family Medicine

## 2015-08-02 ENCOUNTER — Other Ambulatory Visit: Payer: Self-pay | Admitting: Family Medicine

## 2015-08-05 ENCOUNTER — Other Ambulatory Visit: Payer: Medicaid Other

## 2015-08-05 ENCOUNTER — Other Ambulatory Visit: Payer: Self-pay | Admitting: *Deleted

## 2015-08-05 DIAGNOSIS — B20 Human immunodeficiency virus [HIV] disease: Secondary | ICD-10-CM

## 2015-08-06 LAB — HIV-1 RNA QUANT-NO REFLEX-BLD
HIV 1 RNA QUANT: 112 {copies}/mL — AB (ref <20)
HIV-1 RNA QUANT, LOG: 2.05 {Log_copies}/mL — AB (ref <1.30)

## 2015-08-09 ENCOUNTER — Telehealth: Payer: Self-pay | Admitting: Family Medicine

## 2015-08-09 ENCOUNTER — Ambulatory Visit: Payer: Medicaid Other | Admitting: Podiatry

## 2015-08-09 NOTE — Telephone Encounter (Signed)
Pt is calling to speak to one of Dr. Valorie RooseveltMcIntyre's nurse about how she is feeling and what can be done until her appointment 08/18/15. Jackie Jacobsonjw

## 2015-08-11 NOTE — Telephone Encounter (Signed)
Pt stated that she didn't need anything and she will see dr mcintyre on Pollie MeyerWednesday. Jaydon Soroka Bruna PotterBlount, CMA

## 2015-08-18 ENCOUNTER — Ambulatory Visit (INDEPENDENT_AMBULATORY_CARE_PROVIDER_SITE_OTHER): Payer: Medicaid Other | Admitting: Family Medicine

## 2015-08-18 ENCOUNTER — Ambulatory Visit (HOSPITAL_COMMUNITY)
Admission: RE | Admit: 2015-08-18 | Discharge: 2015-08-18 | Disposition: A | Discharge status: Home / Self Care | Payer: Medicaid Other | Source: Ambulatory Visit | Attending: Family Medicine | Admitting: Family Medicine

## 2015-08-18 ENCOUNTER — Telehealth: Payer: Self-pay | Admitting: *Deleted

## 2015-08-18 ENCOUNTER — Encounter: Payer: Self-pay | Admitting: Family Medicine

## 2015-08-18 VITALS — BP 156/99 | HR 76 | Temp 98.3°F | Ht 59.0 in | Wt 125.0 lb

## 2015-08-18 DIAGNOSIS — K219 Gastro-esophageal reflux disease without esophagitis: Secondary | ICD-10-CM

## 2015-08-18 DIAGNOSIS — R0789 Other chest pain: Secondary | ICD-10-CM | POA: Insufficient documentation

## 2015-08-18 DIAGNOSIS — R001 Bradycardia, unspecified: Secondary | ICD-10-CM | POA: Diagnosis not present

## 2015-08-18 MED ORDER — PANTOPRAZOLE SODIUM 40 MG PO TBEC: 40.0000 mg | DELAYED_RELEASE_TABLET | Freq: Every day | ORAL | Status: DC

## 2015-08-18 NOTE — Telephone Encounter (Signed)
Tried to contact (phone just rang) pt to let her know that she left her cane in the room at her visit today, placed it upfront with her name on it for her to pick up if she calls back. Lamonte SakaiZimmerman Rumple, Ronnel Zuercher D, [place]

## 2015-08-18 NOTE — Patient Instructions (Signed)
Sent in acid reflux medicine for you  Follow up with me in 2-3 weeks Sooner if not getting better  Be well, Dr. Pollie Meyer    Gastroesophageal Reflux Disease, Adult Normally, food travels down the esophagus and stays in the stomach to be digested. However, when a person has gastroesophageal reflux disease (GERD), food and stomach acid move back up into the esophagus. When this happens, the esophagus becomes sore and inflamed. Over time, GERD can create small holes (ulcers) in the lining of the esophagus.  CAUSES This condition is caused by a problem with the muscle between the esophagus and the stomach (lower esophageal sphincter, or LES). Normally, the LES muscle closes after food passes through the esophagus to the stomach. When the LES is weakened or abnormal, it does not close properly, and that allows food and stomach acid to go back up into the esophagus. The LES can be weakened by certain dietary substances, medicines, and medical conditions, including:  Tobacco use.  Pregnancy.  Having a hiatal hernia.  Heavy alcohol use.  Certain foods and beverages, such as coffee, chocolate, onions, and peppermint. RISK FACTORS This condition is more likely to develop in:  People who have an increased body weight.  People who have connective tissue disorders.  People who use NSAID medicines. SYMPTOMS Symptoms of this condition include:  Heartburn.  Difficult or painful swallowing.  The feeling of having a lump in the throat.  Abitter taste in the mouth.  Bad breath.  Having a large amount of saliva.  Having an upset or bloated stomach.  Belching.  Chest pain.  Shortness of breath or wheezing.  Ongoing (chronic) cough or a night-time cough.  Wearing away of tooth enamel.  Weight loss. Different conditions can cause chest pain. Make sure to see your health care provider if you experience chest pain. DIAGNOSIS Your health care provider will take a medical history  and perform a physical exam. To determine if you have mild or severe GERD, your health care provider may also monitor how you respond to treatment. You may also have other tests, including:  An endoscopy toexamine your stomach and esophagus with a small camera.  A test thatmeasures the acidity level in your esophagus.  A test thatmeasures how much pressure is on your esophagus.  A barium swallow or modified barium swallow to show the shape, size, and functioning of your esophagus. TREATMENT The goal of treatment is to help relieve your symptoms and to prevent complications. Treatment for this condition may vary depending on how severe your symptoms are. Your health care provider may recommend:  Changes to your diet.  Medicine.  Surgery. HOME CARE INSTRUCTIONS Diet  Follow a diet as recommended by your health care provider. This may involve avoiding foods and drinks such as:  Coffee and tea (with or without caffeine).  Drinks that containalcohol.  Energy drinks and sports drinks.  Carbonated drinks or sodas.  Chocolate and cocoa.  Peppermint and mint flavorings.  Garlic and onions.  Horseradish.  Spicy and acidic foods, including peppers, chili powder, curry powder, vinegar, hot sauces, and barbecue sauce.  Citrus fruit juices and citrus fruits, such as oranges, lemons, and limes.  Tomato-based foods, such as red sauce, chili, salsa, and pizza with red sauce.  Fried and fatty foods, such as donuts, french fries, potato chips, and high-fat dressings.  High-fat meats, such as hot dogs and fatty cuts of red and white meats, such as rib eye steak, sausage, ham, and bacon.  High-fat  dairy items, such as whole milk, butter, and cream cheese.  Eat small, frequent meals instead of large meals.  Avoid drinking large amounts of liquid with your meals.  Avoid eating meals during the 2-3 hours before bedtime.  Avoid lying down right after you eat.  Do not exercise  right after you eat. General Instructions  Pay attention to any changes in your symptoms.  Take over-the-counter and prescription medicines only as told by your health care provider. Do not take aspirin, ibuprofen, or other NSAIDs unless your health care provider told you to do so.  Do not use any tobacco products, including cigarettes, chewing tobacco, and e-cigarettes. If you need help quitting, ask your health care provider.  Wear loose-fitting clothing. Do not wear anything tight around your waist that causes pressure on your abdomen.  Raise (elevate) the head of your bed 6 inches (15cm).  Try to reduce your stress, such as with yoga or meditation. If you need help reducing stress, ask your health care provider.  If you are overweight, reduce your weight to an amount that is healthy for you. Ask your health care provider for guidance about a safe weight loss goal.  Keep all follow-up visits as told by your health care provider. This is important. SEEK MEDICAL CARE IF:  You have new symptoms.  You have unexplained weight loss.  You have difficulty swallowing, or it hurts to swallow.  You have wheezing or a persistent cough.  Your symptoms do not improve with treatment.  You have a hoarse voice. SEEK IMMEDIATE MEDICAL CARE IF:  You have pain in your arms, neck, jaw, teeth, or back.  You feel sweaty, dizzy, or light-headed.  You have chest pain or shortness of breath.  You vomit and your vomit looks like blood or coffee grounds.  You faint.  Your stool is bloody or black.  You cannot swallow, drink, or eat.   This information is not intended to replace advice given to you by your health care provider. Make sure you discuss any questions you have with your health care provider.   Document Released: 01/04/2005 Document Revised: 12/16/2014 Document Reviewed: 07/22/2014 Elsevier Interactive Patient Education [employer]2016 [employer]

## 2015-08-18 NOTE — Progress Notes (Signed)
Date of Visit: 08/18/2015   HPI:  Patient presents to discuss acid reflux. Accompanied by her sister.  For the last several weeks has had frequent vomiting after eating. Estimates vomits about 2-3 times per day. No fevers, blood in stool, abdominal pain. Has had some diarrhea for about 1 week. Endorses indigestion type discomfort in her chest, radiating to her back sometimes. Not worse with exertion. Takes tums, which help. Also trying pepto bismol and gingerale. She thinks these symptoms are due to acid reflux. Presently takes zantac but not PPI. Tolerating foods like chicken broth and toast. Symptoms are worse if she eats spicy foods.  Breathing is overall doing well. Not needing albuterol at all. Continues to smoke 1.5 ppd.   Has history of HIV and reports taking truvada and isentriss. These medications aren't on her current medication list but states they were restarted by ID.   ROS: See HPI.  PMFSH: history of diabetes, hep B, HIV, schizophrenia, asthma/COPD  PHYSICAL EXAM: BP 156/99 mmHg  Pulse 76  Temp(Src) 98.3 F (36.8 C) (Oral)  Ht 4\' 11"  (1.499 m)  Wt 125 lb (56.7 kg)  BMI 25.23 kg/m2  SpO2 98% Gen: NAD, pleasant, cooperative, well appearing HEENT: normocephalic, atraumatic, moist mucous membranes. Oropharynx clear and moist Heart: regular rate and rhythm. 2/6 systolic murmur present Lungs: clear to auscultation bilaterally, normal work of breathing  Abdomen: normoactive bowel sounds, soft, nontender to palpation, no masses or organomegaly Neuro: alert, grossly nonfocal, speech normal Ext: No appreciable lower extremity edema bilaterally   ASSESSMENT/PLAN:  GERD (gastroesophageal reflux disease) Suspect symptoms are related to GERD. Her weight is down, which is clearly a red flag. Exam overall benign today. EKG performed today given cardiac risk factors (smoking, diabetes) but was unremarkable. Will add PPI to H2 blocker. Follow up in 2-3 weeks to ensure doing better.  If not improving or continues with weight loss, will need referral for likely EGD given her risk factors for esophageal disease (HIV, smoking).   FOLLOW UP: Follow up in 2-3 weeks for GERD  [name], MD [facility]

## 2015-08-19 DIAGNOSIS — K219 Gastro-esophageal reflux disease without esophagitis: Secondary | ICD-10-CM | POA: Insufficient documentation

## 2015-08-19 NOTE — Assessment & Plan Note (Signed)
Suspect symptoms are related to GERD. Her weight is down, which is clearly a red flag. Exam overall benign today. EKG performed today given cardiac risk factors (smoking, diabetes) but was unremarkable. Will add PPI to H2 blocker. Follow up in 2-3 weeks to ensure doing better. If not improving or continues with weight loss, will need referral for likely EGD given her risk factors for esophageal disease (HIV, smoking).

## 2015-08-20 ENCOUNTER — Ambulatory Visit: Payer: Medicaid Other | Admitting: Podiatry

## 2015-08-20 ENCOUNTER — Encounter: Payer: Self-pay | Admitting: Podiatry

## 2015-08-20 ENCOUNTER — Ambulatory Visit (INDEPENDENT_AMBULATORY_CARE_PROVIDER_SITE_OTHER): Payer: Medicaid Other | Admitting: Podiatry

## 2015-08-20 DIAGNOSIS — L853 Xerosis cutis: Secondary | ICD-10-CM

## 2015-08-20 DIAGNOSIS — L84 Corns and callosities: Secondary | ICD-10-CM | POA: Diagnosis not present

## 2015-08-24 DIAGNOSIS — L853 Xerosis cutis: Secondary | ICD-10-CM | POA: Insufficient documentation

## 2015-08-24 NOTE — Progress Notes (Signed)
Patient ID: [name], female   DOB: 11/03/1960, 55 y.o.   MRN: [id number]  Subjective: Patient presents for painful, recurrent calluses bottom of her foot. She has pain became with pressure mostly with shoe gear. Denies any redness or drainage. She also has dry heels. No fissuring or bleeding. No recent injury or trauma. No swelling or redness. No other complaints.  Objective: AAO 3, NAD DP/PT pulses 2/4 , CRT less than 3 seconds  Protective sensation intact with Dorann OuSimms Weinstein monofilament Hyperkeratotic lesion right foot submetatarsal 5 with mild tenderness to palpation. Upon debridement no underlying ulceration, drainage or other signs of infection. No other open lesions or pre-ulcerative lesions. Xerosis to bilateral heels. No open lesion. No drainage or bleeding.  No other areas of tenderness to bilateral lower extremities. No overlying edema, erythema, increase in warmth No pain with calf compression, swelling, warmth, erythema  Assessment:  55 year old female with right submetatarsal 5 hyperkeratotic lesion, xerosis   Plan: -Treatment options discussed including all alternatives, risks, and complications -Lesions debrided without complication/bleeding. -Recommended moisturizer to the heels daily. If there is any bleeding use antibiotic ointment and to call the office. -Discussed daily foot inspection -Follow-up as needed. In the meantime, encouraged to call the office with any questions, concerns, change in symptoms.   Ovid CurdMatthew Wagoner, DPM

## 2015-09-01 ENCOUNTER — Ambulatory Visit (INDEPENDENT_AMBULATORY_CARE_PROVIDER_SITE_OTHER): Payer: Medicaid Other | Admitting: Family Medicine

## 2015-09-01 VITALS — BP 107/69 | HR 93 | Temp 98.8°F | Ht 59.0 in | Wt 125.0 lb

## 2015-09-01 DIAGNOSIS — K219 Gastro-esophageal reflux disease without esophagitis: Secondary | ICD-10-CM

## 2015-09-01 MED ORDER — RANITIDINE HCL 150 MG PO TABS: 150.0000 mg | ORAL_TABLET | Freq: Two times a day (BID) | ORAL | Status: DC

## 2015-09-01 NOTE — Patient Instructions (Signed)
Refilled zantac -take this twice daily in addition to the protonix once per day  Follow up with me in 1 month to recheck your weight  Let us know when you're ready to quit smoking  Be well, Dr. Pollie MeyerMcIntyre

## 2015-09-01 NOTE — Progress Notes (Signed)
Date of Visit: 09/01/2015   HPI:  Patient presents to follow up on acid reflux.  Last visit her weight was down and we started protonix. She reports feeling much better now. Feels basically back to normal, just a little bit of hearburn for which she takes tums or pepto. No vomiting. She is unsure if she is still taking zantac. No diarrhea. No blood in stool. Has stopped drinking coffee, continues to smoke. Not interested in quitting.  ROS: See HPI.  PMFSH: history of diabetes, hep B, HIV, schizophrenia  PHYSICAL EXAM: BP 107/69 mmHg  Pulse 93  Temp(Src) 98.8 F (37.1 C) (Oral)  Ht 4\' 11"  (1.499 m)  Wt 125 lb (56.7 kg)  BMI 25.23 kg/m2 Gen: no acute distress, pleasant, cooperative HEENT: normocephalic, atraumatic, moist mucous membranes  Heart: regular rate and rhythm Lungs: clear to auscultation bilaterally, normal work of breathing Abdomen: normoactive bowel sounds, soft, nontender to palpation  Neuro: alert, calm, grossly nonfocal Ext: No appreciable lower extremity edema bilaterally   ASSESSMENT/PLAN:  GERD (gastroesophageal reflux disease) Improved on protonix daily. Weight stable since last visit Refill zantac to be sure she's taking that twice daily as well Follow up with me in 1 month to ensure weight still stable   FOLLOW UP: Follow up in 23mo for weight check & GERD  [name], MD [facility]

## 2015-09-01 NOTE — Assessment & Plan Note (Signed)
Improved on protonix daily. Weight stable since last visit Refill zantac to be sure she's taking that twice daily as well Follow up with me in 1 month to ensure weight still stable

## 2015-09-13 ENCOUNTER — Telehealth: Payer: Self-pay | Admitting: Family Medicine

## 2015-09-14 NOTE — Telephone Encounter (Signed)
Red team, Please call patient & her pharmacy to advise that this request (azithromycin) should go to her Infectious Diseases doctor.  The dose requested is different than the dose last mentioned in ID note. Refill should ideally come from them since they manage that aspect of her care  Thanks Jackie DodrillBrittany J Cullen Lahaie, MD

## 2015-09-15 NOTE — Telephone Encounter (Signed)
Called patient, no answer, no voicemail. Will try again later

## 2015-09-16 NOTE — Telephone Encounter (Signed)
Called again, no answer, no voicemail. 

## 2015-09-21 ENCOUNTER — Other Ambulatory Visit: Payer: Self-pay | Admitting: Internal Medicine

## 2015-09-21 ENCOUNTER — Other Ambulatory Visit: Payer: Self-pay | Admitting: Family Medicine

## 2015-09-22 ENCOUNTER — Other Ambulatory Visit: Payer: Self-pay | Admitting: Internal Medicine

## 2015-09-22 ENCOUNTER — Other Ambulatory Visit: Payer: Self-pay | Admitting: *Deleted

## 2015-09-22 MED ORDER — AZITHROMYCIN 600 MG PO TABS: 1200.0000 mg | ORAL_TABLET | ORAL | Status: DC

## 2015-09-22 NOTE — Telephone Encounter (Signed)
Dr Drue SecondSnider this medication request was sent by pharmacy. Your last note indicates she was to discontinue HIV medications until genotype results.   Was the genotype ordered here?  I see one order but it is pended as waiting for specimen. Please review and advise on medication request.    Laurell Josephsammy K Brinlynn Gorton, RN

## 2015-09-23 ENCOUNTER — Encounter: Payer: Self-pay | Admitting: *Deleted

## 2015-09-23 ENCOUNTER — Other Ambulatory Visit: Payer: Self-pay | Admitting: Infectious Diseases

## 2015-09-23 NOTE — Telephone Encounter (Signed)
Per Dr. Feliz BeamSnider's request, a letter was sent asking pt to call for MD and Lab appointments.  RN was unable to reach the patient or her sister to find out information.  Left message for [facility] to return call to find out if the patient is continuing to refill her old regimen.

## 2015-09-24 ENCOUNTER — Other Ambulatory Visit: Payer: Self-pay | Admitting: Internal Medicine

## 2015-09-27 ENCOUNTER — Other Ambulatory Visit: Payer: Self-pay | Admitting: *Deleted

## 2015-09-27 MED ORDER — RANITIDINE HCL 150 MG PO TABS: 150.0000 mg | ORAL_TABLET | Freq: Two times a day (BID) | ORAL | Status: DC

## 2015-10-05 ENCOUNTER — Other Ambulatory Visit: Payer: Self-pay | Admitting: Internal Medicine

## 2015-10-05 DIAGNOSIS — B20 Human immunodeficiency virus [HIV] disease: Secondary | ICD-10-CM

## 2015-10-06 ENCOUNTER — Telehealth: Payer: Self-pay | Admitting: *Deleted

## 2015-10-06 NOTE — Telephone Encounter (Addendum)
[name] requesting refills of prezista, norvir, truvada. Per chart review, patient was supposed to stop therapy July 13, 2015, was to follow up in 3 weeks. Patient only got labs 1 week later, multiple unsuccessful attempts to reach patient.  Medications have been filled regularly by [name] - 09/15/15, 5/10, 4/10.  Per [name], they did not receive notice to stop medications or that her psych medications were suspected to be causing extrapyramidal symptoms. No psych med changes per [name], [facility] at [facility]. Meds are delivered to psych office to dispense directly to the patient. [facility] bubble-packs medications, 1 pack/week delivered from psych office to patient. Bactrim/azithromycin were sent to another [facility] - [facility] [address]  Per pharmacist at [facility], patient has been picking up azithromycin, Metformin, pantoprozol, albuterol inhaler, and bactrim by her sister [name]  Was able to get in touch with sister, updated contact information.  Per [name], the patient was told to restart Prezista, Truvada, Norvir at her last office visit and to come back for labs 1 week later.  They were expecting a call to be scheduled for follow up. Patient is still taking weekly azithromycin and daily bactrim.     Please advise if patient should still be on prezista, truvada, norvir; azithromycin, bactrim.  Please advise on labs/office follow up. [name], Jackie Wade M, RN

## 2015-10-08 ENCOUNTER — Telehealth: Payer: Self-pay

## 2015-10-08 NOTE — Telephone Encounter (Signed)
Patient called wanting a prescritption for diabetic shoees, but realized she needed to call [facility]. Patient stated receiving a letter from clinic saying she needed to come in for lab work. Lab appointment made for 7/6 at 9:30a. Jackie Brockandace Emya Picado, LPN

## 2015-10-14 ENCOUNTER — Other Ambulatory Visit: Payer: Medicaid Other

## 2015-10-14 DIAGNOSIS — B20 Human immunodeficiency virus [HIV] disease: Secondary | ICD-10-CM

## 2015-10-14 LAB — CBC WITH DIFFERENTIAL/PLATELET
BASOS PCT: 0 %
Basophils Absolute: 0 cells/uL (ref 0–200)
Eosinophils Absolute: 57 cells/uL (ref 15–500)
Eosinophils Relative: 1 %
HCT: 42.2 % (ref 35.0–45.0)
Hemoglobin: 14.6 g/dL (ref 11.7–15.5)
LYMPHS PCT: 38 %
Lymphs Abs: 2166 cells/uL (ref 850–3900)
MCH: 30.8 pg (ref 27.0–33.0)
MCHC: 34.6 g/dL (ref 32.0–36.0)
MCV: 89 fL (ref 80.0–100.0)
MONOS PCT: 10 %
MPV: 9.3 fL (ref 7.5–12.5)
Monocytes Absolute: 570 cells/uL (ref 200–950)
Neutro Abs: 2907 cells/uL (ref 1500–7800)
Neutrophils Relative %: 51 %
PLATELETS: 247 10*3/uL (ref 140–400)
RBC: 4.74 MIL/uL (ref 3.80–5.10)
RDW: 19.2 % — AB (ref 11.0–15.0)
WBC: 5.7 10*3/uL (ref 3.8–10.8)

## 2015-10-15 LAB — COMPLETE METABOLIC PANEL WITH GFR
ALT: 17 U/L (ref 6–29)
AST: 27 U/L (ref 10–35)
Albumin: 4.1 g/dL (ref 3.6–5.1)
Alkaline Phosphatase: 97 U/L (ref 33–130)
BILIRUBIN TOTAL: 0.4 mg/dL (ref 0.2–1.2)
BUN: 5 mg/dL — ABNORMAL LOW (ref 7–25)
CALCIUM: 9 mg/dL (ref 8.6–10.4)
CO2: 25 mmol/L (ref 20–31)
CREATININE: 1.07 mg/dL — AB (ref 0.50–1.05)
Chloride: 90 mmol/L — ABNORMAL LOW (ref 98–110)
GFR, EST AFRICAN AMERICAN: 67 mL/min (ref >=60)
GFR, Est Non African American: 58 mL/min — ABNORMAL LOW (ref >=60)
Glucose, Bld: 92 mg/dL (ref 65–99)
Potassium: 3.9 mmol/L (ref 3.5–5.3)
Sodium: 128 mmol/L — ABNORMAL LOW (ref 135–146)
TOTAL PROTEIN: 6.9 g/dL (ref 6.1–8.1)

## 2015-10-15 LAB — T-HELPER CELL (CD4) - (RCID CLINIC ONLY)
CD4 T CELL ABS: 790 /uL (ref 400–2700)
CD4 T CELL HELPER: 35 % (ref 33–55)

## 2015-10-17 LAB — HIV-1 RNA QUANT-NO REFLEX-BLD
HIV 1 RNA QUANT: 40 {copies}/mL — AB (ref <20)
HIV-1 RNA QUANT, LOG: 1.6 {Log_copies}/mL — AB (ref <1.30)

## 2015-10-19 ENCOUNTER — Other Ambulatory Visit: Payer: Self-pay | Admitting: Family Medicine

## 2015-10-19 ENCOUNTER — Other Ambulatory Visit: Payer: Self-pay | Admitting: *Deleted

## 2015-10-19 MED ORDER — MOMETASONE FURO-FORMOTEROL FUM 200-5 MCG/ACT IN AERO: INHALATION_SPRAY | RESPIRATORY_TRACT | Status: DC

## 2015-10-19 NOTE — Telephone Encounter (Signed)
Pt needs an inhaler called in.

## 2015-10-27 ENCOUNTER — Telehealth: Payer: Self-pay | Admitting: *Deleted

## 2015-10-27 MED ORDER — CLEVER CHOICE NEBULIZER MISC: 1.0000 "application " | Freq: Four times a day (QID) | Status: DC | PRN

## 2015-10-27 NOTE — Telephone Encounter (Signed)
Pt calling in wanting a new nebulizer machine, old one doesn't work. Please advise. Deseree Bruna PotterBlount, CMA

## 2015-10-27 NOTE — Telephone Encounter (Signed)
I have sent this in to her pharmacy via fax.

## 2015-11-01 ENCOUNTER — Ambulatory Visit: Payer: Medicaid Other | Admitting: Podiatry

## 2015-11-05 ENCOUNTER — Ambulatory Visit: Payer: Medicaid Other | Admitting: Podiatry

## 2015-11-05 ENCOUNTER — Telehealth: Payer: Self-pay | Admitting: Family Medicine

## 2015-11-05 NOTE — Telephone Encounter (Signed)
Pt called and would like an Rx for shoes from [employer]. Pt stated her number has changed and she does not know what the new number is. She will call in next week to see if this was approved. ep

## 2015-11-08 NOTE — Telephone Encounter (Signed)
To clarify further - I cannot write the rx for diabetic shoes until I see her in clinic.  [name], MD

## 2015-11-08 NOTE — Telephone Encounter (Signed)
I am happy to rx diabetic shoes for her, but she needs to come in for an appointment to discuss her diabetes. Per medicare guidelines, must have an office visit to talk about diabetes within 90 days of writing the prescription. It appears the last time we discussed this was in early April.  Please inform patient when she calls back with her updated phone number.  [name], MD

## 2015-11-19 ENCOUNTER — Other Ambulatory Visit: Payer: Self-pay | Admitting: Family Medicine

## 2015-11-19 NOTE — Telephone Encounter (Signed)
Pt is calling because in her nebulizer is no longer working and needs a new one. She also needs all new accessories to go with this such as the mask and tubes. Please call in right away. jw

## 2015-11-20 NOTE — Telephone Encounter (Signed)
Attempted to call in nebulizer machine to patient's pharmacy but was told they do not have neb machines. Where does she want it sent to?  Red team, please call patient Thanks Latrelle DodrillBrittany J McIntyre, MD

## 2015-11-22 NOTE — Telephone Encounter (Signed)
Spoke with pt, she said you can try [facility] on [address]. Deseree Bruna PotterBlount, CMA

## 2015-11-24 ENCOUNTER — Ambulatory Visit: Payer: Medicaid Other | Admitting: Podiatry

## 2015-11-26 NOTE — Telephone Encounter (Signed)
Called [facility] [address] and was told that they do not have nebulizer machines. Apparently you have to go to a speciality supply store. I will write an rx for nebulizer machine and supplies and place it at the front desk for patient to pick up. Please inform patient that she can pick up the rx and then take it to a medical supply store to get the machine.  Thanks, Latrelle DodrillBrittany J Finian Helvey, MD

## 2015-11-29 NOTE — Telephone Encounter (Signed)
Patient informed, has appointment on 8/29 will pick up rx then.

## 2015-12-06 ENCOUNTER — Encounter: Payer: Self-pay | Admitting: Podiatry

## 2015-12-06 ENCOUNTER — Ambulatory Visit (INDEPENDENT_AMBULATORY_CARE_PROVIDER_SITE_OTHER): Payer: Medicaid Other | Admitting: Podiatry

## 2015-12-06 DIAGNOSIS — L84 Corns and callosities: Secondary | ICD-10-CM

## 2015-12-06 DIAGNOSIS — L853 Xerosis cutis: Secondary | ICD-10-CM

## 2015-12-06 DIAGNOSIS — R234 Changes in skin texture: Secondary | ICD-10-CM

## 2015-12-07 ENCOUNTER — Ambulatory Visit: Payer: Self-pay | Admitting: Family Medicine

## 2015-12-14 NOTE — Progress Notes (Signed)
Patient ID: [name], female   DOB: 10/11/1960, 55 y.o.   MRN: [id number]  Subjective: Patient presents for painful, recurrent calluses bottom of her foot. She has pain became with pressure mostly with shoe gear. Denies any redness or drainage. She also has dry heels and she has noticed a crack in the left heel but denies any bleeding, swelling, drainage. No recent injury or trauma. No swelling or redness. No other complaints.  Objective: AAO 3, NAD DP/PT pulses 2/4 , CRT less than 3 seconds  Protective sensation intact with Dorann OuSimms Weinstein monofilament Hyperkeratotic lesion right foot submetatarsal 5 with mild tenderness to palpation. Upon debridement no underlying ulceration, drainage or other signs of infection. No other open lesions or pre-ulcerative lesions. Xerosis to bilateral heels. Skin fissure left plantar heel. No drainage or pus or any surrounding erythema or edema or signs of infection. No other areas of tenderness to bilateral lower extremities. No overlying edema, erythema, increase in warmth No pain with calf compression, swelling, warmth, erythema  Assessment: 55 year old female with right submetatarsal 5 hyperkeratotic lesion, xerosis; skin fissure   Plan: -Treatment options discussed including all alternatives, risks, and complications -Lesions debrided without complication/bleeding. -Recommended moisturizer to the heels daily. If there is any bleeding use antibiotic ointment and to call the office. Fissure was debrided today. -Discussed daily foot inspection -Follow-up as needed. In the meantime, encouraged to call the office with any questions, concerns, change in symptoms.   Ovid CurdMatthew Wagoner, DPM  ,

## 2015-12-21 ENCOUNTER — Encounter: Payer: Self-pay | Admitting: Family Medicine

## 2015-12-21 ENCOUNTER — Ambulatory Visit (INDEPENDENT_AMBULATORY_CARE_PROVIDER_SITE_OTHER): Payer: Medicaid Other | Admitting: Family Medicine

## 2015-12-21 VITALS — BP 99/62 | HR 92 | Temp 98.2°F | Wt 127.2 lb

## 2015-12-21 DIAGNOSIS — Z23 Encounter for immunization: Secondary | ICD-10-CM

## 2015-12-21 DIAGNOSIS — J45909 Unspecified asthma, uncomplicated: Secondary | ICD-10-CM | POA: Diagnosis not present

## 2015-12-21 DIAGNOSIS — E119 Type 2 diabetes mellitus without complications: Secondary | ICD-10-CM | POA: Diagnosis not present

## 2015-12-21 LAB — POCT GLYCOSYLATED HEMOGLOBIN (HGB A1C): Hemoglobin A1C: 5.8

## 2015-12-21 MED ORDER — METFORMIN HCL 500 MG PO TABS: ORAL_TABLET | ORAL | 3 refills | Status: AC

## 2015-12-21 MED ORDER — PANTOPRAZOLE SODIUM 40 MG PO TBEC: 40.0000 mg | DELAYED_RELEASE_TABLET | Freq: Every day | ORAL | 3 refills | Status: DC

## 2015-12-21 NOTE — Assessment & Plan Note (Signed)
A1c 5.8. Well controlled. Continue current regimen.   Cardiac: 10 year ASCVD risk calculated at 5.3% based on lipids from Nov 2016. Will discuss beginning statin when she follows up next visit (did not discuss today) Renal: normal urine micro Nov 2016 Eye: UTD Foot: UTD, encouraged to call podiatry office about coordinating diabetic shoes. Immunizations: UTD

## 2015-12-21 NOTE — Assessment & Plan Note (Signed)
Well controlled. Continue current regimen. Weight stable. Refill protonix, continue zantac.

## 2015-12-21 NOTE — Patient Instructions (Signed)
Flu shot given today  Call ID office to schedule a follow up visit  Nebulizer prescription given  Refilled metformin & protonix  Call podiatrist office about diabetic shoes - I'll sign whatever forms are needed.  Be well, Dr. Pollie MeyerMcIntyre

## 2015-12-21 NOTE — Progress Notes (Signed)
Date of Visit: 12/21/2015   HPI:  Jackie LemmingsDawn presents for follow up. Accompanied by her sister [name] who helps provide some of the history  Diabetes - taking metformin 500mg  twice daily. Tolerating this well. A1c today 5.8. Has followed up with podiatry recently. Would like new diabetic shoes.   COPD - coughs a lot and has difficulty walking due to shortness of breath. Continues to smoke. Needs new rx for nebulizer, as hers is broken. Has not used albuterol much at all recently due to not having neb machine.   GERD - taking protonix & zantac daily. These help. Needs refills.  HIV/hep B - has not seen ID since April. No longer taking prophylactic bactrim, fluconazole, or azithromycin. Remains on truvada, prezista, and norvir.  ROS: See HPI.  PMFSH: history of HIV, schizophrenia, hep B infection, diabetes, GERD  PHYSICAL EXAM: BP 99/62   Pulse 92   Temp 98.2 F (36.8 C) (Oral)   Wt 127 lb 3.2 oz (57.7 kg)   BMI 25.69 kg/m  Gen: NAD, pleasant, cooperative HEENT: normocephalic, atraumatic, moist mucous membranes  Heart: regular rate and rhythm. Murmur present. Lungs: clear to auscultation bilaterally, normal work of breathing , no cough noted Neuro: alert grossly nonfocal, speech normal Psych: calmer than previous, attentive, speech still loud but less so than previously, less pressured Ext: No appreciable lower extremity edema bilaterally Abdomen: soft, nontender to palpation   ASSESSMENT/PLAN:  Health maintenance:  -flu shot given today   HIV (human immunodeficiency virus infection) Encouraged patient & sister to schedule follow up with ID.  Diabetes A1c 5.8. Well controlled. Continue current regimen.   Cardiac: 10 year ASCVD risk calculated at 5.3% based on lipids from Nov 2016. Will discuss beginning statin when she follows up next visit (did not discuss today) Renal: normal urine micro Nov 2016 Eye: UTD Foot: UTD, encouraged to call podiatry office about coordinating  diabetic shoes. Immunizations: UTD   GERD (gastroesophageal reflux disease) Well controlled. Continue current regimen. Weight stable. Refill protonix, continue zantac.  Asthma Likely COPD/asthma combination given prolonged smoking. Poorly controlled by history (gets out of breath with walking in stores, frequent coughing). Given written rx for nebulizer & supplies Continued to encourage smoking cessation, but patient not interested in quitting at this time. Stressed that symptoms will not improve as long as she continues to smoke. She understands this.  FOLLOW UP: Follow up in 3 mos for routine medical problems Schedule with ID  [name], MD [facility]

## 2015-12-21 NOTE — Assessment & Plan Note (Signed)
Likely COPD/asthma combination given prolonged smoking. Poorly controlled by history (gets out of breath with walking in stores, frequent coughing). Given written rx for nebulizer & supplies Continued to encourage smoking cessation, but patient not interested in quitting at this time. Stressed that symptoms will not improve as long as she continues to smoke. She understands this.

## 2015-12-21 NOTE — Assessment & Plan Note (Signed)
Encouraged patient & sister to schedule follow up with ID.

## 2015-12-31 ENCOUNTER — Other Ambulatory Visit: Payer: Self-pay | Admitting: *Deleted

## 2015-12-31 MED ORDER — RANITIDINE HCL 150 MG PO TABS: 150.0000 mg | ORAL_TABLET | Freq: Two times a day (BID) | ORAL | 3 refills | Status: DC

## 2016-01-03 ENCOUNTER — Ambulatory Visit: Payer: Medicaid Other | Admitting: Podiatry

## 2016-01-06 ENCOUNTER — Ambulatory Visit (INDEPENDENT_AMBULATORY_CARE_PROVIDER_SITE_OTHER): Payer: Medicaid Other | Admitting: Podiatry

## 2016-01-06 DIAGNOSIS — E11628 Type 2 diabetes mellitus with other skin complications: Secondary | ICD-10-CM | POA: Diagnosis not present

## 2016-01-06 DIAGNOSIS — L84 Corns and callosities: Secondary | ICD-10-CM

## 2016-01-06 DIAGNOSIS — M79672 Pain in left foot: Secondary | ICD-10-CM

## 2016-01-06 DIAGNOSIS — M79671 Pain in right foot: Secondary | ICD-10-CM

## 2016-01-06 DIAGNOSIS — L851 Acquired keratosis [keratoderma] palmaris et plantaris: Secondary | ICD-10-CM

## 2016-01-08 NOTE — Progress Notes (Signed)
Subjective: Patient with diabetes mellitus presents to the office today for chief complaint of painful callus lesions of the feet. Patient states that the pain is ongoing and is affecting their ability to ambulate without pain. Patient presents today for further treatment and evaluation.  Objective:  Physical Exam General: Alert and oriented x3 in no acute distress  Dermatology: Hyperkeratotic lesion present on the plantar aspect of the fifth MPJ right foot.. Pain on palpation with a central nucleated core noted.  Skin is warm, dry and supple bilateral lower extremities. Negative for open lesions or macerations.  Vascular: Palpable pedal pulses bilaterally. No edema or erythema noted. Capillary refill within normal limits.  Neurological: Epicritic and protective threshold diminished bilaterally.   Musculoskeletal Exam: Pain on palpation at the keratotic lesion noted. Range of motion within normal limits bilateral. Muscle strength 5/5 in all groups bilateral.  Assessment: #1 painful callus lesion fifth MPJ right foot. #2 diabetes mellitus #3 bilateral foot pain.   Plan of Care:  #1 Patient evaluated #2 Excisional debridement of  keratoic lesion using a chisel blade was performed without incident.  #3 Treated area(s) with Salinocaine and dressed with light dressing. #4 today prescription for diabetic shoes with Plastizote inserts was prescribed through [facility].   Patient is to return to the clinic PRN.   Felecia ShellingBrent M. Evans, DPM [facility]

## 2016-01-11 ENCOUNTER — Ambulatory Visit: Payer: Self-pay | Admitting: Family Medicine

## 2016-02-01 ENCOUNTER — Emergency Department (HOSPITAL_COMMUNITY): Payer: Medicaid Other

## 2016-02-01 ENCOUNTER — Emergency Department (HOSPITAL_BASED_OUTPATIENT_CLINIC_OR_DEPARTMENT_OTHER): Admit: 2016-02-01 | Discharge: 2016-02-01 | Disposition: A | Discharge status: Home / Self Care | Payer: Medicaid Other

## 2016-02-01 ENCOUNTER — Emergency Department (HOSPITAL_COMMUNITY)
Admission: EM | Admit: 2016-02-01 | Discharge: 2016-02-01 | Disposition: A | Discharge status: Home / Self Care | Payer: Medicaid Other | Attending: Emergency Medicine | Admitting: Emergency Medicine

## 2016-02-01 ENCOUNTER — Encounter (HOSPITAL_COMMUNITY): Payer: Self-pay | Admitting: Emergency Medicine

## 2016-02-01 DIAGNOSIS — Z7984 Long term (current) use of oral hypoglycemic drugs: Secondary | ICD-10-CM | POA: Insufficient documentation

## 2016-02-01 DIAGNOSIS — M7989 Other specified soft tissue disorders: Secondary | ICD-10-CM

## 2016-02-01 DIAGNOSIS — R0602 Shortness of breath: Secondary | ICD-10-CM | POA: Diagnosis present

## 2016-02-01 DIAGNOSIS — F17210 Nicotine dependence, cigarettes, uncomplicated: Secondary | ICD-10-CM | POA: Insufficient documentation

## 2016-02-01 DIAGNOSIS — J441 Chronic obstructive pulmonary disease with (acute) exacerbation: Secondary | ICD-10-CM | POA: Insufficient documentation

## 2016-02-01 DIAGNOSIS — E119 Type 2 diabetes mellitus without complications: Secondary | ICD-10-CM | POA: Diagnosis not present

## 2016-02-01 LAB — COMPREHENSIVE METABOLIC PANEL
ALBUMIN: 3 g/dL — AB (ref 3.5–5.0)
ALK PHOS: 58 U/L (ref 38–126)
ALT: 18 U/L (ref 14–54)
ANION GAP: 9 (ref 5–15)
AST: 18 U/L (ref 15–41)
BILIRUBIN TOTAL: 0.3 mg/dL (ref 0.3–1.2)
BUN: 8 mg/dL (ref 6–20)
CALCIUM: 9 mg/dL (ref 8.9–10.3)
CO2: 29 mmol/L (ref 22–32)
Chloride: 91 mmol/L — ABNORMAL LOW (ref 101–111)
Creatinine, Ser: 0.88 mg/dL (ref 0.44–1.00)
GLUCOSE: 97 mg/dL (ref 65–99)
POTASSIUM: 4.3 mmol/L (ref 3.5–5.1)
Sodium: 129 mmol/L — ABNORMAL LOW (ref 135–145)
TOTAL PROTEIN: 6.4 g/dL — AB (ref 6.5–8.1)

## 2016-02-01 LAB — CBC WITH DIFFERENTIAL/PLATELET
BASOS PCT: 0 %
Basophils Absolute: 0 10*3/uL (ref 0.0–0.1)
Eosinophils Absolute: 0.1 10*3/uL (ref 0.0–0.7)
Eosinophils Relative: 1 %
HEMATOCRIT: 36 % (ref 36.0–46.0)
HEMOGLOBIN: 12.1 g/dL (ref 12.0–15.0)
LYMPHS ABS: 2.3 10*3/uL (ref 0.7–4.0)
Lymphocytes Relative: 23 %
MCH: 30.1 pg (ref 26.0–34.0)
MCHC: 33.6 g/dL (ref 30.0–36.0)
MCV: 89.6 fL (ref 78.0–100.0)
MONO ABS: 1 10*3/uL (ref 0.1–1.0)
MONOS PCT: 11 %
NEUTROS ABS: 6.3 10*3/uL (ref 1.7–7.7)
NEUTROS PCT: 65 %
Platelets: 184 10*3/uL (ref 150–400)
RBC: 4.02 MIL/uL (ref 3.87–5.11)
RDW: 16.9 % — AB (ref 11.5–15.5)
WBC: 9.7 10*3/uL (ref 4.0–10.5)

## 2016-02-01 LAB — BRAIN NATRIURETIC PEPTIDE: B NATRIURETIC PEPTIDE 5: 110.3 pg/mL — AB (ref 0.0–100.0)

## 2016-02-01 LAB — I-STAT TROPONIN, ED: TROPONIN I, POC: 0 ng/mL (ref 0.00–0.08)

## 2016-02-01 MED ORDER — FUROSEMIDE 10 MG/ML IJ SOLN: 40.0000 mg | Freq: Once | INTRAMUSCULAR | Status: DC

## 2016-02-01 MED ORDER — AZITHROMYCIN 250 MG PO TABS: 250.0000 mg | ORAL_TABLET | Freq: Every day | ORAL | 0 refills | Status: DC

## 2016-02-01 MED ORDER — FUROSEMIDE 20 MG PO TABS
20.0000 mg | ORAL_TABLET | Freq: Once | ORAL | Status: AC
  Administered 2016-02-01: 20 mg via ORAL
  Filled 2016-02-01: qty 1

## 2016-02-01 NOTE — ED Notes (Signed)
Per Huntley DecSara RT, pt refused ABG.

## 2016-02-01 NOTE — ED Notes (Signed)
Pt transported to xray 

## 2016-02-01 NOTE — ED Notes (Signed)
Attempted IV x2 without success. Pt jerked her hand on both opportunities. Pt would not allow RN to attempt anywhere other then her hands.

## 2016-02-01 NOTE — ED Notes (Signed)
Pt requesting transportation home.  Bus pass offered.  Pt sts she does not know how to use the bus system.  Emergency contacts called, left message with sister.  No contacts answered their phone

## 2016-02-01 NOTE — ED Notes (Signed)
Pt provided with ice water.  Okay'd by MD

## 2016-02-01 NOTE — ED Notes (Signed)
Pt placed on 2L [place] for comfort.

## 2016-02-01 NOTE — ED Notes (Signed)
Pt is refusing an IV at this time.

## 2016-02-01 NOTE — ED Provider Notes (Signed)
Patient with cough "gagging" and shortness of breath over the past several months, she reports that her legs have become progressively more swollen over the past 2 days. Treated with oxygen prior to coming here. She's used her home nebulized treatment with partial relief. Exam no respiratory distress speaks in paragraphs lungs clear auscultation heart regular rate abdomen nontender. Extremities with 3+ pretibial pitting edema bilaterally. Chest x-ray viewed by me   Doug SouSam Kamen Hanken, MD 02/01/16 [phone]

## 2016-02-01 NOTE — ED Provider Notes (Signed)
[facility] DEPT Provider Note   CSN: [id number] Arrival date & time: 02/01/16  1606     History   Chief Complaint Chief Complaint  Patient presents with  . Shortness of Breath    HPI [name] is a 55 y.o. female with a hx of COPD, DM, HIV, schizophrenia, that presents with the ED noting gradually worsening exertional SOB and cough over the past few months, not on home oxygen. She denies any significant productivity to her cough, fever, chills, nausea, vomiting, diarrhea, chest pain. She states that over th elast 2 days she has noted significant swelling in her b/l LE, worse on the left after she ate some fish sandwiches. She denies any history of CHF, CAD. Denies any history of prior leg swelling, but she does have a history of significant right lower extremity trauma years prior and has a large well healed scar with tissue defect over her right calf . No hx of prior DVT/PE nor family history of coagulopathy. She states that she has been using her rx'd nebulizers and other medications at home to only slight relief of her sx.   HPI  Past Medical History:  Diagnosis Date  . AIDS (HCC) 12-10-2007  . Cataract   . Chronic asthmatic bronchitis (HCC)   . Diabetes mellitus without complication (HCC)    type 2 with out complication  . GERD (gastroesophageal reflux disease)   . Hepatitis B infection    followed by ID  . Herpes simplex   . History of cholecystectomy   . Schizophrenia (HCC)   . Seizures (HCC)   . Tobacco use   . Tuberculin test reaction 11-2008 [facility]     Patient Active Problem List   Diagnosis Date Noted  . Xerosis of skin 08/24/2015  . GERD (gastroesophageal reflux disease) 08/19/2015  . Abnormal echocardiogram 07/24/2015  . Murmur, cardiac 06/21/2015  . Chronic hypercapnic respiratory failure (HCC)   . Diabetes (HCC) 12/01/2014  . Paranoid schizophrenia (HCC)   . Hyponatremia 10/22/2014  . Microscopic hematuria 01/29/2014  . Abdominal pain 12/12/2013  .  Tobacco use disorder 02/18/2013  . Hepatitis B infection   . Pre-ulcerative corn or callous 05/07/2012  . Urinary incontinence, nocturnal enuresis 05/07/2012  . Fecal incontinence 05/07/2012  . Systolic murmur 05/07/2012  . Routine health maintenance 05/07/2012  . Asthma 08/18/2010  . HIV (human immunodeficiency virus infection) (HCC) 08/18/2010  . Schizophrenia (HCC) 08/18/2010  . Herpes simplex infection 08/18/2010    Past Surgical History:  Procedure Laterality Date  . CHOLECYSTECTOMY    . LEG SURGERY Right    s/p accident; "steel fell on the back of my leg"  . TONSILLECTOMY      OB History    No data available       Home Medications    Prior to Admission medications   Medication Sig Start Date End Date Taking? Authorizing Provider  albuterol (PROVENTIL) (2.5 MG/3ML) 0.083% nebulizer solution INHALE CONTENTS OF 1 VIAL USING NEBULIZER EVERY 6 HOURS AS NEEDED FOR WHEEZING 06/22/15   [name], MD  azithromycin (ZITHROMAX Z-PAK) 250 MG tablet Take 1 tablet (250 mg total) by mouth daily. Please follow package instructions with 500mg  on day 1 and then 250 mg on days 2-5 02/01/16   Francoise Ceo, DO  divalproex (DEPAKOTE SPRINKLE) 125 MG capsule TAKE 4 CAPSULES (500 MG TOTAL) BY MOUTH 2 (TWO) TIMES DAILY. 06/08/15   [name], MD  fluconazole (DIFLUCAN) 200 MG tablet Take 1 tablet (200  mg total) by mouth daily. Patient not taking: Reported on 12/21/2015 07/13/15   Judyann Munson, MD  metFORMIN (GLUCOPHAGE) 500 MG tablet TAKE 1 TABLET BY MOUTH 2 TIMES DAILY WITH A MEAL 12/21/15   [name], MD  mometasone-formoterol [facility]) 200-5 MCG/ACT AERO INHALE 2 PUFFS INTO THE LUNGS TWICE DAILY 10/19/15   Uvaldo Rising, MD  Nebulizers (CLEVER CHOICE NEBULIZER) MISC 1 application by Does not apply route every 6 (six) hours as needed. Please provide 1 nebulizer machine to use via inhalation Q6 hours prn SOB 10/27/15   Tobey Grim, MD  NORVIR 100 MG TABS tablet TAKE  1 TABLET BY MOUTH ONCE DAILY WITH PREZISTA 10/07/15   Judyann Munson, MD  pantoprazole (PROTONIX) 40 MG tablet Take 1 tablet (40 mg total) by mouth daily. 12/21/15   [name], MD  PREZISTA 800 MG tablet TAKE 1 TABLET BY MOUTH ONCE DAILY WITH BREAKFAST.  TAKE WITH NORVIR. 10/07/15   Judyann Munson, MD  ranitidine (ZANTAC) 150 MG tablet Take 1 tablet (150 mg total) by mouth 2 (two) times daily. 12/31/15   [name], MD  risperiDONE (RISPERDAL M-TABS) 3 MG disintegrating tablet Take 1 tablet (3 mg total) by mouth 2 (two) times daily. 11/23/14   Shari Prows, MD  risperiDONE microspheres (RISPERDAL CONSTA) 50 MG injection Inject 50 mg into the muscle every 30 (thirty) days.     Historical Provider, MD  sulfamethoxazole-trimethoprim (BACTRIM DS,SEPTRA DS) 800-160 MG tablet TAKE 1 TABLET BY MOUTH DAILY. Patient not taking: Reported on 12/21/2015 08/03/15   [name], MD  TRUVADA 200-300 MG tablet TAKE 1 TABLET BY MOUTH DAILY. 10/07/15   Judyann Munson, MD    Family History Family History  Problem Relation Age of Onset  . Diabetes Sister   . Cancer Sister     breast  . Diabetes Brother   . Hypertension Brother   . Cancer Mother     lung  . Hypertension Mother   . Heart disease Father     Social History Social History  Substance Use Topics  . Smoking status: Current Every Day Smoker    Packs/day: 1.50    Years: 35.00    Types: Cigarettes    Start date: 04/10/1974  . Smokeless tobacco: Never Used     Comment: Previously smoked 2ppd, might start patch  . Alcohol use 0.6 oz/week    1 Standard drinks or equivalent per week     Comment: beer occas     Allergies   Review of patient's allergies indicates no known allergies.   Review of Systems Review of Systems  Constitutional: Negative for chills, diaphoresis and fever.  HENT: Negative for congestion, rhinorrhea, sinus pressure, sneezing and sore throat.   Respiratory: Positive for cough and shortness  of breath. Negative for chest tightness and wheezing.   Cardiovascular: Positive for leg swelling. Negative for chest pain and palpitations.  Gastrointestinal: Negative for abdominal pain, diarrhea, nausea and vomiting.  Genitourinary: Negative for difficulty urinating, dysuria, frequency and urgency.  Musculoskeletal: Negative for arthralgias and gait problem.  Skin: Negative for rash and wound.  Neurological: Negative for syncope, weakness, light-headedness, numbness and headaches.  All other systems reviewed and are negative.    Physical Exam Updated Vital Signs BP 110/86   Pulse 84   Temp 99.7 F (37.6 C) (Oral)   Resp 17   Ht 4\' 11"  (1.499 m)   Wt 57.6 kg   SpO2 96%   BMI 25.65 kg/m  Physical Exam  Constitutional: She is oriented to person, place, and time. She appears well-developed and well-nourished. No distress.  HENT:  Head: Normocephalic and atraumatic.  Nose: Nose normal.  Mouth/Throat: Oropharynx is clear and moist.  Eyes: Conjunctivae and EOM are normal. Pupils are equal, round, and reactive to light.  Neck: Neck supple.  Cardiovascular: Normal rate, regular rhythm, normal heart sounds and intact distal pulses.   Pulmonary/Chest: Effort normal. No tachypnea. No respiratory distress. She has rales in the right lower field and the left lower field. She exhibits no tenderness.  Abdominal: Soft. She exhibits no distension. There is no tenderness.  Musculoskeletal: She exhibits edema (2+ in RLE, 3+ pitting in LLE). She exhibits no tenderness.  Large well healed scar to the right calf  Neurological: She is alert and oriented to person, place, and time. She has normal strength. No cranial nerve deficit or sensory deficit. Coordination and gait normal. GCS eye subscore is 4. GCS verbal subscore is 5. GCS motor subscore is 6.  Skin: Skin is warm and dry. She is not diaphoretic.  Nursing note and vitals reviewed.    ED Treatments / Results  Labs (all labs ordered are  listed, but only abnormal results are displayed) Labs Reviewed  CBC WITH DIFFERENTIAL/PLATELET - Abnormal; Notable for the following:       Result Value   RDW 16.9 (*)    All other components within normal limits  COMPREHENSIVE METABOLIC PANEL - Abnormal; Notable for the following:    Sodium 129 (*)    Chloride 91 (*)    Total Protein 6.4 (*)    Albumin 3.0 (*)    All other components within normal limits  BRAIN NATRIURETIC PEPTIDE - Abnormal; Notable for the following:    B Natriuretic Peptide 110.3 (*)    All other components within normal limits  I-STAT TROPOININ, ED    EKG  EKG Interpretation  Date/Time:  Tuesday February 01 2016 16:24:09 EDT Ventricular Rate:  96 PR Interval:    QRS Duration: 62 QT Interval:  329 QTC Calculation: 416 R Axis:   88 Text Interpretation:  Sinus rhythm Minimal ST elevation, inferior leads SINCE LAST TRACING HEART RATE HAS INCREASED Confirmed by Ethelda Chick  MD, SAM [phone]) on 02/01/2016 4:30:44 PM       Radiology Dg Chest 2 View  Result Date: 02/01/2016 CLINICAL DATA:  Shortness of breath.  Recent bronchitis EXAM: CHEST  2 VIEW COMPARISON:  06/06/2015 FINDINGS: Chronic airway thickening and hyperinflation. There is mild reticular nodular density at the bases which could reflect bronchopneumonia. Small hiatal hernia and Bochdalek's hernia on the right. No edema, effusion, or pneumothorax. Normal heart size. IMPRESSION: Chronic bronchitic changes. Reticular nodular densities at the bases are increased from prior and there could superimposed bronchopneumonia. Electronically Signed   By: Marnee Spring M.D.   On: 02/01/2016 17:02    Procedures Procedures (including critical care time)  Medications Ordered in ED Medications  furosemide (LASIX) tablet 20 mg (20 mg Oral Given 02/01/16 2025)     Initial Impression / Assessment and Plan / ED Course  I have reviewed the triage vital signs and the nursing notes.  Pertinent labs & imaging  results that were available during my care of the patient were reviewed by me and considered in my medical decision making (see chart for details).  Clinical Course  55 y.o. with hx of COPD presents with slight acute on chronic SOB, and cough. O2 dropped down to around 90 when ambulating,  placed on 2L of O2 in bed which brought her up to mid 90's. No wheezing or resp distress noted at rest, but became tachypneic on ambulation to the bathroom.   LE swelling concerning for CHF given history of salty food intake prior to onset. BNP 110.3, trop negative. CMP shows hyponatremia at 129, which appears chronic compared to previous measures, otherwise with no significant abnormalities. CBC reassuring with no leukocytosis.  CXR shows basilar densities suggestive of bronchopneumonia in the setting of COPD.   EKG shows minimal ST change in inferior leads but no chest pain, no reciprocal changes.  LE US was done and showeKoread no evidence of DVT.   She was given a PO dose of lasix for her LE swelling.   Given her new oxygen demand, and new LE swelling, she was offered admission, but she declined, requested discharge. She was recommended to follow up closely with her PCP in the next few days to monitor for improvement in her sx, and she stated both understanding and agreement with this plan. She was rx'd a course of azithro for COPD exacerbation.    Final Clinical Impressions(s) / ED Diagnoses   Final diagnoses:  COPD exacerbation (HCC)  Swelling of lower extremity    New Prescriptions Discharge Medication List as of 02/01/2016  8:29 PM       Francoise CeoWarren S Joie Hipps, DO 02/02/16 1043    Doug SouSam Jacubowitz, MD 02/03/16 2109

## 2016-02-01 NOTE — ED Notes (Signed)
MD at bedside. 

## 2016-02-01 NOTE — ED Notes (Signed)
Pt provided with [place], okay'd by MD

## 2016-02-01 NOTE — Progress Notes (Signed)
**  Preliminary report by tech**  Bilateral lower extremity venous duplex completed. There is no evidence of deep or superficial vein thrombosis involving the right and left lower extremities. All visualized vessels appear patent and compressible. There is no evidence of Baker's cysts bilaterally. Results were given to Dr. Marzetta MerinoPelkey.  02/01/16 6:34 PM [name] RVT

## 2016-02-01 NOTE — ED Notes (Addendum)
During IV Team RN attempt for IV pt snapped at both RNs stating "what are you guys trying to do to me?".  IV Team RN attempted x 2 without success.  This RN made patient aware of the need for an IV for medications and admission.  Pt agreed to allow this RN to attempt.  This RN attempted, and during attempt pt pulled away and yelled at RN stating "all you guys are trying to do is hurt me!!!".  This RN stopped attempt and bandaged pt.  Pt refusing any additional sticks at this time.

## 2016-02-01 NOTE — ED Triage Notes (Signed)
Per EMS:  Pt presents to ED for bilateral ankle swelling starting 2 days ago.  Pt has a hx of COPD and is a 1.5 pack/day smoker.  Pt SOB at rest, 94% on RA, placed on 2L [place] by EMS.  EMS sts lungs are clear.  Pt is currently being treated for bronchitis.

## 2016-02-22 ENCOUNTER — Ambulatory Visit: Payer: Self-pay | Admitting: Internal Medicine

## 2016-02-27 ENCOUNTER — Encounter (HOSPITAL_COMMUNITY): Payer: Self-pay

## 2016-02-27 ENCOUNTER — Emergency Department (HOSPITAL_COMMUNITY): Payer: Medicaid Other

## 2016-02-27 ENCOUNTER — Observation Stay (HOSPITAL_COMMUNITY)
Admission: EM | Admit: 2016-02-27 | Discharge: 2016-02-28 | Disposition: A | Discharge status: Home / Self Care | Payer: Medicaid Other | Attending: Family Medicine | Admitting: Family Medicine

## 2016-02-27 DIAGNOSIS — Z79899 Other long term (current) drug therapy: Secondary | ICD-10-CM | POA: Insufficient documentation

## 2016-02-27 DIAGNOSIS — E119 Type 2 diabetes mellitus without complications: Secondary | ICD-10-CM | POA: Diagnosis not present

## 2016-02-27 DIAGNOSIS — K219 Gastro-esophageal reflux disease without esophagitis: Secondary | ICD-10-CM | POA: Insufficient documentation

## 2016-02-27 DIAGNOSIS — Z7951 Long term (current) use of inhaled steroids: Secondary | ICD-10-CM | POA: Insufficient documentation

## 2016-02-27 DIAGNOSIS — F17210 Nicotine dependence, cigarettes, uncomplicated: Secondary | ICD-10-CM | POA: Diagnosis not present

## 2016-02-27 DIAGNOSIS — J449 Chronic obstructive pulmonary disease, unspecified: Principal | ICD-10-CM | POA: Insufficient documentation

## 2016-02-27 DIAGNOSIS — B20 Human immunodeficiency virus [HIV] disease: Secondary | ICD-10-CM | POA: Insufficient documentation

## 2016-02-27 DIAGNOSIS — F200 Paranoid schizophrenia: Secondary | ICD-10-CM | POA: Insufficient documentation

## 2016-02-27 DIAGNOSIS — J4541 Moderate persistent asthma with (acute) exacerbation: Secondary | ICD-10-CM

## 2016-02-27 DIAGNOSIS — J9612 Chronic respiratory failure with hypercapnia: Secondary | ICD-10-CM | POA: Diagnosis not present

## 2016-02-27 DIAGNOSIS — Z7984 Long term (current) use of oral hypoglycemic drugs: Secondary | ICD-10-CM | POA: Insufficient documentation

## 2016-02-27 DIAGNOSIS — R569 Unspecified convulsions: Secondary | ICD-10-CM | POA: Diagnosis not present

## 2016-02-27 DIAGNOSIS — J45909 Unspecified asthma, uncomplicated: Secondary | ICD-10-CM | POA: Diagnosis present

## 2016-02-27 DIAGNOSIS — R0602 Shortness of breath: Secondary | ICD-10-CM | POA: Diagnosis present

## 2016-02-27 LAB — CBC WITH DIFFERENTIAL/PLATELET
BASOS ABS: 0 10*3/uL (ref 0.0–0.1)
BASOS PCT: 0 %
EOS ABS: 0 10*3/uL (ref 0.0–0.7)
EOS PCT: 0 %
HCT: 38.4 % (ref 36.0–46.0)
Hemoglobin: 13 g/dL (ref 12.0–15.0)
Lymphocytes Relative: 19 %
Lymphs Abs: 2.3 10*3/uL (ref 0.7–4.0)
MCH: 29 pg (ref 26.0–34.0)
MCHC: 33.9 g/dL (ref 30.0–36.0)
MCV: 85.5 fL (ref 78.0–100.0)
MONO ABS: 0.9 10*3/uL (ref 0.1–1.0)
MONOS PCT: 7 %
Neutro Abs: 9.1 10*3/uL — ABNORMAL HIGH (ref 1.7–7.7)
Neutrophils Relative %: 74 %
PLATELETS: 430 10*3/uL — AB (ref 150–400)
RBC: 4.49 MIL/uL (ref 3.87–5.11)
RDW: 17.5 % — AB (ref 11.5–15.5)
WBC: 12.2 10*3/uL — ABNORMAL HIGH (ref 4.0–10.5)

## 2016-02-27 LAB — BASIC METABOLIC PANEL
ANION GAP: 10 (ref 5–15)
BUN: 8 mg/dL (ref 6–20)
CALCIUM: 9 mg/dL (ref 8.9–10.3)
CO2: 26 mmol/L (ref 22–32)
CREATININE: 0.99 mg/dL (ref 0.44–1.00)
Chloride: 98 mmol/L — ABNORMAL LOW (ref 101–111)
Glucose, Bld: 124 mg/dL — ABNORMAL HIGH (ref 65–99)
Potassium: 3.8 mmol/L (ref 3.5–5.1)
Sodium: 134 mmol/L — ABNORMAL LOW (ref 135–145)

## 2016-02-27 LAB — BRAIN NATRIURETIC PEPTIDE: B NATRIURETIC PEPTIDE 5: 192.1 pg/mL — AB (ref 0.0–100.0)

## 2016-02-27 LAB — TROPONIN I: Troponin I: <0.03 ng/mL (ref <0.03)

## 2016-02-27 MED ORDER — ALBUTEROL (5 MG/ML) CONTINUOUS INHALATION SOLN
10.0000 mg/h | INHALATION_SOLUTION | Freq: Once | RESPIRATORY_TRACT | Status: AC
  Administered 2016-02-27: 10 mg/h via RESPIRATORY_TRACT
  Filled 2016-02-27: qty 20

## 2016-02-27 MED ORDER — METHYLPREDNISOLONE SODIUM SUCC 125 MG IJ SOLR: 125.0000 mg | Freq: Once | INTRAMUSCULAR | Status: DC

## 2016-02-27 NOTE — ED Triage Notes (Signed)
Per EMS: Pt complaining of cough and SOB x 2 days. Pt also complaining of fever and chills. EMS gave 10mg  albuterol and 0.5 atrovent. Pt a/o x 4, coughing upon arrival.

## 2016-02-27 NOTE — ED Provider Notes (Addendum)
[facility] Provider Note   CSN: [id number] Arrival date & time: 02/27/16  2215     History   Chief Complaint Chief Complaint  Patient presents with  . Cough  . Shortness of Breath    HPI [name] is a 55 y.o. female.  Patient with history of AIDS, chronic asthma, diabetes, reflux, schizophrenia, tobacco abuse presents with worsening shortness of breath and cough for the past 2 days. Patient has had multiple similar episodes the past. Patient denies any blood clot history no recent surgery. Patient had ultrasound for lower extremities in the recent past. Patient denies any cardiac history. No chest pain. Patient had low-grade fever recently. Patient has been compliant with her HIV medications.      Past Medical History:  Diagnosis Date  . AIDS (HCC) 12-10-2007  . Cataract   . Chronic asthmatic bronchitis (HCC)   . Diabetes mellitus without complication (HCC)    type 2 with out complication  . GERD (gastroesophageal reflux disease)   . Hepatitis B infection    followed by ID  . Herpes simplex   . History of cholecystectomy   . Schizophrenia (HCC)   . Seizures (HCC)   . Tobacco use   . Tuberculin test reaction 11-2008 [facility]     Patient Active Problem List   Diagnosis Date Noted  . Xerosis of skin 08/24/2015  . GERD (gastroesophageal reflux disease) 08/19/2015  . Abnormal echocardiogram 07/24/2015  . Murmur, cardiac 06/21/2015  . Chronic hypercapnic respiratory failure (HCC)   . Diabetes (HCC) 12/01/2014  . Paranoid schizophrenia (HCC)   . Hyponatremia 10/22/2014  . Microscopic hematuria 01/29/2014  . Abdominal pain 12/12/2013  . Tobacco use disorder 02/18/2013  . Hepatitis B infection   . Pre-ulcerative corn or callous 05/07/2012  . Urinary incontinence, nocturnal enuresis 05/07/2012  . Fecal incontinence 05/07/2012  . Systolic murmur 05/07/2012  . Routine health maintenance 05/07/2012  . Asthma 08/18/2010  . HIV (human immunodeficiency virus  infection) (HCC) 08/18/2010  . Schizophrenia (HCC) 08/18/2010  . Herpes simplex infection 08/18/2010    Past Surgical History:  Procedure Laterality Date  . CHOLECYSTECTOMY    . LEG SURGERY Right    s/p accident; "steel fell on the back of my leg"  . TONSILLECTOMY      OB History    No data available       Home Medications    Prior to Admission medications   Medication Sig Start Date End Date Taking? Authorizing Provider  albuterol (PROVENTIL) (2.5 MG/3ML) 0.083% nebulizer solution INHALE CONTENTS OF 1 VIAL USING NEBULIZER EVERY 6 HOURS AS NEEDED FOR WHEEZING 06/22/15   Latrelle DodrillBrittany J McIntyre, MD  azithromycin (ZITHROMAX Z-PAK) 250 MG tablet Take 1 tablet (250 mg total) by mouth daily. Please follow package instructions with 500mg  on day 1 and then 250 mg on days 2-5 02/01/16   Francoise CeoWarren S Jones, DO  divalproex (DEPAKOTE SPRINKLE) 125 MG capsule TAKE 4 CAPSULES (500 MG TOTAL) BY MOUTH 2 (TWO) TIMES DAILY. 06/08/15   Latrelle DodrillBrittany J McIntyre, MD  fluconazole (DIFLUCAN) 200 MG tablet Take 1 tablet (200 mg total) by mouth daily. Patient not taking: Reported on 12/21/2015 07/13/15   Judyann Munsonynthia Snider, MD  metFORMIN (GLUCOPHAGE) 500 MG tablet TAKE 1 TABLET BY MOUTH 2 TIMES DAILY WITH A MEAL 12/21/15   Latrelle DodrillBrittany J McIntyre, MD  mometasone-formoterol [facility](DULERA) 200-5 MCG/ACT AERO INHALE 2 PUFFS INTO THE LUNGS TWICE DAILY 10/19/15   Uvaldo RisingKyle J Fletke, MD  Nebulizers (CLEVER CHOICE NEBULIZER) MISC  1 application by Does not apply route every 6 (six) hours as needed. Please provide 1 nebulizer machine to use via inhalation Q6 hours prn SOB 10/27/15   Tobey Grim, MD  NORVIR 100 MG TABS tablet TAKE 1 TABLET BY MOUTH ONCE DAILY WITH PREZISTA 10/07/15   Judyann Munson, MD  pantoprazole (PROTONIX) 40 MG tablet Take 1 tablet (40 mg total) by mouth daily. 12/21/15   [name], MD  PREZISTA 800 MG tablet TAKE 1 TABLET BY MOUTH ONCE DAILY WITH BREAKFAST.  TAKE WITH NORVIR. 10/07/15   Judyann Munson, MD  ranitidine  (ZANTAC) 150 MG tablet Take 1 tablet (150 mg total) by mouth 2 (two) times daily. 12/31/15   [name], MD  risperiDONE (RISPERDAL M-TABS) 3 MG disintegrating tablet Take 1 tablet (3 mg total) by mouth 2 (two) times daily. 11/23/14   Shari Prows, MD  risperiDONE microspheres (RISPERDAL CONSTA) 50 MG injection Inject 50 mg into the muscle every 30 (thirty) days.     Historical Provider, MD  sulfamethoxazole-trimethoprim (BACTRIM DS,SEPTRA DS) 800-160 MG tablet TAKE 1 TABLET BY MOUTH DAILY. Patient not taking: Reported on 12/21/2015 08/03/15   [name], MD  TRUVADA 200-300 MG tablet TAKE 1 TABLET BY MOUTH DAILY. 10/07/15   Judyann Munson, MD    Family History Family History  Problem Relation Age of Onset  . Diabetes Sister   . Cancer Sister     breast  . Diabetes Brother   . Hypertension Brother   . Cancer Mother     lung  . Hypertension Mother   . Heart disease Father     Social History Social History  Substance Use Topics  . Smoking status: Current Every Day Smoker    Packs/day: 1.50    Years: 35.00    Types: Cigarettes    Start date: 04/10/1974  . Smokeless tobacco: Never Used     Comment: Previously smoked 2ppd, might start patch  . Alcohol use 0.6 oz/week    1 Standard drinks or equivalent per week     Comment: beer occas     Allergies   Patient has no known allergies.   Review of Systems Review of Systems  Constitutional: Positive for fever. Negative for chills.  HENT: Positive for congestion. Negative for ear pain and sore throat.   Eyes: Negative for pain and visual disturbance.  Respiratory: Positive for cough, shortness of breath and wheezing.   Cardiovascular: Negative for chest pain and palpitations.  Gastrointestinal: Negative for abdominal pain and vomiting.  Genitourinary: Negative for dysuria and hematuria.  Musculoskeletal: Negative for arthralgias and back pain.  Skin: Negative for color change and rash.  Neurological:  Negative for seizures and syncope.  All other systems reviewed and are negative.    Physical Exam Updated Vital Signs BP 100/71   Pulse 88   Temp 99.8 F (37.7 C) (Oral)   Resp 23   SpO2 100%   Physical Exam  Constitutional: She appears well-developed and well-nourished. No distress.  HENT:  Head: Normocephalic and atraumatic.  Eyes: Conjunctivae are normal.  Neck: Neck supple.  Cardiovascular: Normal rate and regular rhythm.   No murmur heard. Pulmonary/Chest: Effort normal. No respiratory distress. She has wheezes. She has rales.  Abdominal: Soft. There is no tenderness.  Musculoskeletal: She exhibits no edema.  Neurological: She is alert.  Skin: Skin is warm and dry.  Psychiatric: She has a normal mood and affect.  Nursing note and vitals reviewed.  ED Treatments / Results  Labs (all labs ordered are listed, but only abnormal results are displayed) Labs Reviewed  CBC WITH DIFFERENTIAL/PLATELET - Abnormal; Notable for the following:       Result Value   WBC 12.2 (*)    RDW 17.5 (*)    Platelets 430 (*)    Neutro Abs 9.1 (*)    All other components within normal limits  BASIC METABOLIC PANEL - Abnormal; Notable for the following:    Sodium 134 (*)    Chloride 98 (*)    Glucose, Bld 124 (*)    All other components within normal limits  BRAIN NATRIURETIC PEPTIDE - Abnormal; Notable for the following:    B Natriuretic Peptide 192.1 (*)    All other components within normal limits  TROPONIN I    EKG  EKG Interpretation None       Radiology Dg Chest 2 View  Result Date: 02/27/2016 CLINICAL DATA:  Acute onset of shortness of breath. Initial encounter. EXAM: CHEST  2 VIEW COMPARISON:  Chest radiograph performed 02/01/2016 FINDINGS: The lungs are well-aerated. Vascular congestion is noted. Increased interstitial markings may reflect mild interstitial edema. There is no evidence of pleural effusion or pneumothorax. The heart is mildly enlarged. No acute  osseous abnormalities are seen. IMPRESSION: Vascular congestion and mild cardiomegaly. Increased interstitial markings may reflect mild interstitial edema. Electronically Signed   By: Roanna RaiderJeffery  Chang M.D.   On: 02/27/2016 23:03    Procedures Procedures (including critical care time)  Medications Ordered in ED Medications  methylPREDNISolone sodium succinate (SOLU-MEDROL) 125 mg/2 mL injection 125 mg (not administered)  albuterol (PROVENTIL,VENTOLIN) solution continuous neb (10 mg/hr Nebulization Given 02/27/16 2321)     Initial Impression / Assessment and Plan / ED Course  I have reviewed the triage vital signs and the nursing notes.  Pertinent labs & imaging results that were available during my care of the patient were reviewed by me and considered in my medical decision making (see chart for details).  Clinical Course    Patient presents with worsening cough and shortness of breath. Patient has clinical components of both bronchitis/asthma.  Breathing treatment steroids given. Chest x-ray showed vascular congestion. Plan for observation page family practice resident.  Patient proved on reassessment. Family practice physicians assessed the patient in the ER and are comfortable following the patient in the clinic.   Final Clinical Impressions(s) / ED Diagnoses   Final diagnoses:  Asthmatic bronchitis with exacerbation, moderate persistent    New Prescriptions New Prescriptions   No medications on file     Blane OharaJoshua Anokhi Shannon, MD 02/28/16 0002    Blane OharaJoshua Adrianne Shackleton, MD 02/28/16 [id number]

## 2016-02-27 NOTE — ED Notes (Signed)
Pt asked x3 for rectal temp. Pt refuses and states "nothing is going up there"

## 2016-02-28 MED ORDER — PREDNISONE 20 MG PO TABS: ORAL_TABLET | ORAL | 0 refills | Status: DC

## 2016-02-28 MED ORDER — PREDNISONE 20 MG PO TABS
60.0000 mg | ORAL_TABLET | Freq: Once | ORAL | Status: AC
  Administered 2016-02-28: 60 mg via ORAL
  Filled 2016-02-28: qty 3

## 2016-02-28 NOTE — H&P (Signed)
[facility] Note Service Pager: [phone]  Patient name: [name] Medical record number: [id number] Date of birth: 02/28/1961 Age: 55 y.o. Gender: female  Primary Care Provider: Levert FeinsteinBrittany McIntyre, MD Consultants: None  Chief Complaint: cough  Assessment and Plan: [name] is a 55 y.o. female presenting with cough. PMH is significant for COPD, DM, HIV, Paranoid schizophrenia, hepatitis B, tobacco abuse.   Cough. Patient with cough and subjective fevers for 2 days. Currently satting 100% on room air and well appearing after receiving breathing treatment in the ED and via EMS. Only mild rales on lung exam - no wheezes appreciated. Patient wishes to go home with close clinic follow up. Patient's CXR read as possible interstitial edema, though patient without other signs of volume overload. Symptoms likely secondary to viral process given sick contacts with similar symptoms. No fever to suggest bacterial pneumonia. Patient will receive prednisone in ED to treat for possible COPD exacerbation. Strict return precautions reviewed with patient.  - Discharge home with close clinic follow up - [facility]   Disposition: home  History of Present Illness:  [name] is a 55 y.o. female presenting with cough. Associated symptoms include shortness of breath and subjective fever at home. She has had several family members at home with similar symptoms. No chest pain. Cough is nonproductive. Patient called EMS from home who brought her to the ED. En route to the ED, patient received albuterol and atrovent. In the ED, the patient received an albuterol nebulizer. At the time of this interview, patient said that she was feeling much better and would like to go home. Stated that [facility] this [facility] to schedule an appointment.    Review Of Systems: Per HP  ROS  Patient  Active Problem List   Diagnosis Date Noted  . Asthmatic bronchitis 02/27/2016  . Xerosis of skin 08/24/2015  . GERD (gastroesophageal reflux disease) 08/19/2015  . Abnormal echocardiogram 07/24/2015  . Murmur, cardiac 06/21/2015  . Chronic hypercapnic respiratory failure (HCC)   . Diabetes (HCC) 12/01/2014  . Paranoid schizophrenia (HCC)   . Hyponatremia 10/22/2014  . Microscopic hematuria 01/29/2014  . Abdominal pain 12/12/2013  . Tobacco use disorder 02/18/2013  . Hepatitis B infection   . Pre-ulcerative corn or callous 05/07/2012  . Urinary incontinence, nocturnal enuresis 05/07/2012  . Fecal incontinence 05/07/2012  . Systolic murmur 05/07/2012  . Routine health maintenance 05/07/2012  . Asthma 08/18/2010  . HIV (human immunodeficiency virus infection) (HCC) 08/18/2010  . Schizophrenia (HCC) 08/18/2010  . Herpes simplex infection 08/18/2010    Past Medical History: Past Medical History:  Diagnosis Date  . AIDS (HCC) 12-10-2007  . Cataract   . Chronic asthmatic bronchitis (HCC)   . Diabetes mellitus without complication (HCC)    type 2 with out complication  . GERD (gastroesophageal reflux disease)   . Hepatitis B infection    followed by ID  . Herpes simplex   . History of cholecystectomy   . Schizophrenia (HCC)   . Seizures (HCC)   . Tobacco use   . Tuberculin test reaction 11-2008 [facility]     Past Surgical History: Past Surgical History:  Procedure Laterality Date  . CHOLECYSTECTOMY    . LEG SURGERY Right    s/p accident; "steel fell on the back of my leg"  . TONSILLECTOMY      Social History: Social History  Substance  Use Topics  . Smoking status: Current Every Day Smoker    Packs/day: 1.50    Years: 35.00    Types: Cigarettes    Start date: 04/10/1974  . Smokeless tobacco: Never Used     Comment: Previously smoked 2ppd, might start patch  . Alcohol use 0.6 oz/week    1 Standard drinks or equivalent per week     Comment: beer occas   Additional  social history: None  Please also refer to relevant sections of EMR.  Family History: Family History  Problem Relation Age of Onset  . Diabetes Sister   . Cancer Sister     breast  . Diabetes Brother   . Hypertension Brother   . Cancer Mother     lung  . Hypertension Mother   . Heart disease Father    Allergies and Medications: No Known Allergies No current facility-administered medications on file prior to encounter.    Current Outpatient Prescriptions on File Prior to Encounter  Medication Sig Dispense Refill  . albuterol (PROVENTIL) (2.5 MG/3ML) 0.083% nebulizer solution INHALE CONTENTS OF 1 VIAL USING NEBULIZER EVERY 6 HOURS AS NEEDED FOR WHEEZING 75 mL 5  . azithromycin (ZITHROMAX Z-PAK) 250 MG tablet Take 1 tablet (250 mg total) by mouth daily. Please follow package instructions with 500mg  on day 1 and then 250 mg on days 2-5 6 tablet 0  . divalproex (DEPAKOTE SPRINKLE) 125 MG capsule TAKE 4 CAPSULES (500 MG TOTAL) BY MOUTH 2 (TWO) TIMES DAILY. 240 capsule 0  . fluconazole (DIFLUCAN) 200 MG tablet Take 1 tablet (200 mg total) by mouth daily. (Patient not taking: Reported on 12/21/2015) 7 tablet 0  . metFORMIN (GLUCOPHAGE) 500 MG tablet TAKE 1 TABLET BY MOUTH 2 TIMES DAILY WITH A MEAL 180 tablet 3  . mometasone-formoterol (DULERA) 200-5 MCG/ACT AERO INHALE 2 PUFFS INTO THE LUNGS TWICE DAILY 13 g 1  . Nebulizers (CLEVER CHOICE NEBULIZER) MISC 1 application by Does not apply route every 6 (six) hours as needed. Please provide 1 nebulizer machine to use via inhalation Q6 hours prn SOB 1 each 0  . NORVIR 100 MG TABS tablet TAKE 1 TABLET BY MOUTH ONCE DAILY WITH PREZISTA 30 tablet 5  . pantoprazole (PROTONIX) 40 MG tablet Take 1 tablet (40 mg total) by mouth daily. 90 tablet 3  . PREZISTA 800 MG tablet TAKE 1 TABLET BY MOUTH ONCE DAILY WITH BREAKFAST.  TAKE WITH NORVIR. 30 tablet 5  . ranitidine (ZANTAC) 150 MG tablet Take 1 tablet (150 mg total) by mouth 2 (two) times daily. 180  tablet 3  . risperiDONE (RISPERDAL M-TABS) 3 MG disintegrating tablet Take 1 tablet (3 mg total) by mouth 2 (two) times daily. 60 tablet 0  . risperiDONE microspheres (RISPERDAL CONSTA) 50 MG injection Inject 50 mg into the muscle every 30 (thirty) days.     Marland Kitchen. sulfamethoxazole-trimethoprim (BACTRIM DS,SEPTRA DS) 800-160 MG tablet TAKE 1 TABLET BY MOUTH DAILY. (Patient not taking: Reported on 12/21/2015) 30 tablet 1  . TRUVADA 200-300 MG tablet TAKE 1 TABLET BY MOUTH DAILY. 30 tablet 5    Objective: BP 109/63   Pulse 108   Temp 99.8 F (37.7 C) (Oral)   Resp 20   SpO2 93%  Exam: General: 55yo female in NAD sitting in hospital bed speaking loudly and rapidly in full sentences Eyes: EOMI ENTM: MMM Neck: FROM, supple Cardiovascular: RRR, no murmurs noted Respiratory: Normal work of breathing. Mild rales, otherwise clear to auscultation. Good air movement throughout.  Gastrointestinal: +BS, S, NT, ND MSK: No cyanosis or edema Derm: Warm, dry Neuro: Alert and conversational. No focal deficits.  Psych: Loud, rapid, pressured speech, though interruptible. No apparent AVH. Normal thought content.   Labs and Imaging: CBC BMET   Recent Labs Lab 02/27/16 2245  WBC 12.2*  HGB 13.0  HCT 38.4  PLT 430*    Recent Labs Lab 02/27/16 2245  NA 134*  K 3.8  CL 98*  CO2 26  BUN 8  CREATININE 0.99  GLUCOSE 124*  CALCIUM 9.0     Dg Chest 2 View  Result Date: 02/27/2016 CLINICAL DATA:  Acute onset of shortness of breath. Initial encounter. EXAM: CHEST  2 VIEW COMPARISON:  Chest radiograph performed 02/01/2016 FINDINGS: The lungs are well-aerated. Vascular congestion is noted. Increased interstitial markings may reflect mild interstitial edema. There is no evidence of pleural effusion or pneumothorax. The heart is mildly enlarged. No acute osseous abnormalities are seen. IMPRESSION: Vascular congestion and mild cardiomegaly. Increased interstitial markings may reflect mild interstitial  edema. Electronically Signed   By: Roanna Raider M.D.   On: 02/27/2016 23:03   Ardith Dark, MD 02/28/2016, 12:35 AM PGY-2, [facility] FPTS Intern pager: [phone], text pages welcome

## 2016-02-28 NOTE — Discharge Instructions (Signed)
If you were given medicines take as directed.  If you are on coumadin or contraceptives realize their levels and effectiveness is altered by many different medicines.  If you have any reaction (rash, tongues swelling, other) to the medicines stop taking and see a physician.    If your blood pressure was elevated in the ER make sure you follow up for management with a primary doctor or return for chest pain, shortness of breath or stroke symptoms.  Please follow up as directed and return to the ER or see a physician for new or worsening symptoms.  Thank you. Vitals:   02/27/16 2318 02/27/16 2322 02/27/16 2328 02/27/16 2330  BP:    100/71  Pulse:   88 88  Resp:      Temp: 99.8 F (37.7 C)     TempSrc: Oral     SpO2:  100% 100% 100%

## 2016-03-06 ENCOUNTER — Ambulatory Visit: Payer: Self-pay | Admitting: Family Medicine

## 2016-03-07 ENCOUNTER — Telehealth: Payer: Self-pay | Admitting: *Deleted

## 2016-03-07 NOTE — Telephone Encounter (Signed)
[facility] called stating they had faxed a medical clearance form, diabetic form and medicaid form on 02/24/16. They have not heard anything from this office and patient is waiting to pick up her items.  Please give them a call at [phone].  Clovis PuMartin, Princesa Willig L, RN

## 2016-03-08 NOTE — Telephone Encounter (Signed)
I have the forms signed and will fax back today. Please let this clinic know and apologize for the delay. Thanks Latrelle DodrillBrittany J Elazar Argabright, MD

## 2016-03-08 NOTE — Telephone Encounter (Signed)
Clinic informed. Deseree Bruna PotterBlount, CMA

## 2016-03-13 ENCOUNTER — Ambulatory Visit (INDEPENDENT_AMBULATORY_CARE_PROVIDER_SITE_OTHER): Payer: Medicaid Other | Admitting: Family Medicine

## 2016-03-13 VITALS — BP 95/65 | HR 87 | Temp 97.6°F | Ht 59.0 in | Wt 138.4 lb

## 2016-03-13 DIAGNOSIS — R5383 Other fatigue: Secondary | ICD-10-CM | POA: Diagnosis not present

## 2016-03-13 DIAGNOSIS — B20 Human immunodeficiency virus [HIV] disease: Secondary | ICD-10-CM

## 2016-03-13 DIAGNOSIS — E119 Type 2 diabetes mellitus without complications: Secondary | ICD-10-CM | POA: Diagnosis not present

## 2016-03-13 DIAGNOSIS — J45909 Unspecified asthma, uncomplicated: Secondary | ICD-10-CM | POA: Diagnosis not present

## 2016-03-13 DIAGNOSIS — F209 Schizophrenia, unspecified: Secondary | ICD-10-CM

## 2016-03-13 LAB — CBC
HEMATOCRIT: 45.9 % — AB (ref 35.0–45.0)
HEMOGLOBIN: 14.4 g/dL (ref 11.7–15.5)
MCH: 27.9 pg (ref 27.0–33.0)
MCHC: 31.4 g/dL — ABNORMAL LOW (ref 32.0–36.0)
MCV: 89 fL (ref 80.0–100.0)
MPV: 9.8 fL (ref 7.5–12.5)
PLATELETS: 291 10*3/uL (ref 140–400)
RBC: 5.16 MIL/uL — AB (ref 3.80–5.10)
RDW: 18 % — ABNORMAL HIGH (ref 11.0–15.0)
WBC: 6.4 10*3/uL (ref 3.8–10.8)

## 2016-03-13 LAB — COMPLETE METABOLIC PANEL WITH GFR
ALBUMIN: 3.9 g/dL (ref 3.6–5.1)
ALK PHOS: 102 U/L (ref 33–130)
ALT: 7 U/L (ref 6–29)
AST: 13 U/L (ref 10–35)
BILIRUBIN TOTAL: 0.4 mg/dL (ref 0.2–1.2)
BUN: 6 mg/dL — ABNORMAL LOW (ref 7–25)
CO2: 29 mmol/L (ref 20–31)
CREATININE: 1.01 mg/dL (ref 0.50–1.05)
Calcium: 9.2 mg/dL (ref 8.6–10.4)
Chloride: 100 mmol/L (ref 98–110)
GFR, EST AFRICAN AMERICAN: 72 mL/min (ref >=60)
GFR, Est Non African American: 62 mL/min (ref >=60)
Glucose, Bld: 75 mg/dL (ref 65–99)
Potassium: 4.6 mmol/L (ref 3.5–5.3)
Sodium: 138 mmol/L (ref 135–146)
TOTAL PROTEIN: 7 g/dL (ref 6.1–8.1)

## 2016-03-13 LAB — LIPID PANEL
Cholesterol: 155 mg/dL (ref <200)
HDL: 53 mg/dL (ref >50)
LDL CALC: 83 mg/dL (ref <100)
TRIGLYCERIDES: 93 mg/dL (ref <150)
Total CHOL/HDL Ratio: 2.9 Ratio (ref <5.0)
VLDL: 19 mg/dL (ref <30)

## 2016-03-13 LAB — POCT GLYCOSYLATED HEMOGLOBIN (HGB A1C): HEMOGLOBIN A1C: 5.9

## 2016-03-13 MED ORDER — MOMETASONE FURO-FORMOTEROL FUM 200-5 MCG/ACT IN AERO: INHALATION_SPRAY | RESPIRATORY_TRACT | 5 refills | Status: DC

## 2016-03-13 NOTE — Progress Notes (Signed)
Date of Visit: 03/13/2016   HPI:  Patient presents for routine follow up. Accompanied today by her sister.  Sister is concerned that she sleeps all the time and just wants to smoke. Has not discussed this with psychiatrist. Patient smoking 1.5 ppd.   Has ID appointment on Thursday. Compliant with HIV medications. Not currently on antibiotics for prophylaxis.   COPD - using albuterol neb about once per month. Out of dulera. Needs rx for this.  Diabetes - taking metformin, tolerating well. Denies lightheadedness or dizziness. Fasting today.  ROS: See HPI.  PMFSH: history of schizophrenia, HIV, hep B, asthma/COPD, diabetes, tobacco abuse, GERD  PHYSICAL EXAM: BP 95/65 (BP Location: Left Arm, Patient Position: Sitting, Cuff Size: Normal)   Pulse 87   Temp 97.6 F (36.4 C) (Oral)   Ht 4\' 11"  (1.499 m)   Wt 138 lb 6.4 oz (62.8 kg)   SpO2 94%   BMI 27.95 kg/m  Gen: NAD pleasant, cooperative HEENT: normocephalic, atraumatic, moist mucous membranes  Heart: regular rate and rhythm Lungs: clear to auscultation bilaterally, normal work of breathing Neuro: alert grossly nonfocal, speech normal Ext: No appreciable lower extremity edema bilaterally   ASSESSMENT/PLAN:  Health maintenance:  -UTD on HM items  HIV (human immunodeficiency virus infection) (HCC) Continue routine follow up with ID clinic  Asthma Refill dulera. Overall well controlled, using neb only once per month. Patient not interested in quitting smoking. Stressed importance of quitting again today.  Diabetes A1c 5.9, excellent control. Continue metformin.  Cardiac: check lipids today to assess for statin need (likely needs given CV risk factors of diabetes and smoking) Renal: check urine micralbumin today, also renal function via CMET Eye: UTD Foot: UTD Immunizations: UTD   Schizophrenia (HCC) Encouraged sister to speak with treating psychiatrist about concerns regarding patient wanting to sleep all the time,  suspect this is related to psych medications. Offered sleep study, but patient not interested in this as she would not be able to smoke at the study.  FOLLOW UP: Follow up in 3 months with me for routine medical issues  [name], MD [facility]

## 2016-03-13 NOTE — Patient Instructions (Signed)
Sent in refills on dulera Labs today Follow up with me in 3 months  I recommend you quit smoking - let me know if/when you're ready to quit and we can talk about techniques  Be well, Dr. Pollie MeyerMcIntyre

## 2016-03-14 LAB — MICROALBUMIN / CREATININE URINE RATIO
Creatinine, Urine: 191 mg/dL (ref 20–320)
MICROALB UR: 1.2 mg/dL
MICROALB/CREAT RATIO: 6 ug/mg{creat} (ref <30)

## 2016-03-15 ENCOUNTER — Telehealth: Payer: Self-pay | Admitting: Family Medicine

## 2016-03-15 MED ORDER — MOMETASONE FURO-FORMOTEROL FUM 200-5 MCG/ACT IN AERO: INHALATION_SPRAY | RESPIRATORY_TRACT | 5 refills | Status: DC

## 2016-03-15 NOTE — Telephone Encounter (Signed)
Sister called and said that the medication Elwin SleightDulera should have been sent to [facility] on [address]. Can we call this there please. jw

## 2016-03-15 NOTE — Telephone Encounter (Signed)
[facility]ulera sent to [facility] pharmacy.  Clovis PuMartin, Sterlin Knightly L, RN

## 2016-03-16 ENCOUNTER — Ambulatory Visit: Payer: Self-pay | Admitting: Internal Medicine

## 2016-03-17 NOTE — Assessment & Plan Note (Signed)
Refill dulera. Overall well controlled, using neb only once per month. Patient not interested in quitting smoking. Stressed importance of quitting again today.

## 2016-03-17 NOTE — Assessment & Plan Note (Signed)
Encouraged sister to speak with treating psychiatrist about concerns regarding patient wanting to sleep all the time, suspect this is related to psych medications. Offered sleep study, but patient not interested in this as she would not be able to smoke at the study.

## 2016-03-17 NOTE — Assessment & Plan Note (Signed)
Continue routine follow up with ID clinic

## 2016-03-17 NOTE — Assessment & Plan Note (Signed)
A1c 5.9, excellent control. Continue metformin.  Cardiac: check lipids today to assess for statin need (likely needs given CV risk factors of diabetes and smoking) Renal: check urine micralbumin today, also renal function via CMET Eye: UTD Foot: UTD Immunizations: UTD

## 2016-03-22 ENCOUNTER — Telehealth: Payer: Self-pay | Admitting: Family Medicine

## 2016-03-22 MED ORDER — ATORVASTATIN CALCIUM 10 MG PO TABS: 10.0000 mg | ORAL_TABLET | Freq: Every day | ORAL | 3 refills | Status: AC

## 2016-03-22 NOTE — Telephone Encounter (Signed)
10 year ASCVD risk calculated at 5.1%. With diabetes and smoking, statin is recommended. Will initiate moderate dose statin, atorvastatin 10mg  daily. Called patient to discuss this, she is agreeable.   Her brother did complain that she has been sleeping a lot more lately and just wants to smoke cigarettes. Advised she come for visit or they reach out to one of her other doctors to discuss more.  Latrelle DodrillBrittany J Jazae Gandolfi, MD

## 2016-04-04 ENCOUNTER — Other Ambulatory Visit: Payer: Self-pay | Admitting: Internal Medicine

## 2016-04-04 DIAGNOSIS — B20 Human immunodeficiency virus [HIV] disease: Secondary | ICD-10-CM

## 2016-04-06 ENCOUNTER — Other Ambulatory Visit: Payer: Self-pay

## 2016-04-20 ENCOUNTER — Ambulatory Visit (INDEPENDENT_AMBULATORY_CARE_PROVIDER_SITE_OTHER): Payer: Medicaid Other | Admitting: Internal Medicine

## 2016-04-20 ENCOUNTER — Encounter: Payer: Self-pay | Admitting: Internal Medicine

## 2016-04-20 VITALS — BP 94/66 | HR 96 | Temp 97.9°F | Ht 59.0 in | Wt 133.0 lb

## 2016-04-20 DIAGNOSIS — B20 Human immunodeficiency virus [HIV] disease: Secondary | ICD-10-CM | POA: Diagnosis present

## 2016-04-20 DIAGNOSIS — F203 Undifferentiated schizophrenia: Secondary | ICD-10-CM | POA: Diagnosis not present

## 2016-04-20 DIAGNOSIS — Z9119 Patient's noncompliance with other medical treatment and regimen: Secondary | ICD-10-CM | POA: Diagnosis not present

## 2016-04-20 DIAGNOSIS — Z789 Other specified health status: Secondary | ICD-10-CM

## 2016-04-20 DIAGNOSIS — Z7289 Other problems related to lifestyle: Secondary | ICD-10-CM

## 2016-04-20 DIAGNOSIS — Z91199 Patient's noncompliance with other medical treatment and regimen due to unspecified reason: Secondary | ICD-10-CM

## 2016-04-20 LAB — COMPLETE METABOLIC PANEL WITH GFR
ALT: 9 U/L (ref 6–29)
AST: 13 U/L (ref 10–35)
Albumin: 4 g/dL (ref 3.6–5.1)
Alkaline Phosphatase: 97 U/L (ref 33–130)
BILIRUBIN TOTAL: 0.3 mg/dL (ref 0.2–1.2)
BUN: 14 mg/dL (ref 7–25)
CALCIUM: 9.4 mg/dL (ref 8.6–10.4)
CO2: 25 mmol/L (ref 20–31)
CREATININE: 0.92 mg/dL (ref 0.50–1.05)
Chloride: 103 mmol/L (ref 98–110)
GFR, EST AFRICAN AMERICAN: 80 mL/min (ref >=60)
GFR, Est Non African American: 70 mL/min (ref >=60)
Glucose, Bld: 80 mg/dL (ref 65–99)
Potassium: 4.8 mmol/L (ref 3.5–5.3)
Sodium: 136 mmol/L (ref 135–146)
TOTAL PROTEIN: 7.3 g/dL (ref 6.1–8.1)

## 2016-04-20 LAB — LIPID PANEL
CHOLESTEROL: 169 mg/dL (ref <200)
HDL: 56 mg/dL (ref >50)
LDL Cholesterol: 99 mg/dL (ref <100)
Total CHOL/HDL Ratio: 3 Ratio (ref <5.0)
Triglycerides: 71 mg/dL (ref <150)
VLDL: 14 mg/dL (ref <30)

## 2016-04-20 LAB — CBC WITH DIFFERENTIAL/PLATELET
BASOS PCT: 0 %
Basophils Absolute: 0 cells/uL (ref 0–200)
EOS ABS: 116 {cells}/uL (ref 15–500)
Eosinophils Relative: 2 %
HEMATOCRIT: 47.4 % — AB (ref 35.0–45.0)
HEMOGLOBIN: 15.6 g/dL — AB (ref 11.7–15.5)
Lymphocytes Relative: 44 %
Lymphs Abs: 2552 cells/uL (ref 850–3900)
MCH: 28 pg (ref 27.0–33.0)
MCHC: 32.9 g/dL (ref 32.0–36.0)
MCV: 85.1 fL (ref 80.0–100.0)
MONO ABS: 406 {cells}/uL (ref 200–950)
MPV: 9.6 fL (ref 7.5–12.5)
Monocytes Relative: 7 %
NEUTROS ABS: 2726 {cells}/uL (ref 1500–7800)
Neutrophils Relative %: 47 %
Platelets: 200 10*3/uL (ref 140–400)
RBC: 5.57 MIL/uL — ABNORMAL HIGH (ref 3.80–5.10)
RDW: 19.7 % — ABNORMAL HIGH (ref 11.0–15.0)
WBC: 5.8 10*3/uL (ref 3.8–10.8)

## 2016-04-20 NOTE — Progress Notes (Signed)
Rfv: hiv disease  Patient ID: [name], female   DOB: 03/27/1961, 56 y.o.   MRN: [id number]  HPI 56yo F with long standing hiv disease, schizophrenia,learning disability. CD 4 count of 790/VL 40 in July 2017 on truvada/DRVr She is here with her sister. Who reports that [name] has had increasing of etoh use, and  left house on Monday without telling anyone. Sister is concerned. Patient is willing to stop drinking. Had 3 beers on the day of question. There is concern that she is unclear if she is taking her meds daily  Outpatient Encounter Prescriptions as of 04/20/2016  Medication Sig  . albuterol (PROVENTIL) (2.5 MG/3ML) 0.083% nebulizer solution INHALE CONTENTS OF 1 VIAL USING NEBULIZER EVERY 6 HOURS AS NEEDED FOR WHEEZING  . divalproex (DEPAKOTE SPRINKLE) 125 MG capsule TAKE 4 CAPSULES (500 MG TOTAL) BY MOUTH 2 (TWO) TIMES DAILY.  . metFORMIN (GLUCOPHAGE) 500 MG tablet TAKE 1 TABLET BY MOUTH 2 TIMES DAILY WITH A MEAL  . mometasone-formoterol (DULERA) 200-5 MCG/ACT AERO INHALE 2 PUFFS INTO THE LUNGS TWICE DAILY  . Nebulizers (CLEVER CHOICE NEBULIZER) MISC 1 application by Does not apply route every 6 (six) hours as needed. Please provide 1 nebulizer machine to use via inhalation Q6 hours prn SOB  . NORVIR 100 MG TABS tablet TAKE 1 TABLET BY MOUTH ONCE DAILY WITH PREZISTA  . pantoprazole (PROTONIX) 40 MG tablet Take 1 tablet (40 mg total) by mouth daily.  Marland Kitchen. PREZISTA 800 MG tablet TAKE 1 TABLET BY MOUTH ONCE DAILY WITH BREAKFAST.  TAKE WITH NORVIR.  . ranitidine (ZANTAC) 150 MG tablet Take 1 tablet (150 mg total) by mouth 2 (two) times daily.  . risperiDONE (RISPERDAL M-TABS) 3 MG disintegrating tablet Take 1 tablet (3 mg total) by mouth 2 (two) times daily.  . risperiDONE microspheres (RISPERDAL CONSTA) 50 MG injection Inject 50 mg into the muscle every 30 (thirty) days.   . TRUVADA 200-300 MG tablet TAKE 1 TABLET BY MOUTH DAILY.  Marland Kitchen. atorvastatin (LIPITOR) 10 MG tablet Take 1 tablet (10 mg  total) by mouth daily. (Patient not taking: Reported on 04/20/2016)   No facility-administered encounter medications on file as of 04/20/2016.      Patient Active Problem List   Diagnosis Date Noted  . Asthmatic bronchitis 02/27/2016  . Xerosis of skin 08/24/2015  . GERD (gastroesophageal reflux disease) 08/19/2015  . Abnormal echocardiogram 07/24/2015  . Murmur, cardiac 06/21/2015  . Chronic hypercapnic respiratory failure (HCC)   . Diabetes (HCC) 12/01/2014  . Paranoid schizophrenia (HCC)   . Hyponatremia 10/22/2014  . Microscopic hematuria 01/29/2014  . Abdominal pain 12/12/2013  . Tobacco use disorder 02/18/2013  . Hepatitis B infection   . Pre-ulcerative corn or callous 05/07/2012  . Urinary incontinence, nocturnal enuresis 05/07/2012  . Fecal incontinence 05/07/2012  . Systolic murmur 05/07/2012  . Routine health maintenance 05/07/2012  . Asthma 08/18/2010  . HIV (human immunodeficiency virus infection) (HCC) 08/18/2010  . Schizophrenia (HCC) 08/18/2010  . Herpes simplex infection 08/18/2010     Health Maintenance Due  Topic Date Due  . OPHTHALMOLOGY EXAM  04/07/2016     Review of Systems Review of Systems  Constitutional: Negative for fever, chills, diaphoresis, activity change, appetite change, fatigue and unexpected weight change.  HENT: Negative for congestion, sore throat, rhinorrhea, sneezing, trouble swallowing and sinus pressure.  Eyes: Negative for photophobia and visual disturbance.  Respiratory: Negative for cough, chest tightness, shortness of breath, wheezing and stridor.  Cardiovascular: Negative for chest pain,  palpitations and leg swelling.  Gastrointestinal: Negative for nausea, vomiting, abdominal pain, diarrhea, constipation, blood in stool, abdominal distention and anal bleeding.  Genitourinary: Negative for dysuria, hematuria, flank pain and difficulty urinating.  Musculoskeletal: Negative for myalgias, back pain, joint swelling, arthralgias and  gait problem.  Skin: Negative for color change, pallor, rash and wound.  Neurological: Negative for dizziness, tremors, weakness and light-headedness.  Hematological: Negative for adenopathy. Does not bruise/bleed easily.  Psychiatric/Behavioral: Negative for behavioral problems, confusion, sleep disturbance, dysphoric mood, decreased concentration and agitation.    Physical Exam   BP 94/66   Pulse 96   Temp 97.9 F (36.6 C) (Oral)   Ht 4\' 11"  (1.499 m)   Wt 133 lb (60.3 kg)   BMI 26.86 kg/m   Physical Exam  Constitutional:  oriented to person, place, and time. appears well-developed and well-nourished. No distress.  HENT: [place]/AT, PERRLA, no scleral icterus,edentulous Mouth/Throat: Oropharynx is clear and moist. No oropharyngeal exudate.  Cardiovascular: Normal rate, regular rhythm and normal heart sounds. Exam reveals no gallop and no friction rub.  No murmur heard.  Pulmonary/Chest: Effort normal and breath sounds normal. No respiratory distress.  has no wheezes.  Neck = supple, no nuchal rigidity Abdominal: Soft. Bowel sounds are normal.  exhibits no distension. There is no tenderness.  Lymphadenopathy: no cervical adenopathy. No axillary adenopathy Neurological: alert and oriented to person, place, and time.  Skin: Skin is warm and dry. No rash noted. No erythema.  Psychiatric: a normal mood and affect.  behavior is normal.   Lab Results  Component Value Date   CD4TCELL 35 10/14/2015   Lab Results  Component Value Date   CD4TABS 790 10/14/2015   CD4TABS 840 07/22/2015   CD4TABS 50 (L) 06/07/2015   Lab Results  Component Value Date   HIV1RNAQUANT 40 (H) 10/14/2015   No results found for: HEPBSAB Lab Results  Component Value Date   LABRPR NON REAC 02/11/2015    CBC Lab Results  Component Value Date   WBC 6.4 03/13/2016   RBC 5.16 (H) 03/13/2016   HGB 14.4 03/13/2016   HCT 45.9 (H) 03/13/2016   PLT 291 03/13/2016   MCV 89.0 03/13/2016   MCH 27.9 03/13/2016     MCHC 31.4 (L) 03/13/2016   RDW 18.0 (H) 03/13/2016   LYMPHSABS 2.3 02/27/2016   MONOABS 0.9 02/27/2016   EOSABS 0.0 02/27/2016    BMET Lab Results  Component Value Date   NA 138 03/13/2016   K 4.6 03/13/2016   CL 100 03/13/2016   CO2 29 03/13/2016   GLUCOSE 75 03/13/2016   BUN 6 (L) 03/13/2016   CREATININE 1.01 03/13/2016   CALCIUM 9.2 03/13/2016   GFRNONAA 62 03/13/2016   GFRAA 72 03/13/2016      Assessment and Plan  HIV disease = due to concern for poor adherence, she is due for 6 month HIV Labs  rtc in 3 wks to eval for adherence counseling  Health maintenance = She had flu vaccination already. Up todate otherwise.  Alcohol use = patient is being restricted for alcohol per her family who she lives with.  schizophernia = appears controlled, appropriate behavior during this visit. Still would like her to see counselor or psychiatry given recent events  Spent 30 min with patient with greater than 50% on adherence counseling for hiv disease

## 2016-04-21 ENCOUNTER — Telehealth: Payer: Self-pay | Admitting: *Deleted

## 2016-04-21 LAB — RPR

## 2016-04-21 LAB — T-HELPER CELL (CD4) - (RCID CLINIC ONLY)
CD4 % Helper T Cell: 31 % — ABNORMAL LOW (ref 33–55)
CD4 T CELL ABS: 880 /uL (ref 400–2700)

## 2016-04-21 NOTE — Telephone Encounter (Signed)
Received call from Cliffton [facility]stersAmanda [facility]. Corrected CD4 absolute is 880 and percentage is 31, now reflected accurately in lab results. Andree CossHowell, Joory Gough M, RN

## 2016-04-24 LAB — HIV-1 RNA,QN PCR W/REFLEX GENOTYPE: HIV-1 RNA, QN PCR: <20 copies/mL (ref <20)

## 2016-04-26 ENCOUNTER — Ambulatory Visit: Payer: Medicaid Other

## 2016-04-26 ENCOUNTER — Ambulatory Visit: Payer: Medicaid Other | Admitting: Podiatry

## 2016-05-03 ENCOUNTER — Ambulatory Visit (INDEPENDENT_AMBULATORY_CARE_PROVIDER_SITE_OTHER): Payer: Medicaid Other | Admitting: Podiatry

## 2016-05-03 DIAGNOSIS — M79676 Pain in unspecified toe(s): Secondary | ICD-10-CM

## 2016-05-03 DIAGNOSIS — L603 Nail dystrophy: Secondary | ICD-10-CM

## 2016-05-03 DIAGNOSIS — L608 Other nail disorders: Secondary | ICD-10-CM

## 2016-05-03 DIAGNOSIS — L84 Corns and callosities: Secondary | ICD-10-CM

## 2016-05-03 DIAGNOSIS — M79672 Pain in left foot: Secondary | ICD-10-CM

## 2016-05-03 DIAGNOSIS — B351 Tinea unguium: Secondary | ICD-10-CM

## 2016-05-03 DIAGNOSIS — L851 Acquired keratosis [keratoderma] palmaris et plantaris: Secondary | ICD-10-CM

## 2016-05-03 DIAGNOSIS — M79671 Pain in right foot: Secondary | ICD-10-CM

## 2016-05-03 DIAGNOSIS — E0843 Diabetes mellitus due to underlying condition with diabetic autonomic (poly)neuropathy: Secondary | ICD-10-CM

## 2016-05-03 DIAGNOSIS — M79609 Pain in unspecified limb: Principal | ICD-10-CM

## 2016-05-05 ENCOUNTER — Other Ambulatory Visit: Payer: Self-pay | Admitting: Internal Medicine

## 2016-05-05 DIAGNOSIS — B20 Human immunodeficiency virus [HIV] disease: Secondary | ICD-10-CM

## 2016-05-14 NOTE — Progress Notes (Signed)
   SUBJECTIVE Patient with a history of diabetes mellitus presents to office today complaining of elongated, thickened nails. Pain while ambulating in shoes. Patient is unable to trim their own nails.   OBJECTIVE General Patient is awake, alert, and oriented x 3 and in no acute distress. Derm painful hyperkeratotic callus lesions noted to the left heel and the right fifth MPJ Skin is dry and supple bilateral. Negative open lesions or macerations. Remaining integument unremarkable. Nails are tender, long, thickened and dystrophic with subungual debris, consistent with onychomycosis, 1-5 bilateral. No signs of infection noted. Vasc  DP and PT pedal pulses palpable bilaterally. Temperature gradient within normal limits.  Neuro Epicritic and protective threshold sensation diminished bilaterally.  Musculoskeletal Exam No symptomatic pedal deformities noted bilateral. Muscular strength within normal limits.  ASSESSMENT 1. Diabetes Mellitus w/ peripheral neuropathy 2. Onychomycosis of nail due to dermatophyte bilateral 3. Pain in foot bilateral 4. Painful callus lesions bilateral feet 2  PLAN OF CARE 1. Patient evaluated today. 2. Instructed to maintain good pedal hygiene and foot care. Stressed importance of controlling blood sugar.  3. Mechanical debridement of nails 1-5 bilaterally performed using a nail nipper. Filed with dremel without incident.  4. Excisional debridement of painful callus lesions was performed using a chisel blade without incident or bleeding. Salicylic acid applied as a bandage. 5. Return to clinic in 3 mos.     Felecia ShellingBrent M. Evans, DPM [facility]  Dr. Felecia ShellingBrent M. Evans, DPM    [address]                                        [place], [place] [address]                Office [phone]  Fax [phone]

## 2016-05-23 ENCOUNTER — Other Ambulatory Visit: Payer: Medicaid Other

## 2016-05-23 ENCOUNTER — Ambulatory Visit: Payer: Self-pay | Admitting: Internal Medicine

## 2016-06-08 ENCOUNTER — Other Ambulatory Visit: Payer: Self-pay | Admitting: Internal Medicine

## 2016-06-08 ENCOUNTER — Ambulatory Visit
Admission: RE | Admit: 2016-06-08 | Discharge: 2016-06-08 | Disposition: A | Discharge status: Home / Self Care | Payer: Medicaid Other | Source: Ambulatory Visit | Attending: Internal Medicine | Admitting: Internal Medicine

## 2016-06-08 ENCOUNTER — Ambulatory Visit (INDEPENDENT_AMBULATORY_CARE_PROVIDER_SITE_OTHER): Payer: Medicaid Other | Admitting: Internal Medicine

## 2016-06-08 ENCOUNTER — Encounter: Payer: Self-pay | Admitting: Internal Medicine

## 2016-06-08 VITALS — Ht 59.0 in | Wt 136.0 lb

## 2016-06-08 DIAGNOSIS — B20 Human immunodeficiency virus [HIV] disease: Secondary | ICD-10-CM | POA: Diagnosis present

## 2016-06-08 DIAGNOSIS — M25551 Pain in right hip: Secondary | ICD-10-CM | POA: Diagnosis not present

## 2016-06-08 DIAGNOSIS — Z23 Encounter for immunization: Secondary | ICD-10-CM | POA: Diagnosis not present

## 2016-06-08 DIAGNOSIS — F203 Undifferentiated schizophrenia: Secondary | ICD-10-CM | POA: Diagnosis not present

## 2016-06-08 DIAGNOSIS — Z789 Other specified health status: Secondary | ICD-10-CM | POA: Diagnosis not present

## 2016-06-08 DIAGNOSIS — Z7289 Other problems related to lifestyle: Secondary | ICD-10-CM

## 2016-06-08 LAB — CBC WITH DIFFERENTIAL/PLATELET
BASOS ABS: 0 {cells}/uL (ref 0–200)
Basophils Relative: 0 %
EOS ABS: 144 {cells}/uL (ref 15–500)
Eosinophils Relative: 2 %
HCT: 44 % (ref 35.0–45.0)
Hemoglobin: 14.4 g/dL (ref 11.7–15.5)
LYMPHS PCT: 36 %
Lymphs Abs: 2592 cells/uL (ref 850–3900)
MCH: 27.7 pg (ref 27.0–33.0)
MCHC: 32.7 g/dL (ref 32.0–36.0)
MCV: 84.6 fL (ref 80.0–100.0)
MONOS PCT: 11 %
MPV: 9.4 fL (ref 7.5–12.5)
Monocytes Absolute: 792 cells/uL (ref 200–950)
NEUTROS PCT: 51 %
Neutro Abs: 3672 cells/uL (ref 1500–7800)
PLATELETS: 286 10*3/uL (ref 140–400)
RBC: 5.2 MIL/uL — ABNORMAL HIGH (ref 3.80–5.10)
RDW: 21.3 % — AB (ref 11.0–15.0)
WBC: 7.2 10*3/uL (ref 3.8–10.8)

## 2016-06-08 LAB — COMPLETE METABOLIC PANEL WITH GFR
ALT: 8 U/L (ref 6–29)
AST: 18 U/L (ref 10–35)
Albumin: 4.1 g/dL (ref 3.6–5.1)
Alkaline Phosphatase: 107 U/L (ref 33–130)
BILIRUBIN TOTAL: 0.4 mg/dL (ref 0.2–1.2)
BUN: 18 mg/dL (ref 7–25)
CO2: 28 mmol/L (ref 20–31)
Calcium: 9.2 mg/dL (ref 8.6–10.4)
Chloride: 99 mmol/L (ref 98–110)
Creat: 1.06 mg/dL — ABNORMAL HIGH (ref 0.50–1.05)
GFR, EST AFRICAN AMERICAN: 68 mL/min (ref >=60)
GFR, EST NON AFRICAN AMERICAN: 59 mL/min — AB (ref >=60)
Glucose, Bld: 53 mg/dL — ABNORMAL LOW (ref 65–99)
POTASSIUM: 6 mmol/L — AB (ref 3.5–5.3)
Sodium: 135 mmol/L (ref 135–146)
TOTAL PROTEIN: 7.3 g/dL (ref 6.1–8.1)

## 2016-06-08 LAB — LIPID PANEL
Cholesterol: 154 mg/dL (ref <200)
HDL: 58 mg/dL (ref >50)
LDL Cholesterol: 77 mg/dL (ref <100)
Total CHOL/HDL Ratio: 2.7 Ratio (ref <5.0)
Triglycerides: 96 mg/dL (ref <150)
VLDL: 19 mg/dL (ref <30)

## 2016-06-08 NOTE — Progress Notes (Signed)
Patient ID: [name], female   DOB: 03/28/1961, 56 y.o.   MRN: [id number]  HPI 56yo F with hiv disease, schizophrenia, alcohol dependence, cd 4 count 880/VL 40. Currently supposed to be on truvada/DRVr, which are packaged for her by her family. She has started intake [facility] to hopefully be able to start program. She states that roughly 2 weeks ago she heard her hip pop and since then has had right sided groin pain.  Outpatient Encounter Prescriptions as of 06/08/2016  Medication Sig  . albuterol (PROVENTIL) (2.5 MG/3ML) 0.083% nebulizer solution INHALE CONTENTS OF 1 VIAL USING NEBULIZER EVERY 6 HOURS AS NEEDED FOR WHEEZING  . divalproex (DEPAKOTE SPRINKLE) 125 MG capsule TAKE 4 CAPSULES (500 MG TOTAL) BY MOUTH 2 (TWO) TIMES DAILY.  . metFORMIN (GLUCOPHAGE) 500 MG tablet TAKE 1 TABLET BY MOUTH 2 TIMES DAILY WITH A MEAL  . mometasone-formoterol (DULERA) 200-5 MCG/ACT AERO INHALE 2 PUFFS INTO THE LUNGS TWICE DAILY  . Nebulizers (CLEVER CHOICE NEBULIZER) MISC 1 application by Does not apply route every 6 (six) hours as needed. Please provide 1 nebulizer machine to use via inhalation Q6 hours prn SOB  . NORVIR 100 MG TABS tablet TAKE 1 TABLET BY MOUTH ONCE DAILY WITH PREZISTA  . pantoprazole (PROTONIX) 40 MG tablet Take 1 tablet (40 mg total) by mouth daily.  Marland Kitchen PREZISTA 800 MG tablet TAKE 1 TABLET BY MOUTH ONCE DAILY WITH BREAKFAST.  TAKE WITH NORVIR.  . ranitidine (ZANTAC) 150 MG tablet Take 1 tablet (150 mg total) by mouth 2 (two) times daily.  . risperiDONE (RISPERDAL M-TABS) 3 MG disintegrating tablet Take 1 tablet (3 mg total) by mouth 2 (two) times daily.  . risperiDONE microspheres (RISPERDAL CONSTA) 50 MG injection Inject 50 mg into the muscle every 30 (thirty) days.   . TRUVADA 200-300 MG tablet TAKE 1 TABLET BY MOUTH DAILY.  Marland Kitchen atorvastatin (LIPITOR) 10 MG tablet Take 1 tablet (10 mg total) by mouth daily. (Patient not taking: Reported on 04/20/2016)   No  facility-administered encounter medications on file as of 06/08/2016.      Patient Active Problem List   Diagnosis Date Noted  . Asthmatic bronchitis 02/27/2016  . Xerosis of skin 08/24/2015  . GERD (gastroesophageal reflux disease) 08/19/2015  . Abnormal echocardiogram 07/24/2015  . Murmur, cardiac 06/21/2015  . Chronic hypercapnic respiratory failure (HCC)   . Diabetes (HCC) 12/01/2014  . Paranoid schizophrenia (HCC)   . Hyponatremia 10/22/2014  . Microscopic hematuria 01/29/2014  . Abdominal pain 12/12/2013  . Tobacco use disorder 02/18/2013  . Hepatitis B infection   . Pre-ulcerative corn or callous 05/07/2012  . Urinary incontinence, nocturnal enuresis 05/07/2012  . Fecal incontinence 05/07/2012  . Systolic murmur 05/07/2012  . Routine health maintenance 05/07/2012  . Asthma 08/18/2010  . HIV (human immunodeficiency virus infection) (HCC) 08/18/2010  . Schizophrenia (HCC) 08/18/2010  . Herpes simplex infection 08/18/2010     Health Maintenance Due  Topic Date Due  . OPHTHALMOLOGY EXAM  04/07/2016     Review of Systems Per hpi, otherwise 10 point ros is negative Physical Exam   Ht 4\' 11"  (1.499 m)   Wt 136 lb (61.7 kg)   BMI 27.47 kg/m   Physical Exam  Constitutional:  oriented to person, place, and time. appears well-developed and well-nourished. No distress.  HENT: [place]/AT, PERRLA, no scleral icterus Mouth/Throat: Oropharynx is clear and moist. No oropharyngeal exudate.  Cardiovascular: Normal rate, regular rhythm and normal heart sounds. Exam reveals  no gallop and no friction rub.  No murmur heard.  Pulmonary/Chest: Effort normal and breath sounds normal. No respiratory distress.  has no wheezes.  Neck = supple, no nuchal rigidity Abdominal: Soft. Bowel sounds are normal.  exhibits no distension. There is no tenderness.  Lymphadenopathy: no cervical adenopathy. No axillary adenopathy Neurological: alert and oriented to person, place, and time.  Skin: Skin is  warm and dry. No rash noted. No erythema.  Psychiatric: a normal mood and affect.  behavior is normal.   Lab Results  Component Value Date   CD4TCELL 31 (L) 04/20/2016   Lab Results  Component Value Date   CD4TABS 880 04/20/2016   CD4TABS 790 10/14/2015   CD4TABS 840 07/22/2015   Lab Results  Component Value Date   HIV1RNAQUANT 40 (H) 10/14/2015   No results found for: HEPBSAB Lab Results  Component Value Date   LABRPR NON REAC 04/20/2016    CBC Lab Results  Component Value Date   WBC 5.8 04/20/2016   RBC 5.57 (H) 04/20/2016   HGB 15.6 (H) 04/20/2016   HCT 47.4 (H) 04/20/2016   PLT 200 04/20/2016   MCV 85.1 04/20/2016   MCH 28.0 04/20/2016   MCHC 32.9 04/20/2016   RDW 19.7 (H) 04/20/2016   LYMPHSABS 2,552 04/20/2016   MONOABS 406 04/20/2016   EOSABS 116 04/20/2016    BMET Lab Results  Component Value Date   NA 136 04/20/2016   K 4.8 04/20/2016   CL 103 04/20/2016   CO2 25 04/20/2016   GLUCOSE 80 04/20/2016   BUN 14 04/20/2016   CREATININE 0.92 04/20/2016   CALCIUM 9.4 04/20/2016   GFRNONAA 70 04/20/2016   GFRAA 80 04/20/2016      Assessment and Plan   hiv disease= will check labs today since she is now taking her medications more routinely  Health maintenance = will give meningococcal vaccine  Hip pain = will check hip xray. May have ligament/tendonitis. Can try tylenol for pain  Alcohol abuse = her family reports she is drinking less, since limited access  Schizophrenia = possibly getting involved in [facility] to help with more interaction and routine

## 2016-06-09 LAB — T-HELPER CELL (CD4) - (RCID CLINIC ONLY)
CD4 % Helper T Cell: 31 % — ABNORMAL LOW (ref 33–55)
CD4 T Cell Abs: 820 /uL (ref 400–2700)

## 2016-06-09 LAB — RPR

## 2016-06-12 LAB — HIV-1 RNA QUANT-NO REFLEX-BLD
HIV 1 RNA Quant: <20 copies/mL — AB
HIV-1 RNA QUANT, LOG: DETECTED {Log_copies}/mL — AB

## 2016-06-16 ENCOUNTER — Encounter (HOSPITAL_COMMUNITY): Payer: Self-pay | Admitting: Emergency Medicine

## 2016-06-16 ENCOUNTER — Telehealth: Payer: Self-pay | Admitting: Family Medicine

## 2016-06-16 ENCOUNTER — Emergency Department (HOSPITAL_COMMUNITY)
Admission: EM | Admit: 2016-06-16 | Discharge: 2016-06-16 | Disposition: A | Discharge status: Home / Self Care | Payer: Medicaid Other | Attending: Emergency Medicine | Admitting: Emergency Medicine

## 2016-06-16 DIAGNOSIS — Z7984 Long term (current) use of oral hypoglycemic drugs: Secondary | ICD-10-CM | POA: Insufficient documentation

## 2016-06-16 DIAGNOSIS — F17210 Nicotine dependence, cigarettes, uncomplicated: Secondary | ICD-10-CM | POA: Insufficient documentation

## 2016-06-16 DIAGNOSIS — M25551 Pain in right hip: Secondary | ICD-10-CM | POA: Diagnosis present

## 2016-06-16 DIAGNOSIS — M1611 Unilateral primary osteoarthritis, right hip: Secondary | ICD-10-CM | POA: Diagnosis not present

## 2016-06-16 DIAGNOSIS — E119 Type 2 diabetes mellitus without complications: Secondary | ICD-10-CM | POA: Diagnosis not present

## 2016-06-16 DIAGNOSIS — Z79899 Other long term (current) drug therapy: Secondary | ICD-10-CM | POA: Insufficient documentation

## 2016-06-16 DIAGNOSIS — J45909 Unspecified asthma, uncomplicated: Secondary | ICD-10-CM | POA: Insufficient documentation

## 2016-06-16 MED ORDER — ACETAMINOPHEN 325 MG PO TABS: 650.0000 mg | ORAL_TABLET | Freq: Four times a day (QID) | ORAL | 0 refills | Status: DC | PRN

## 2016-06-16 NOTE — ED Triage Notes (Signed)
Per EMS, patient from home, reports right hip and groin pain x1 month. Reports pain worsens when "spreading her legs." Ambulatory with EMS. States she had XR at PCP "but did not want to wait until Monday to get the results."

## 2016-06-16 NOTE — ED Provider Notes (Signed)
[facility]-EMER[facility]ENCY DEPT Provider Note   CSN: [id number] Arrival date & time: 06/16/16  1609   By signing my name below, I, [name], attest that this documentation has been prepared under the direction and in the presence of Jackie AmorJulie Jolean Madariaga, PA-C. Electronically Signed: Teofilo PodMatthew P. Hensley, ED Scribe. 06/16/2016. 6:01 PM.   History   Chief Complaint Chief Complaint  Patient presents with  . Leg Pain   The history is provided by the patient. No language interpreter was used.   HPI Comments:  [name] is a 56 y.o. female with PMHx of AIDS and DM who presents to the Emergency Department complaining of ongoing right hip and groin pain x 1 month. She states that the pain is primarily in her upper right and leg radiating to her right hip, and is worse when spreading her legs (demonstrates right leg abduction). Pt reports that she had an x-ray at her PCP 1 week ago, but did not want to wait until 06/19/16 for the results so she came here. Pt is ambulatory with a cane. Pt notes a deformity to her right calf from a previous accident. No alleviating factors noted. Pt denies knee pain.   PCP: Jackie FeinsteinBrittany McIntyre, MD   Past Medical History:  Diagnosis Date  . AIDS (HCC) 12-10-2007  . Cataract   . Chronic asthmatic bronchitis (HCC)   . Diabetes mellitus without complication (HCC)    type 2 with out complication  . GERD (gastroesophageal reflux disease)   . Hepatitis B infection    followed by ID  . Herpes simplex   . History of cholecystectomy   . Schizophrenia (HCC)   . Seizures (HCC)   . Tobacco use   . Tuberculin test reaction 11-2008 [facility]     Patient Active Problem List   Diagnosis Date Noted  . Asthmatic bronchitis 02/27/2016  . Xerosis of skin 08/24/2015  . GERD (gastroesophageal reflux disease) 08/19/2015  . Abnormal echocardiogram 07/24/2015  . Murmur, cardiac 06/21/2015  . Chronic hypercapnic respiratory failure (HCC)   . Diabetes (HCC) 12/01/2014  . Paranoid schizophrenia  (HCC)   . Hyponatremia 10/22/2014  . Microscopic hematuria 01/29/2014  . Abdominal pain 12/12/2013  . Tobacco use disorder 02/18/2013  . Hepatitis B infection   . Pre-ulcerative corn or callous 05/07/2012  . Urinary incontinence, nocturnal enuresis 05/07/2012  . Fecal incontinence 05/07/2012  . Systolic murmur 05/07/2012  . Routine health maintenance 05/07/2012  . Asthma 08/18/2010  . HIV (human immunodeficiency virus infection) (HCC) 08/18/2010  . Schizophrenia (HCC) 08/18/2010  . Herpes simplex infection 08/18/2010    Past Surgical History:  Procedure Laterality Date  . CHOLECYSTECTOMY    . LEG SURGERY Right    s/p accident; "steel fell on the back of my leg"  . TONSILLECTOMY      OB History    No data available       Home Medications    Prior to Admission medications   Medication Sig Start Date End Date Taking? Authorizing Provider  acetaminophen (TYLENOL) 325 MG tablet Take 2 tablets (650 mg total) by mouth every 6 (six) hours as needed for moderate pain. 06/16/16   Jackie AmorJulie Mart Colpitts, PA-C  albuterol (PROVENTIL) (2.5 MG/3ML) 0.083% nebulizer solution INHALE CONTENTS OF 1 VIAL USING NEBULIZER EVERY 6 HOURS AS NEEDED FOR WHEEZING 06/22/15   Jackie DodrillBrittany J McIntyre, MD  atorvastatin (LIPITOR) 10 MG tablet Take 1 tablet (10 mg total) by mouth daily. Patient not taking: Reported on 04/20/2016 03/22/16   GrenadaBrittany J  Pollie Meyer, MD  divalproex (DEPAKOTE SPRINKLE) 125 MG capsule TAKE 4 CAPSULES (500 MG TOTAL) BY MOUTH 2 (TWO) TIMES DAILY. 06/08/15   Jackie Dodrill, MD  metFORMIN (GLUCOPHAGE) 500 MG tablet TAKE 1 TABLET BY MOUTH 2 TIMES DAILY WITH A MEAL 12/21/15   Jackie Dodrill, MD  mometasone-formoterol [facility]) 200-5 MCG/ACT AERO INHALE 2 PUFFS INTO THE LUNGS TWICE DAILY 03/15/16   Jackie Dodrill, MD  Nebulizers (CLEVER CHOICE NEBULIZER) MISC 1 application by Does not apply route every 6 (six) hours as needed. Please provide 1 nebulizer machine to use via inhalation Q6 hours prn  SOB 10/27/15   Jackie Grim, MD  NORVIR 100 MG TABS tablet TAKE 1 TABLET BY MOUTH ONCE DAILY WITH PREZISTA 05/05/16   Jackie Munson, MD  pantoprazole (PROTONIX) 40 MG tablet Take 1 tablet (40 mg total) by mouth daily. 12/21/15   Jackie Dodrill, MD  PREZISTA 800 MG tablet TAKE 1 TABLET BY MOUTH ONCE DAILY WITH BREAKFAST.  TAKE WITH NORVIR. 05/05/16   Jackie Munson, MD  ranitidine (ZANTAC) 150 MG tablet Take 1 tablet (150 mg total) by mouth 2 (two) times daily. 12/31/15   Jackie Dodrill, MD  risperiDONE (RISPERDAL M-TABS) 3 MG disintegrating tablet Take 1 tablet (3 mg total) by mouth 2 (two) times daily. 11/23/14   Jackie Prows, MD  risperiDONE microspheres (RISPERDAL CONSTA) 50 MG injection Inject 50 mg into the muscle every 30 (thirty) days.     Historical Provider, MD  TRUVADA 200-300 MG tablet TAKE 1 TABLET BY MOUTH DAILY. 05/05/16   Jackie Munson, MD    Family History Family History  Problem Relation Age of Onset  . Diabetes Sister   . Cancer Sister     breast  . Diabetes Brother   . Hypertension Brother   . Cancer Mother     lung  . Hypertension Mother   . Heart disease Father     Social History Social History  Substance Use Topics  . Smoking status: Current Every Day Smoker    Packs/day: 1.50    Years: 35.00    Types: Cigarettes    Start date: 04/10/1974  . Smokeless tobacco: Never Used     Comment: Previously smoked 2ppd, might start patch  . Alcohol use 2.4 oz/week    3 Cans of beer, 1 Standard drinks or equivalent per week     Comment: beer occas     Allergies   Patient has no known allergies.   Review of Systems Review of Systems  Constitutional: Negative for fever.  Musculoskeletal: Positive for arthralgias and myalgias.  Neurological: Negative for numbness.     Physical Exam Updated Vital Signs BP 137/72 (BP Location: Left Arm)   Pulse 65   Temp 97.5 F (36.4 C) (Oral)   Resp 14   SpO2 97%   Physical Exam  Constitutional: She  appears well-developed and well-nourished. No distress.  HENT:  Head: Normocephalic and atraumatic.  Eyes: Conjunctivae are normal.  Cardiovascular: Normal rate.   Pulmonary/Chest: Effort normal.  Abdominal: She exhibits no distension.  Musculoskeletal:  Pain in her right anterior and medial hip with inversion and eversion not reproducible with palpation. No crepitus, non-tender. old healed right upper calf deformity. Right DP pulse intact with full sensation.   Neurological: She is alert.  Skin: Skin is warm and dry.  Psychiatric: She has a normal mood and affect.  Nursing note and vitals reviewed.    ED Treatments / Results  DIAGNOSTIC  STUDIES:  Oxygen Saturation is 100% on RA, normal by my interpretation.    COORDINATION OF CARE:  5:54 PM Pt informed of the results of her x-ray. Discussed treatment plan with pt at bedside and pt agreed to plan.   Labs (all labs ordered are listed, but only abnormal results are displayed) Labs Reviewed - No data to display  EKG  EKG Interpretation None       Radiology No results found.  Procedures Procedures (including critical care time)  Medications Ordered in ED Medications - No data to display   Initial Impression / Assessment and Plan / ED Course  I have reviewed the triage vital signs and the nursing notes.  Pertinent labs & imaging results that were available during my care of the patient were reviewed by me and considered in my medical decision making (see chart for details).     Imaging obtained this week revealing for moderate right hip arthritis.  Discussed results with pt, advised heat, rest, tylenol.  Plan f/u with pcp as planned.  Final Clinical Impressions(s) / ED Diagnoses   Final diagnoses:  Primary osteoarthritis of right hip    New Prescriptions Discharge Medication List as of 06/16/2016  6:08 PM    START taking these medications   Details  acetaminophen (TYLENOL) 325 MG tablet Take 2 tablets (650  mg total) by mouth every 6 (six) hours as needed for moderate pain., Starting Fri 06/16/2016, Print       I personally performed the services described in this documentation, which was scribed in my presence. The recorded information has been reviewed and is accurate.    Jackie Amor, PA-C 06/17/16 2149    Marily Memos, MD 06/18/16 1259

## 2016-06-16 NOTE — Telephone Encounter (Signed)
[facility] so she can receive more pull ups. ep

## 2016-06-19 ENCOUNTER — Ambulatory Visit (INDEPENDENT_AMBULATORY_CARE_PROVIDER_SITE_OTHER): Payer: Medicaid Other | Admitting: Family Medicine

## 2016-06-19 ENCOUNTER — Telehealth: Payer: Self-pay | Admitting: Internal Medicine

## 2016-06-19 ENCOUNTER — Encounter: Payer: Self-pay | Admitting: Family Medicine

## 2016-06-19 VITALS — BP 122/89 | HR 93 | Temp 98.1°F | Ht 59.0 in | Wt 148.0 lb

## 2016-06-19 DIAGNOSIS — N3944 Nocturnal enuresis: Secondary | ICD-10-CM

## 2016-06-19 DIAGNOSIS — M25551 Pain in right hip: Secondary | ICD-10-CM | POA: Diagnosis not present

## 2016-06-19 DIAGNOSIS — R600 Localized edema: Secondary | ICD-10-CM

## 2016-06-19 DIAGNOSIS — E875 Hyperkalemia: Secondary | ICD-10-CM

## 2016-06-19 DIAGNOSIS — E119 Type 2 diabetes mellitus without complications: Secondary | ICD-10-CM | POA: Diagnosis present

## 2016-06-19 LAB — CBC
HEMATOCRIT: 41.1 % (ref 35.0–45.0)
HEMOGLOBIN: 13.6 g/dL (ref 11.7–15.5)
MCH: 28.5 pg (ref 27.0–33.0)
MCHC: 33.1 g/dL (ref 32.0–36.0)
MCV: 86.2 fL (ref 80.0–100.0)
MPV: 9.5 fL (ref 7.5–12.5)
Platelets: 269 10*3/uL (ref 140–400)
RBC: 4.77 MIL/uL (ref 3.80–5.10)
RDW: 20.7 % — ABNORMAL HIGH (ref 11.0–15.0)
WBC: 6.1 10*3/uL (ref 3.8–10.8)

## 2016-06-19 LAB — POCT GLYCOSYLATED HEMOGLOBIN (HGB A1C): Hemoglobin A1C: 5.9

## 2016-06-19 LAB — COMPREHENSIVE METABOLIC PANEL
ALT: 11 U/L (ref 6–29)
AST: 35 U/L (ref 10–35)
Albumin: 4.5 g/dL (ref 3.6–5.1)
Alkaline Phosphatase: 114 U/L (ref 33–130)
BUN: 11 mg/dL (ref 7–25)
CALCIUM: 8.5 mg/dL — AB (ref 8.6–10.4)
CHLORIDE: 92 mmol/L — AB (ref 98–110)
CO2: 23 mmol/L (ref 20–31)
Creat: 1.09 mg/dL — ABNORMAL HIGH (ref 0.50–1.05)
GLUCOSE: 72 mg/dL (ref 65–99)
Sodium: 122 mmol/L — ABNORMAL LOW (ref 135–146)
Total Bilirubin: 0.6 mg/dL (ref 0.2–1.2)
Total Protein: 7.7 g/dL (ref 6.1–8.1)

## 2016-06-19 LAB — TSH: TSH: 3.74 mIU/L

## 2016-06-19 MED ORDER — PREDNISONE 50 MG PO TABS: 50.0000 mg | ORAL_TABLET | Freq: Every day | ORAL | 0 refills | Status: DC

## 2016-06-19 NOTE — Telephone Encounter (Signed)
After Hours Emergency Line Call:   Called for critical lab value [facility] > 8.0 on CM[facility]. Sample was hemolyzed so integrity of results may be affected. Noted that patient did have elevated K 6.0 on 3/1 (do not see any documentation that patient was contacted about these results).   Was able to contact patient and discuss these results with her and brother. Explained the potential risks associated with hyperkalemia, including serious cardiac effects. Instructed patient that she would need to come to ED to have K level re-checked and that this was potentially an emergency. Brother stated a couple times that Kilie "wasn't coming to no ER tonight" saying that patient did not drive. Again, relayed importance of ED visit for lab work. Brother again declined. Instructed them to come to ED asap or to go to [facility]/urgent care in the AM if they declined to visit the ER tonight. Will forward this message to PCP.   Marcy Sirenatherine Wallace, D.O. 06/19/2016, 10:36 PM PGY-2, [facility]

## 2016-06-19 NOTE — Patient Instructions (Addendum)
[facility] to schedule follow up visit [phone]  Referring to Delbert HarnessMurphy Wainer hip specialist  For swelling: -labs today -getting ultrasounds of legs -getting ultrasound of heart -back off salty foods  Follow up with me in 1 month, sooner if anything worsens  Be well, Dr. Pollie MeyerMcIntyre

## 2016-06-19 NOTE — Progress Notes (Signed)
Date of Visit: 06/19/2016   HPI:  Patient presents with her sister [name] to discuss a couple of issues:  1. Diabetes - compliant with metformin 500mg  twice daily. Due for eye exam, needs [facility] order to schedule.  2. Hip pain - has pain in R hip. No prior injuries, other than that 1 month ago felt a pop in her hip. Has tried tylenol without any relief. Able to walk and bear weight on the hip. Had xray done by ID doctor, which showed moderate osteoarthritis of R hip joint. Agreeable with seeing hip specialist. Wants to try something else for pain as tylenol isn't helping. Sister is concerned about use of controlled substances in patient.   3. Swelling of legs - legs have been swollen for several days. Has had lots of salty and sweet foods lately. No chest pain, shortness of breath, or fevers. Using albuterol as needed, only about 1-2 times per week.   4. Urinary incontinence - chronic, needs refill of briefs for incontinence. Sister will call [facility] and have forms faxed to us.  ROS: See HPI.  PMFSH: history of hiv, schizophrenia, hep B, asthma, tobacco abuse, diabetes, GERD  PHYSICAL EXAM: BP 122/89 (BP Location: Left Arm, Patient Position: Sitting, Cuff Size: Normal)   Pulse 93   Temp 98.1 F (36.7 C) (Oral)   Ht 4\' 11"  (1.499 m)   Wt 148 lb (67.1 kg)   SpO2 94%   BMI 29.89 kg/m  Gen: no acute distress, pleasant, cooperative HEENT: normocephalic, atraumatic, mmm Heart:  Regular rate and rhythm Lungs: clear to auscultation bilaterally, normal work of breathing  Neuro: alert, grossly nonfocal, speech normal Ext: 3+ bilateral lower extremity edema. Neg homans bilaterally. Calves nontender to palpation. No redness or warmth. Pain with internal  & external rotation of R hip. Normal gait.  ASSESSMENT/PLAN:  Health maintenance:  -gave phone # to schedule diabetic eye exam  Diabetes Well controlled with A1c of 5.9. Continue metformin. Advised to  schedule eye exam. Otherwise UTD on diabetes health maintenance.  Lower extremity edema Differential diagnosis includes increased salt intake, CHF, anasarca state, AKI, anemia, bilateral DVT. Given relatively acute onset with glossy appearance of legs, I do think it is prudent to rule out DVT. Will obtain doppler ultrasuond. Also update echo. Check labs: BNP, CBC, CMET, TSH.  Urinary incontinence, nocturnal enuresis Will complete forms for urinary briefs when they arrive  R hip pain Agree with sister that use of controlled substances in this patient with schizophrenia and difficulty controlling use of cigarettes & ETOH is risky. Will refer to orthopedics. May need MRI vs could consider hip steroid injections. For pain control in the interim, will rx prednisone 50mg  daily for 7 days.  FOLLOW UP: Follow up in 1 mo for above issues Referring to ortho  [name], MD [facility]

## 2016-06-20 ENCOUNTER — Other Ambulatory Visit: Payer: Medicaid Other

## 2016-06-20 DIAGNOSIS — E871 Hypo-osmolality and hyponatremia: Secondary | ICD-10-CM

## 2016-06-20 LAB — BASIC METABOLIC PANEL
Anion gap: 12 (ref 5–15)
BUN: <5 mg/dL — ABNORMAL LOW (ref 6–20)
CALCIUM: 9.7 mg/dL (ref 8.9–10.3)
CO2: 25 mmol/L (ref 22–32)
Chloride: 94 mmol/L — ABNORMAL LOW (ref 101–111)
Creatinine, Ser: 0.92 mg/dL (ref 0.44–1.00)
GFR calc non Af Amer: >60 mL/min (ref >60)
Glucose, Bld: 110 mg/dL — ABNORMAL HIGH (ref 65–99)
Potassium: 4.6 mmol/L (ref 3.5–5.1)
SODIUM: 131 mmol/L — AB (ref 135–145)

## 2016-06-20 LAB — BRAIN NATRIURETIC PEPTIDE: BRAIN NATRIURETIC PEPTIDE: 65.6 pg/mL (ref <100)

## 2016-06-20 NOTE — Telephone Encounter (Signed)
I called patient myself this morning, she and her brother stated they could not come in until Thursday for lab redraw.  I then called her sister [name] (who always accompanies her to visits). Explained to [place] Puerta[place]ridget the urgency of repeating this lab, that K of >8 could be life threatening, and [name] said they would bring Aleathea in today. Lab appointment scheduled at 11:30am.   Will have BMET sent as stat.   Latrelle DodrillBrittany J Baileigh Modisette, MD

## 2016-06-20 NOTE — Addendum Note (Signed)
Addended by: Latrelle DodrillMCINTYRE, Jolan Mealor J on: 06/20/2016 10:27 AM   Modules accepted: Orders

## 2016-06-21 DIAGNOSIS — R600 Localized edema: Secondary | ICD-10-CM | POA: Insufficient documentation

## 2016-06-21 NOTE — Assessment & Plan Note (Signed)
Well controlled with A1c of 5.9. Continue metformin. Advised to schedule eye exam. Otherwise UTD on diabetes health maintenance.

## 2016-06-21 NOTE — Assessment & Plan Note (Signed)
Will complete forms for urinary briefs when they arrive

## 2016-06-21 NOTE — Assessment & Plan Note (Signed)
Differential diagnosis includes increased salt intake, CHF, anasarca state, AKI, anemia, bilateral DVT. Given relatively acute onset with glossy appearance of legs, I do think it is prudent to rule out DVT. Will obtain doppler ultrasuond. Also update echo. Check labs: BNP, CBC, CMET, TSH.

## 2016-06-23 ENCOUNTER — Telehealth: Payer: Self-pay | Admitting: Family Medicine

## 2016-06-23 ENCOUNTER — Encounter: Payer: Self-pay | Admitting: Family Medicine

## 2016-06-23 NOTE — Telephone Encounter (Signed)
Red team,  I noticed that this patient's lower extremity doppler is scheduled for next Friday. This was ordered as stat on Tuesday.  Can you try to get this moved up?  Thanks!! Latrelle DodrillBrittany J Breandan People, MD

## 2016-06-26 NOTE — Telephone Encounter (Signed)
Spoke with patient and brother an explained that MD wanted patient to have test earlier as she was concerned about possible blood clot , they are adamant that she will not be able to make an any appointment earlier because they rely on sister for transportation and she has to work. Asked that they have sister call back office if they are able to have this test sooner. FYI to MD.

## 2016-06-26 NOTE — Telephone Encounter (Signed)
Ok. Thanks. Latrelle DodrillBrittany J Yusra Ravert, MD

## 2016-06-30 ENCOUNTER — Ambulatory Visit (HOSPITAL_BASED_OUTPATIENT_CLINIC_OR_DEPARTMENT_OTHER)
Admission: RE | Admit: 2016-06-30 | Discharge: 2016-06-30 | Disposition: A | Discharge status: Home / Self Care | Payer: Medicaid Other | Source: Ambulatory Visit | Attending: Family Medicine | Admitting: Family Medicine

## 2016-06-30 ENCOUNTER — Ambulatory Visit (HOSPITAL_COMMUNITY)
Admission: RE | Admit: 2016-06-30 | Discharge: 2016-06-30 | Disposition: A | Discharge status: Home / Self Care | Payer: Medicaid Other | Source: Ambulatory Visit | Attending: Family Medicine | Admitting: Family Medicine

## 2016-06-30 DIAGNOSIS — R600 Localized edema: Secondary | ICD-10-CM

## 2016-06-30 DIAGNOSIS — I272 Pulmonary hypertension, unspecified: Secondary | ICD-10-CM | POA: Insufficient documentation

## 2016-06-30 DIAGNOSIS — I080 Rheumatic disorders of both mitral and aortic valves: Secondary | ICD-10-CM | POA: Diagnosis not present

## 2016-06-30 LAB — ECHOCARDIOGRAM COMPLETE
AV Area VTI index: 0.76 cm2/m2
AV Area mean vel: 1.22 cm2
AV Peak grad: 45 mmHg
AV area mean vel ind: 0.75 cm2/m2
AV vel: 1.23
AVA: 1.23 cm2
AVAREAVTI: 1.38 cm2
AVCELMEANRAT: 0.48
AVG: 26 mmHg
AVPKVEL: 336 cm/s
Ao pk vel: 0.54 m/s
CHL CUP AV PEAK INDEX: 0.85
CHL CUP AV VALUE AREA INDEX: 0.76
CHL CUP MV DEC (S): 264
CHL CUP RV SYS PRESS: 42 mmHg
CHL CUP TV REG PEAK VELOCITY: 314 cm/s
DOP CAL AO MEAN VELOCITY: 240 cm/s
E/e' ratio: 15.96
EWDT: 264 ms
FS: 30 % (ref 28–44)
IVS/LV PW RATIO, ED: 0.76
LA diam end sys: 30 mm
LA diam index: 1.85 cm/m2
LA vol A4C: 49.2 ml
LA vol index: 26.5 mL/m2
LASIZE: 30 mm
LAVOL: 43 mL
LV E/e' medial: 15.96
LV TDI E'LATERAL: 7.83
LV e' LATERAL: 7.83 cm/s
LVEEAVG: 15.96
LVOT VTI: 37.9 cm
LVOT area: 2.54 cm2
LVOT peak VTI: 0.48 cm
LVOT peak grad rest: 13 mmHg
LVOT peak vel: 182 cm/s
LVOTD: 18 mm
LVOTSV: 96 mL
Lateral S' vel: 13.1 cm/s
MV Peak grad: 6 mmHg
MV pk A vel: 151 m/s
MV pk E vel: 125 m/s
P 1/2 time: 385 ms
PW: 9.17 mm — AB (ref 0.6–1.1)
TAPSE: 26.5 mm
TDI e' medial: 7.07
TR max vel: 314 cm/s
VTI: 78.5 cm

## 2016-06-30 NOTE — Progress Notes (Signed)
  Echocardiogram 2D Echocardiogram has been performed.  [name], [name] 06/30/2016, 9:09 AM

## 2016-06-30 NOTE — Progress Notes (Signed)
*  Preliminary Results* Bilateral lower extremity venous duplex completed. Bilateral lower extremities are negative for deep vein thrombosis. There is no evidence of Baker's cyst bilaterally.  06/30/2016 9:30 AM Gertie FeyMichelle Taj Arteaga, BS, RVT, RDCS, RDMS

## 2016-07-03 ENCOUNTER — Other Ambulatory Visit (HOSPITAL_COMMUNITY): Payer: Self-pay

## 2016-07-05 ENCOUNTER — Encounter: Payer: Self-pay | Admitting: Podiatry

## 2016-07-05 ENCOUNTER — Ambulatory Visit (INDEPENDENT_AMBULATORY_CARE_PROVIDER_SITE_OTHER): Payer: Medicaid Other | Admitting: Podiatry

## 2016-07-05 DIAGNOSIS — L851 Acquired keratosis [keratoderma] palmaris et plantaris: Secondary | ICD-10-CM

## 2016-07-05 DIAGNOSIS — L84 Corns and callosities: Secondary | ICD-10-CM

## 2016-07-05 DIAGNOSIS — M79671 Pain in right foot: Secondary | ICD-10-CM

## 2016-07-05 DIAGNOSIS — M79672 Pain in left foot: Secondary | ICD-10-CM

## 2016-07-08 NOTE — Progress Notes (Signed)
   Subjective: Patient presents to the office today for chief complaint of painful callus lesions of the feet. Patient states that the pain is ongoing and is affecting their ability to ambulate without pain. Patient presents today for further treatment and evaluation.  Objective:  Physical Exam General: Alert and oriented x3 in no acute distress  Dermatology: Hyperkeratotic lesion present on the plantar aspect of the fifth MPJ right foot. Pain on palpation with a central nucleated core noted.  Skin is warm, dry and supple bilateral lower extremities. Negative for open lesions or macerations.  Vascular: Palpable pedal pulses bilaterally. No edema or erythema noted. Capillary refill within normal limits.  Neurological: Epicritic and protective threshold grossly intact bilaterally.   Musculoskeletal Exam: Pain on palpation at the keratotic lesion noted. Range of motion within normal limits bilateral. Muscle strength 5/5 in all groups bilateral.  Assessment: #1 porokeratosis sub-fifth MPJ right foot   Plan of Care:  #1 Patient evaluated #2 Excisional debridement of  keratoic lesion using a chisel blade was performed without incident.  #3  Patient is to return to the clinic PRN.   Felecia Shelling, DPM [facility]  Dr. Felecia Shelling, DPM    [address]                                        [place], [place] [address]                Office [phone]  Fax [phone]

## 2016-07-25 ENCOUNTER — Encounter: Payer: Self-pay | Admitting: Family Medicine

## 2016-07-25 ENCOUNTER — Ambulatory Visit (INDEPENDENT_AMBULATORY_CARE_PROVIDER_SITE_OTHER): Payer: Medicaid Other | Admitting: Family Medicine

## 2016-07-25 VITALS — BP 122/84 | HR 89 | Temp 98.3°F | Ht 59.0 in | Wt 148.0 lb

## 2016-07-25 DIAGNOSIS — J45909 Unspecified asthma, uncomplicated: Secondary | ICD-10-CM | POA: Diagnosis not present

## 2016-07-25 DIAGNOSIS — R931 Abnormal findings on diagnostic imaging of heart and coronary circulation: Secondary | ICD-10-CM | POA: Diagnosis not present

## 2016-07-25 DIAGNOSIS — Z01818 Encounter for other preprocedural examination: Secondary | ICD-10-CM | POA: Diagnosis not present

## 2016-07-25 DIAGNOSIS — R600 Localized edema: Secondary | ICD-10-CM | POA: Diagnosis not present

## 2016-07-25 MED ORDER — ALBUTEROL SULFATE (2.5 MG/3ML) 0.083% IN NEBU: INHALATION_SOLUTION | RESPIRATORY_TRACT | 5 refills | Status: DC

## 2016-07-25 MED ORDER — ALBUTEROL SULFATE HFA 108 (90 BASE) MCG/ACT IN AERS: 2.0000 | INHALATION_SPRAY | Freq: Four times a day (QID) | RESPIRATORY_TRACT | 2 refills | Status: DC | PRN

## 2016-07-25 MED ORDER — MOMETASONE FURO-FORMOTEROL FUM 200-5 MCG/ACT IN AERO: INHALATION_SPRAY | RESPIRATORY_TRACT | 5 refills | Status: AC

## 2016-07-25 MED ORDER — FUROSEMIDE 20 MG PO TABS: 20.0000 mg | ORAL_TABLET | Freq: Every day | ORAL | 0 refills | Status: DC

## 2016-07-25 NOTE — Patient Instructions (Signed)
Regarding cardiac clearance for surgery - referring you to see a heart doctor to assess this  For breathing: - refilled albuterol inhaler, albuterol nebulizer solution, dulera  For swelling: - start lasix  daily  Follow up here next week with me or another doctor to see how swelling & breathing are doing on these medications. Will need to recheck labs at that time as well.  See me in May for a visit.  Be well, Dr. Pollie Meyer

## 2016-07-25 NOTE — Progress Notes (Signed)
Date of Visit: 07/25/2016   HPI:  [name] presents with her sister Clarisse Gouge, to discuss clearance for hip surgery.  Surgical clearance - Was seen by Dr. Cleophas Dunker at [facility] orthopedics, and underwent apparent imaging guided hip injection without any relief. The plan is now for likely hip replacement, and she is being set up to see a Careers adviser. Was given a form to complete for cardiac and medical clearance for surgery. Has been prescribed a limited quantity of narcotic medication by ortho office. Family is being cautious about supervising her use of the narcotic medications.   Asthma/COPD - has been out of inhalers (albuterol and dulera) and nebulizer medication (albuterol) for some time. Breathing not doing well while out of medications. Continues to smoke regularly.   Leg swelling - persists. After last visit had dopplers of legs which were neg for DVT. 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 Does not take any lasix.   ROS: See HPI.  PMFSH: history of hiv, schizophrenia, hep B, asthma, tobacco abuse, diabetes, GERD  PHYSICAL EXAM: BP 122/84 (BP Location: Right Arm, Patient Position: Sitting, Cuff Size: Normal)   Pulse 89   Temp 98.3 F (36.8 C) (Oral)   Ht  (1.499 m)   Wt 148 lb (67.1 kg)   SpO2 95%   BMI 29.89 kg/m  Gen: no acute distress, pleasant, cooperative HEENT: normocephalic, atraumatic  Heart: regular rate and rhythm, no murmurs heard Lungs: mild expiratory wheezing. Speaks in full sentences without distress. No crackles. Neuro: grossly nonfocal, speech at baseline Ext: 2+ edema bilateral lower extremities   ASSESSMENT/PLAN:  Asthma Uncontrolled as out of controller medication and SABA. Will refill these. No need for systemic steroids at this time. Follow up next week to eval for improvement. Encouraged  smoking cessation.  Abnormal echocardiogram Reviewed echo results with patient and her sister today. As she is needing cardiac clearance for likely surgery, with an abnormal echo, needs to be seen by cardiology. I have previously attempted to get her to see a cardiologist for prior abnormal echo, but she refused to go or be scheduled. She is now willing to go. I am glad we can now get her in.  Lower extremity edema With continued lower extremity edema and diastolic dysfunction on echo, will start lasix  daily. Follow up next week to monitor for improvement. Will also need BMET checked due to start of lasix.  FOLLOW UP: Follow up next week for above issues Referring to cardiology  [place] J. Pollie Meyer, MD [facility]

## 2016-07-27 NOTE — Assessment & Plan Note (Addendum)
Uncontrolled as out of controller medication and SABA. Will refill these. No need for systemic steroids at this time. Follow up next week to eval for improvement. Encouraged smoking cessation.

## 2016-07-27 NOTE — Assessment & Plan Note (Signed)
With continued lower extremity edema and diastolic dysfunction on echo, will start lasix  daily. Follow up next week to monitor for improvement. Will also need BMET checked due to start of lasix.

## 2016-07-27 NOTE — Assessment & Plan Note (Signed)
Reviewed echo results with patient and her sister today. As she is needing cardiac clearance for likely surgery, with an abnormal echo, needs to be seen by cardiology. I have previously attempted to get her to see a cardiologist for prior abnormal echo, but she refused to go or be scheduled. She is now willing to go. I am glad we can now get her in.

## 2016-08-01 ENCOUNTER — Encounter: Payer: Self-pay | Admitting: Cardiovascular Disease

## 2016-08-01 ENCOUNTER — Ambulatory Visit (INDEPENDENT_AMBULATORY_CARE_PROVIDER_SITE_OTHER): Payer: Medicaid Other | Admitting: Cardiovascular Disease

## 2016-08-01 VITALS — BP 137/84 | HR 70 | Ht 59.0 in | Wt 150.2 lb

## 2016-08-01 DIAGNOSIS — I5033 Acute on chronic diastolic (congestive) heart failure: Secondary | ICD-10-CM | POA: Diagnosis not present

## 2016-08-01 DIAGNOSIS — I059 Rheumatic mitral valve disease, unspecified: Secondary | ICD-10-CM | POA: Diagnosis not present

## 2016-08-01 DIAGNOSIS — Q231 Congenital insufficiency of aortic valve: Secondary | ICD-10-CM

## 2016-08-01 DIAGNOSIS — I350 Nonrheumatic aortic (valve) stenosis: Secondary | ICD-10-CM

## 2016-08-01 DIAGNOSIS — E7800 Pure hypercholesterolemia, unspecified: Secondary | ICD-10-CM

## 2016-08-01 DIAGNOSIS — Z720 Tobacco use: Secondary | ICD-10-CM

## 2016-08-01 MED ORDER — FUROSEMIDE 40 MG PO TABS: 40.0000 mg | ORAL_TABLET | Freq: Two times a day (BID) | ORAL | 5 refills | Status: DC

## 2016-08-01 NOTE — Progress Notes (Signed)
Cardiology Office Note   Date:  08/02/2016   ID:  [name], DOB 10-28-1960, MRN [id number]  PCP:  Levert Feinstein, MD  Cardiologist:   Chilton Si, MD   Chief Complaint  Patient presents with  . New Patient (Initial Visit)  . Shortness of Breath    constantly.  . Leg Pain    cramping in legs  . Edema    face, thighs, legs. Lasix has not improved edema.       History of Present Illness: [name] is a 56 y.o. female with asthma, diabetes, HIV, asthma, schizophrenia, and tobacco abuse who presents for an evaluation of lower extremity edema.  [name] saw Dr. Levert Feinstein 06/2016 and reported lower extremity edema.  She was referred for lower extremity Dopplers 06/30/16 that were negative for DVT.  She also had an echo the same day that revealed LVEF 60--65% with grade 2 diastolic dysfunction.  Her aortic valve was functionally bicuspid and there was moderate aortic stenosis was a mean gradient of 26 mmHg and moderate aortic regurgitation.  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 [name] reports that the swelling started approximately 2 months ago. Her lipids are very tight and it makes it hard for her to walk. She endorses orthopnea and notes that she sometimes wheezes when she tries to lay down. She denies chest pain, lightheadedness, or dizziness. She has gained 12 pounds in the last 2 months. She notes that she drinks constantly and has several liters of fluids daily. She reports that her father had a heart attack at age 51.  [name] has been smoking one and a half packs of cigarettes daily for the last 40 years. She's tried to quit smoking using patches but has been unsuccessful.  Past Medical History:  Diagnosis Date  . AIDS (HCC) 12-10-2007  . Cataract   . Chronic asthmatic bronchitis (HCC)   . Diabetes mellitus without complication (HCC)    type 2 with out complication  . GERD (gastroesophageal reflux  disease)   . Hepatitis B infection    followed by ID  . Herpes simplex   . History of cholecystectomy   . Schizophrenia (HCC)   . Seizures (HCC)   . Tobacco use   . Tuberculin test reaction 11-2008 [facility]     Past Surgical History:  Procedure Laterality Date  . CHOLECYSTECTOMY    . LEG SURGERY Right    s/p accident; "steel fell on the back of my leg"  . TONSILLECTOMY       Current Outpatient Prescriptions  Medication Sig Dispense Refill  . acetaminophen (TYLENOL) 325 MG tablet Take 2 tablets (650 mg total) by mouth every 6 (six) hours as needed for moderate pain. 20 tablet 0  . albuterol (PROVENTIL HFA) 108 (90 Base) MCG/ACT inhaler Inhale 2 puffs into the lungs every 6 (six) hours as needed for wheezing or shortness of breath. 18 g 2  . albuterol (PROVENTIL) (2.5 MG/3ML) 0.083% nebulizer solution INHALE CONTENTS OF 1 VIAL USING NEBULIZER EVERY 6 HOURS AS NEEDED FOR WHEEZING 75 mL 5  . atorvastatin (LIPITOR) 10 MG tablet Take 1 tablet (10 mg total) by mouth daily. 90 tablet 3  . divalproex (DEPAKOTE SPRINKLE) 125 MG capsule TAKE 4 CAPSULES (500 MG TOTAL) BY MOUTH 2 (TWO) TIMES DAILY. 240 capsule 0  . furosemide (LASIX) 40 MG tablet Take 1 tablet (40 mg total) by mouth 2 (two) times daily. 60  tablet 5  . metFORMIN (GLUCOPHAGE) 500 MG tablet TAKE 1 TABLET BY MOUTH 2 TIMES DAILY WITH A MEAL 180 tablet 3  . mometasone-formoterol (DULERA) 200-5 MCG/ACT AERO INHALE 2 PUFFS INTO THE LUNGS TWICE DAILY 13 g 5  . Nebulizers (CLEVER CHOICE NEBULIZER) MISC 1 application by Does not apply route every 6 (six) hours as needed. Please provide 1 nebulizer machine to use via inhalation Q6 hours prn SOB 1 each 0  . NORVIR 100 MG TABS tablet TAKE 1 TABLET BY MOUTH ONCE DAILY WITH PREZISTA 30 tablet 6  . pantoprazole (PROTONIX) 40 MG tablet Take 1 tablet (40 mg total) by mouth daily. 90 tablet 3  . PREZISTA 800 MG tablet TAKE 1 TABLET BY MOUTH ONCE DAILY WITH BREAKFAST.  TAKE WITH NORVIR. 30 tablet 6  .  ranitidine (ZANTAC) 150 MG tablet Take 1 tablet (150 mg total) by mouth 2 (two) times daily. 180 tablet 3  . risperiDONE (RISPERDAL M-TABS) 3 MG disintegrating tablet Take 1 tablet (3 mg total) by mouth 2 (two) times daily. 60 tablet 0  . risperiDONE microspheres (RISPERDAL CONSTA) 50 MG injection Inject 50 mg into the muscle every 30 (thirty) days.     . TRUVADA 200-300 MG tablet TAKE 1 TABLET BY MOUTH DAILY. 30 tablet 6   No current facility-administered medications for this visit.     Allergies:   Patient has no known allergies.    Social History:  The patient  reports that she has been smoking Cigarettes.  She started smoking about 42 years ago. She has a 52.50 pack-year smoking history. She has never used smokeless tobacco. She reports that she drinks about 2.4 oz of alcohol per week . She reports that she does not use drugs.   Family History:  The patient's family history includes Cancer in her mother and sister; Diabetes in her brother; Heart attack in her father; Heart disease in her father; Hypertension in her brother.    ROS:  Please see the history of present illness.   Otherwise, review of systems are positive for depression.   All other systems are reviewed and negative.    PHYSICAL EXAM: VS:  BP 137/84   Pulse 70   Ht  (1.499 m)   Wt 68.1 kg (150 lb 3.2 oz)   BMI 30.34 kg/m  , BMI Body mass index is 30.34 kg/m. GENERAL:  Well appearing.  Tearful HEENT:  Pupils equal round and reactive, fundi not visualized, oral mucosa unremarkable NECK:  JVP 2 cm above the clavicle at 45 degrees, waveform within normal limits, carotid upstroke brisk and symmetric, no bruits, no thyromegaly LYMPHATICS:  No cervical adenopathy LUNGS:  Clear to auscultation bilaterally HEART:  RRR.  PMI not displaced or sustained,S1 and S2 within normal limits, no S3, no S4, no clicks, no rubs,III/VI mid-peaking systolic murmur at the LUSB ABD:  Flat, positive bowel sounds normal in frequency in  pitch, no bruits, no rebound, no guarding, no midline pulsatile mass, no hepatomegaly, no splenomegaly EXT:  2 plus pulses throughout, 2+ pitting edema to the knee bilaterally, no cyanosis no clubbing SKIN:  No rashes no nodules NEURO:  Cranial nerves II through XII grossly intact, motor grossly intact throughout PSYCH:  Cognitively intact, oriented to person place and time    EKG:  EKG is ordered today. The ekg ordered today demonstrates sinus rhtyhm rate 70 bpm.    Echo 06/30/16: Study Conclusions  - Left ventricle: The cavity size was normal. Systolic function was  normal. The estimated ejection fraction was in the range of 60%   to 65%. Wall motion was normal; there were no regional wall   motion abnormalities. Features are consistent with a pseudonormal   left ventricular filling pattern, with concomitant abnormal   relaxation and increased filling pressure (grade 2 diastolic   dysfunction). Doppler parameters are consistent with high   ventricular filling pressure. - Aortic valve: Functionally bicuspid; severely calcified leaflets.   Valve mobility was restricted. There was moderate stenosis. There   was moderate regurgitation. Mean gradient (S): 26 mm Hg. Valve   area (VTI): 1.23 cm^2. Valve area (Vmax): 1.38 cm^2. Valve area   (Vmean): 1.22 cm^2. Regurgitation pressure half-time: 385 ms. - Mitral valve: Mild focal calcification of the anterior leaflet   (medial segment(s)), with mild involvement of anterior leaflet   chords, consistent with rheumatic disease. There was mild   regurgitation. - Pulmonic valve: There was trivial regurgitation. - Pulmonary arteries: PA peak pressure: 47 mm Hg (S).  Impressions:  - The right ventricular systolic pressure was increased consistent   with moderate pulmonary hypertension.  Recent Labs: 06/19/2016: ALT 11; Brain Natriuretic Peptide 65.6; Hemoglobin 13.6; Platelets 269; TSH 3.74 06/20/2016: BUN <5; Creatinine, Ser 0.92;  Potassium 4.6; Sodium 131    Lipid Panel    Component Value Date/Time   CHOL 154 06/08/2016 1110   TRIG 96 06/08/2016 1110   HDL 58 06/08/2016 1110   CHOLHDL 2.7 06/08/2016 1110   VLDL 19 06/08/2016 1110   LDLCALC 77 06/08/2016 1110      Wt Readings from Last 3 Encounters:  08/01/16 68.1 kg (150 lb 3.2 oz)  07/25/16 67.1 kg (148 lb)  06/19/16 67.1 kg (148 lb)      ASSESSMENT AND PLAN:  # Acute on chronic diastolic heart failure:  Ms. Scheurich is clearly volume overloaded.  Increase Lasix to 40 mg twice daily. I've asked her to start wearing compression stockings and to limit her fluid intake to 1-1/2 L. She is also to limit her salt intake to 2 g.  # Moderate aortic stenosis: # Bicuspid aortic valve: # Rheumatic mitral stenosis: Lasix as above.   # Hypertension: Blood pressure is above given that we are increasing her Lasix we will not make any other changes at this time.goal.    # Tobacco abuse: Ms. Laverdiere was encouraged to quit smoking.  We discussed using number patches. We discussed smoking cessation for 5 minutes.   # Hyperlipidemia:  Continue atorvastatin.   Current medicines are reviewed at length with the patient today.  The patient does not have concerns regarding medicines.  The following changes have been made:  Increase lasix to 40 mg bid  Labs/ tests ordered today include:  No orders of the defined types were placed in this encounter.    Disposition:   FU with Militza Devery C. Duke Salvia, MD, [facility] in 2 weeks.    This note was written with the assistance of speech recognition software.  Please excuse any transcriptional errors.  Signed, Jex Strausbaugh C. Duke Salvia, MD, [facility]  08/02/2016 5:30 PM    [facility]

## 2016-08-01 NOTE — Progress Notes (Signed)
   Subjective:    Patient ID: [name], female    DOB: 1960/07/08, 56 y.o.   MRN: [id number]  HPI 56 y/o female presents for follow up of preoperative clearance for total hip replacement. Seen on 07/25/16 by Dr. Pollie Meyer. Instructed to follow up for uncontrolled Asthma.   PMH: Asthma, Diabetes, HIV, Schizophrenia, Tobacco Use, Echo 2017 - grade 1 Diastolic Dysfunction and possible bicuspid aortic valve with restricted mobility.   Asthma/COPD with Tobacco Abuse Prescribed Albuterol at last visit, uses nightly (nebulizer), uses MDI 2-3 times per day, uses Dulera daily, breathing improved since getting back on regular treatment Smoker 1.5 PPD, previously tried patch (no improvement), also tried gum (still smoked), interested in other options for smoking cessation.   LE edema/Diastolic CHF Started on lasix at last visit. Evaluated by Cardiology on 4/24. Thought to be volume overloaded. Lasix increased to 40 mg BID. Goes back May 10. Breathing improved with lasix and albuterol. However drinks constantly (7-8 regular and diet soda), does not adhere to fluid restriction. Leg swelling mildly improved however still present.    Review of Systems See Above    Objective:   Physical Exam BP (!) 110/52   Pulse 80   Temp 97.4 F (36.3 C) (Oral)   Ht  (1.499 m)   Wt 145 lb 9.6 oz (66 kg)   SpO2 93%   BMI 29.41 kg/m   Gen: pleasant female, NAD Cardiac: RRR, S1 and S2 present, no murmur Resp: CTAB, normal effort Ext: 2+ edema to knee bilaterally  Weight down to 145 (150 three days ago).  06/2015 echo showed grade 1 diastolic dysfunction, normal systolic function. Possible bicuspid aortic valve with restricted mobility, moderate regurgitation and stenosis. 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  RCRI - score of 1 (if count diastolic CHF as point for heart failure)  BMP 06/20/16 Na low at 131, Chloride low at 94, Glucose mildly  elevated at 110  TSH 06/19/16 wnl CBC on 06/19/16 wnl LFT's on 06/19/16 wnl BNP 06/19/16 wnl  PFT's 2016 showed restrictive pattern  CT Chest 2016 IMPRESSION: No evidence for pulmonary embolism.  Scattered bilateral predominantly upper lobe ground-glass pulmonary opacities which are nonspecific however may be secondary to atypical infectious process, atelectasis or mild interstitial edema.  Scattered pulmonary granulomas.     Assessment & Plan:  Asthma Improved from visit on 4/19. -continue daily Dulera -discussed smoking cessation -use albuterol prn (goal of less than once per day)  Lower extremity edema Likely due to Diastolic CHF and venous insufficiency. Improved from last visit. -continue Lasix 40 mg BID -check BMP to monitor electrolytes and renal function  Tobacco use disorder Still smoking 1.5 PPD. Interested in smoking cessation. -prescribed nicotine replacement lozenges  Shortness of breath Multifactorial form tobacco abuse, Asthma (however FPT's in 2016 showed restrictive pattern), and Diastolic CHF. Improved with diuresis and Dulera/albuterol. -continue current treatment -patient no longer follows with Pulmonology but consider repeat referral if symptoms persist -CT in 2016 showed ground glass opacities that were nonspecific, given restrictive pattern on PFT's previous would consider repeat CT chest if symptoms worsen

## 2016-08-01 NOTE — Patient Instructions (Addendum)
Medication Instructions:  START FUROSEMIDE 40 MG TWICE A DAY   Labwork: NONE  Testing/Procedures: NONE  Follow-Up: Your physician recommends that you schedule a follow-up appointment in: 2 WEEKS   Any Other Special Instructions Will Be Listed Below (If Applicable). START WEARING COMPRESSION STOCKINGS  LIMIT YOUR FLUID INTAKE TO 1 AND 1/2 LITERS (50 -51 OUNCES A DAY)  LIMIT YOUR SALT INTAKE TO 2 GRAMS A DAY   Fluid Restriction Some health conditions may require you to restrict your fluid intake. This means that you need to limit the amount of fluid you drink each day. When you have a fluid restriction, you must carefully measure and keep track of the amount of fluid you drink. Your health care provider will identify the specific amount of fluid you are allowed each day. This amount may depend on several things, such as:  The amount of urine you produce in a day.  How much fluid you are keeping (retaining) in your body.  Your blood pressure. What is my plan? Your health care provider recommends that you limit your fluid intake to ____50-51 OUNCES______ per day. What counts toward my fluid intake? Your fluid intake includes all liquids that you drink, as well as any foods that become liquid at room temperature. The following are examples of some fluids that you will have to restrict:  Tea, coffee, soda, lemonade, milk, water, juice, sport drinks, and nutritional supplement beverages.  Alcoholic beverages.  Cream.  Gravy.  Ice cubes.  Soup and broth. The following are examples of foods that become liquid at room temperature. These foods will also count toward your fluid intake.  Ice cream and ice milk.  Frozen yogurt and sherbet.  Frozen ice pops.  Flavored gelatin. How do I keep track of my fluid intake? Each morning, fill a jug with the amount of water that equals the amount of fluid you are allowed for the day. You can use this water as a guideline for fluid  allowance. Each time you take in any form of fluid, including ice cubes and foods that become liquid at room temperature, pour an equal amount of water out of the container. This helps you to see how much fluid you are taking in. It also helps you to see how much of your fluid intake is left for the rest of the day. The following conversions may also be helpful in measuring your fluid intake:  1 cup equals 8 oz (240 mL).   cup equals 6 oz (180 mL).  ? cup equals 5? oz (160 mL).   cup equals 4 oz (120 mL).  ? cup equals 2? oz (80 mL).   cup equals 2 oz (60 mL).  2 Tbsp equals 1 oz (30 mL). What home care instructions should I follow while restricting fluids?  Make sure that you stay within the recommended limit each day. Always measure and keep track of your fluids, as well as any foods that turn liquid at room temperature.  Use small cups and glasses and learn to sip fluids slowly.  Add a slice of fresh lemon or lemon juice to water or ice. This helps to satisfy your thirst.  Freeze fruit juice or water in an ice cube tray. Use this as part of your fluid allowance. These cubes are useful for quenching your thirst. Measure the amount of liquid in each ice cube prior to freezing so you can subtract this amount from your day's allowance when you consume each frozen cube.  Try frozen fruits between meals, such as grapes or strawberries.  Swallow your pills along with meals or soft foods, such as applesauce or mashed potatoes. This helps you to save your fluid allowance for something that you enjoy.  Weigh yourself every day. Keeping track of your daily weight can help you and your health care provider to notice as soon as possible if you are retaining too much fluid in your body.  Weigh yourself every morning after you urinate but before you eat breakfast.  Wear the same amount of clothing each time you weigh yourself.  Write down your daily weight. Give this weight record to your  health care provider. If your weight is going up, you may be retaining too much fluid. Every 2 cups (480 mL) of fluid retained in the body becomes an extra 1 lb (0.45 kg) of body weight.  Avoid salty foods. These foods make you thirsty and make fluid control more difficult.  Brush your teeth often or rinse your mouth with mouthwash to help your dry mouth. Lemon wedges, hard sour candies, chewing gum, or breath spray may also help to moisten your mouth.  Keep the temperature in your home at a cooler level. Dry air increases thirst, so keep the air in your home as humid as possible.  Avoid being out in the hot sun, which can cause you to sweat and become thirsty. What are some signs that I may be taking in too much fluid? You may be taking in too much fluid if:  Your weight increases. Contact your health care provider if your weight increases 3 lb or more in a day or if it increases 5 lb or more in a week.  Your face, hands, legs, feet, and belly (abdomen) start to swell.  You have trouble breathing. This information is not intended to replace advice given to you by your health care provider. Make sure you discuss any questions you have with your health care provider. Document Released: 01/22/2007 Document Revised: 09/02/2015 Document Reviewed: 08/26/2013 Elsevier Interactive Patient Education  2017 [employer].

## 2016-08-04 ENCOUNTER — Ambulatory Visit: Payer: Self-pay | Admitting: Family Medicine

## 2016-08-04 ENCOUNTER — Encounter: Payer: Self-pay | Admitting: Family Medicine

## 2016-08-04 ENCOUNTER — Ambulatory Visit (INDEPENDENT_AMBULATORY_CARE_PROVIDER_SITE_OTHER): Payer: Medicaid Other | Admitting: Family Medicine

## 2016-08-04 DIAGNOSIS — F17200 Nicotine dependence, unspecified, uncomplicated: Secondary | ICD-10-CM | POA: Diagnosis not present

## 2016-08-04 DIAGNOSIS — R600 Localized edema: Secondary | ICD-10-CM | POA: Diagnosis not present

## 2016-08-04 DIAGNOSIS — R0602 Shortness of breath: Secondary | ICD-10-CM

## 2016-08-04 DIAGNOSIS — J45909 Unspecified asthma, uncomplicated: Secondary | ICD-10-CM | POA: Diagnosis not present

## 2016-08-04 MED ORDER — NICOTINE POLACRILEX 4 MG MT LOZG: 4.0000 mg | LOZENGE | OROMUCOSAL | 1 refills | Status: DC | PRN

## 2016-08-04 NOTE — Assessment & Plan Note (Signed)
Still smoking 1.5 PPD. Interested in smoking cessation. -prescribed nicotine replacement lozenges

## 2016-08-04 NOTE — Patient Instructions (Addendum)
It was nice to see you today.  Asthma/Breathing - continue Dulera everyday, use Albuterol as needed  Smoking - please quit smoking, try nicotine "candies" lozenges  Continue the fluid pill, attempt to cut down on the number of sodas that you drink daily.

## 2016-08-04 NOTE — Assessment & Plan Note (Signed)
Improved from visit on 4/19. -continue daily Dulera -discussed smoking cessation -use albuterol prn (goal of less than once per day)

## 2016-08-04 NOTE — Assessment & Plan Note (Signed)
Multifactorial form tobacco abuse, Asthma (however FPT's in 2016 showed restrictive pattern), and Diastolic CHF. Improved with diuresis and Dulera/albuterol. -continue current treatment -patient no longer follows with Pulmonology but consider repeat referral if symptoms persist -CT in 2016 showed ground glass opacities that were nonspecific, given restrictive pattern on PFT's previous would consider repeat CT chest if symptoms worsen

## 2016-08-04 NOTE — Assessment & Plan Note (Signed)
Likely due to Diastolic CHF and venous insufficiency. Improved from last visit. -continue Lasix 40 mg BID -check BMP to monitor electrolytes and renal function

## 2016-08-05 LAB — BASIC METABOLIC PANEL
BUN/Creatinine Ratio: 8 — ABNORMAL LOW (ref 9–23)
BUN: 8 mg/dL (ref 6–24)
CALCIUM: 9.3 mg/dL (ref 8.7–10.2)
CHLORIDE: 81 mmol/L — AB (ref 96–106)
CO2: 29 mmol/L (ref 18–29)
Creatinine, Ser: 1.05 mg/dL — ABNORMAL HIGH (ref 0.57–1.00)
GFR calc non Af Amer: 60 mL/min/{1.73_m2} (ref >59)
GFR, EST AFRICAN AMERICAN: 69 mL/min/{1.73_m2} (ref >59)
GLUCOSE: 93 mg/dL (ref 65–99)
POTASSIUM: 3.3 mmol/L — AB (ref 3.5–5.2)
Sodium: 135 mmol/L (ref 134–144)

## 2016-08-07 ENCOUNTER — Telehealth: Payer: Self-pay | Admitting: Family Medicine

## 2016-08-07 DIAGNOSIS — E876 Hypokalemia: Secondary | ICD-10-CM

## 2016-08-07 MED ORDER — POTASSIUM CHLORIDE CRYS ER 20 MEQ PO TBCR: 20.0000 meq | EXTENDED_RELEASE_TABLET | Freq: Every day | ORAL | 3 refills | Status: DC

## 2016-08-07 NOTE — Telephone Encounter (Signed)
Informed patient of low K. Will start potassium 20 meq daily. Cr mildly bumped likely due to lasix. Continue to monitor at subsequent visits.

## 2016-08-07 NOTE — Assessment & Plan Note (Signed)
Low. Due to lasix. Start 20 mEq daily.

## 2016-08-09 ENCOUNTER — Telehealth: Payer: Self-pay | Admitting: Cardiovascular Disease

## 2016-08-09 NOTE — Telephone Encounter (Signed)
Pt's sister stated pt isn't restricting her fluids like Dr Duke Salvia told her too. Pt's sister want to speak to nurse with the concern and maybe want her to be hospitalized to help her. Please call sister if she doesn't answer leave a message.

## 2016-08-09 NOTE — Telephone Encounter (Signed)
Spoke with patients sister and swelling has improved but she continues to drink fluid over the recommended amount. Per sister patient refuses to decrease her fluid intake. Advised to continue to encourage patient to be compliant with restrictions and keep follow up appointment as scheduled

## 2016-08-10 ENCOUNTER — Inpatient Hospital Stay (HOSPITAL_COMMUNITY)
Admission: EM | Admit: 2016-08-10 | Discharge: 2016-08-16 | DRG: 291 | Disposition: A | Discharge status: Home / Self Care | Payer: Medicaid Other | Attending: Family Medicine | Admitting: Family Medicine

## 2016-08-10 ENCOUNTER — Inpatient Hospital Stay (HOSPITAL_COMMUNITY): Payer: Medicaid Other

## 2016-08-10 ENCOUNTER — Encounter (HOSPITAL_COMMUNITY): Payer: Self-pay

## 2016-08-10 ENCOUNTER — Emergency Department (HOSPITAL_COMMUNITY): Payer: Medicaid Other

## 2016-08-10 DIAGNOSIS — R4182 Altered mental status, unspecified: Secondary | ICD-10-CM | POA: Diagnosis not present

## 2016-08-10 DIAGNOSIS — Z7951 Long term (current) use of inhaled steroids: Secondary | ICD-10-CM

## 2016-08-10 DIAGNOSIS — G4733 Obstructive sleep apnea (adult) (pediatric): Secondary | ICD-10-CM | POA: Diagnosis present

## 2016-08-10 DIAGNOSIS — N179 Acute kidney failure, unspecified: Secondary | ICD-10-CM | POA: Diagnosis present

## 2016-08-10 DIAGNOSIS — F17210 Nicotine dependence, cigarettes, uncomplicated: Secondary | ICD-10-CM | POA: Diagnosis present

## 2016-08-10 DIAGNOSIS — J9622 Acute and chronic respiratory failure with hypercapnia: Secondary | ICD-10-CM | POA: Diagnosis present

## 2016-08-10 DIAGNOSIS — R197 Diarrhea, unspecified: Secondary | ICD-10-CM | POA: Diagnosis not present

## 2016-08-10 DIAGNOSIS — J441 Chronic obstructive pulmonary disease with (acute) exacerbation: Secondary | ICD-10-CM | POA: Diagnosis present

## 2016-08-10 DIAGNOSIS — Z7984 Long term (current) use of oral hypoglycemic drugs: Secondary | ICD-10-CM

## 2016-08-10 DIAGNOSIS — J9621 Acute and chronic respiratory failure with hypoxia: Secondary | ICD-10-CM | POA: Diagnosis present

## 2016-08-10 DIAGNOSIS — E785 Hyperlipidemia, unspecified: Secondary | ICD-10-CM | POA: Diagnosis present

## 2016-08-10 DIAGNOSIS — I5033 Acute on chronic diastolic (congestive) heart failure: Secondary | ICD-10-CM | POA: Diagnosis present

## 2016-08-10 DIAGNOSIS — B9689 Other specified bacterial agents as the cause of diseases classified elsewhere: Secondary | ICD-10-CM | POA: Diagnosis not present

## 2016-08-10 DIAGNOSIS — I509 Heart failure, unspecified: Secondary | ICD-10-CM | POA: Diagnosis not present

## 2016-08-10 DIAGNOSIS — R778 Other specified abnormalities of plasma proteins: Secondary | ICD-10-CM

## 2016-08-10 DIAGNOSIS — K922 Gastrointestinal hemorrhage, unspecified: Secondary | ICD-10-CM

## 2016-08-10 DIAGNOSIS — I248 Other forms of acute ischemic heart disease: Secondary | ICD-10-CM | POA: Diagnosis present

## 2016-08-10 DIAGNOSIS — E871 Hypo-osmolality and hyponatremia: Secondary | ICD-10-CM | POA: Diagnosis present

## 2016-08-10 DIAGNOSIS — R319 Hematuria, unspecified: Secondary | ICD-10-CM | POA: Diagnosis present

## 2016-08-10 DIAGNOSIS — B1910 Unspecified viral hepatitis B without hepatic coma: Secondary | ICD-10-CM | POA: Diagnosis present

## 2016-08-10 DIAGNOSIS — R400 Somnolence: Secondary | ICD-10-CM | POA: Diagnosis not present

## 2016-08-10 DIAGNOSIS — I080 Rheumatic disorders of both mitral and aortic valves: Secondary | ICD-10-CM | POA: Diagnosis present

## 2016-08-10 DIAGNOSIS — K920 Hematemesis: Secondary | ICD-10-CM | POA: Diagnosis present

## 2016-08-10 DIAGNOSIS — Z79899 Other long term (current) drug therapy: Secondary | ICD-10-CM

## 2016-08-10 DIAGNOSIS — G9340 Encephalopathy, unspecified: Secondary | ICD-10-CM | POA: Diagnosis present

## 2016-08-10 DIAGNOSIS — F200 Paranoid schizophrenia: Secondary | ICD-10-CM | POA: Diagnosis present

## 2016-08-10 DIAGNOSIS — E1122 Type 2 diabetes mellitus with diabetic chronic kidney disease: Secondary | ICD-10-CM | POA: Diagnosis present

## 2016-08-10 DIAGNOSIS — A0472 Enterocolitis due to Clostridium difficile, not specified as recurrent: Secondary | ICD-10-CM | POA: Diagnosis present

## 2016-08-10 DIAGNOSIS — I5032 Chronic diastolic (congestive) heart failure: Secondary | ICD-10-CM

## 2016-08-10 DIAGNOSIS — E876 Hypokalemia: Secondary | ICD-10-CM | POA: Diagnosis present

## 2016-08-10 DIAGNOSIS — Z21 Asymptomatic human immunodeficiency virus [HIV] infection status: Secondary | ICD-10-CM | POA: Diagnosis present

## 2016-08-10 DIAGNOSIS — N189 Chronic kidney disease, unspecified: Secondary | ICD-10-CM | POA: Diagnosis present

## 2016-08-10 DIAGNOSIS — E7220 Disorder of urea cycle metabolism, unspecified: Secondary | ICD-10-CM | POA: Diagnosis present

## 2016-08-10 DIAGNOSIS — N390 Urinary tract infection, site not specified: Secondary | ICD-10-CM | POA: Diagnosis present

## 2016-08-10 DIAGNOSIS — Z833 Family history of diabetes mellitus: Secondary | ICD-10-CM

## 2016-08-10 DIAGNOSIS — J9601 Acute respiratory failure with hypoxia: Secondary | ICD-10-CM | POA: Diagnosis not present

## 2016-08-10 DIAGNOSIS — R7989 Other specified abnormal findings of blood chemistry: Secondary | ICD-10-CM

## 2016-08-10 DIAGNOSIS — Z91410 Personal history of adult physical and sexual abuse: Secondary | ICD-10-CM

## 2016-08-10 DIAGNOSIS — R0602 Shortness of breath: Secondary | ICD-10-CM

## 2016-08-10 DIAGNOSIS — R8281 Pyuria: Secondary | ICD-10-CM

## 2016-08-10 DIAGNOSIS — Z8249 Family history of ischemic heart disease and other diseases of the circulatory system: Secondary | ICD-10-CM

## 2016-08-10 DIAGNOSIS — R748 Abnormal levels of other serum enzymes: Secondary | ICD-10-CM | POA: Diagnosis not present

## 2016-08-10 HISTORY — DX: Nonrheumatic aortic (valve) insufficiency: I35.1

## 2016-08-10 HISTORY — DX: Nonrheumatic aortic (valve) stenosis: I35.0

## 2016-08-10 HISTORY — DX: Rheumatic mitral valve disease, unspecified: I05.9

## 2016-08-10 HISTORY — DX: Type 2 diabetes mellitus without complications: E11.9

## 2016-08-10 HISTORY — DX: Pulmonary hypertension, unspecified: I27.20

## 2016-08-10 HISTORY — DX: Chronic diastolic (congestive) heart failure: I50.32

## 2016-08-10 LAB — I-STAT ARTERIAL BLOOD GAS, ED
ACID-BASE EXCESS: 11 mmol/L — AB (ref 0.0–2.0)
Bicarbonate: 40.8 mmol/L — ABNORMAL HIGH (ref 20.0–28.0)
O2 Saturation: 96 %
PO2 ART: 94 mmHg (ref 83.0–108.0)
TCO2: 43 mmol/L (ref 0–100)
pCO2 arterial: 80.5 mmHg (ref 32.0–48.0)
pH, Arterial: 7.314 — ABNORMAL LOW (ref 7.350–7.450)

## 2016-08-10 LAB — CBC WITH DIFFERENTIAL/PLATELET
BASOS ABS: 0 10*3/uL (ref 0.0–0.1)
Basophils Relative: 0 %
EOS ABS: 0 10*3/uL (ref 0.0–0.7)
Eosinophils Relative: 0 %
HCT: 33.5 % — ABNORMAL LOW (ref 36.0–46.0)
HEMOGLOBIN: 11.4 g/dL — AB (ref 12.0–15.0)
LYMPHS ABS: 0.7 10*3/uL (ref 0.7–4.0)
Lymphocytes Relative: 10 %
MCH: 27.9 pg (ref 26.0–34.0)
MCHC: 34 g/dL (ref 30.0–36.0)
MCV: 81.9 fL (ref 78.0–100.0)
MONO ABS: 0.4 10*3/uL (ref 0.1–1.0)
Monocytes Relative: 6 %
NEUTROS ABS: 6 10*3/uL (ref 1.7–7.7)
Neutrophils Relative %: 84 %
PLATELETS: 248 10*3/uL (ref 150–400)
RBC: 4.09 MIL/uL (ref 3.87–5.11)
RDW: 17.1 % — ABNORMAL HIGH (ref 11.5–15.5)
WBC: 7.2 10*3/uL (ref 4.0–10.5)

## 2016-08-10 LAB — BASIC METABOLIC PANEL
Anion gap: 13 (ref 5–15)
BUN: 25 mg/dL — ABNORMAL HIGH (ref 6–20)
CALCIUM: 7.9 mg/dL — AB (ref 8.9–10.3)
CHLORIDE: 72 mmol/L — AB (ref 101–111)
CO2: 36 mmol/L — AB (ref 22–32)
CREATININE: 1.72 mg/dL — AB (ref 0.44–1.00)
GFR calc Af Amer: 37 mL/min — ABNORMAL LOW (ref >60)
GFR calc non Af Amer: 32 mL/min — ABNORMAL LOW (ref >60)
Glucose, Bld: 120 mg/dL — ABNORMAL HIGH (ref 65–99)
Potassium: 2.9 mmol/L — ABNORMAL LOW (ref 3.5–5.1)
Sodium: 121 mmol/L — ABNORMAL LOW (ref 135–145)

## 2016-08-10 LAB — BRAIN NATRIURETIC PEPTIDE: B NATRIURETIC PEPTIDE 5: 2017.7 pg/mL — AB (ref 0.0–100.0)

## 2016-08-10 LAB — TROPONIN I: TROPONIN I: 0.11 ng/mL — AB (ref <0.03)

## 2016-08-10 LAB — MAGNESIUM: Magnesium: 1.5 mg/dL — ABNORMAL LOW (ref 1.7–2.4)

## 2016-08-10 MED ORDER — IPRATROPIUM-ALBUTEROL 0.5-2.5 (3) MG/3ML IN SOLN
3.0000 mL | Freq: Once | RESPIRATORY_TRACT | Status: AC
  Administered 2016-08-10: 3 mL via RESPIRATORY_TRACT
  Filled 2016-08-10: qty 3

## 2016-08-10 MED ORDER — PANTOPRAZOLE SODIUM 40 MG PO TBEC: 40.0000 mg | DELAYED_RELEASE_TABLET | Freq: Every day | ORAL | Status: DC

## 2016-08-10 MED ORDER — ASPIRIN 81 MG PO CHEW
324.0000 mg | CHEWABLE_TABLET | Freq: Once | ORAL | Status: AC
  Administered 2016-08-10: 324 mg via ORAL
  Filled 2016-08-10: qty 4

## 2016-08-10 MED ORDER — HEPARIN SODIUM (PORCINE) 5000 UNIT/ML IJ SOLN: 5000.0000 [IU] | Freq: Three times a day (TID) | INTRAMUSCULAR | Status: DC

## 2016-08-10 MED ORDER — NICOTINE 21 MG/24HR TD PT24
21.0000 mg | MEDICATED_PATCH | Freq: Every day | TRANSDERMAL | Status: DC
  Administered 2016-08-11 – 2016-08-16 (×6): 21 mg via TRANSDERMAL
  Filled 2016-08-10 (×6): qty 1

## 2016-08-10 MED ORDER — POTASSIUM CHLORIDE 10 MEQ/100ML IV SOLN
10.0000 meq | INTRAVENOUS | Status: AC
  Administered 2016-08-10 (×3): 10 meq via INTRAVENOUS
  Filled 2016-08-10 (×3): qty 100

## 2016-08-10 MED ORDER — DARUNAVIR ETHANOLATE 800 MG PO TABS: 800.0000 mg | ORAL_TABLET | Freq: Every day | ORAL | Status: DC

## 2016-08-10 MED ORDER — SODIUM CHLORIDE 0.9% FLUSH
3.0000 mL | Freq: Two times a day (BID) | INTRAVENOUS | Status: DC
  Administered 2016-08-11 – 2016-08-16 (×10): 3 mL via INTRAVENOUS

## 2016-08-10 MED ORDER — MAGNESIUM SULFATE 2 GM/50ML IV SOLN
2.0000 g | Freq: Once | INTRAVENOUS | Status: AC
  Administered 2016-08-10: 2 g via INTRAVENOUS
  Filled 2016-08-10: qty 50

## 2016-08-10 MED ORDER — MOMETASONE FURO-FORMOTEROL FUM 200-5 MCG/ACT IN AERO
2.0000 | INHALATION_SPRAY | Freq: Two times a day (BID) | RESPIRATORY_TRACT | Status: DC
  Administered 2016-08-11: 2 via RESPIRATORY_TRACT
  Filled 2016-08-10: qty 8.8

## 2016-08-10 MED ORDER — SODIUM CHLORIDE 0.9 % IV BOLUS (SEPSIS)
250.0000 mL | Freq: Once | INTRAVENOUS | Status: AC
  Administered 2016-08-10: 250 mL via INTRAVENOUS

## 2016-08-10 MED ORDER — FUROSEMIDE 10 MG/ML IJ SOLN
20.0000 mg | Freq: Once | INTRAMUSCULAR | Status: AC
  Administered 2016-08-11: 20 mg via INTRAVENOUS
  Filled 2016-08-10: qty 2

## 2016-08-10 MED ORDER — RITONAVIR 100 MG PO TABS: 100.0000 mg | ORAL_TABLET | Freq: Every day | ORAL | Status: DC

## 2016-08-10 MED ORDER — ATORVASTATIN CALCIUM 10 MG PO TABS: 10.0000 mg | ORAL_TABLET | Freq: Every day | ORAL | Status: DC

## 2016-08-10 MED ORDER — POTASSIUM CHLORIDE CRYS ER 20 MEQ PO TBCR
20.0000 meq | EXTENDED_RELEASE_TABLET | Freq: Once | ORAL | Status: AC
  Administered 2016-08-10: 20 meq via ORAL
  Filled 2016-08-10: qty 1

## 2016-08-10 MED ORDER — POTASSIUM CHLORIDE 10 MEQ/100ML IV SOLN: 10.0000 meq | Freq: Once | INTRAVENOUS | Status: DC

## 2016-08-10 MED ORDER — IPRATROPIUM-ALBUTEROL 0.5-2.5 (3) MG/3ML IN SOLN
3.0000 mL | Freq: Four times a day (QID) | RESPIRATORY_TRACT | Status: DC
  Administered 2016-08-10 – 2016-08-11 (×5): 3 mL via RESPIRATORY_TRACT
  Filled 2016-08-10 (×4): qty 3

## 2016-08-10 MED ORDER — ACETAMINOPHEN 325 MG PO TABS
650.0000 mg | ORAL_TABLET | Freq: Four times a day (QID) | ORAL | Status: DC | PRN
  Administered 2016-08-12: 650 mg via ORAL
  Filled 2016-08-10: qty 2

## 2016-08-10 MED ORDER — IPRATROPIUM-ALBUTEROL 0.5-2.5 (3) MG/3ML IN SOLN: 3.0000 mL | RESPIRATORY_TRACT | Status: DC | PRN

## 2016-08-10 NOTE — ED Notes (Signed)
Admitting at bedside 

## 2016-08-10 NOTE — ED Notes (Signed)
Report attempted 5W 

## 2016-08-10 NOTE — ED Notes (Signed)
Patient transported to CT 
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
## 2016-08-10 NOTE — ED Triage Notes (Signed)
Pt arrived via [facility] from home c/o SOB and right hip pain. EMS gave 10mg  albuterol, 0.5mg  Atrovent, 125mg  Solumedrol, 350ml NS.

## 2016-08-10 NOTE — ED Notes (Signed)
Pt returned from CT °
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
## 2016-08-10 NOTE — ED Notes (Signed)
Patient transported to X-ray 

## 2016-08-10 NOTE — ED Notes (Signed)
Respiratory at bedside getting blood gas.  
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
## 2016-08-11 ENCOUNTER — Encounter (HOSPITAL_COMMUNITY): Payer: Self-pay | Admitting: Physician Assistant

## 2016-08-11 DIAGNOSIS — R400 Somnolence: Secondary | ICD-10-CM

## 2016-08-11 DIAGNOSIS — K922 Gastrointestinal hemorrhage, unspecified: Secondary | ICD-10-CM

## 2016-08-11 DIAGNOSIS — J9622 Acute and chronic respiratory failure with hypercapnia: Secondary | ICD-10-CM

## 2016-08-11 DIAGNOSIS — J9601 Acute respiratory failure with hypoxia: Secondary | ICD-10-CM

## 2016-08-11 DIAGNOSIS — E876 Hypokalemia: Secondary | ICD-10-CM

## 2016-08-11 DIAGNOSIS — R4182 Altered mental status, unspecified: Secondary | ICD-10-CM

## 2016-08-11 DIAGNOSIS — R0602 Shortness of breath: Secondary | ICD-10-CM

## 2016-08-11 DIAGNOSIS — R778 Other specified abnormalities of plasma proteins: Secondary | ICD-10-CM

## 2016-08-11 DIAGNOSIS — R748 Abnormal levels of other serum enzymes: Secondary | ICD-10-CM

## 2016-08-11 DIAGNOSIS — I5032 Chronic diastolic (congestive) heart failure: Secondary | ICD-10-CM

## 2016-08-11 DIAGNOSIS — R7989 Other specified abnormal findings of blood chemistry: Secondary | ICD-10-CM

## 2016-08-11 LAB — TYPE AND SCREEN
ABO/RH(D): B POS
Antibody Screen: NEGATIVE

## 2016-08-11 LAB — RAPID URINE DRUG SCREEN, HOSP PERFORMED
AMPHETAMINES: NOT DETECTED
BENZODIAZEPINES: NOT DETECTED
Barbiturates: NOT DETECTED
Cocaine: NOT DETECTED
OPIATES: NOT DETECTED
TETRAHYDROCANNABINOL: NOT DETECTED

## 2016-08-11 LAB — BLOOD GAS, ARTERIAL
Acid-Base Excess: 20.4 mmol/L — ABNORMAL HIGH (ref 0.0–2.0)
Bicarbonate: 46.1 mmol/L — ABNORMAL HIGH (ref 20.0–28.0)
DRAWN BY: 246861
O2 CONTENT: 2 L/min
O2 SAT: 87.7 %
PATIENT TEMPERATURE: 98.6
PO2 ART: 59.7 mmHg — AB (ref 83.0–108.0)
pCO2 arterial: 68 mmHg (ref 32.0–48.0)
pH, Arterial: 7.446 (ref 7.350–7.450)

## 2016-08-11 LAB — COMPREHENSIVE METABOLIC PANEL
ALBUMIN: 2.9 g/dL — AB (ref 3.5–5.0)
ALT: 180 U/L — ABNORMAL HIGH (ref 14–54)
AST: 371 U/L — AB (ref 15–41)
Alkaline Phosphatase: 78 U/L (ref 38–126)
Anion gap: 14 (ref 5–15)
BUN: 23 mg/dL — AB (ref 6–20)
CHLORIDE: 74 mmol/L — AB (ref 101–111)
CO2: 37 mmol/L — ABNORMAL HIGH (ref 22–32)
Calcium: 8.3 mg/dL — ABNORMAL LOW (ref 8.9–10.3)
Creatinine, Ser: 1.39 mg/dL — ABNORMAL HIGH (ref 0.44–1.00)
GFR calc Af Amer: 48 mL/min — ABNORMAL LOW (ref >60)
GFR, EST NON AFRICAN AMERICAN: 41 mL/min — AB (ref >60)
GLUCOSE: 176 mg/dL — AB (ref 65–99)
POTASSIUM: 3.1 mmol/L — AB (ref 3.5–5.1)
Sodium: 125 mmol/L — ABNORMAL LOW (ref 135–145)
Total Bilirubin: 0.6 mg/dL (ref 0.3–1.2)
Total Protein: 6.4 g/dL — ABNORMAL LOW (ref 6.5–8.1)

## 2016-08-11 LAB — URINALYSIS, ROUTINE W REFLEX MICROSCOPIC
BILIRUBIN URINE: NEGATIVE
Glucose, UA: NEGATIVE mg/dL
KETONES UR: NEGATIVE mg/dL
Nitrite: NEGATIVE
Protein, ur: 30 mg/dL — AB
Specific Gravity, Urine: 1.013 (ref 1.005–1.030)
pH: 5 (ref 5.0–8.0)

## 2016-08-11 LAB — VALPROIC ACID LEVEL: Valproic Acid Lvl: 35 ug/mL — ABNORMAL LOW (ref 50.0–100.0)

## 2016-08-11 LAB — CBC
HEMATOCRIT: 35.2 % — AB (ref 36.0–46.0)
HEMATOCRIT: 35.6 % — AB (ref 36.0–46.0)
HEMOGLOBIN: 11.9 g/dL — AB (ref 12.0–15.0)
Hemoglobin: 11.9 g/dL — ABNORMAL LOW (ref 12.0–15.0)
MCH: 28.1 pg (ref 26.0–34.0)
MCH: 28 pg (ref 26.0–34.0)
MCHC: 33.8 g/dL (ref 30.0–36.0)
MCHC: 33.4 g/dL (ref 30.0–36.0)
MCV: 83 fL (ref 78.0–100.0)
MCV: 83.8 fL (ref 78.0–100.0)
PLATELETS: 261 10*3/uL (ref 150–400)
Platelets: 239 10*3/uL (ref 150–400)
RBC: 4.24 MIL/uL (ref 3.87–5.11)
RBC: 4.25 MIL/uL (ref 3.87–5.11)
RDW: 17.4 % — AB (ref 11.5–15.5)
RDW: 17.8 % — AB (ref 11.5–15.5)
WBC: 5.2 10*3/uL (ref 4.0–10.5)
WBC: 6 10*3/uL (ref 4.0–10.5)

## 2016-08-11 LAB — PROTIME-INR
INR: 1.01
PROTHROMBIN TIME: 13.3 s (ref 11.4–15.2)

## 2016-08-11 LAB — AMMONIA: Ammonia: 63 umol/L — ABNORMAL HIGH (ref 9–35)

## 2016-08-11 LAB — SODIUM, URINE, RANDOM: Sodium, Ur: 16 mmol/L

## 2016-08-11 LAB — CREATININE, URINE, RANDOM: CREATININE, URINE: 88.47 mg/dL

## 2016-08-11 LAB — MAGNESIUM: Magnesium: 2.4 mg/dL (ref 1.7–2.4)

## 2016-08-11 LAB — OCCULT BLOOD X 1 CARD TO LAB, STOOL: FECAL OCCULT BLD: POSITIVE — AB

## 2016-08-11 LAB — TROPONIN I
Troponin I: 0.11 ng/mL (ref <0.03)
Troponin I: 0.09 ng/mL (ref <0.03)

## 2016-08-11 LAB — ETHANOL: Alcohol, Ethyl (B): <5 mg/dL (ref <5)

## 2016-08-11 LAB — TSH: TSH: 2.16 u[IU]/mL (ref 0.350–4.500)

## 2016-08-11 LAB — ABO/RH: ABO/RH(D): B POS

## 2016-08-11 LAB — MRSA PCR SCREENING: MRSA by PCR: NEGATIVE

## 2016-08-11 MED ORDER — BUDESONIDE 0.5 MG/2ML IN SUSP
0.5000 mg | Freq: Two times a day (BID) | RESPIRATORY_TRACT | Status: DC
  Administered 2016-08-11 – 2016-08-16 (×10): 0.5 mg via RESPIRATORY_TRACT
  Filled 2016-08-11 (×10): qty 2

## 2016-08-11 MED ORDER — ARFORMOTEROL TARTRATE 15 MCG/2ML IN NEBU
15.0000 ug | INHALATION_SOLUTION | Freq: Two times a day (BID) | RESPIRATORY_TRACT | Status: DC
  Administered 2016-08-11 – 2016-08-16 (×10): 15 ug via RESPIRATORY_TRACT
  Filled 2016-08-11 (×10): qty 2

## 2016-08-11 MED ORDER — RITONAVIR 100 MG PO TABS
100.0000 mg | ORAL_TABLET | Freq: Every day | ORAL | Status: DC
  Administered 2016-08-12 – 2016-08-16 (×5): 100 mg via ORAL
  Filled 2016-08-11 (×5): qty 1

## 2016-08-11 MED ORDER — POTASSIUM CHLORIDE 10 MEQ/100ML IV SOLN
10.0000 meq | INTRAVENOUS | Status: AC
  Administered 2016-08-11 (×3): 10 meq via INTRAVENOUS
  Filled 2016-08-11 (×3): qty 100

## 2016-08-11 MED ORDER — FUROSEMIDE 10 MG/ML IJ SOLN
40.0000 mg | Freq: Two times a day (BID) | INTRAMUSCULAR | Status: DC
  Administered 2016-08-11 – 2016-08-13 (×4): 40 mg via INTRAVENOUS
  Filled 2016-08-11 (×5): qty 4

## 2016-08-11 MED ORDER — DARUNAVIR ETHANOLATE 800 MG PO TABS
800.0000 mg | ORAL_TABLET | Freq: Every day | ORAL | Status: DC
  Administered 2016-08-12 – 2016-08-16 (×5): 800 mg via ORAL
  Filled 2016-08-11 (×6): qty 1

## 2016-08-11 MED ORDER — METHYLPREDNISOLONE SODIUM SUCC 40 MG IJ SOLR
40.0000 mg | Freq: Every day | INTRAMUSCULAR | Status: DC
  Administered 2016-08-11 – 2016-08-12 (×2): 40 mg via INTRAVENOUS
  Filled 2016-08-11 (×2): qty 1

## 2016-08-11 MED ORDER — EMTRICITABINE-TENOFOVIR AF 200-25 MG PO TABS
1.0000 | ORAL_TABLET | Freq: Every day | ORAL | Status: DC
  Administered 2016-08-11 – 2016-08-16 (×6): 1 via ORAL
  Filled 2016-08-11 (×7): qty 1

## 2016-08-11 MED ORDER — IPRATROPIUM-ALBUTEROL 0.5-2.5 (3) MG/3ML IN SOLN
3.0000 mL | Freq: Three times a day (TID) | RESPIRATORY_TRACT | Status: DC
  Administered 2016-08-12 – 2016-08-15 (×9): 3 mL via RESPIRATORY_TRACT
  Filled 2016-08-11 (×10): qty 3

## 2016-08-11 MED ORDER — PANTOPRAZOLE SODIUM 40 MG IV SOLR
40.0000 mg | Freq: Two times a day (BID) | INTRAVENOUS | Status: DC
  Administered 2016-08-11 – 2016-08-14 (×8): 40 mg via INTRAVENOUS
  Filled 2016-08-11 (×8): qty 40

## 2016-08-11 NOTE — Progress Notes (Signed)
Checked on patient at bedside with Dr. Nelson ChimesAmin. Patient had just thrown up on herself and vomitus was reddish and thick with food remnants. Patient reluctant to respond to painful stimuli but after about a minute she opened her eyes and was conversant, though speech somewhat difficult to understand. Moving air throughout lungs but still with inspiratory and expiratory wheezing. Sample for occult blood testing of vomitus obtained. Patient's bed positioned upright. Changed diet order to NPO without sips for meds.   Dani GobbleHillary Fitzgerald, MD [facility], PGY-2

## 2016-08-11 NOTE — Progress Notes (Signed)
Pt was still throwing out coffee ground emesis in a large amount, on call physician from family medicine was page about it, came over to the floor to review  pt, order carried out will continue to monitor pt
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
## 2016-08-11 NOTE — Progress Notes (Signed)
CRITICAL VALUE ALERT  Critical value received:  CO2 68  Date of notification:  08/11/16  Time of notification:  1029  Critical value read back:Yes.    Nurse who received alert:  Madelin RearLONNIE Anquanette Bahner  MD notified (1st page):  FAMILY MEDICINE  Time of first page:  1030  MD notified (2nd page):  Time of second page:  Responding MD:  FAMILY MEDICINE  Time MD responded:  1031  TRANSFERRING PATIENT TO STEPDOWN

## 2016-08-11 NOTE — Consult Note (Signed)
[facility]  Date of Admission:  08/10/2016  Date of Consult:  08/11/2016  Reason for Consult: HIV+ Referring Physician: FPTS  Impression/Recommendation AIDS Would change her truvada to descovy to be more bone and renal sparing. Continue norvir and darunavir for now, could consider change to prezcobix? May worsen her Cr.. Recheck CD4 and HIV RNA Her prev numbers suggest that this is not an opportunistic infection.   Hypercarbia HypoNatremia SOB, confusion CHF  My great appreciation to the coustanding care of FPTS, CV, CCM, GI  Thank you so much for this interesting consult,   Bobby Rumpf (pager) [phone] [email or web address]  [name] is an 56 y.o. female.  HPI: 56 yo F with hx of AIDS (DRVr/TRV), DM2, schizophrenia, ETOH abuse,adm on 5-3 with worsening SOB, worsening level of consciousness/confusion, felt to be due to CHF exacerbation.  Her course has been complicated by hematemesis (stable HCT), hypercarbia, elevated BNP, and positive troponins.   HIV 1 RNA Quant (copies/mL)  Date Value  06/08/2016 <20 DETECTED (A)  10/14/2015 40 (H)  08/05/2015 112 (H)   CD4 T Cell Abs (/uL)  Date Value  06/08/2016 820  04/20/2016 880  10/14/2015 790     Past Medical History:  Diagnosis Date  . AIDS ([place]) 12-10-2007  . Aortic insufficiency   . Aortic stenosis   . Cataract   . Chronic asthmatic bronchitis ([place])   . Chronic diastolic CHF (congestive heart failure) ([place])   . COPD (chronic obstructive pulmonary disease) ([place])   . Diabetes mellitus ([place])   . Diabetes mellitus without complication ([place])    type 2 with out complication  . GERD (gastroesophageal reflux disease)   . Hepatitis B infection    followed by ID  . Herpes simplex   . History of cholecystectomy   . Pulmonary hypertension ([place])   . Rheumatic mitral valve disease   . Schizophrenia ([place])   . Seizures ([place])   . Tobacco use   . 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 CHOLECYSTECTOMY    . LEG SURGERY Right    s/p accident; "steel fell on the back of my leg"  . TONSILLECTOMY       No Known Allergies  Medications:  Scheduled: . arformoterol  15 mcg Nebulization BID  . atorvastatin  10 mg Oral q1800  . budesonide (PULMICORT) nebulizer solution  0.5 mg Nebulization BID  . ipratropium-albuterol  3 mL Nebulization Q6H  . methylPREDNISolone (SOLU-MEDROL) injection  40 mg Intravenous Daily  . nicotine  21 mg Transdermal Daily  . pantoprazole (PROTONIX) IV  40 mg Intravenous Q12H  . sodium chloride flush  3 mL Intravenous Q12H    Abtx:  Anti-infectives    Start     Dose/Rate Route Frequency Ordered Stop   08/11/16 0800  ritonavir (NORVIR) tablet 100 mg  Status:  Discontinued     100 mg Oral Daily with breakfast 08/10/16 2128 08/10/16 2212   08/11/16 0800  darunavir (PREZISTA) tablet 800 mg  Status:  Discontinued     800 mg Oral Daily with breakfast 08/10/16 2128 08/10/16 2212      Total days of antibiotics: 0          Social History:  reports that she has been smoking Cigarettes.  She started smoking about 42 years ago. She has a 52.50 pack-year smoking history. She has never used smokeless tobacco. She reports that she  drinks about 2.4 oz of alcohol per week . She reports that she does not use drugs.  Family History  Problem Relation Age of Onset  . Cancer Sister     breast  . Diabetes Brother   . Hypertension Brother   . Cancer Mother     lung  . Heart disease Father   . Heart attack Father     General ROS: family helps her with her meds. denies missed doses, no oral sores, no change in BM or urine. c/o back pain.  Please see HPI. 12 point ROS o/w (-)  Blood pressure 114/68, pulse 71, temperature 97.8 F (36.6 C), resp. rate 18, height 4' 11"  (1.499 m), weight 69.1 kg (152 lb 6.4 oz), SpO2 98 %. General appearance: no distress and begins yelling "check my back!"  during exam, falls asleep during exam.  Eyes: negative findings: conjunctivae and sclerae normal and pupils equal, round, reactive to light and accomodation, no photophboia Throat: normal findings: oropharynx pink & moist without lesions or evidence of thrush Neck: no adenopathy and supple, symmetrical, trachea midline Back: mild tenderness in mid-thoracic region.  Lungs: rhonchi bilaterally Heart: regular rate and rhythm Abdomen: normal findings: bowel sounds normal and soft, non-tender Extremities: edema 2+ BLE   Results for orders placed or performed during the hospital encounter of 08/10/16 (from the past 48 hour(s))  Basic metabolic panel     Status: Abnormal   Collection Time: 08/10/16  4:46 PM  Result Value Ref Range   Sodium 121 (L) 135 - 145 mmol/L   Potassium 2.9 (L) 3.5 - 5.1 mmol/L   Chloride 72 (L) 101 - 111 mmol/L   CO2 36 (H) 22 - 32 mmol/L   Glucose, Bld 120 (H) 65 - 99 mg/dL   BUN 25 (H) 6 - 20 mg/dL   Creatinine, Ser 1.72 (H) 0.44 - 1.00 mg/dL   Calcium 7.9 (L) 8.9 - 10.3 mg/dL   GFR calc non Af Amer 32 (L) >60 mL/min   GFR calc Af Amer 37 (L) >60 mL/min    Comment: (NOTE) The eGFR has been calculated using the CKD EPI equation. This calculation has not been validated in all clinical situations. eGFR's persistently <60 mL/min signify possible Chronic Kidney Disease.    Anion gap 13 5 - 15  CBC with Differential     Status: Abnormal   Collection Time: 08/10/16  4:46 PM  Result Value Ref Range   WBC 7.2 4.0 - 10.5 K/uL   RBC 4.09 3.87 - 5.11 MIL/uL   Hemoglobin 11.4 (L) 12.0 - 15.0 g/dL   HCT 33.5 (L) 36.0 - 46.0 %   MCV 81.9 78.0 - 100.0 fL   MCH 27.9 26.0 - 34.0 pg   MCHC 34.0 30.0 - 36.0 g/dL   RDW 17.1 (H) 11.5 - 15.5 %   Platelets 248 150 - 400 K/uL   Neutrophils Relative % 84 %   Neutro Abs 6.0 1.7 - 7.7 K/uL   Lymphocytes Relative 10 %   Lymphs Abs 0.7 0.7 - 4.0 K/uL   Monocytes Relative 6 %   Monocytes Absolute 0.4 0.1 - 1.0 K/uL   Eosinophils  Relative 0 %   Eosinophils Absolute 0.0 0.0 - 0.7 K/uL   Basophils Relative 0 %   Basophils Absolute 0.0 0.0 - 0.1 K/uL  Magnesium     Status: Abnormal   Collection Time: 08/10/16  4:56 PM  Result Value Ref Range   Magnesium 1.5 (L) 1.7 - 2.4 mg/dL  Troponin I     Status: Abnormal   Collection Time: 08/10/16  4:56 PM  Result Value Ref Range   Troponin I 0.11 (HH) <0.03 ng/mL    Comment: CRITICAL RESULT CALLED TO, READ BACK BY AND VERIFIED WITH: C HUGHES,RN 1930 08/10/2016 [facility]   Brain natriuretic peptide     Status: Abnormal   Collection Time: 08/10/16  6:23 PM  Result Value Ref Range   B Natriuretic Peptide 2,017.7 (H) 0.0 - 100.0 pg/mL  I-Stat arterial blood gas, ED     Status: Abnormal   Collection Time: 08/10/16 10:53 PM  Result Value Ref Range   pH, Arterial 7.314 (L) 7.350 - 7.450   pCO2 arterial 80.5 (HH) 32.0 - 48.0 mmHg   pO2, Arterial 94.0 83.0 - 108.0 mmHg   Bicarbonate 40.8 (H) 20.0 - 28.0 mmol/L   TCO2 43 0 - 100 mmol/L   O2 Saturation 96.0 %   Acid-Base Excess 11.0 (H) 0.0 - 2.0 mmol/L   Patient temperature 99.2 F    Collection site RADIAL, ALLEN'S TEST ACCEPTABLE    Drawn by RT    Sample type ARTERIAL    Comment NOTIFIED PHYSICIAN   Occult blood card to lab, stool     Status: Abnormal   Collection Time: 08/11/16 12:02 AM  Result Value Ref Range   Fecal Occult Bld POSITIVE (A) NEGATIVE  Comprehensive metabolic panel     Status: Abnormal   Collection Time: 08/11/16  2:56 AM  Result Value Ref Range   Sodium 125 (L) 135 - 145 mmol/L   Potassium 3.1 (L) 3.5 - 5.1 mmol/L   Chloride 74 (L) 101 - 111 mmol/L   CO2 37 (H) 22 - 32 mmol/L   Glucose, Bld 176 (H) 65 - 99 mg/dL   BUN 23 (H) 6 - 20 mg/dL   Creatinine, Ser 1.39 (H) 0.44 - 1.00 mg/dL   Calcium 8.3 (L) 8.9 - 10.3 mg/dL   Total Protein 6.4 (L) 6.5 - 8.1 g/dL   Albumin 2.9 (L) 3.5 - 5.0 g/dL   AST 371 (H) 15 - 41 U/L   ALT 180 (H) 14 - 54 U/L   Alkaline Phosphatase 78 38 - 126 U/L   Total Bilirubin  0.6 0.3 - 1.2 mg/dL   GFR calc non Af Amer 41 (L) >60 mL/min   GFR calc Af Amer 48 (L) >60 mL/min    Comment: (NOTE) The eGFR has been calculated using the CKD EPI equation. This calculation has not been validated in all clinical situations. eGFR's persistently <60 mL/min signify possible Chronic Kidney Disease.    Anion gap 14 5 - 15  CBC     Status: Abnormal   Collection Time: 08/11/16  2:56 AM  Result Value Ref Range   WBC 5.2 4.0 - 10.5 K/uL   RBC 4.24 3.87 - 5.11 MIL/uL   Hemoglobin 11.9 (L) 12.0 - 15.0 g/dL   HCT 35.2 (L) 36.0 - 46.0 %   MCV 83.0 78.0 - 100.0 fL   MCH 28.1 26.0 - 34.0 pg   MCHC 33.8 30.0 - 36.0 g/dL   RDW 17.4 (H) 11.5 - 15.5 %   Platelets 239 150 - 400 K/uL  Troponin I     Status: Abnormal   Collection Time: 08/11/16  2:56 AM  Result Value Ref Range   Troponin I 0.11 (HH) <0.03 ng/mL    Comment: CRITICAL VALUE NOTED.  VALUE IS CONSISTENT WITH PREVIOUSLY REPORTED AND CALLED VALUE.  Magnesium  Status: None   Collection Time: 08/11/16  2:56 AM  Result Value Ref Range   Magnesium 2.4 1.7 - 2.4 mg/dL  Type and screen [place]     Status: None   Collection Time: 08/11/16  8:06 AM  Result Value Ref Range   ABO/RH(D) B POS    Antibody Screen NEG    Sample Expiration 08/14/2016   ABO/Rh     Status: None   Collection Time: 08/11/16  8:06 AM  Result Value Ref Range   ABO/RH(D) B POS   Troponin I     Status: Abnormal   Collection Time: 08/11/16  8:46 AM  Result Value Ref Range   Troponin I 0.09 (HH) <0.03 ng/mL    Comment: CRITICAL VALUE NOTED.  VALUE IS CONSISTENT WITH PREVIOUSLY REPORTED AND CALLED VALUE.  Protime-INR     Status: None   Collection Time: 08/11/16  8:46 AM  Result Value Ref Range   Prothrombin Time 13.3 11.4 - 15.2 seconds   INR 1.01   Ethanol     Status: None   Collection Time: 08/11/16  8:46 AM  Result Value Ref Range   Alcohol, Ethyl (B) <5 <5 mg/dL    Comment:        LOWEST DETECTABLE LIMIT FOR SERUM  ALCOHOL IS 5 mg/dL FOR MEDICAL PURPOSES ONLY   CBC     Status: Abnormal   Collection Time: 08/11/16  8:46 AM  Result Value Ref Range   WBC 6.0 4.0 - 10.5 K/uL   RBC 4.25 3.87 - 5.11 MIL/uL   Hemoglobin 11.9 (L) 12.0 - 15.0 g/dL   HCT 35.6 (L) 36.0 - 46.0 %   MCV 83.8 78.0 - 100.0 fL   MCH 28.0 26.0 - 34.0 pg   MCHC 33.4 30.0 - 36.0 g/dL   RDW 17.8 (H) 11.5 - 15.5 %   Platelets 261 150 - 400 K/uL  Ammonia     Status: Abnormal   Collection Time: 08/11/16  8:46 AM  Result Value Ref Range   Ammonia 63 (H) 9 - 35 umol/L  Valproic acid level     Status: Abnormal   Collection Time: 08/11/16  8:46 AM  Result Value Ref Range   Valproic Acid Lvl 35 (L) 50.0 - 100.0 ug/mL  Blood gas, arterial     Status: Abnormal   Collection Time: 08/11/16 10:25 AM  Result Value Ref Range   O2 Content 2.0 L/min   Delivery systems NASAL CANNULA    pH, Arterial 7.446 7.350 - 7.450   pCO2 arterial 68.0 (HH) 32.0 - 48.0 mmHg    Comment: CRITICAL RESULT CALLED TO, READ BACK BY AND VERIFIED WITH: LONNIE,RN AT 1029 BY M.SMITH,RRT,RCP ON 08/11/2016    pO2, Arterial 59.7 (L) 83.0 - 108.0 mmHg   Bicarbonate 46.1 (H) 20.0 - 28.0 mmol/L   Acid-Base Excess 20.4 (H) 0.0 - 2.0 mmol/L   O2 Saturation 87.7 %   Patient temperature 98.6    Collection site RIGHT RADIAL    Drawn by [phone]    Sample type ARTERIAL DRAW    Allens test (pass/fail) PASS PASS      Component Value Date/Time   SDES URINE, RANDOM 11/12/2014 0903   SPECREQUEST none Immunocompromised 11/12/2014 0903   CULT MULTIPLE SPECIES PRESENT, SUGGEST RECOLLECTION 11/12/2014 1017   REPTSTATUS 11/14/2014 FINAL 11/12/2014 0903   Dg Chest 2 View  Result Date: 08/10/2016 CLINICAL DATA:  Shortness of breath EXAM: CHEST  2 VIEW COMPARISON:  02/27/2016 FINDINGS: Slight  rotated to the LEFT. Enlargement of cardiac silhouette with pulmonary vascular congestion. Mediastinal contours normal. Mild central peribronchial thickening and chronic accentuation of perihilar  markings, little changed. No definite acute infiltrate, pleural effusion or pneumothorax. Bones demineralized. IMPRESSION: Enlargement of cardiac silhouette with pulmonary vascular congestion. Minimal chronic accentuation of interstitial markings, could represent chronic interstitial lung disease or minimal chronic versus recurrent interstitial edema. No segmental infiltrate. Electronically Signed   By: Lavonia Dana M.D.   On: 08/10/2016 17:40   Ct Head Wo Contrast  Result Date: 08/10/2016 CLINICAL DATA:  Altered mental status EXAM: CT HEAD WITHOUT CONTRAST TECHNIQUE: Contiguous axial images were obtained from the base of the skull through the vertex without intravenous contrast. COMPARISON:  06/07/2015 FINDINGS: Brain: No acute territorial infarction, hemorrhage or intracranial mass. Mild cortical volume loss as before. Stable ventricle size. Vascular: No hyperdense vessels.  Carotid artery calcifications. Skull: No fracture or suspicious bone lesion Sinuses/Orbits: Minimal mucosal thickening in the ethmoid and sphenoid sinuses. No acute orbital abnormality. Bilateral lens extraction. Other: None IMPRESSION: No CT evidence for acute intracranial abnormality. Mild diffuse atrophy. Electronically Signed   By: Donavan Foil M.D.   On: 08/10/2016 22:22   No results found for this or any previous visit (from the past 240 hour(s)).    08/11/2016, 11:13 AM     LOS: 1 day    Records and images were personally reviewed where available.

## 2016-08-11 NOTE — Progress Notes (Addendum)
Alerted by patient's nurse that pt has had another episode of coffee ground emesis. Patient is more alert than last night and denies abdominal pain. Has hyperactive bowel sounds. No abdominal TTP. She reports she had similar looking vomit a couple days ago and has had some runny BMs. Breathing is a little better. Do not appreciate wheezing on exam. Will start IV protonix, ordered 2 large bore IVs, type and screen, INR, continue NPO, repeat CBC, LFTs elevated (AST 371, ALT 180). Awaiting EtOH level. Will obtain ammonia level. Will consult GI this a.m.. VSS. Repeat CBC at 0230 was 11.9 compared to 11.4 on admission. Cycle BPs.   Dani GobbleHillary Kristyne Woodring, MD [facility], PGY-2

## 2016-08-11 NOTE — Consult Note (Signed)
[facility] CONSULT Reason for consult: coffee ground emesis Referring Physician: family practice teaching service  [name] is an 56 y.o. female.  HPI: 56 year old [name] with HIV disease, diabetes, COPD hepatitis B call followed by ID. She was admitted with respiratory distress and altered mental status. She has had several episodes of coffee ground material in her stools are positive for blood. When asked to see her for G.I. bleeding. Her hemoglobin has been stable at 11.9. When I come in to talk to the patient she is arousal bull put is unable to answer questions or give me any history. She continues to fall back asleep.  Past Medical History:  Diagnosis Date  . AIDS ([place]) 12-10-2007  . Aortic insufficiency   . Aortic stenosis   . Cataract   . Chronic asthmatic bronchitis ([place])   . Chronic diastolic CHF (congestive heart failure) ([place])   . COPD (chronic obstructive pulmonary disease) ([place])   . Diabetes mellitus ([place])   . Diabetes mellitus without complication ([place])    type 2 with out complication  . GERD (gastroesophageal reflux disease)   . Hepatitis B infection    followed by ID  . Herpes simplex   . History of cholecystectomy   . Pulmonary hypertension ([place])   . Rheumatic mitral valve disease   . Schizophrenia ([place])   . Seizures ([place])   . Tobacco use   . Tuberculin test reaction 11-2008 [facility]     Past Surgical History:  Procedure Laterality Date  . CHOLECYSTECTOMY    . LEG SURGERY Right    s/p accident; "steel fell on the back of my leg"  . TONSILLECTOMY      Family History  Problem Relation Age of Onset  . Cancer Sister     breast  . Diabetes Brother   . Hypertension Brother   . Cancer Mother     lung  . Heart disease Father   . Heart attack Father     Social History:  reports that she has been smoking Cigarettes.  She started smoking about 42 years ago. She has a 52.50 pack-year smoking history. She has never used smokeless tobacco. She reports  that she drinks about 2.4 oz of alcohol per week . She reports that she does not use drugs.  Allergies: No Known Allergies  Medications; Prior to Admission medications   Medication Sig Start Date End Date Taking? Authorizing Provider  acetaminophen (TYLENOL) 325 MG tablet Take 2 tablets (650 mg total) by mouth every 6 (six) hours as needed for moderate pain. 06/16/16   [name], PA-C  albuterol (PROVENTIL HFA) 108 (90 Base) MCG/ACT inhaler Inhale 2 puffs into the lungs every 6 (six) hours as needed for wheezing or shortness of breath. 07/25/16   [name], MD  albuterol (PROVENTIL) (2.5 MG/3ML) 0.083% nebulizer solution INHALE CONTENTS OF 1 VIAL USING NEBULIZER EVERY 6 HOURS AS NEEDED FOR WHEEZING 07/25/16   [name], MD  atorvastatin (LIPITOR) 10 MG tablet Take 1 tablet (10 mg total) by mouth daily. 03/22/16   [name], MD  divalproex (DEPAKOTE SPRINKLE) 125 MG capsule TAKE 4 CAPSULES (500 MG TOTAL) BY MOUTH 2 (TWO) TIMES DAILY. 06/08/15   [name], MD  furosemide (LASIX) 40 MG tablet Take 1 tablet (40 mg total) by mouth 2 (two) times daily. 08/01/16   [name], MD  metFORMIN (GLUCOPHAGE) 500 MG tablet TAKE 1 TABLET BY MOUTH 2 TIMES DAILY WITH A MEAL 12/21/15   [name]  Stoney Bang, MD  mometasone-formoterol [facility]) 200-5 MCG/ACT AERO INHALE 2 PUFFS INTO THE LUNGS TWICE DAILY 07/25/16   [name], MD  Nebulizers (CLEVER CHOICE NEBULIZER) MISC 1 application by Does not apply route every 6 (six) hours as needed. Please provide 1 nebulizer machine to use via inhalation Q6 hours prn SOB 10/27/15   [name], MD  nicotine polacrilex (EQL NICOTINE) 4 MG lozenge Take 1 lozenge (4 mg total) by mouth as needed for smoking cessation. 08/04/16   [name], MD  NORVIR 100 MG TABS tablet TAKE 1 TABLET BY MOUTH ONCE DAILY WITH PREZISTA 05/05/16   [name], MD  pantoprazole (PROTONIX) 40 MG tablet Take 1 tablet (40 mg total) by mouth daily.  12/21/15   [name], MD  potassium chloride SA (K-DUR,KLOR-CON) 20 MEQ tablet Take 1 tablet (20 mEq total) by mouth daily. 08/07/16   [name], MD  PREZISTA 800 MG tablet TAKE 1 TABLET BY MOUTH ONCE DAILY WITH BREAKFAST.  TAKE WITH NORVIR. 05/05/16   [name], MD  ranitidine (ZANTAC) 150 MG tablet Take 1 tablet (150 mg total) by mouth 2 (two) times daily. 12/31/15   [name], MD  risperiDONE (RISPERDAL M-TABS) 3 MG disintegrating tablet Take 1 tablet (3 mg total) by mouth 2 (two) times daily. 11/23/14   [name], MD  risperiDONE microspheres (RISPERDAL CONSTA) 50 MG injection Inject 50 mg into the muscle every 30 (thirty) days.     Historical Provider, MD  TRUVADA 200-300 MG tablet TAKE 1 TABLET BY MOUTH DAILY. 05/05/16   [name], MD   . atorvastatin  10 mg Oral q1800  . ipratropium-albuterol  3 mL Nebulization Q6H  . methylPREDNISolone (SOLU-MEDROL) injection  40 mg Intravenous Daily  . mometasone-formoterol  2 puff Inhalation BID  . nicotine  21 mg Transdermal Daily  . pantoprazole (PROTONIX) IV  40 mg Intravenous Q12H  . sodium chloride flush  3 mL Intravenous Q12H   PRN Meds acetaminophen, ipratropium-albuterol Results for orders placed or performed during the hospital encounter of 08/10/16 (from the past 48 hour(s))  Basic metabolic panel     Status: Abnormal   Collection Time: 08/10/16  4:46 PM  Result Value Ref Range   Sodium 121 (L) 135 - 145 mmol/L   Potassium 2.9 (L) 3.5 - 5.1 mmol/L   Chloride 72 (L) 101 - 111 mmol/L   CO2 36 (H) 22 - 32 mmol/L   Glucose, Bld 120 (H) 65 - 99 mg/dL   BUN 25 (H) 6 - 20 mg/dL   Creatinine, Ser 1.72 (H) 0.44 - 1.00 mg/dL   Calcium 7.9 (L) 8.9 - 10.3 mg/dL   GFR calc non Af Amer 32 (L) >60 mL/min   GFR calc Af Amer 37 (L) >60 mL/min    Comment: (NOTE) The eGFR has been calculated using the CKD EPI equation. This calculation has not been validated in all clinical situations. eGFR's persistently <60  mL/min signify possible Chronic Kidney Disease.    Anion gap 13 5 - 15  CBC with Differential     Status: Abnormal   Collection Time: 08/10/16  4:46 PM  Result Value Ref Range   WBC 7.2 4.0 - 10.5 K/uL   RBC 4.09 3.87 - 5.11 MIL/uL   Hemoglobin 11.4 (L) 12.0 - 15.0 g/dL   HCT 33.5 (L) 36.0 - 46.0 %   MCV 81.9 78.0 - 100.0 fL   MCH 27.9 26.0 - 34.0 pg  MCHC 34.0 30.0 - 36.0 g/dL   RDW 17.1 (H) 11.5 - 15.5 %   Platelets 248 150 - 400 K/uL   Neutrophils Relative % 84 %   Neutro Abs 6.0 1.7 - 7.7 K/uL   Lymphocytes Relative 10 %   Lymphs Abs 0.7 0.7 - 4.0 K/uL   Monocytes Relative 6 %   Monocytes Absolute 0.4 0.1 - 1.0 K/uL   Eosinophils Relative 0 %   Eosinophils Absolute 0.0 0.0 - 0.7 K/uL   Basophils Relative 0 %   Basophils Absolute 0.0 0.0 - 0.1 K/uL  Magnesium     Status: Abnormal   Collection Time: 08/10/16  4:56 PM  Result Value Ref Range   Magnesium 1.5 (L) 1.7 - 2.4 mg/dL  Troponin I     Status: Abnormal   Collection Time: 08/10/16  4:56 PM  Result Value Ref Range   Troponin I 0.11 (HH) <0.03 ng/mL    Comment: CRITICAL RESULT CALLED TO, READ BACK BY AND VERIFIED WITH: C HUGHES,RN 1930 08/10/2016 [facility]   Brain natriuretic peptide     Status: Abnormal   Collection Time: 08/10/16  6:23 PM  Result Value Ref Range   B Natriuretic Peptide 2,017.7 (H) 0.0 - 100.0 pg/mL  I-Stat arterial blood gas, ED     Status: Abnormal   Collection Time: 08/10/16 10:53 PM  Result Value Ref Range   pH, Arterial 7.314 (L) 7.350 - 7.450   pCO2 arterial 80.5 (HH) 32.0 - 48.0 mmHg   pO2, Arterial 94.0 83.0 - 108.0 mmHg   Bicarbonate 40.8 (H) 20.0 - 28.0 mmol/L   TCO2 43 0 - 100 mmol/L   O2 Saturation 96.0 %   Acid-Base Excess 11.0 (H) 0.0 - 2.0 mmol/L   Patient temperature 99.2 F    Collection site RADIAL, ALLEN'S TEST ACCEPTABLE    Drawn by RT    Sample type ARTERIAL    Comment NOTIFIED PHYSICIAN   Occult blood card to lab, stool     Status: Abnormal   Collection Time: 08/11/16  12:02 AM  Result Value Ref Range   Fecal Occult Bld POSITIVE (A) NEGATIVE  Comprehensive metabolic panel     Status: Abnormal   Collection Time: 08/11/16  2:56 AM  Result Value Ref Range   Sodium 125 (L) 135 - 145 mmol/L   Potassium 3.1 (L) 3.5 - 5.1 mmol/L   Chloride 74 (L) 101 - 111 mmol/L   CO2 37 (H) 22 - 32 mmol/L   Glucose, Bld 176 (H) 65 - 99 mg/dL   BUN 23 (H) 6 - 20 mg/dL   Creatinine, Ser 1.39 (H) 0.44 - 1.00 mg/dL   Calcium 8.3 (L) 8.9 - 10.3 mg/dL   Total Protein 6.4 (L) 6.5 - 8.1 g/dL   Albumin 2.9 (L) 3.5 - 5.0 g/dL   AST 371 (H) 15 - 41 U/L   ALT 180 (H) 14 - 54 U/L   Alkaline Phosphatase 78 38 - 126 U/L   Total Bilirubin 0.6 0.3 - 1.2 mg/dL   GFR calc non Af Amer 41 (L) >60 mL/min   GFR calc Af Amer 48 (L) >60 mL/min    Comment: (NOTE) The eGFR has been calculated using the CKD EPI equation. This calculation has not been validated in all clinical situations. eGFR's persistently <60 mL/min signify possible Chronic Kidney Disease.    Anion gap 14 5 - 15  CBC     Status: Abnormal   Collection Time: 08/11/16  2:56 AM  Result  Value Ref Range   WBC 5.2 4.0 - 10.5 K/uL   RBC 4.24 3.87 - 5.11 MIL/uL   Hemoglobin 11.9 (L) 12.0 - 15.0 g/dL   HCT 35.2 (L) 36.0 - 46.0 %   MCV 83.0 78.0 - 100.0 fL   MCH 28.1 26.0 - 34.0 pg   MCHC 33.8 30.0 - 36.0 g/dL   RDW 17.4 (H) 11.5 - 15.5 %   Platelets 239 150 - 400 K/uL  Troponin I     Status: Abnormal   Collection Time: 08/11/16  2:56 AM  Result Value Ref Range   Troponin I 0.11 (HH) <0.03 ng/mL    Comment: CRITICAL VALUE NOTED.  VALUE IS CONSISTENT WITH PREVIOUSLY REPORTED AND CALLED VALUE.  Magnesium     Status: None   Collection Time: 08/11/16  2:56 AM  Result Value Ref Range   Magnesium 2.4 1.7 - 2.4 mg/dL  Type and screen [place]     Status: None   Collection Time: 08/11/16  8:06 AM  Result Value Ref Range   ABO/RH(D) B POS    Antibody Screen NEG    Sample Expiration 08/14/2016   Troponin I      Status: Abnormal   Collection Time: 08/11/16  8:46 AM  Result Value Ref Range   Troponin I 0.09 (HH) <0.03 ng/mL    Comment: CRITICAL VALUE NOTED.  VALUE IS CONSISTENT WITH PREVIOUSLY REPORTED AND CALLED VALUE.  Protime-INR     Status: None   Collection Time: 08/11/16  8:46 AM  Result Value Ref Range   Prothrombin Time 13.3 11.4 - 15.2 seconds   INR 1.01   Ethanol     Status: None   Collection Time: 08/11/16  8:46 AM  Result Value Ref Range   Alcohol, Ethyl (B) <5 <5 mg/dL    Comment:        LOWEST DETECTABLE LIMIT FOR SERUM ALCOHOL IS 5 mg/dL FOR MEDICAL PURPOSES ONLY   CBC     Status: Abnormal   Collection Time: 08/11/16  8:46 AM  Result Value Ref Range   WBC 6.0 4.0 - 10.5 K/uL   RBC 4.25 3.87 - 5.11 MIL/uL   Hemoglobin 11.9 (L) 12.0 - 15.0 g/dL   HCT 35.6 (L) 36.0 - 46.0 %   MCV 83.8 78.0 - 100.0 fL   MCH 28.0 26.0 - 34.0 pg   MCHC 33.4 30.0 - 36.0 g/dL   RDW 17.8 (H) 11.5 - 15.5 %   Platelets 261 150 - 400 K/uL  Ammonia     Status: Abnormal   Collection Time: 08/11/16  8:46 AM  Result Value Ref Range   Ammonia 63 (H) 9 - 35 umol/L  Valproic acid level     Status: Abnormal   Collection Time: 08/11/16  8:46 AM  Result Value Ref Range   Valproic Acid Lvl 35 (L) 50.0 - 100.0 ug/mL    Dg Chest 2 View  Result Date: 08/10/2016 CLINICAL DATA:  Shortness of breath EXAM: CHEST  2 VIEW COMPARISON:  02/27/2016 FINDINGS: Slight rotated to the LEFT. Enlargement of cardiac silhouette with pulmonary vascular congestion. Mediastinal contours normal. Mild central peribronchial thickening and chronic accentuation of perihilar markings, little changed. No definite acute infiltrate, pleural effusion or pneumothorax. Bones demineralized. IMPRESSION: Enlargement of cardiac silhouette with pulmonary vascular congestion. Minimal chronic accentuation of interstitial markings, could represent chronic interstitial lung disease or minimal chronic versus recurrent interstitial edema. No segmental  infiltrate. Electronically Signed   By: Lavonia Dana  M.D.   On: 08/10/2016 17:40   Ct Head Wo Contrast  Result Date: 08/10/2016 CLINICAL DATA:  Altered mental status EXAM: CT HEAD WITHOUT CONTRAST TECHNIQUE: Contiguous axial images were obtained from the base of the skull through the vertex without intravenous contrast. COMPARISON:  06/07/2015 FINDINGS: Brain: No acute territorial infarction, hemorrhage or intracranial mass. Mild cortical volume loss as before. Stable ventricle size. Vascular: No hyperdense vessels.  Carotid artery calcifications. Skull: No fracture or suspicious bone lesion Sinuses/Orbits: Minimal mucosal thickening in the ethmoid and sphenoid sinuses. No acute orbital abnormality. Bilateral lens extraction. Other: None IMPRESSION: No CT evidence for acute intracranial abnormality. Mild diffuse atrophy. Electronically Signed   By: Donavan Foil M.D.   On: 08/10/2016 22:22               Blood pressure 114/68, pulse 71, temperature 97.8 F (36.6 C), resp. rate 18, height 4' 11"  (1.499 m), weight 69.1 kg (152 lb 6.4 oz), SpO2 98 %.  Physical exam:   General-- somnolent African-American female. Unable to remain awake ENT-- nonicteric Neck-- supple Heart-- regular rate and rhythm without murmurs are gallops Lungs-- clear Abdomen-- soft and nontender Psych-- patient barely arousal   Assessment: 1. Stool positive for FOB/question of hematemesis 2. Marked alteration in mental status. Unable to obtain any meaningful history. She does not appear to be in any condition to give consent for any elective procedures. 3. HIV disease/hepatitis B. Followed by ID  Plan: 1. Would keep her on Protonix empirically. She does not appear to be bleeding very much since her hemoglobin is 11.9 and stable. When her mental status has improved we can consider elective EGD if she is agreeable   Rohin Krejci JR,Osbaldo Mark L 08/11/2016, 10:22 AM   This note was created using voice recognition software  and minor errors may Have occurred unintentionally. Pager: [phone] If no answer or after hours call [phone]

## 2016-08-11 NOTE — Progress Notes (Signed)
Pt admitted from ED per stretcher accompanied by RN, Pt was very drowsy and lathergic  Hard to get her follow command, respond to pain and opens her eyes but easily fall back to sleep, started throwing out coffee ground emesis fortunately MD from family medicine came to review pt at the time that pt was still throwing out took sample for hemocult test came back positive on call has been informed with an order to hold scheduled heparin, neuro checks not been able to do it Q4 as scheduled since pt not able to follow command, hooked to tele monitor and CCMD has been informed, vital signs are stable still on 3l nasal cannula bed bath given and kept on bed alarm, mouth suction, skin checked has abrasion and bruises on her skin, will continue to monitor pt

## 2016-08-12 DIAGNOSIS — R319 Hematuria, unspecified: Secondary | ICD-10-CM

## 2016-08-12 DIAGNOSIS — N390 Urinary tract infection, site not specified: Secondary | ICD-10-CM

## 2016-08-12 DIAGNOSIS — I5033 Acute on chronic diastolic (congestive) heart failure: Principal | ICD-10-CM

## 2016-08-12 DIAGNOSIS — J9601 Acute respiratory failure with hypoxia: Secondary | ICD-10-CM

## 2016-08-12 LAB — COMPREHENSIVE METABOLIC PANEL
ALK PHOS: 77 U/L (ref 38–126)
ALT: 143 U/L — ABNORMAL HIGH (ref 14–54)
AST: 176 U/L — AB (ref 15–41)
Albumin: 3 g/dL — ABNORMAL LOW (ref 3.5–5.0)
Anion gap: 10 (ref 5–15)
BILIRUBIN TOTAL: 0.6 mg/dL (ref 0.3–1.2)
BUN: 17 mg/dL (ref 6–20)
CALCIUM: 8.9 mg/dL (ref 8.9–10.3)
CO2: 43 mmol/L — ABNORMAL HIGH (ref 22–32)
Chloride: 72 mmol/L — ABNORMAL LOW (ref 101–111)
Creatinine, Ser: 1.02 mg/dL — ABNORMAL HIGH (ref 0.44–1.00)
GFR calc Af Amer: >60 mL/min (ref >60)
Glucose, Bld: 101 mg/dL — ABNORMAL HIGH (ref 65–99)
POTASSIUM: 3.7 mmol/L (ref 3.5–5.1)
Sodium: 125 mmol/L — ABNORMAL LOW (ref 135–145)
TOTAL PROTEIN: 6.1 g/dL — AB (ref 6.5–8.1)

## 2016-08-12 LAB — RAPID URINE DRUG SCREEN, HOSP PERFORMED
Amphetamines: NOT DETECTED
BARBITURATES: NOT DETECTED
Benzodiazepines: NOT DETECTED
Cocaine: NOT DETECTED
Opiates: NOT DETECTED
Tetrahydrocannabinol: NOT DETECTED

## 2016-08-12 MED ORDER — RISPERIDONE 2 MG PO TBDP
3.0000 mg | ORAL_TABLET | Freq: Two times a day (BID) | ORAL | Status: DC
  Administered 2016-08-12 – 2016-08-16 (×8): 3 mg via ORAL
  Filled 2016-08-12 (×10): qty 1

## 2016-08-12 MED ORDER — DEXTROSE 5 % IV SOLN
1.0000 g | INTRAVENOUS | Status: DC
  Administered 2016-08-12 – 2016-08-14 (×3): 1 g via INTRAVENOUS
  Filled 2016-08-12 (×5): qty 10

## 2016-08-12 MED ORDER — CEPHALEXIN 500 MG PO CAPS: 500.0000 mg | ORAL_CAPSULE | Freq: Two times a day (BID) | ORAL | Status: DC

## 2016-08-12 MED ORDER — BENZTROPINE MESYLATE 0.5 MG PO TABS
0.5000 mg | ORAL_TABLET | Freq: Every day | ORAL | Status: DC
  Administered 2016-08-13 – 2016-08-16 (×4): 0.5 mg via ORAL
  Filled 2016-08-12 (×4): qty 1

## 2016-08-12 MED ORDER — POTASSIUM CHLORIDE 20 MEQ/15ML (10%) PO SOLN: 40.0000 meq | Freq: Once | ORAL | Status: DC

## 2016-08-12 MED ORDER — DIVALPROEX SODIUM ER 500 MG PO TB24
500.0000 mg | ORAL_TABLET | Freq: Every day | ORAL | Status: DC
  Administered 2016-08-13 – 2016-08-16 (×4): 500 mg via ORAL
  Filled 2016-08-12 (×5): qty 1

## 2016-08-12 MED ORDER — GUAIFENESIN-DM 100-10 MG/5ML PO SYRP
5.0000 mL | ORAL_SOLUTION | ORAL | Status: DC | PRN
  Administered 2016-08-13 – 2016-08-15 (×4): 5 mL via ORAL
  Filled 2016-08-12 (×5): qty 5

## 2016-08-12 NOTE — Progress Notes (Signed)
[facility] Progress Note  Subjective: Apparently much more alert today, no more vomiting, having mild epigastric pain but 8 eggs bacon sausage and orange juice for breakfast and tolerated it well. States she had one small stool last night that was nonbloody in appearance.  Objective: Vital signs in last 24 hours: Temp:  [98.4 F (36.9 C)-99 F (37.2 C)] 99 F (37.2 C) (05/05 0810) Pulse Rate:  [64-84] 64 (05/05 0810) Resp:  [10-17] 14 (05/05 0810) BP: (91-109)/(59-77) 91/61 (05/05 0810) SpO2:  [93 %-100 %] 98 % (05/05 0428) FiO2 (%):  [32 %] 32 % (05/04 0848) Weight:  [66 kg (145 lb 8 oz)] 66 kg (145 lb 8 oz) (05/05 0428) Weight change: 0.227 kg (8 oz)   PE: Unchanged  Lab Results: Results for orders placed or performed during the hospital encounter of 08/10/16 (from the past 24 hour(s))  Troponin I     Status: Abnormal   Collection Time: 08/11/16  8:46 AM  Result Value Ref Range   Troponin I 0.09 (HH) <0.03 ng/mL  Protime-INR     Status: None   Collection Time: 08/11/16  8:46 AM  Result Value Ref Range   Prothrombin Time 13.3 11.4 - 15.2 seconds   INR 1.01   Ethanol     Status: None   Collection Time: 08/11/16  8:46 AM  Result Value Ref Range   Alcohol, Ethyl (B) <5 <5 mg/dL  CBC     Status: Abnormal   Collection Time: 08/11/16  8:46 AM  Result Value Ref Range   WBC 6.0 4.0 - 10.5 K/uL   RBC 4.25 3.87 - 5.11 MIL/uL   Hemoglobin 11.9 (L) 12.0 - 15.0 g/dL   HCT 57.835.6 (L) 46.936.0 - 62.946.0 %   MCV 83.8 78.0 - 100.0 fL   MCH 28.0 26.0 - 34.0 pg   MCHC 33.4 30.0 - 36.0 g/dL   RDW 52.817.8 (H) 41.311.5 - 24.415.5 %   Platelets 261 150 - 400 K/uL  Ammonia     Status: Abnormal   Collection Time: 08/11/16  8:46 AM  Result Value Ref Range   Ammonia 63 (H) 9 - 35 umol/L  Valproic acid level     Status: Abnormal   Collection Time: 08/11/16  8:46 AM  Result Value Ref Range   Valproic Acid Lvl 35 (L) 50.0 - 100.0 ug/mL  Blood gas, arterial     Status: Abnormal   Collection Time:  08/11/16 10:25 AM  Result Value Ref Range   O2 Content 2.0 L/min   Delivery systems NASAL CANNULA    pH, Arterial 7.446 7.350 - 7.450   pCO2 arterial 68.0 (HH) 32.0 - 48.0 mmHg   pO2, Arterial 59.7 (L) 83.0 - 108.0 mmHg   Bicarbonate 46.1 (H) 20.0 - 28.0 mmol/L   Acid-Base Excess 20.4 (H) 0.0 - 2.0 mmol/L   O2 Saturation 87.7 %   Patient temperature 98.6    Collection site RIGHT RADIAL    Drawn by [phone]    Sample type ARTERIAL DRAW    Allens test (pass/fail) PASS PASS  MRSA PCR Screening     Status: None   Collection Time: 08/11/16 11:57 AM  Result Value Ref Range   MRSA by PCR NEGATIVE NEGATIVE  Sodium, urine, random     Status: None   Collection Time: 08/11/16 12:09 PM  Result Value Ref Range   Sodium, Ur 16 mmol/L  Creatinine, urine, random     Status: None   Collection Time: 08/11/16 12:09 PM  Result Value Ref Range   Creatinine, Urine 88.47 mg/dL  Urinalysis, Routine w reflex microscopic     Status: Abnormal   Collection Time: 08/11/16 12:09 PM  Result Value Ref Range   Color, Urine YELLOW YELLOW   APPearance CLOUDY (A) CLEAR   Specific Gravity, Urine 1.013 1.005 - 1.030   pH 5.0 5.0 - 8.0   Glucose, UA NEGATIVE NEGATIVE mg/dL   Hgb urine dipstick MODERATE (A) NEGATIVE   Bilirubin Urine NEGATIVE NEGATIVE   Ketones, ur NEGATIVE NEGATIVE mg/dL   Protein, ur 30 (A) NEGATIVE mg/dL   Nitrite NEGATIVE NEGATIVE   Leukocytes, UA LARGE (A) NEGATIVE   RBC / HPF 0-5 0 - 5 RBC/hpf   WBC, UA TOO NUMEROUS TO COUNT 0 - 5 WBC/hpf   Bacteria, UA MANY (A) NONE SEEN   Squamous Epithelial / LPF 0-5 (A) NONE SEEN   WBC Clumps PRESENT    Hyaline Casts, UA PRESENT   TSH     Status: None   Collection Time: 08/11/16  3:45 PM  Result Value Ref Range   TSH 2.160 0.350 - 4.500 uIU/mL    Studies/Results: Dg Chest 2 View  Result Date: 08/10/2016 CLINICAL DATA:  Shortness of breath EXAM: CHEST  2 VIEW COMPARISON:  02/27/2016 FINDINGS: Slight rotated to the LEFT. Enlargement of cardiac  silhouette with pulmonary vascular congestion. Mediastinal contours normal. Mild central peribronchial thickening and chronic accentuation of perihilar markings, little changed. No definite acute infiltrate, pleural effusion or pneumothorax. Bones demineralized. IMPRESSION: Enlargement of cardiac silhouette with pulmonary vascular congestion. Minimal chronic accentuation of interstitial markings, could represent chronic interstitial lung disease or minimal chronic versus recurrent interstitial edema. No segmental infiltrate. Electronically Signed   By: Ulyses Southward M.D.   On: 08/10/2016 17:40   Ct Head Wo Contrast  Result Date: 08/10/2016 CLINICAL DATA:  Altered mental status EXAM: CT HEAD WITHOUT CONTRAST TECHNIQUE: Contiguous axial images were obtained from the base of the skull through the vertex without intravenous contrast. COMPARISON:  06/07/2015 FINDINGS: Brain: No acute territorial infarction, hemorrhage or intracranial mass. Mild cortical volume loss as before. Stable ventricle size. Vascular: No hyperdense vessels.  Carotid artery calcifications. Skull: No fracture or suspicious bone lesion Sinuses/Orbits: Minimal mucosal thickening in the ethmoid and sphenoid sinuses. No acute orbital abnormality. Bilateral lens extraction. Other: None IMPRESSION: No CT evidence for acute intracranial abnormality. Mild diffuse atrophy. Electronically Signed   By: Jasmine Pang M.D.   On: 08/10/2016 22:22      Assessment: Reported hematemesis with borderline anemia, no evidence of significant bleeding in hospital so far with initial tolerance of regular diet.  Plan: Empiric PPI Monitor stools and hemoglobin Hopefully manage non-endoscopically.    Cariana Karge C 08/12/2016, 8:20 AM  Pager [phone] If no answer or after 5 PM call [phone]

## 2016-08-12 NOTE — Progress Notes (Signed)
Pharmacy consulted to start IV ceftriaxone for empiric UTI coverage. Will order ceftriaxone 1 gm IV Q 24 hours and sign off since no further dosage adjustments required.   Vinnie LevelBenjamin Carrigan Delafuente, PharmD., BCPS Clinical Pharmacist Pager [phone]

## 2016-08-12 NOTE — Progress Notes (Signed)
Progress Note  Patient Name: [name] Date of Encounter: 08/12/2016  Primary Cardiologist: Duke Salvia  Subjective   "Get me outside."  "I don't wait for you, you wait for me."  Inpatient Medications    Scheduled Meds: . arformoterol  15 mcg Nebulization BID  . budesonide (PULMICORT) nebulizer solution  0.5 mg Nebulization BID  . darunavir  800 mg Oral Q breakfast  . emtricitabine-tenofovir AF  1 tablet Oral Daily  . furosemide  40 mg Intravenous BID  . ipratropium-albuterol  3 mL Nebulization TID  . methylPREDNISolone (SOLU-MEDROL) injection  40 mg Intravenous Daily  . nicotine  21 mg Transdermal Daily  . pantoprazole (PROTONIX) IV  40 mg Intravenous Q12H  . ritonavir  100 mg Oral Q breakfast  . sodium chloride flush  3 mL Intravenous Q12H   Continuous Infusions:  PRN Meds: acetaminophen, guaiFENesin-dextromethorphan   Vital Signs    Vitals:   08/11/16 2308 08/12/16 0011 08/12/16 0428 08/12/16 0810  BP:    91/61  Pulse:  75 74 64  Resp:  17 16 14   Temp: 98.4 F (36.9 C) 98.7 F (37.1 C) 98.5 F (36.9 C) 99 F (37.2 C)  TempSrc: Oral Oral Oral Oral  SpO2:  100% 98%   Weight:   66 kg (145 lb 8 oz)   Height:        Intake/Output Summary (Last 24 hours) at 08/12/16 0908 Last data filed at 08/11/16 2100  Gross per 24 hour  Intake              840 ml  Output              630 ml  Net              210 ml   Filed Weights   08/10/16 1615 08/11/16 0205 08/12/16 0428  Weight: 65.8 kg (145 lb) 69.1 kg (152 lb 6.4 oz) 66 kg (145 lb 8 oz)    Telemetry    No events  - Personally Reviewed  ECG    n/a - Personally Reviewed  Physical Exam   GEN: Upset and yelling "Now.  Get me out now."  Patient swatted my hand away when I attempted to examine her Neck: Unable to assess Cardiac: unable to assess Respiratory: unable to assess GI: unable to assess Ext: 1+ pitting edema to mid tibia bilaterally Neuro:  unable to assess Psych: Agitated and uncooperative.      Labs    Chemistry Recent Labs Lab 08/10/16 1646 08/11/16 0256  NA 121* 125*  K 2.9* 3.1*  CL 72* 74*  CO2 36* 37*  GLUCOSE 120* 176*  BUN 25* 23*  CREATININE 1.72* 1.39*  CALCIUM 7.9* 8.3*  PROT  --  6.4*  ALBUMIN  --  2.9*  AST  --  371*  ALT  --  180*  ALKPHOS  --  78  BILITOT  --  0.6  GFRNONAA 32* 41*  GFRAA 37* 48*  ANIONGAP 13 14     Hematology Recent Labs Lab 08/10/16 1646 08/11/16 0256 08/11/16 0846  WBC 7.2 5.2 6.0  RBC 4.09 4.24 4.25  HGB 11.4* 11.9* 11.9*  HCT 33.5* 35.2* 35.6*  MCV 81.9 83.0 83.8  MCH 27.9 28.1 28.0  MCHC 34.0 33.8 33.4  RDW 17.1* 17.4* 17.8*  PLT 248 239 261    Cardiac Enzymes Recent Labs Lab 08/10/16 1656 08/11/16 0256 08/11/16 0846  TROPONINI 0.11* 0.11* 0.09*   No results for input(s): TROPIPOC  in the last 168 hours.   BNP Recent Labs Lab 08/10/16 1823  BNP 2,017.7*     DDimer No results for input(s): DDIMER in the last 168 hours.   Radiology    Dg Chest 2 View  Result Date: 08/10/2016 CLINICAL DATA:  Shortness of breath EXAM: CHEST  2 VIEW COMPARISON:  02/27/2016 FINDINGS: Slight rotated to the LEFT. Enlargement of cardiac silhouette with pulmonary vascular congestion. Mediastinal contours normal. Mild central peribronchial thickening and chronic accentuation of perihilar markings, little changed. No definite acute infiltrate, pleural effusion or pneumothorax. Bones demineralized. IMPRESSION: Enlargement of cardiac silhouette with pulmonary vascular congestion. Minimal chronic accentuation of interstitial markings, could represent chronic interstitial lung disease or minimal chronic versus recurrent interstitial edema. No segmental infiltrate. Electronically Signed   By: Ulyses SouthwardMark  Boles M.D.   On: 08/10/2016 17:40   Ct Head Wo Contrast  Result Date: 08/10/2016 CLINICAL DATA:  Altered mental status EXAM: CT HEAD WITHOUT CONTRAST TECHNIQUE: Contiguous axial images were obtained from the base of the skull through the  vertex without intravenous contrast. COMPARISON:  06/07/2015 FINDINGS: Brain: No acute territorial infarction, hemorrhage or intracranial mass. Mild cortical volume loss as before. Stable ventricle size. Vascular: No hyperdense vessels.  Carotid artery calcifications. Skull: No fracture or suspicious bone lesion Sinuses/Orbits: Minimal mucosal thickening in the ethmoid and sphenoid sinuses. No acute orbital abnormality. Bilateral lens extraction. Other: None IMPRESSION: No CT evidence for acute intracranial abnormality. Mild diffuse atrophy. Electronically Signed   By: Jasmine PangKim  Fujinaga M.D.   On: 08/10/2016 22:22    Cardiac Studies   Echo 06/30/16: Study Conclusions  - Left ventricle: The cavity size was normal. Systolic function was   normal. The estimated ejection fraction was in the range of 60%   to 65%. Wall motion was normal; there were no regional wall   motion abnormalities. Features are consistent with a pseudonormal   left ventricular filling pattern, with concomitant abnormal   relaxation and increased filling pressure (grade 2 diastolic   dysfunction). Doppler parameters are consistent with high   ventricular filling pressure. - Aortic valve: Functionally bicuspid; severely calcified leaflets.   Valve mobility was restricted. There was moderate stenosis. There   was moderate regurgitation. Mean gradient (S): 26 mm Hg. Valve   area (VTI): 1.23 cm^2. Valve area (Vmax): 1.38 cm^2. Valve area   (Vmean): 1.22 cm^2. Regurgitation pressure half-time: 385 ms. - Mitral valve: Mild focal calcification of the anterior leaflet   (medial segment(s)), with mild involvement of anterior leaflet   chords, consistent with rheumatic disease. There was mild   regurgitation. - Pulmonic valve: There was trivial regurgitation. - Pulmonary arteries: PA peak pressure: 47 mm Hg (S).   Patient Profile     [name] is a [age]F with moderate MR/MS, asthma, diabetes, HIV, asthma, schizophrenia, and tobacco  abuse admitted with acute on chronic diastolic heart failure, acute hypercarbic/hypoxic repiratory failure, and encephalopathy.    Assessment & Plan    # Acute on chronic diastolic heart failure: # Bicuspid aortic valve: # Moderate AR/AS: [name] remains volume overloaded.  She refuses labs so we cannot check her renal function or electrolytes.  Weight is down 3kg.  Continue lasix 40 mg IV bid.   # Urinary tract infection:  Will start ceftriaxone.  # Encephalopathy:  [name] is acutely agitated.  Multifactorial from hypoxia/hypercapnea, hyperammonemia, and UTI.    # Hypoxic/hypercarbic respiratory failure: Improving.  No longer somnolent.  Continues on supplemental O2, which she uses at baseline.  #  Demand ischemia: Troponin mildly elevated. Anterolateral TWI noted on EKG.  Patient unable to report whether she had chest pain.  Cath deferred given her upper GI bleed in the hospital and encephalophathy.  # Acute on chronic kidney failure: Awaiting AM labs to determine whether to adjust diuresis.  Patient refusing labs.   # Upper GI bleed: H/H stable.  Will need upper endoscopy when stable.     Signed, Chilton Si, MD  08/12/2016, 9:08 AM
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
## 2016-08-12 NOTE — Progress Notes (Signed)
Patient alert but with periods of confusion. Patient trying to get out of the bed at times, yelling at times and asking for cigarettes.. Telesitter in progress.Patient is in low bed, with yellow socks on and yellow bracelet. Educate and instructed the patient to call for assistance for safety. MD notified

## 2016-08-12 NOTE — Progress Notes (Signed)
Patient arrived in the unit accompanied by RN and NT via bed. Orientation to the unit given.Patient verbalizes understanding. 
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
## 2016-08-13 LAB — COMPREHENSIVE METABOLIC PANEL
ALBUMIN: 2.8 g/dL — AB (ref 3.5–5.0)
ALK PHOS: 76 U/L (ref 38–126)
ALT: 121 U/L — AB (ref 14–54)
ANION GAP: 10 (ref 5–15)
AST: 106 U/L — ABNORMAL HIGH (ref 15–41)
BILIRUBIN TOTAL: 0.6 mg/dL (ref 0.3–1.2)
BUN: 20 mg/dL (ref 6–20)
CHLORIDE: 73 mmol/L — AB (ref 101–111)
CO2: 43 mmol/L — ABNORMAL HIGH (ref 22–32)
Calcium: 8.9 mg/dL (ref 8.9–10.3)
Creatinine, Ser: 1.1 mg/dL — ABNORMAL HIGH (ref 0.44–1.00)
GFR calc non Af Amer: 55 mL/min — ABNORMAL LOW (ref >60)
Glucose, Bld: 147 mg/dL — ABNORMAL HIGH (ref 65–99)
POTASSIUM: 3.6 mmol/L (ref 3.5–5.1)
SODIUM: 126 mmol/L — AB (ref 135–145)
Total Protein: 5.8 g/dL — ABNORMAL LOW (ref 6.5–8.1)

## 2016-08-13 LAB — CBC
HEMATOCRIT: 35.4 % — AB (ref 36.0–46.0)
HEMOGLOBIN: 11.3 g/dL — AB (ref 12.0–15.0)
MCH: 27.2 pg (ref 26.0–34.0)
MCHC: 31.9 g/dL (ref 30.0–36.0)
MCV: 85.3 fL (ref 78.0–100.0)
Platelets: 306 10*3/uL (ref 150–400)
RBC: 4.15 MIL/uL (ref 3.87–5.11)
RDW: 17.9 % — ABNORMAL HIGH (ref 11.5–15.5)
WBC: 7.7 10*3/uL (ref 4.0–10.5)

## 2016-08-13 LAB — SODIUM, URINE, RANDOM: SODIUM UR: 30 mmol/L

## 2016-08-13 LAB — OSMOLALITY: OSMOLALITY: 277 mosm/kg (ref 275–295)

## 2016-08-13 LAB — CREATININE, URINE, RANDOM: Creatinine, Urine: 35.08 mg/dL

## 2016-08-13 MED ORDER — FUROSEMIDE 40 MG PO TABS: 40.0000 mg | ORAL_TABLET | Freq: Two times a day (BID) | ORAL | Status: DC

## 2016-08-13 MED ORDER — SODIUM BICARBONATE 650 MG PO TABS
650.0000 mg | ORAL_TABLET | Freq: Two times a day (BID) | ORAL | Status: DC
  Administered 2016-08-13 – 2016-08-16 (×6): 650 mg via ORAL
  Filled 2016-08-13 (×6): qty 1

## 2016-08-13 MED ORDER — PREDNISONE 50 MG PO TABS
50.0000 mg | ORAL_TABLET | Freq: Every day | ORAL | Status: DC
  Administered 2016-08-13 – 2016-08-16 (×5): 50 mg via ORAL
  Filled 2016-08-13 (×5): qty 1

## 2016-08-13 MED ORDER — FUROSEMIDE 40 MG PO TABS
40.0000 mg | ORAL_TABLET | Freq: Two times a day (BID) | ORAL | Status: DC
  Administered 2016-08-13 – 2016-08-16 (×6): 40 mg via ORAL
  Filled 2016-08-13 (×6): qty 1

## 2016-08-13 NOTE — Progress Notes (Signed)
Progress Note  Patient Name: [name] Date of Encounter: 08/13/2016  Primary Cardiologist: Duke Salvia  Subjective   Feeling better.  Denies chest pain or shortness of breath.  Only complaint is being cold.   Inpatient Medications    Scheduled Meds: . arformoterol  15 mcg Nebulization BID  . benztropine  0.5 mg Oral Daily  . budesonide (PULMICORT) nebulizer solution  0.5 mg Nebulization BID  . darunavir  800 mg Oral Q breakfast  . divalproex  500 mg Oral Daily  . emtricitabine-tenofovir AF  1 tablet Oral Daily  . furosemide  40 mg Intravenous BID  . ipratropium-albuterol  3 mL Nebulization TID  . methylPREDNISolone (SOLU-MEDROL) injection  40 mg Intravenous Daily  . nicotine  21 mg Transdermal Daily  . pantoprazole (PROTONIX) IV  40 mg Intravenous Q12H  . potassium chloride  40 mEq Oral Once  . risperidone  3 mg Oral BID  . ritonavir  100 mg Oral Q breakfast  . sodium chloride flush  3 mL Intravenous Q12H   Continuous Infusions: . cefTRIAXone (ROCEPHIN)  IV Stopped (08/12/16 1246)   PRN Meds: acetaminophen, guaiFENesin-dextromethorphan   Vital Signs    Vitals:   08/12/16 2129 08/12/16 2315 08/13/16 0700 08/13/16 0754  BP:  112/80 110/78   Pulse:  80 81   Resp:  18 17   Temp:  98.3 F (36.8 C) 98 F (36.7 C)   TempSrc:  Oral Oral   SpO2: 98% 99% 98% 90%  Weight:   63.5 kg (140 lb)   Height:        Intake/Output Summary (Last 24 hours) at 08/13/16 0956 Last data filed at 08/13/16 0800  Gross per 24 hour  Intake              650 ml  Output              301 ml  Net              349 ml   Filed Weights   08/11/16 0205 08/12/16 0428 08/13/16 0700  Weight: 69.1 kg (152 lb 6.4 oz) 66 kg (145 lb 8 oz) 63.5 kg (140 lb)    Telemetry    No events  - Personally Reviewed  ECG    n/a - Personally Reviewed  Physical Exam   GEN: Well-appearing.  Laying with blankets over entire body, including head Neck:Difficult to assess JVD Cardiac: RRR.  No m/r/g.   Normal S1/2 Respiratory: CTAB on anterior exam.  Patient not cooperative with listening posteriorly. GI: Soft, NT, ND.  +BS Ext: No edema.  Scars on R LE Neuro:  Non-focal. Psych: Less agitated and more cooperative.    Labs    Chemistry  Recent Labs Lab 08/11/16 0256 08/12/16 0816 08/13/16 0336  NA 125* 125* 126*  K 3.1* 3.7 3.6  CL 74* 72* 73*  CO2 37* 43* 43*  GLUCOSE 176* 101* 147*  BUN 23* 17 20  CREATININE 1.39* 1.02* 1.10*  CALCIUM 8.3* 8.9 8.9  PROT 6.4* 6.1* 5.8*  ALBUMIN 2.9* 3.0* 2.8*  AST 371* 176* 106*  ALT 180* 143* 121*  ALKPHOS 78 77 76  BILITOT 0.6 0.6 0.6  GFRNONAA 41* >60 55*  GFRAA 48* >60 >60  ANIONGAP 14 10 10      Hematology  Recent Labs Lab 08/11/16 0256 08/11/16 0846 08/13/16 0336  WBC 5.2 6.0 7.7  RBC 4.24 4.25 4.15  HGB 11.9* 11.9* 11.3*  HCT 35.2* 35.6* 35.4*  MCV  83.0 83.8 85.3  MCH 28.1 28.0 27.2  MCHC 33.8 33.4 31.9  RDW 17.4* 17.8* 17.9*  PLT 239 261 306    Cardiac Enzymes  Recent Labs Lab 08/10/16 1656 08/11/16 0256 08/11/16 0846  TROPONINI 0.11* 0.11* 0.09*   No results for input(s): TROPIPOC in the last 168 hours.   BNP  Recent Labs Lab 08/10/16 1823  BNP 2,017.7*     DDimer No results for input(s): DDIMER in the last 168 hours.   Radiology    No results found.  Cardiac Studies   Echo 06/30/16: Study Conclusions  - Left ventricle: The cavity size was normal. Systolic function was   normal. The estimated ejection fraction was in the range of 60%   to 65%. Wall motion was normal; there were no regional wall   motion abnormalities. Features are consistent with a pseudonormal   left ventricular filling pattern, with concomitant abnormal   relaxation and increased filling pressure (grade 2 diastolic   dysfunction). Doppler parameters are consistent with high   ventricular filling pressure. - Aortic valve: Functionally bicuspid; severely calcified leaflets.   Valve mobility was restricted. There was  moderate stenosis. There   was moderate regurgitation. Mean gradient (S): 26 mm Hg. Valve   area (VTI): 1.23 cm^2. Valve area (Vmax): 1.38 cm^2. Valve area   (Vmean): 1.22 cm^2. Regurgitation pressure half-time: 385 ms. - Mitral valve: Mild focal calcification of the anterior leaflet   (medial segment(s)), with mild involvement of anterior leaflet   chords, consistent with rheumatic disease. There was mild   regurgitation. - Pulmonic valve: There was trivial regurgitation. - Pulmonary arteries: PA peak pressure: 47 mm Hg (S).   Patient Profile     Ms. Tiburcio PeaHarris is a 89F with moderate MR/MS, asthma, diabetes, HIV, asthma, schizophrenia, and tobacco abuse admitted with acute on chronic diastolic heart failure, acute hypercarbic/hypoxic repiratory failure, and encephalopathy.    Assessment & Plan    # Acute on chronic diastolic heart failure: # Bicuspid aortic valve: # Moderate AR/AS: # Acute on chronic kidney failure: Volume status has improved significantly. We are unable to accurately record ins and outs due to lack of cooperation. Her weight is 63.5kg down from 69 kg.  she is laying flat in bed and comfortable. It seems that she is now euvolemic.  BUN and creatinine are starting to rise. We will transition from IV Lasix to 40 mg oral twice daily.  Continue to encourage fluid restriction to <1.5L daily.  # Urinary tract infection:  Day 2 of ceftriaxone.  Unfortunately no urine culture was obtained.   # Encephalopathy:  Significant improvement today with treatment of UTI.  # Hypoxic/hypercarbic respiratory failure: Improving.  No longer somnolent.  Continues on supplemental O2, which she uses at baseline.  # Demand ischemia: Troponin mildly elevated. Anterolateral TWI noted on EKG.  Patient denies chest pain. Cath deferred given her upper GI bleed in the hospital and lack of symptoms.  Consider outpatient stress.   # Upper GI bleed: H/H stable.  Will need upper endoscopy when stable.      Signed, Chilton Siiffany [place], MD  08/13/2016, 9:56 AM
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
## 2016-08-13 NOTE — Progress Notes (Signed)
Pt's OP therapist, Casimiro NeedleMichael. [phone], visits at bedside.  Casimiro NeedleMichael states that pt is due for her monthly injection on 5/16, pt's brother is reliable in regard to getting her to her appts. He is supportive of the SNF plan, states family has been weighing options regarding guardianship or placement. Per Casimiro NeedleMichael, pt will be seen by him while in SNF, and [facility] if she is.    Sister, Bridgette. [phone]  Main Home Number [phone]  Brother "Eddie Candlengelo"

## 2016-08-14 DIAGNOSIS — R8281 Pyuria: Secondary | ICD-10-CM

## 2016-08-14 DIAGNOSIS — B9689 Other specified bacterial agents as the cause of diseases classified elsewhere: Secondary | ICD-10-CM

## 2016-08-14 DIAGNOSIS — I509 Heart failure, unspecified: Secondary | ICD-10-CM

## 2016-08-14 DIAGNOSIS — Z21 Asymptomatic human immunodeficiency virus [HIV] infection status: Secondary | ICD-10-CM

## 2016-08-14 DIAGNOSIS — N390 Urinary tract infection, site not specified: Secondary | ICD-10-CM

## 2016-08-14 LAB — COMPREHENSIVE METABOLIC PANEL WITH GFR
ALT: 104 U/L — ABNORMAL HIGH (ref 14–54)
AST: 65 U/L — ABNORMAL HIGH (ref 15–41)
Albumin: 3 g/dL — ABNORMAL LOW (ref 3.5–5.0)
Alkaline Phosphatase: 72 U/L (ref 38–126)
Anion gap: 13 (ref 5–15)
BUN: 19 mg/dL (ref 6–20)
CO2: 40 mmol/L — ABNORMAL HIGH (ref 22–32)
Calcium: 9 mg/dL (ref 8.9–10.3)
Chloride: 76 mmol/L — ABNORMAL LOW (ref 101–111)
Creatinine, Ser: 0.88 mg/dL (ref 0.44–1.00)
GFR calc Af Amer: >60 mL/min
GFR calc non Af Amer: >60 mL/min
Glucose, Bld: 125 mg/dL — ABNORMAL HIGH (ref 65–99)
Potassium: 3.7 mmol/L (ref 3.5–5.1)
Sodium: 129 mmol/L — ABNORMAL LOW (ref 135–145)
Total Bilirubin: 0.6 mg/dL (ref 0.3–1.2)
Total Protein: 6 g/dL — ABNORMAL LOW (ref 6.5–8.1)

## 2016-08-14 LAB — CBC
HCT: 36.3 % (ref 36.0–46.0)
Hemoglobin: 12.1 g/dL (ref 12.0–15.0)
MCH: 28.3 pg (ref 26.0–34.0)
MCHC: 33.3 g/dL (ref 30.0–36.0)
MCV: 84.8 fL (ref 78.0–100.0)
Platelets: 293 10*3/uL (ref 150–400)
RBC: 4.28 MIL/uL (ref 3.87–5.11)
RDW: 18.1 % — ABNORMAL HIGH (ref 11.5–15.5)
WBC: 8.5 10*3/uL (ref 4.0–10.5)

## 2016-08-14 LAB — OSMOLALITY, URINE: Osmolality, Ur: 276 mosm/kg — ABNORMAL LOW (ref 300–900)

## 2016-08-14 MED ORDER — HALOPERIDOL LACTATE 5 MG/ML IJ SOLN
3.0000 mg | Freq: Once | INTRAMUSCULAR | Status: AC
  Administered 2016-08-15: 3 mg via INTRAVENOUS
  Filled 2016-08-14 (×2): qty 1

## 2016-08-14 NOTE — Progress Notes (Signed)
INFECTIOUS DISEASE PROGRESS NOTE  ID: [name] is a 56 y.o. female with  Active Problems:   Altered mental status   Hypomagnesemia   Elevated troponin   Acute respiratory failure with hypoxia (HCC)   Acute on chronic diastolic (congestive) heart failure (HCC)   Upper GI bleed   Urinary tract infection with hematuria  Subjective: Without complaints No SOB  Abtx:  Anti-infectives    Start     Dose/Rate Route Frequency Ordered Stop   08/12/16 1100  cefTRIAXone (ROCEPHIN) 1 g in dextrose 5 % 50 mL IVPB     1 g 100 mL/hr over 30 Minutes Intravenous Every 24 hours 08/12/16 1050     08/12/16 1015  cephALEXin (KEFLEX) capsule 500 mg  Status:  Discontinued     500 mg Oral Every 12 hours 08/12/16 1010 08/12/16 1011   08/12/16 0800  darunavir (PREZISTA) tablet 800 mg     800 mg Oral Daily with breakfast 08/11/16 1138     08/12/16 0800  ritonavir (NORVIR) tablet 100 mg     100 mg Oral Daily with breakfast 08/11/16 1138     08/11/16 1215  emtricitabine-tenofovir AF (DESCOVY) 200-25 MG per tablet 1 tablet     1 tablet Oral Daily 08/11/16 1138     08/11/16 0800  ritonavir (NORVIR) tablet 100 mg  Status:  Discontinued     100 mg Oral Daily with breakfast 08/10/16 2128 08/10/16 2212   08/11/16 0800  darunavir (PREZISTA) tablet 800 mg  Status:  Discontinued     800 mg Oral Daily with breakfast 08/10/16 2128 08/10/16 2212      Medications:  Scheduled: . arformoterol  15 mcg Nebulization BID  . benztropine  0.5 mg Oral Daily  . budesonide (PULMICORT) nebulizer solution  0.5 mg Nebulization BID  . darunavir  800 mg Oral Q breakfast  . divalproex  500 mg Oral Daily  . emtricitabine-tenofovir AF  1 tablet Oral Daily  . furosemide  40 mg Oral BID  . ipratropium-albuterol  3 mL Nebulization TID  . nicotine  21 mg Transdermal Daily  . pantoprazole (PROTONIX) IV  40 mg Intravenous Q12H  . potassium chloride  40 mEq Oral Once  . predniSONE  50 mg Oral Q breakfast  . risperidone  3 mg  Oral BID  . ritonavir  100 mg Oral Q breakfast  . sodium bicarbonate  650 mg Oral BID  . sodium chloride flush  3 mL Intravenous Q12H    Objective: Vital signs in last 24 hours: Temp:  [98.5 F (36.9 C)-99.3 F (37.4 C)] 99.3 F (37.4 C) (05/07 0502) Pulse Rate:  [66-90] 66 (05/07 0502) Resp:  [18] 18 (05/07 0502) BP: (102-138)/(61-67) 138/67 (05/07 0502) SpO2:  [90 %-100 %] 98 % (05/07 0723) Weight:  [61.3 kg (135 lb 3.2 oz)] 61.3 kg (135 lb 3.2 oz) (05/07 0502)   General appearance: alert, cooperative and no distress Resp: clear to auscultation bilaterally Cardio: regular rate and rhythm GI: normal findings: bowel sounds normal and soft, non-tender  Lab Results  Recent Labs  08/13/16 0336 08/14/16 0316  WBC 7.7 8.5  HGB 11.3* 12.1  HCT 35.4* 36.3  NA 126* 129*  K 3.6 3.7  CL 73* 76*  CO2 43* 40*  BUN 20 19  CREATININE 1.10* 0.88   Liver Panel  Recent Labs  08/13/16 0336 08/14/16 0316  PROT 5.8* 6.0*  ALBUMIN 2.8* 3.0*  AST 106* 65*  ALT 121* 104*  ALKPHOS 76  72  BILITOT 0.6 0.6   Sedimentation Rate No results for input(s): ESRSEDRATE in the last 72 hours. C-Reactive Protein No results for input(s): CRP in the last 72 hours.  Microbiology: Recent Results (from the past 240 hour(s))  MRSA PCR Screening     Status: None   Collection Time: 08/11/16 11:57 AM  Result Value Ref Range Status   MRSA by PCR NEGATIVE NEGATIVE Final    Comment:        The GeneXpert MRSA Assay (FDA approved for NASAL specimens only), is one component of a comprehensive MRSA colonization surveillance program. It is not intended to diagnose MRSA infection nor to guide or monitor treatment for MRSA infections.     Studies/Results: No results found.   Assessment/Plan: HIV+ Continue descovy, darunavir, norvir.  f/u in ID 3-4 weeks post d/c.   CHF Appears improved.  Cr normal.   Pyuria On ceftriaxone per fpts. Change to keflex?  No UCx.   Available as  needed.   Total days of antibiotics: 2 ceftriaxone         Johny SaxJeffrey Hatcher Infectious Diseases (pager) [phone] [email or web address] 08/14/2016, 10:21 AM  LOS: 4 days

## 2016-08-14 NOTE — Progress Notes (Signed)
PT Cancellation Note  Patient Details Name: Jackie AlandDawn L Norfolk MRN: [id number] DOB: 10/06/1960   Cancelled Treatment:    Reason Eval/Treat Not Completed: Patient declined first 2 attempts, reporting she was too fatigued to participate, becoming somewhat agitated with therapist's attempts to encouraged OOB.  On third attempt, pt with respiratory therapy receiving breathing treatment. Will attempt to f/u tomorrow.    Aivah Putman E Penven-Crew 08/14/2016, 3:10 PM
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
## 2016-08-14 NOTE — Progress Notes (Signed)
Progress Note  Patient Name: [name] Date of Encounter: 08/14/2016  Primary Cardiologist: Dr. Duke Salviaandolph  Subjective   No chest pain and shortness of breath. Pulling her cloths.   Inpatient Medications    Scheduled Meds: . arformoterol  15 mcg Nebulization BID  . benztropine  0.5 mg Oral Daily  . budesonide (PULMICORT) nebulizer solution  0.5 mg Nebulization BID  . darunavir  800 mg Oral Q breakfast  . divalproex  500 mg Oral Daily  . emtricitabine-tenofovir AF  1 tablet Oral Daily  . furosemide  40 mg Oral BID  . haloperidol lactate  3 mg Intravenous Once  . ipratropium-albuterol  3 mL Nebulization TID  . nicotine  21 mg Transdermal Daily  . pantoprazole (PROTONIX) IV  40 mg Intravenous Q12H  . potassium chloride  40 mEq Oral Once  . predniSONE  50 mg Oral Q breakfast  . risperidone  3 mg Oral BID  . ritonavir  100 mg Oral Q breakfast  . sodium bicarbonate  650 mg Oral BID  . sodium chloride flush  3 mL Intravenous Q12H   Continuous Infusions: . cefTRIAXone (ROCEPHIN)  IV Stopped (08/13/16 1439)   PRN Meds: acetaminophen, guaiFENesin-dextromethorphan   Vital Signs    Vitals:   08/13/16 1929 08/13/16 2010 08/14/16 0502 08/14/16 0723  BP:  104/62 138/67   Pulse:  71 66   Resp:  18 18   Temp:  99.3 F (37.4 C) 99.3 F (37.4 C)   TempSrc:  Oral Oral   SpO2: 98% 91% 98% 98%  Weight:   135 lb 3.2 oz (61.3 kg)   Height:        Intake/Output Summary (Last 24 hours) at 08/14/16 1151 Last data filed at 08/14/16 0923  Gross per 24 hour  Intake              893 ml  Output             1000 ml  Net             -107 ml   Filed Weights   08/12/16 0428 08/13/16 0700 08/14/16 0502  Weight: 145 lb 8 oz (66 kg) 140 lb (63.5 kg) 135 lb 3.2 oz (61.3 kg)    Telemetry    Sinus rhythm  - Personally Reviewed  ECG    None today   Physical Exam   GEN: No acute distress.   Neck: No JVD Cardiac: RRR, no murmurs, rubs, or gallops.  Respiratory: Clear to  auscultation bilaterally. GI: Soft, nontender, non-distended  MS: No edema; No deformity. Neuro:  Nonfocal, confused  Psych: Confused  Labs    Chemistry Recent Labs Lab 08/12/16 0816 08/13/16 0336 08/14/16 0316  NA 125* 126* 129*  K 3.7 3.6 3.7  CL 72* 73* 76*  CO2 43* 43* 40*  GLUCOSE 101* 147* 125*  BUN 17 20 19   CREATININE 1.02* 1.10* 0.88  CALCIUM 8.9 8.9 9.0  PROT 6.1* 5.8* 6.0*  ALBUMIN 3.0* 2.8* 3.0*  AST 176* 106* 65*  ALT 143* 121* 104*  ALKPHOS 77 76 72  BILITOT 0.6 0.6 0.6  GFRNONAA >60 55* >60  GFRAA >60 >60 >60  ANIONGAP 10 10 13      Hematology Recent Labs Lab 08/11/16 0846 08/13/16 0336 08/14/16 0316  WBC 6.0 7.7 8.5  RBC 4.25 4.15 4.28  HGB 11.9* 11.3* 12.1  HCT 35.6* 35.4* 36.3  MCV 83.8 85.3 84.8  MCH 28.0 27.2 28.3  MCHC 33.4 31.9  33.3  RDW 17.8* 17.9* 18.1*  PLT 261 306 293    Cardiac Enzymes Recent Labs Lab 08/10/16 1656 08/11/16 0256 08/11/16 0846  TROPONINI 0.11* 0.11* 0.09*   No results for input(s): TROPIPOC in the last 168 hours.   BNP Recent Labs Lab 08/10/16 1823  BNP 2,017.7*     DDimer No results for input(s): DDIMER in the last 168 hours.   Radiology    No results found.  Cardiac Studies   None this admission   Patient Profile     Ms. Nissan is a 80F with moderate MR/MS, asthma, diabetes, HIV, asthma, schizophrenia, and tobacco abuse admitted with acute on chronic diastolic heart failure, acute hypercarbic/hypoxic repiratory failure, and encephalopathy.   Assessment & Plan    1. Acute on chronic diastolic CHF - BNP of 2017 on admission. Initially diuresed with IV lasix now on PO. Weight down 10lb. I & O inaccurate. Scr normal. Continue current dose of lasix.   2. Mild Elevated troponin  - Flat trend. Demand ischemia insetting of acute multiple illness. No ischemic evaluation plannned this admission. EKG with changes on admission. Likely outpatient stress test.   Will sign off. Call with questions.     Signed, Manson Passey, PA  08/14/2016, 11:51 AM

## 2016-08-15 DIAGNOSIS — R197 Diarrhea, unspecified: Secondary | ICD-10-CM

## 2016-08-15 LAB — CBC
HCT: 40.2 % (ref 36.0–46.0)
Hemoglobin: 13 g/dL (ref 12.0–15.0)
MCH: 27.8 pg (ref 26.0–34.0)
MCHC: 32.3 g/dL (ref 30.0–36.0)
MCV: 86.1 fL (ref 78.0–100.0)
PLATELETS: 311 10*3/uL (ref 150–400)
RBC: 4.67 MIL/uL (ref 3.87–5.11)
RDW: 18.6 % — ABNORMAL HIGH (ref 11.5–15.5)
WBC: 16.2 10*3/uL — ABNORMAL HIGH (ref 4.0–10.5)

## 2016-08-15 LAB — C DIFFICILE QUICK SCREEN W PCR REFLEX
C Diff antigen: POSITIVE — AB
C Diff interpretation: DETECTED
C Diff toxin: POSITIVE — AB

## 2016-08-15 LAB — BASIC METABOLIC PANEL
Anion gap: 11 (ref 5–15)
BUN: 21 mg/dL — AB (ref 6–20)
CO2: 39 mmol/L — ABNORMAL HIGH (ref 22–32)
Calcium: 9.3 mg/dL (ref 8.9–10.3)
Chloride: 81 mmol/L — ABNORMAL LOW (ref 101–111)
Creatinine, Ser: 0.99 mg/dL (ref 0.44–1.00)
GFR calc Af Amer: >60 mL/min (ref >60)
GLUCOSE: 141 mg/dL — AB (ref 65–99)
POTASSIUM: 3.1 mmol/L — AB (ref 3.5–5.1)
Sodium: 131 mmol/L — ABNORMAL LOW (ref 135–145)

## 2016-08-15 MED ORDER — PANTOPRAZOLE SODIUM 40 MG PO TBEC
40.0000 mg | DELAYED_RELEASE_TABLET | Freq: Two times a day (BID) | ORAL | Status: DC
Start: 2016-08-15 — End: 2016-08-16
  Administered 2016-08-15 – 2016-08-16 (×3): 40 mg via ORAL
  Filled 2016-08-15 (×3): qty 1

## 2016-08-15 MED ORDER — LOPERAMIDE HCL 2 MG PO CAPS
4.0000 mg | ORAL_CAPSULE | Freq: Once | ORAL | Status: AC
  Administered 2016-08-15: 4 mg via ORAL
  Filled 2016-08-15: qty 2

## 2016-08-15 MED ORDER — IPRATROPIUM-ALBUTEROL 0.5-2.5 (3) MG/3ML IN SOLN: 3.0000 mL | RESPIRATORY_TRACT | Status: DC | PRN

## 2016-08-15 MED ORDER — IPRATROPIUM-ALBUTEROL 0.5-2.5 (3) MG/3ML IN SOLN
3.0000 mL | Freq: Two times a day (BID) | RESPIRATORY_TRACT | Status: DC
  Administered 2016-08-15 – 2016-08-16 (×2): 3 mL via RESPIRATORY_TRACT
  Filled 2016-08-15 (×2): qty 3

## 2016-08-15 MED ORDER — POTASSIUM CHLORIDE CRYS ER 20 MEQ PO TBCR
40.0000 meq | EXTENDED_RELEASE_TABLET | ORAL | Status: AC
  Administered 2016-08-15 (×2): 40 meq via ORAL
  Filled 2016-08-15 (×2): qty 2

## 2016-08-15 MED ORDER — VANCOMYCIN 50 MG/ML ORAL SOLUTION
125.0000 mg | Freq: Four times a day (QID) | ORAL | Status: DC
  Administered 2016-08-15 – 2016-08-16 (×3): 125 mg via ORAL
  Filled 2016-08-15 (×8): qty 2.5

## 2016-08-15 MED ORDER — LOPERAMIDE HCL 2 MG PO CAPS: 2.0000 mg | ORAL_CAPSULE | ORAL | Status: DC | PRN
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
## 2016-08-15 NOTE — Clinical Social Work Note (Signed)
PT recommending SNF. CSW called patient's sister and left a voicemail. Will discuss placement once she calls back.  Charlynn CourtSarah Orell Hurtado, CSW [phone]
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
## 2016-08-15 NOTE — Progress Notes (Signed)
Pt I alert and oriented x2. [facility] Loose st[facility] on [facility]  Antibiotics. C-Diff stool sample sent.

## 2016-08-15 NOTE — Progress Notes (Signed)
MD called back  and ordered  Imodium.

## 2016-08-15 NOTE — Progress Notes (Signed)
Patient having 6 loose stool. Paged MD. Awaiting for response.

## 2016-08-16 LAB — BASIC METABOLIC PANEL
ANION GAP: 12 (ref 5–15)
BUN: 23 mg/dL — ABNORMAL HIGH (ref 6–20)
CHLORIDE: 86 mmol/L — AB (ref 101–111)
CO2: 34 mmol/L — AB (ref 22–32)
CREATININE: 0.93 mg/dL (ref 0.44–1.00)
Calcium: 9 mg/dL (ref 8.9–10.3)
GFR calc non Af Amer: >60 mL/min (ref >60)
Glucose, Bld: 167 mg/dL — ABNORMAL HIGH (ref 65–99)
POTASSIUM: 4.4 mmol/L (ref 3.5–5.1)
Sodium: 132 mmol/L — ABNORMAL LOW (ref 135–145)

## 2016-08-16 LAB — CBC
HEMATOCRIT: 39.5 % (ref 36.0–46.0)
HEMOGLOBIN: 12.6 g/dL (ref 12.0–15.0)
MCH: 27.6 pg (ref 26.0–34.0)
MCHC: 31.9 g/dL (ref 30.0–36.0)
MCV: 86.4 fL (ref 78.0–100.0)
Platelets: 322 10*3/uL (ref 150–400)
RBC: 4.57 MIL/uL (ref 3.87–5.11)
RDW: 19.2 % — ABNORMAL HIGH (ref 11.5–15.5)
WBC: 13.7 10*3/uL — ABNORMAL HIGH (ref 4.0–10.5)

## 2016-08-16 LAB — UREA NITROGEN, URINE: UREA NITROGEN UR: 373 mg/dL

## 2016-08-16 MED ORDER — VANCOMYCIN 50 MG/ML ORAL SOLUTION: 125.0000 mg | Freq: Four times a day (QID) | ORAL | 0 refills | Status: DC

## 2016-08-16 MED ORDER — PREDNISONE 10 MG PO TABS: ORAL_TABLET | ORAL | 0 refills | Status: AC

## 2016-08-16 MED ORDER — EMTRICITABINE-TENOFOVIR AF 200-25 MG PO TABS: 1.0000 | ORAL_TABLET | Freq: Every day | ORAL | 3 refills | Status: AC

## 2016-08-16 MED ORDER — FUROSEMIDE 40 MG PO TABS: 40.0000 mg | ORAL_TABLET | Freq: Two times a day (BID) | ORAL | 2 refills | Status: AC

## 2016-08-16 MED ORDER — PANTOPRAZOLE SODIUM 40 MG PO TBEC
40.0000 mg | DELAYED_RELEASE_TABLET | Freq: Two times a day (BID) | ORAL | 3 refills | Status: AC
Start: 2016-08-16 — End: ?

## 2016-08-16 MED ORDER — PREDNISONE 20 MG PO TABS: 40.0000 mg | ORAL_TABLET | Freq: Every day | ORAL | Status: DC
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
## 2016-08-16 NOTE — Progress Notes (Signed)
CSW was consulted for transportation needs. Patients family stated they are unable to pick up patient and RN feels a can would not be appropriate due to patients altered mental status. CSW spoke with patients sister Miles Costain(Makylee Mata) and she is in agreement with PTAR to take patient home. CSW signing off as patient no longer has needs.   Marrianne MoodAshley Yuval Nolet, MSW,  [employer]WA [phone]

## 2016-08-16 NOTE — Clinical Social Work Note (Addendum)
PASARR under level II review in case sister ends up deciding she does want SNF.  Charlynn CourtSarah Ayani Ospina, CSW [phone]  12:50 pm Patient has orders to discharge home today.   CSW signing off.  Charlynn CourtSarah Beverlyn Mcginness, CSW [phone]
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
## 2016-08-16 NOTE — Progress Notes (Signed)
Transitions of Care Pharmacy Note  Plan:  Educated on oral vancomycin  Recommend reinforcing completion of antibiotic with patient  Outpatient follow-up: pick-up of abx and compliance with medications  --------------------------------------------- Levi Alandawn L Beidleman is an 56 y.o. female who presents with a chief complaint of AMS. In anticipation of discharge, pharmacy has reviewed this patient's prior to admission medication history, as well as [facility]  Current medication indications, dosing, frequency, and notable side effects reviewed with patient. patient verbalized understanding of current inpatient medication regimen and is aware that the After Visit Summary when presented, will represent the most accurate medication list at discharge.   Tymara L Mcbain did not express any concerns regarding her medications. We educated her on proper administration of her oral vancomycin and stressed the importance of finishing this regimen. The patient did not have any questions or concerns at the conclusion of our visit.   Assessment: Understanding of regimen: poor Understanding of indications: poor Potential of compliance: fair  Patient instructed to contact inpatient pharmacy team with further questions or concerns if needed.    Time spent preparing for discharge counseling: 10 min  Time spent counseling patient: 10 min    York CeriseKatherine Cook, PharmD Pharmacy Resident  Pager [phone] 08/16/16 1:26 PM

## 2016-08-17 ENCOUNTER — Encounter (HOSPITAL_COMMUNITY): Payer: Self-pay | Admitting: Emergency Medicine

## 2016-08-17 ENCOUNTER — Inpatient Hospital Stay (HOSPITAL_COMMUNITY)
Admission: EM | Admit: 2016-08-17 | Discharge: 2016-08-18 | DRG: 372 | Disposition: A | Discharge status: Skilled Nursing Facility | Payer: Medicaid Other | Attending: Family Medicine | Admitting: Family Medicine

## 2016-08-17 ENCOUNTER — Ambulatory Visit: Payer: Medicaid Other | Admitting: Cardiovascular Disease

## 2016-08-17 ENCOUNTER — Telehealth: Payer: Self-pay | Admitting: Family Medicine

## 2016-08-17 DIAGNOSIS — Z7984 Long term (current) use of oral hypoglycemic drugs: Secondary | ICD-10-CM

## 2016-08-17 DIAGNOSIS — K219 Gastro-esophageal reflux disease without esophagitis: Secondary | ICD-10-CM | POA: Diagnosis present

## 2016-08-17 DIAGNOSIS — R32 Unspecified urinary incontinence: Secondary | ICD-10-CM | POA: Diagnosis present

## 2016-08-17 DIAGNOSIS — F17210 Nicotine dependence, cigarettes, uncomplicated: Secondary | ICD-10-CM | POA: Diagnosis present

## 2016-08-17 DIAGNOSIS — I080 Rheumatic disorders of both mitral and aortic valves: Secondary | ICD-10-CM | POA: Diagnosis present

## 2016-08-17 DIAGNOSIS — E119 Type 2 diabetes mellitus without complications: Secondary | ICD-10-CM | POA: Diagnosis present

## 2016-08-17 DIAGNOSIS — E860 Dehydration: Secondary | ICD-10-CM | POA: Diagnosis present

## 2016-08-17 DIAGNOSIS — Z79899 Other long term (current) drug therapy: Secondary | ICD-10-CM | POA: Diagnosis not present

## 2016-08-17 DIAGNOSIS — Z803 Family history of malignant neoplasm of breast: Secondary | ICD-10-CM | POA: Diagnosis not present

## 2016-08-17 DIAGNOSIS — Z7951 Long term (current) use of inhaled steroids: Secondary | ICD-10-CM | POA: Diagnosis not present

## 2016-08-17 DIAGNOSIS — R4182 Altered mental status, unspecified: Secondary | ICD-10-CM | POA: Diagnosis present

## 2016-08-17 DIAGNOSIS — F209 Schizophrenia, unspecified: Secondary | ICD-10-CM | POA: Diagnosis present

## 2016-08-17 DIAGNOSIS — I272 Pulmonary hypertension, unspecified: Secondary | ICD-10-CM | POA: Diagnosis present

## 2016-08-17 DIAGNOSIS — Z833 Family history of diabetes mellitus: Secondary | ICD-10-CM | POA: Diagnosis not present

## 2016-08-17 DIAGNOSIS — E785 Hyperlipidemia, unspecified: Secondary | ICD-10-CM | POA: Diagnosis present

## 2016-08-17 DIAGNOSIS — Z21 Asymptomatic human immunodeficiency virus [HIV] infection status: Secondary | ICD-10-CM | POA: Diagnosis present

## 2016-08-17 DIAGNOSIS — I959 Hypotension, unspecified: Secondary | ICD-10-CM | POA: Diagnosis present

## 2016-08-17 DIAGNOSIS — I5032 Chronic diastolic (congestive) heart failure: Secondary | ICD-10-CM | POA: Diagnosis present

## 2016-08-17 DIAGNOSIS — B20 Human immunodeficiency virus [HIV] disease: Secondary | ICD-10-CM | POA: Diagnosis not present

## 2016-08-17 DIAGNOSIS — J449 Chronic obstructive pulmonary disease, unspecified: Secondary | ICD-10-CM | POA: Diagnosis present

## 2016-08-17 DIAGNOSIS — A0472 Enterocolitis due to Clostridium difficile, not specified as recurrent: Secondary | ICD-10-CM | POA: Diagnosis present

## 2016-08-17 DIAGNOSIS — Z8249 Family history of ischemic heart disease and other diseases of the circulatory system: Secondary | ICD-10-CM

## 2016-08-17 LAB — COMPREHENSIVE METABOLIC PANEL
ALT: 45 U/L (ref 14–54)
AST: 21 U/L (ref 15–41)
Albumin: 3.3 g/dL — ABNORMAL LOW (ref 3.5–5.0)
Alkaline Phosphatase: 65 U/L (ref 38–126)
Anion gap: 11 (ref 5–15)
BUN: 24 mg/dL — ABNORMAL HIGH (ref 6–20)
CHLORIDE: 90 mmol/L — AB (ref 101–111)
CO2: 36 mmol/L — ABNORMAL HIGH (ref 22–32)
Calcium: 8.6 mg/dL — ABNORMAL LOW (ref 8.9–10.3)
Creatinine, Ser: 1.05 mg/dL — ABNORMAL HIGH (ref 0.44–1.00)
GFR, EST NON AFRICAN AMERICAN: 58 mL/min — AB (ref >60)
Glucose, Bld: 86 mg/dL (ref 65–99)
POTASSIUM: 3.7 mmol/L (ref 3.5–5.1)
Sodium: 137 mmol/L (ref 135–145)
Total Bilirubin: 0.6 mg/dL (ref 0.3–1.2)
Total Protein: 6.1 g/dL — ABNORMAL LOW (ref 6.5–8.1)

## 2016-08-17 LAB — CBC WITH DIFFERENTIAL/PLATELET
BASOS ABS: 0 10*3/uL (ref 0.0–0.1)
Basophils Relative: 0 %
EOS ABS: 0 10*3/uL (ref 0.0–0.7)
EOS PCT: 0 %
HCT: 39.7 % (ref 36.0–46.0)
Hemoglobin: 13.1 g/dL (ref 12.0–15.0)
Lymphocytes Relative: 23 %
Lymphs Abs: 2.9 10*3/uL (ref 0.7–4.0)
MCH: 28.5 pg (ref 26.0–34.0)
MCHC: 33 g/dL (ref 30.0–36.0)
MCV: 86.5 fL (ref 78.0–100.0)
MONO ABS: 1.3 10*3/uL — AB (ref 0.1–1.0)
Monocytes Relative: 10 %
NEUTROS PCT: 67 %
Neutro Abs: 8.3 10*3/uL — ABNORMAL HIGH (ref 1.7–7.7)
PLATELETS: 322 10*3/uL (ref 150–400)
RBC: 4.59 MIL/uL (ref 3.87–5.11)
RDW: 19.9 % — AB (ref 11.5–15.5)
WBC: 12.5 10*3/uL — AB (ref 4.0–10.5)

## 2016-08-17 LAB — CBG MONITORING, ED: GLUCOSE-CAPILLARY: 81 mg/dL (ref 65–99)

## 2016-08-17 LAB — LACTIC ACID, PLASMA: Lactic Acid, Venous: 1.6 mmol/L (ref 0.5–1.9)

## 2016-08-17 LAB — I-STAT CG4 LACTIC ACID, ED: LACTIC ACID, VENOUS: 2.11 mmol/L — AB (ref 0.5–1.9)

## 2016-08-17 MED ORDER — MOMETASONE FURO-FORMOTEROL FUM 200-5 MCG/ACT IN AERO
2.0000 | INHALATION_SPRAY | Freq: Two times a day (BID) | RESPIRATORY_TRACT | Status: DC
  Administered 2016-08-17 – 2016-08-18 (×2): 2 via RESPIRATORY_TRACT
  Filled 2016-08-17: qty 8.8

## 2016-08-17 MED ORDER — IPRATROPIUM-ALBUTEROL 0.5-2.5 (3) MG/3ML IN SOLN
3.0000 mL | RESPIRATORY_TRACT | Status: DC | PRN
Start: 2016-08-17 — End: 2016-08-18

## 2016-08-17 MED ORDER — DARUNAVIR ETHANOLATE 800 MG PO TABS
800.0000 mg | ORAL_TABLET | Freq: Every day | ORAL | Status: DC
  Administered 2016-08-18: 800 mg via ORAL
  Filled 2016-08-17 (×3): qty 1

## 2016-08-17 MED ORDER — DIVALPROEX SODIUM ER 500 MG PO TB24
500.0000 mg | ORAL_TABLET | Freq: Every day | ORAL | Status: DC
  Administered 2016-08-18: 500 mg via ORAL
  Filled 2016-08-17: qty 1

## 2016-08-17 MED ORDER — NICOTINE 21 MG/24HR TD PT24
21.0000 mg | MEDICATED_PATCH | Freq: Every day | TRANSDERMAL | Status: DC
  Administered 2016-08-18: 21 mg via TRANSDERMAL
  Filled 2016-08-17: qty 1

## 2016-08-17 MED ORDER — SODIUM CHLORIDE 0.9 % IV SOLN
INTRAVENOUS | Status: AC
  Administered 2016-08-17 – 2016-08-18 (×2): via INTRAVENOUS

## 2016-08-17 MED ORDER — ACETAMINOPHEN 650 MG RE SUPP: 650.0000 mg | Freq: Four times a day (QID) | RECTAL | Status: DC | PRN

## 2016-08-17 MED ORDER — EMTRICITABINE-TENOFOVIR AF 200-25 MG PO TABS
1.0000 | ORAL_TABLET | Freq: Every day | ORAL | Status: DC
  Administered 2016-08-18: 1 via ORAL
  Filled 2016-08-17: qty 1

## 2016-08-17 MED ORDER — SODIUM CHLORIDE 0.9% FLUSH
3.0000 mL | Freq: Two times a day (BID) | INTRAVENOUS | Status: DC
  Administered 2016-08-17: 3 mL via INTRAVENOUS

## 2016-08-17 MED ORDER — ACETAMINOPHEN 325 MG PO TABS: 650.0000 mg | ORAL_TABLET | Freq: Four times a day (QID) | ORAL | Status: DC | PRN

## 2016-08-17 MED ORDER — BENZTROPINE MESYLATE 0.5 MG PO TABS
0.5000 mg | ORAL_TABLET | Freq: Every day | ORAL | Status: DC
  Administered 2016-08-18: 0.5 mg via ORAL
  Filled 2016-08-17: qty 1

## 2016-08-17 MED ORDER — PREDNISONE 20 MG PO TABS
30.0000 mg | ORAL_TABLET | Freq: Every day | ORAL | Status: DC
  Administered 2016-08-18: 09:00:00 30 mg via ORAL
  Filled 2016-08-17: qty 1

## 2016-08-17 MED ORDER — RITONAVIR 100 MG PO TABS
100.0000 mg | ORAL_TABLET | Freq: Every day | ORAL | Status: DC
  Administered 2016-08-18: 100 mg via ORAL
  Filled 2016-08-17: qty 1

## 2016-08-17 MED ORDER — SODIUM CHLORIDE 0.9 % IV BOLUS (SEPSIS)
500.0000 mL | Freq: Once | INTRAVENOUS | Status: AC
  Administered 2016-08-17: 500 mL via INTRAVENOUS

## 2016-08-17 MED ORDER — RISPERIDONE 2 MG PO TBDP
3.0000 mg | ORAL_TABLET | Freq: Two times a day (BID) | ORAL | Status: DC
  Administered 2016-08-17 – 2016-08-18 (×2): 3 mg via ORAL
  Filled 2016-08-17 (×3): qty 1

## 2016-08-17 MED ORDER — NALOXONE HCL 0.4 MG/ML IJ SOLN
0.1000 mg | Freq: Once | INTRAMUSCULAR | Status: AC
  Administered 2016-08-17: 0.1 mg via INTRAVENOUS
  Filled 2016-08-17: qty 1

## 2016-08-17 MED ORDER — ENOXAPARIN SODIUM 40 MG/0.4ML ~~LOC~~ SOLN
40.0000 mg | SUBCUTANEOUS | Status: DC
  Administered 2016-08-17: 40 mg via SUBCUTANEOUS
  Filled 2016-08-17: qty 0.4

## 2016-08-17 MED ORDER — VANCOMYCIN 50 MG/ML ORAL SOLUTION
125.0000 mg | Freq: Four times a day (QID) | ORAL | Status: DC
  Administered 2016-08-17 – 2016-08-18 (×4): 125 mg via ORAL
  Filled 2016-08-17 (×5): qty 2.5

## 2016-08-17 MED ORDER — PANTOPRAZOLE SODIUM 40 MG PO TBEC
40.0000 mg | DELAYED_RELEASE_TABLET | Freq: Every day | ORAL | Status: DC
Start: 2016-08-18 — End: 2016-08-18
  Administered 2016-08-18: 40 mg via ORAL
  Filled 2016-08-17: qty 1

## 2016-08-17 MED ORDER — ATORVASTATIN CALCIUM 10 MG PO TABS
10.0000 mg | ORAL_TABLET | Freq: Every day | ORAL | Status: DC
  Administered 2016-08-18: 10 mg via ORAL
  Filled 2016-08-17: qty 1
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
## 2016-08-17 NOTE — ED Notes (Signed)
[facility] here at this time transport pt

## 2016-08-17 NOTE — Telephone Encounter (Signed)
Spoke with sister, she states patient has fallen twice since discharge yesterday and is still having lots of confusion and has been soiling herself. States she has sent patient back to hospital because she cannot stay at home in her current condition, states [facility]. Sister would like for PCP to call her at  her home number [phone].
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
## 2016-08-17 NOTE — ED Notes (Signed)
Gave Pt a sandwich and water, okayed by Dr. Rubin PayorPickering.

## 2016-08-17 NOTE — Telephone Encounter (Signed)
I called and spoke with patient's sister [name], whom I know well as she always accompanies [name] to her appointments. In my experience, [name] has always been very appropriately concerned about [name], and she has always had [name]'s best interest at heart.  [name] reports that since being discharged yesterday, patient has fallen several times. Is also having watery diarrhea all over the house. The family is not able to care for her at home, as [name] lives with her brother who is an amputee, and [name] is not able to be on site there all the time. [name] is concerned for [name]'s safety and wellbeing at home, and believes she needs SNF.  She also clarified with me that she did not intend to actually decline SNF in this recent hospitalization. She says that she raised concerns about [name] being placed in a SNF for rehab when the greater issue is that she needs a hip replacement, but did not intend that to mean she was outright declining SNF placement.  I reviewed with [name] that due to the severity of [name]'s medical conditions, including her presentation at recent hospitalization with hypoxia and AMS, that it may be challenging for her to get medical clearance for an elective hip replacement. [name] had not realized this before. We discussed that another reason for SNF placement may be that in addition to rehab to get stronger, she may need SNF for the higher level of nursing care (with her frequent BMs from c diff, etc). [name] states they have not been able to find a pharmacy which will compound the c diff medication.  [name] I reviewed with [name] that first the ED physicians will do a medical evaluation. If she is deemed to need admission to the hospital medically, she will likely be transferred to [facility] so that our inpatient service can care for her. If she has no acute medical issues but merely needs SNF placement, the ED social workers may be able to facilitate that without  admission to the hospital. [name] was very appreciative of this call.  I have called the [facility] social work phone line and left a message with my pager number so that I can provide more background on [name] and her situation. I will also speak with our FM social worker [name], when she returns to the office this afternoon.  Please page me if I can be helpful (pager [phone]).   Latrelle DodrillBrittany J Kataya Guimont, MD

## 2016-08-17 NOTE — ED Triage Notes (Signed)
Per EMS pt from home after recently discharged yesterday, unsure of which hospital. EMS called by sister due to multiple falls and defectating on herself. Pt was found to be hypotensive, given 500 CC en route. Pt was positive for C diff, pt placed on contact precautions. Pt reports not having medications at home for c diff.

## 2016-08-17 NOTE — ED Notes (Signed)
CSW for possible SNF admission is at bedside

## 2016-08-17 NOTE — ED Provider Notes (Signed)
[facility]-EMERGENCY DEPT Provider Note   CSN: [id number] Arrival date & time: 08/17/16  1143     History   Chief Complaint Chief Complaint  Patient presents with  . Hypotension  . Diarrhea   Level V caveat due to altered mental status. HPI [name] is a 56 y.o. female.  HPI Patient presents with a history of C. difficile diarrhea. Discharge from the hospital 2 days ago. Reportedly had refused inpatient treatment. Since then she has been incontinent stool and unable to get out of bed. Mental status changes today. Found to be hypotensive for EMS. Cannot really provide much history from him. Sees a family practice Center.   Past Medical History:  Diagnosis Date  . AIDS (HCC) 12-10-2007  . Aortic insufficiency   . Aortic stenosis   . Cataract   . Chronic asthmatic bronchitis (HCC)   . Chronic diastolic CHF (congestive heart failure) (HCC)   . COPD (chronic obstructive pulmonary disease) (HCC)   . Diabetes mellitus (HCC)   . Diabetes mellitus without complication (HCC)    type 2 with out complication  . GERD (gastroesophageal reflux disease)   . Hepatitis B infection    followed by ID  . Herpes simplex   . History of cholecystectomy   . Pulmonary hypertension (HCC)   . Rheumatic mitral valve disease   . Schizophrenia (HCC)   . Seizures (HCC)   . Tobacco use   . Tuberculin test reaction 11-2008 [facility]     Patient Active Problem List   Diagnosis Date Noted  . Clostridium difficile diarrhea 08/17/2016  . Diarrhea of presumed infectious origin   . Pyuria   . Urinary tract infection with hematuria   . Hypomagnesemia   . Elevated troponin   . Acute respiratory failure with hypoxia (HCC)   . Acute on chronic diastolic (congestive) heart failure (HCC)   . Upper GI bleed   . Altered mental status 08/10/2016  . Hypokalemia 08/07/2016  . Lower extremity edema 06/21/2016  . Xerosis of skin 08/24/2015  . GERD (gastroesophageal reflux disease) 08/19/2015  . Abnormal  echocardiogram 07/24/2015  . Murmur, cardiac 06/21/2015  . Shortness of breath   . Diabetes (HCC) 12/01/2014  . HIV disease (HCC)   . Hyponatremia 10/22/2014  . Microscopic hematuria 01/29/2014  . Abdominal pain 12/12/2013  . Tobacco use disorder 02/18/2013  . Hepatitis B infection   . Pre-ulcerative corn or callous 05/07/2012  . Urinary incontinence, nocturnal enuresis 05/07/2012  . Fecal incontinence 05/07/2012  . Systolic murmur 05/07/2012  . Routine health maintenance 05/07/2012  . Asthma 08/18/2010  . HIV (human immunodeficiency virus infection) (HCC) 08/18/2010  . Schizophrenia (HCC) 08/18/2010  . Herpes simplex infection 08/18/2010    Past Surgical History:  Procedure Laterality Date  . CHOLECYSTECTOMY    . LEG SURGERY Right    s/p accident; "steel fell on the back of my leg"  . TONSILLECTOMY      OB History    No data available       Home Medications    Prior to Admission medications   Medication Sig Start Date End Date Taking? Authorizing Provider  atorvastatin (LIPITOR) 10 MG tablet Take 1 tablet (10 mg total) by mouth daily. 03/22/16   [name], MD  benztropine (COGENTIN) 0.5 MG tablet Take 0.5 mg by mouth daily.    [provider]  divalproex (DEPAKOTE ER) 500 MG 24 hr tablet Take 500 mg by mouth daily.    [provider]  emtricitabine-tenofovir AF (DESCOVY) 200-25 MG tablet Take 1 tablet by mouth daily. 08/17/16   Freddrick March, MD  furosemide (LASIX) 40 MG tablet Take 1 tablet (40 mg total) by mouth 2 (two) times daily. 08/16/16   Freddrick March, MD  gabapentin (NEURONTIN) 300 MG capsule Take 300 mg by mouth 2 (two) times daily.    [provider]  hydrOXYzine (VISTARIL) 50 MG capsule Take 50 mg by mouth daily as needed for anxiety.    [provider]  metFORMIN (GLUCOPHAGE) 500 MG tablet TAKE 1 TABLET BY MOUTH 2 TIMES DAILY WITH A MEAL 12/21/15   [name], MD  mometasone-formoterol [facility]) 200-5  MCG/ACT AERO INHALE 2 PUFFS INTO THE LUNGS TWICE DAILY 07/25/16   [name], MD  nicotine polacrilex (EQL NICOTINE) 4 MG lozenge Take 1 lozenge (4 mg total) by mouth as needed for smoking cessation. 08/04/16   Uvaldo Rising, MD  NORVIR 100 MG TABS tablet TAKE 1 TABLET BY MOUTH ONCE DAILY WITH PREZISTA 05/05/16   Judyann Munson, MD  paliperidone (INVEGA SUSTENNA) 234 MG/1.5ML SUSP injection Inject 234 mg into the muscle every 30 (thirty) days.    [provider]  pantoprazole (PROTONIX) 40 MG tablet Take 1 tablet (40 mg total) by mouth 2 (two) times daily. 08/16/16   Freddrick March, MD  predniSONE (DELTASONE) 10 MG tablet Take as follows:  4 tabs x2 days (5/9,5/10), then 3 tabs x2 days (5/11,5/12), then 2 tabs x2 days (5/13,5/14) then 1 tab x2 days (5/15,5/16) 08/16/16   Freddrick March, MD  PREZISTA 800 MG tablet TAKE 1 TABLET BY MOUTH ONCE DAILY WITH BREAKFAST.  TAKE WITH NORVIR. 05/05/16   Judyann Munson, MD  risperiDONE (RISPERDAL M-TABS) 3 MG disintegrating tablet Take 1 tablet (3 mg total) by mouth 2 (two) times daily. 11/23/14   Pucilowska, Ellin Goodie, MD  vancomycin (VANCOCIN) 50 mg/mL oral solution Take 2.5 mLs (125 mg total) by mouth 4 (four) times daily. 08/16/16   Freddrick March, MD    Family History Family History  Problem Relation Age of Onset  . Cancer Sister        breast  . Diabetes Brother   . Hypertension Brother   . Cancer Mother        lung  . Heart disease Father   . Heart attack Father     Social History Social History  Substance Use Topics  . Smoking status: Current Every Day Smoker    Packs/day: 1.50    Years: 35.00    Types: Cigarettes    Start date: 04/10/1974  . Smokeless tobacco: Never Used     Comment: Previously smoked 2ppd, might start patch  . Alcohol use 2.4 oz/week    3 Cans of beer, 1 Standard drinks or equivalent per week     Comment: beer occas     Allergies   Patient has no known allergies.   Review of Systems Review of Systems    Unable to perform ROS: Mental status change     Physical Exam Updated Vital Signs BP 98/74   Pulse 68   Temp 98.2 F (36.8 C) (Oral)   Resp 12   Ht 4\' 11"  (1.499 m)   Wt 136 lb (61.7 kg)   SpO2 90%   BMI 27.47 kg/m   Physical Exam  Constitutional: She appears well-developed.  HENT:  Head: Atraumatic.  Eyes:  Pupils are somewhat constricted.  Neck: Neck supple.  Cardiovascular: Normal rate.   Pulmonary/Chest: Effort normal.  Abdominal: There is no tenderness.  Musculoskeletal: She exhibits no edema.  Neurological:  Initial mental status decrease. Minimally verbal. Mental status has improved somewhat after IV fluid bolus.  Skin: Skin is warm. Capillary refill takes less than 2 seconds.     ED Treatments / Results  Labs (all labs ordered are listed, but only abnormal results are displayed) Labs Reviewed  CBC WITH DIFFERENTIAL/PLATELET - Abnormal; Notable for the following:       Result Value   WBC 12.5 (*)    RDW 19.9 (*)    Neutro Abs 8.3 (*)    Monocytes Absolute 1.3 (*)    All other components within normal limits  COMPREHENSIVE METABOLIC PANEL - Abnormal; Notable for the following:    Chloride 90 (*)    CO2 36 (*)    BUN 24 (*)    Creatinine, Ser 1.05 (*)    Calcium 8.6 (*)    Total Protein 6.1 (*)    Albumin 3.3 (*)    GFR calc non Af Amer 58 (*)    All other components within normal limits  I-STAT CG4 LACTIC ACID, ED - Abnormal; Notable for the following:    Lactic Acid, Venous 2.11 (*)    All other components within normal limits  URINALYSIS, ROUTINE W REFLEX MICROSCOPIC  CBG MONITORING, ED    EKG  EKG Interpretation None       Radiology No results found.  Procedures Procedures (including critical care time)  Medications Ordered in ED Medications  naloxone (NARCAN) injection 0.1 mg (0.1 mg Intravenous Given 08/17/16 1405)  sodium chloride 0.9 % bolus 500 mL (0 mLs Intravenous Stopped 08/17/16 1505)     Initial Impression /  Assessment and Plan / ED Course  I have reviewed the triage vital signs and the nursing notes.  Pertinent labs & imaging results that were available during my care of the patient were reviewed by me and considered in my medical decision making (see chart for details).     Patient with C. difficile diarrhea. Discharge from hospital yesterday and was not able to get all the medicines either. Has been incontinent of stool at home. Had decreased mental status. Has improved with IV fluids somewhat. Discussed with family practice and [facility] for further treatment and likely placement as the patient will now except placement.  Final Clinical Impressions(s) / ED Diagnoses   Final diagnoses:  Clostridium difficile diarrhea  Dehydration    New Prescriptions New Prescriptions   No medications on file     Benjiman CorePickering, Nena Hampe, MD 08/17/16 1610

## 2016-08-17 NOTE — Telephone Encounter (Signed)
Pt was discharged yesterday from the hospital with CDIFF and pt is defecating on herself, getting it all over the floor, the walls, everywhere. Sister is very upset, doesn't understand why pt was discharged, she doesn't know what to do. States it is an emergency and needs to talk to Dr. Memory DanceMcIntyre ASAPl. Please call sister [phone] ep

## 2016-08-17 NOTE — ED Notes (Signed)
ABNORMAL LAB RESULT MD PICKERING AND RN HAVE BEEN MADE AWARE

## 2016-08-17 NOTE — Progress Notes (Signed)
CSW received call from Sammuel Hineseborah Moore social worker with [facility]. Patient was discharged [facility]. Patient and family declined SNF at the time of discharge due to "needing a knee replacement". Patient discharged home via [facility]. CSW will follow up with RN and EDP for any needs.   Stacy GardnerErin Theona Muhs, LCSWA Clinical Social Worker [phone]

## 2016-08-17 NOTE — ED Notes (Signed)
Pt aware a urine sample is needed. She is refusing to use a bedpan.

## 2016-08-17 NOTE — ED Notes (Signed)
Bed: WA07 Expected date:  Expected time:  Means of arrival:  Comments: EMS, c-diff

## 2016-08-18 MED ORDER — VANCOMYCIN 50 MG/ML ORAL SOLUTION: 125.0000 mg | Freq: Four times a day (QID) | ORAL | 0 refills | Status: AC

## 2016-08-18 MED ORDER — ONDANSETRON 4 MG PO TBDP
4.0000 mg | ORAL_TABLET | Freq: Three times a day (TID) | ORAL | Status: DC | PRN
  Administered 2016-08-18: 4 mg via ORAL
  Filled 2016-08-18: qty 1

## 2016-08-18 MED ORDER — ONDANSETRON 4 MG PO TBDP: 4.0000 mg | ORAL_TABLET | Freq: Three times a day (TID) | ORAL | 0 refills | Status: AC | PRN

## 2016-08-18 NOTE — Progress Notes (Signed)
   08/18/16 1100  Clinical Encounter Type  Visited With Patient and family together;Patient not available  Visit Type Follow-up;Spiritual support  Referral From Chaplain;Nurse  Consult/Referral To Chaplain  Spiritual Encounters  Spiritual Needs Literature;Emotional  Stress Factors  Patient Stress Factors Family relationships;Health changes;Lack of knowledge  Family Stress Factors Family relationships;Health changes;Lack of knowledge  Advance Directives (For Healthcare)  Does Patient Have a Medical Advance Directive? No    Patient lacks capacity. Sister is Artist, met with family and nurse. Patient has a son in [place] and family will attempt to locate him and determine if he wishes to be Media planner. Cannot do HCPOA because patient lacks capacity. Sister also supports brother, who is also an amputee. Family is looking at SNF alternatives. Patient will do well with activities to occupy her hands and mind as she gets restless. Provided education PS:UGAY of kin, ministry of presence, emotional support, and meditation. Once next of kin is established they will be the primary decision makers for patient's medical care.    Kaysha Parsell L. Volanda Napoleon, MDiv

## 2016-08-18 NOTE — Clinical Social Work Placement (Signed)
   CLINICAL SOCIAL WORK PLACEMENT  NOTE 08/18/16 - DISCHARG[facility]  Date:  08/18/2016  Patient Details  Name: Jackie AlandDawn L Shoe MRN: [id number] Date of Birth: 10/24/1960  Clinical Social Work is seeking post-discharge placement for this patient at the Skilled  Nursing Facility level of care (*CSW will initial, date and re-position this form in  chart as items are completed):  No   Patient/family provided with [facility] Clinical Social Work Department's list of facilities offering this level of care within the geographic area requested by the patient (or if unable, by the patient's family).  Yes   Patient/family informed of their freedom to choose among providers that offer the needed level of care, that participate in Medicare, Medicaid or managed care program needed by the patient, have an available bed and are willing to accept the patient.  No   Patient/family informed of [facility]'s ownership interest in [facility] and [facility], as well as of the fact that they are under no obligation to receive care at these facilities.  PASRR submitted to EDS on       PASRR number received on 08/18/16     Existing PASRR number confirmed on       FL2 transmitted to all facilities in geographic area requested by pt/family on 08/18/16     FL2 transmitted to all facilities within larger geographic area on       Patient informed that his/her managed care company has contracts with or will negotiate with certain facilities, including the following:        Yes   Patient/family informed of bed offers received.  [facility]     Physician recommends and patient chooses bed at      [facility] on 08/18/16.  Patient to be transferred to facility by ambulance     Patient family notified on 08/18/16 of transfer.  Name of family member notified:  Eligah EastBridget Baggerly - [phone]      PHYSICIAN       Additional Comment:    _______________________________________________ Cristobal Goldmannrawford, Mannie Wineland Bradley, LCSW 08/18/2016, 6:07 PM

## 2016-08-18 NOTE — Progress Notes (Signed)
   08/18/16 0700  Clinical Encounter Type  Visited With Patient  Visit Type Initial;Spiritual support  Referral From Physician  Consult/Referral To Chaplain  Spiritual Encounters  Spiritual Needs Emotional  Stress Factors  Patient Stress Factors Health changes    Educational materials left with patient. Provided emotional support and ministry of presence. Briony Parveen L. Salomon FickBanks, MDiv

## 2016-08-18 NOTE — Evaluation (Signed)
Occupational Therapy Evaluation Patient Details Name: [name] MRN: [id number] DOB: 05/19/1960 Today's Date: 08/18/2016    History of Present Illness 56 y.o. female presenting with c difficile colitis . PMH is significant for schizophrenia, COPD, HFpEF, HIV, T2DM, HLD, tobacco abuse.   Clinical Impression   Pt reports she was independent with ADL PTA. Currently pt requires min-mod assist for ADL and functional mobility. Pt impulsive with movement and gets easily frustrated/agitated when tasks take too long putting her at a high safety and fall risk at this time. Recommending SNF for follow up to maximize independence and safety with ADL and functional mobility prior to return home. Pt would benefit from continued skilled OT to address established goals.    Follow Up Recommendations  SNF;Supervision/Assistance - 24 hour    Equipment Recommendations  Other (comment) (TBD at next venue)    Recommendations for Other Services       Precautions / Restrictions Precautions Precautions: Fall Restrictions Weight Bearing Restrictions: No      Mobility Bed Mobility Overal bed mobility: Needs Assistance Bed Mobility: Supine to Sit     Supine to sit: Min assist     General bed mobility comments: Min assist for trunk elevation to sitting. Cues throughout for initiation and safety  Transfers Overall transfer level: Needs assistance Equipment used: Rolling walker (2 wheeled) Transfers: Sit to/from Stand Sit to Stand: Min assist         General transfer comment: Min assist for balance. Pt with forward flexed posture; does not stand up straight even with cues    Balance Overall balance assessment: Needs assistance Sitting-balance support: Feet supported;No upper extremity supported Sitting balance-Leahy Scale: Fair     Standing balance support: Single extremity supported;During functional activity Standing balance-Leahy Scale: Poor Standing balance comment: Min assist for  balance during peri care                           ADL either performed or assessed with clinical judgement   ADL Overall ADL's : Needs assistance/impaired Eating/Feeding: Set up;Sitting   Grooming: Standing;Wash/dry hands;Moderate assistance Grooming Details (indicate cue type and reason): For balance and for soap Upper Body Bathing: Minimal assistance;Sitting   Lower Body Bathing: Moderate assistance;Sit to/from stand Lower Body Bathing Details (indicate cue type and reason): Assist for standing balance and for bathing task Upper Body Dressing : Min guard;Sitting   Lower Body Dressing: Moderate assistance;Sit to/from stand   Toilet Transfer: Moderate assistance;Ambulation;RW Toilet Transfer Details (indicate cue type and reason): Simulated by sit to stand from EOB with ambulation in room Toileting- Clothing Manipulation and Hygiene: Minimal assistance Toileting - Clothing Manipulation Details (indicate cue type and reason): in standing for peri care     Functional mobility during ADLs: Moderate assistance;Rolling walker;Cueing for safety General ADL Comments: Pt impulsive and moves quickly with mobility. Gets easily frustrated when tasks are taking too long but agreeable to particiapte. Pt with incontinent BM during session; assisted pt in cleaning.     Vision         Perception     Praxis      Pertinent Vitals/Pain Pain Assessment: Faces Faces Pain Scale: Hurts little more Pain Location: lower abdomen Pain Descriptors / Indicators: Aching Pain Intervention(s): Monitored during session;Repositioned     Hand Dominance Right   Extremity/Trunk Assessment Upper Extremity Assessment Upper Extremity Assessment: Generalized weakness   Lower Extremity Assessment Lower Extremity Assessment: Defer to PT evaluation   Cervical /  Trunk Assessment Cervical / Trunk Assessment: Kyphotic   Communication Communication Communication: No difficulties   Cognition  Arousal/Alertness: Awake/alert Behavior During Therapy: Impulsive;Agitated Overall Cognitive Status: No family/caregiver present to determine baseline cognitive functioning                                 General Comments: Pt gets intermittently agitated when activities are taking too long. She is agreeable to participate though; follows commands appropriately.   General Comments       Exercises     Shoulder Instructions      Home Living Family/patient expects to be discharged to:: Private residence Living Arrangements: Other relatives;Other (Comment) (brother and sister) Available Help at Discharge: Family;Available 24 hours/day Type of Home: House Home Access: Level entry     Home Layout: One level     Bathroom Shower/Tub: Tub/shower unit;Curtain   Firefighter: Standard     Home Equipment: Environmental consultant - 2 wheels;Cane - single point          Prior Functioning/Environment Level of Independence: Needs assistance  Gait / Transfers Assistance Needed: Pt reports using RW to get around home and uses walker ADL's / Homemaking Assistance Needed: reports getting dressed and bathing herself without assistance            OT Problem List: Decreased strength;Decreased activity tolerance;Impaired balance (sitting and/or standing);Decreased cognition;Decreased safety awareness;Decreased knowledge of use of DME or AE;Decreased knowledge of precautions;Pain      OT Treatment/Interventions: Self-care/ADL training;Therapeutic exercise;Energy conservation;DME and/or AE instruction;Therapeutic activities;Patient/family education;Balance training    OT Goals(Current goals can be found in the care plan section) Acute Rehab OT Goals Patient Stated Goal: None stated OT Goal Formulation: With patient Time For Goal Achievement: 09/01/16 Potential to Achieve Goals: Fair ADL Goals Pt Will Perform Grooming: with supervision;standing (x3 tasks) Pt Will Perform Lower Body  Bathing: with supervision;sit to/from stand Pt Will Perform Lower Body Dressing: with supervision;sit to/from stand Pt Will Transfer to Toilet: with supervision;ambulating;regular height toilet Pt Will Perform Toileting - Clothing Manipulation and hygiene: with supervision;sit to/from stand  OT Frequency: Min 2X/week   Barriers to D/C:            Co-evaluation PT/OT/SLP Co-Evaluation/Treatment: Yes Reason for Co-Treatment: For patient/therapist safety   OT goals addressed during session: ADL's and self-care      AM-PAC PT "6 Clicks" Daily Activity     Outcome Measure Help from another person eating meals?: None Help from another person taking care of personal grooming?: A Lot Help from another person toileting, which includes using toliet, bedpan, or urinal?: A Lot Help from another person bathing (including washing, rinsing, drying)?: A Lot Help from another person to put on and taking off regular upper body clothing?: A Little Help from another person to put on and taking off regular lower body clothing?: A Lot 6 Click Score: 15   End of Session Equipment Utilized During Treatment: Rolling walker Nurse Communication: Mobility status;Other (comment) (pt needs chair alarm, incontinent BM)  Activity Tolerance: Patient tolerated treatment well Patient left: in chair;with call bell/phone within reach  OT Visit Diagnosis: Unsteadiness on feet (R26.81);Other abnormalities of gait and mobility (R26.89);Muscle weakness (generalized) (M62.81)                Time: 1610-9604 OT Time Calculation (min): 22 min Charges:  OT General Charges $OT Visit: 1 Procedure OT Evaluation $OT Eval Moderate Complexity: 1 Procedure G-Codes:  Catarino Vold A. Brett Albino, M.S., OTR/L Pager: [phone]  Gaye Alken 08/18/2016, 10:40 AM

## 2016-08-18 NOTE — Progress Notes (Signed)
[name] to be D/C'd Rehab per MD order.  Discussed prescriptions and follow up appointments with the patient. Prescriptions given to patient, medication list explained in detail. Called [facility] and gave report to RN. [facility]charge [facility] given to [name].  Allergies as of 08/18/2016   No Known Allergies     Medication List    TAKE these medications   atorvastatin 10 MG tablet Commonly known as:  LIPITOR Take 1 tablet (10 mg total) by mouth daily.   benztropine 0.5 MG tablet Commonly known as:  COGENTIN Take 0.5 mg by mouth daily.   divalproex 500 MG 24 hr tablet Commonly known as:  DEPAKOTE ER Take 500 mg by mouth daily.   emtricitabine-tenofovir AF 200-25 MG tablet Commonly known as:  DESCOVY Take 1 tablet by mouth daily.   furosemide 40 MG tablet Commonly known as:  LASIX Take 1 tablet (40 mg total) by mouth 2 (two) times daily.   gabapentin 300 MG capsule Commonly known as:  NEURONTIN Take 300 mg by mouth 2 (two) times daily.   hydrOXYzine 50 MG capsule Commonly known as:  VISTARIL Take 50 mg by mouth daily as needed for anxiety.   INVEGA SUSTENNA 234 MG/1.5ML Susp injection Generic drug:  paliperidone Inject 234 mg into the muscle every 30 (thirty) days.   metFORMIN 500 MG tablet Commonly known as:  GLUCOPHAGE TAKE 1 TABLET BY MOUTH 2 TIMES DAILY WITH A MEAL   mometasone-formoterol 200-5 MCG/ACT Aero Commonly known as:  DULERA INHALE 2 PUFFS INTO THE LUNGS TWICE DAILY   NICOTINE TD Place 12 mg onto the skin daily.   NORVIR 100 MG Tabs tablet Generic drug:  ritonavir TAKE 1 TABLET BY MOUTH ONCE DAILY WITH PREZISTA   ondansetron 4 MG disintegrating tablet Commonly known as:  ZOFRAN-ODT Take 1 tablet (4 mg total) by mouth every 8 (eight) hours as needed for nausea or vomiting.   pantoprazole 40 MG tablet Commonly known as:  PROTONIX Take 1 tablet (40 mg total) by mouth 2 (two) times daily.   predniSONE 10 MG tablet Commonly known  as:  DELTASONE Take as follows:  4 tabs x2 days (5/9,5/10), then 3 tabs x2 days (5/11,5/12), then 2 tabs x2 days (5/13,5/14) then 1 tab x2 days (5/15,5/16)   PREZISTA 800 MG tablet Generic drug:  darunavir TAKE 1 TABLET BY MOUTH ONCE DAILY WITH BREAKFAST.  TAKE WITH NORVIR.   risperidone 3 MG disintegrating tablet Commonly known as:  RISPERDAL M-TABS Take 1 tablet (3 mg total) by mouth 2 (two) times daily.   vancomycin 50 mg/mL oral solution Commonly known as:  VANCOCIN Take 2.5 mLs (125 mg total) by mouth 4 (four) times daily.       Vitals:   08/18/16 0500 08/18/16 1701  BP: 110/68 115/66  Pulse: 66 91  Resp: 16 17  Temp: 98.6 F (37 C) 98.6 F (37 C)    Skin clean, dry and intact without evidence of skin break down, no evidence of skin tears noted. IV catheter discontinued intact. Site without signs and symptoms of complications. Dressing and pressure applied. Pt denies pain at this time. No complaints noted.  An After Visit Summary was printed and given to the patient. Patient escorted via strethcer, and D/C via [facility]   Jackie RothmanNatalie Shaundra Fullam, RN [facility] Phone [phone]

## 2016-08-18 NOTE — Discharge Summary (Signed)
[facility] Discharge Summary  Patient name: [name] Medical record number: [id number] Date of birth: [birth year] Age: 56 y.o. Gender: female Date of Admission: 08/17/2016  Date of Discharge: 08/18/16  Admitting Physician: Moses Manners, MD  Primary Care Provider: [name], MD Consultants: none  Indication for Hospitalization: diarrhea  Discharge Diagnoses/Problem List:  Patient Active Problem List   Diagnosis Date Noted  . Clostridium difficile diarrhea 08/17/2016  . C. difficile colitis 08/17/2016  . Dehydration   . Diarrhea of presumed infectious origin   . Pyuria   . Urinary tract infection with hematuria   . Hypomagnesemia   . Elevated troponin   . Acute respiratory failure with hypoxia (HCC)   . Chronic diastolic congestive heart failure (HCC)   . Upper GI bleed   . Altered mental status 08/10/2016  . Hypokalemia 08/07/2016  . Lower extremity edema 06/21/2016  . Xerosis of skin 08/24/2015  . GERD (gastroesophageal reflux disease) 08/19/2015  . Abnormal echocardiogram 07/24/2015  . Murmur, cardiac 06/21/2015  . Shortness of breath   . Diabetes (HCC) 12/01/2014  . HIV disease (HCC)   . Hyponatremia 10/22/2014  . Microscopic hematuria 01/29/2014  . Abdominal pain 12/12/2013  . Tobacco use disorder 02/18/2013  . Hepatitis B infection   . Pre-ulcerative corn or callous 05/07/2012  . Urinary incontinence, nocturnal enuresis 05/07/2012  . Fecal incontinence 05/07/2012  . Systolic murmur 05/07/2012  . Routine health maintenance 05/07/2012  . Asthma 08/18/2010  . HIV (human immunodeficiency virus infection) (HCC) 08/18/2010  . Schizophrenia (HCC) 08/18/2010  . Herpes simplex infection 08/18/2010    Disposition: SNF  Discharge Condition: stable  Discharge Exam: see progress note from day of discharge  Brief Hospital Course:  Patient presented to ED 1 day after discharge from recent admission with acute hypercapnic  respiratory failure, altered mental status, HIV, and found to have C diff. Patient and family were unable to pick up PO vancomycin for C diff, and diarrhea recurred at home. Patient was admitted for resumption of PO vancomycin and fluid repletion.   Issues for Follow Up:  1. Needs continued PO vancomycin with end date of 5/22 for C diff.  2.   Patient with AKI on this recent prior admission.  Recheck serum Cr.   3.   Consider elective EGD as outpatient per GI recommendations.  4.   HIV medications adjusted this hospitalization.  Make sure she is taking these as she has had issues with noncompliance in the past. Patient to follow with ID outpatient in 3-4 weeks.  5.   Consider outpatient stress test, per cardiology recommendations 6.  C diff positive.  Patient treated with oral vancomycin and discharged with antibiotics.  To complete total course of 14 days. 7. Patient discharged on Prednisone taper.  Please make sure she is taking this correctly.  To take 30 mg daily x 2 days, then 20 mg daily x 2 days, etc.   Significant Procedures: none  Significant Labs and Imaging:   Recent Labs Lab 08/15/16 0412 08/16/16 0345 08/17/16 1335  WBC 16.2* 13.7* 12.5*  HGB 13.0 12.6 13.1  HCT 40.2 39.5 39.7  PLT 311 322 322    Recent Labs Lab 08/12/16 0816 08/13/16 0336 08/14/16 0316 08/15/16 0412 08/16/16 0345 08/17/16 1447  NA 125* 126* 129* 131* 132* 137  K 3.7 3.6 3.7 3.1* 4.4 3.7  CL 72* 73* 76* 81* 86* 90*  CO2 43* 43* 40* 39* 34* 36*  GLUCOSE 101*  147* 125* 141* 167* 86  BUN 17 20 19  21* 23* 24*  CREATININE 1.02* 1.10* 0.88 0.99 0.93 1.05*  CALCIUM 8.9 8.9 9.0 9.3 9.0 8.6*  ALKPHOS 77 76 72  --   --  65  AST 176* 106* 65*  --   --  21  ALT 143* 121* 104*  --   --  45  ALBUMIN 3.0* 2.8* 3.0*  --   --  3.3*    Results/Tests Pending at Time of Discharge: none  Discharge Medications:  Allergies as of 08/18/2016   No Known Allergies     Medication List    TAKE these  medications   atorvastatin 10 MG tablet Commonly known as:  LIPITOR Take 1 tablet (10 mg total) by mouth daily.   benztropine 0.5 MG tablet Commonly known as:  COGENTIN Take 0.5 mg by mouth daily.   divalproex 500 MG 24 hr tablet Commonly known as:  DEPAKOTE ER Take 500 mg by mouth daily.   emtricitabine-tenofovir AF 200-25 MG tablet Commonly known as:  DESCOVY Take 1 tablet by mouth daily.   furosemide 40 MG tablet Commonly known as:  LASIX Take 1 tablet (40 mg total) by mouth 2 (two) times daily.   gabapentin 300 MG capsule Commonly known as:  NEURONTIN Take 300 mg by mouth 2 (two) times daily.   hydrOXYzine 50 MG capsule Commonly known as:  VISTARIL Take 50 mg by mouth daily as needed for anxiety.   INVEGA SUSTENNA 234 MG/1.5ML Susp injection Generic drug:  paliperidone Inject 234 mg into the muscle every 30 (thirty) days.   metFORMIN 500 MG tablet Commonly known as:  GLUCOPHAGE TAKE 1 TABLET BY MOUTH 2 TIMES DAILY WITH A MEAL   mometasone-formoterol 200-5 MCG/ACT Aero Commonly known as:  DULERA INHALE 2 PUFFS INTO THE LUNGS TWICE DAILY   NICOTINE TD Place 12 mg onto the skin daily.   NORVIR 100 MG Tabs tablet Generic drug:  ritonavir TAKE 1 TABLET BY MOUTH ONCE DAILY WITH PREZISTA   pantoprazole 40 MG tablet Commonly known as:  PROTONIX Take 1 tablet (40 mg total) by mouth 2 (two) times daily.   predniSONE 10 MG tablet Commonly known as:  DELTASONE Take as follows:  4 tabs x2 days (5/9,5/10), then 3 tabs x2 days (5/11,5/12), then 2 tabs x2 days (5/13,5/14) then 1 tab x2 days (5/15,5/16)   PREZISTA 800 MG tablet Generic drug:  darunavir TAKE 1 TABLET BY MOUTH ONCE DAILY WITH BREAKFAST.  TAKE WITH NORVIR.   risperidone 3 MG disintegrating tablet Commonly known as:  RISPERDAL M-TABS Take 1 tablet (3 mg total) by mouth 2 (two) times daily.   vancomycin 50 mg/mL oral solution Commonly known as:  VANCOCIN Take 2.5 mLs (125 mg total) by mouth 4 (four)  times daily.       Discharge Instructions: Please refer to Patient Instructions section of EMR for full details.  Patient was counseled important signs and symptoms that should prompt return to medical care, changes in medications, dietary instructions, activity restrictions, and follow up appointments.   Follow-Up Appointments: With PCP after SNF stay.   Garth Bignessimberlake, Kathryn, MD 08/18/2016, 3:36 PM PGY-1, [facility]
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
## 2016-08-18 NOTE — Clinical Social Work Note (Signed)
Clinical Social Work Assessment  Patient Details  Name: [name] MRN: [id number] Date of Birth: 06/11/1960  Date of referral:  08/17/16               Reason for consult:  Facility Placement                Permission sought to share information with:  Family Supports Permission granted to share information::  Yes, Verbal Permission Granted  Name::     [name]  Agency::     Relationship::  Sister  Contact Information:  [phone]-goes to work at 5 pm. [phone] (mobile)  Housing/Transportation Living arrangements for the past 2 months:  Single Family Home Source of Information:  Other (Comment Required) (Sister [name]) Patient Interpreter Needed:  None Criminal Activity/Legal Involvement Pertinent to Current Situation/Hospitalization:  No - Comment as needed Significant Relationships:  Siblings (Sister [name] and brother [name]) Lives with:  Siblings (Brother [name]) Do you feel safe going back to the place where you live?  No (Sister indicated that patient can no longer care for herself) Need for family participation in patient care:  Yes (Comment)  Care giving concerns:  Daughter reported that patient's ability to care for herself has declined within the last year and she feels facility placement is best for her sister at this time.  Social Worker assessment / plan:  CSW talked with patient's sister, [name] by phone regarding discharge planning and recommendation of ST rehab. Sister in agreement, and indicated that it is not safe for her sister to go home as she falls a lot due to osteo-arthritis. [name] reported that her sister needs hip surgery and her brother [name] is an amputee and can't care for their sister.   [name] reported that she goes to see her siblings daily and [name]'s ability to care for herself has declined.  Sister reported that the patient used to be able to function, but her physical and mental status has  changed within the last year and she can see it in how she dresses and cares for herself.   CSW talked with [name] regarding facility placement process and provided facility responses and [facility] chosen.  Employment status:  Disabled (Comment on whether or not currently receiving Disability) Insurance information:  [place]icaid [place] PT Recommendations:  Skilled Nursing Facility Information / Referral to community resources:  Other (Comment Required) (Sister provided with facility responses by phone)  Patient/Family's Response to care:  No concerns expressed regarding patient's care during hospitalization.  Patient/Family's Understanding of and Emotional Response to Diagnosis, Current Treatment, and Prognosis: Sister [name] feels that patient will probably need long-term placement due to her declining ability to properly care for herself.  Emotional Assessment Appearance:  Other (Comment Required (Did not visit with patient - only oriented to self and place) Attitude/Demeanor/Rapport:  Unable to Assess Affect (typically observed):  Unable to Assess Orientation:  Oriented to Self, Oriented to Place Alcohol / Substance use:  Tobacco Use, Alcohol Use, Illicit Drugs (Patient reported that she smokes and drinks beer and does not use illicit drugs) Psych involvement (Current and /or in the community):     Discharge Needs  Concerns to be addressed:  Discharge Planning Concerns Readmission within the last 30 days:  No Current discharge risk:  None Barriers to Discharge:  No Barriers Identified   [name], [name], LCSW 08/18/2016, 5:57 PM

## 2016-08-18 NOTE — NC FL2 (Signed)
[place] MEDICAID FL2 LEVEL OF CARE SCREENING TOOL     IDENTIFICATION  Patient Name: [name] Birthdate: 1960-10-27 Sex: female Admission Date (Current Location): 08/17/2016  [place] and [place] Number:  [address] [id number] Facility and Address:  [facility] [place], [address] [place], [place] [address]      Provider Number:    Attending Physician Name and Address:  Carney Living, MD  Relative Name and Phone Number:  Latrece, Nitta - [phone]     Current Level of Care: Hospital Recommended Level of Care: Skilled Nursing Facility Prior Approval Number:    Date Approved/Denied:   PASRR Number: [id number] (Eff. 08/18/16 - 10/17/16)  Discharge Plan: SNF    Current Diagnoses: Patient Active Problem List   Diagnosis Date Noted  . Clostridium difficile diarrhea 08/17/2016  . C. difficile colitis 08/17/2016  . Dehydration   . Diarrhea of presumed infectious origin   . Pyuria   . Urinary tract infection with hematuria   . Hypomagnesemia   . Elevated troponin   . Acute respiratory failure with hypoxia (HCC)   . Chronic diastolic congestive heart failure (HCC)   . Upper GI bleed   . Altered mental status 08/10/2016  . Hypokalemia 08/07/2016  . Lower extremity edema 06/21/2016  . Xerosis of skin 08/24/2015  . GERD (gastroesophageal reflux disease) 08/19/2015  . Abnormal echocardiogram 07/24/2015  . Murmur, cardiac 06/21/2015  . Shortness of breath   . Diabetes (HCC) 12/01/2014  . HIV disease (HCC)   . Hyponatremia 10/22/2014  . Microscopic hematuria 01/29/2014  . Abdominal pain 12/12/2013  . Tobacco use disorder 02/18/2013  . Hepatitis B infection   . Pre-ulcerative corn or callous 05/07/2012  . Urinary incontinence, nocturnal enuresis 05/07/2012  . Fecal incontinence 05/07/2012  . Systolic murmur 05/07/2012  . Routine health maintenance 05/07/2012  . Asthma 08/18/2010  . HIV (human immunodeficiency virus  infection) (HCC) 08/18/2010  . Schizophrenia (HCC) 08/18/2010  . Herpes simplex infection 08/18/2010    Orientation RESPIRATION BLADDER Height & Weight     Self, Place  Normal Incontinent Weight: 141 lb 12.1 oz (64.3 kg) Height:  4\' 11"  (149.9 cm)  BEHAVIORAL SYMPTOMS/MOOD NEUROLOGICAL BOWEL NUTRITION STATUS    Convulsions/Seizures (History of seizures) Incontinent Diet (Regular)  AMBULATORY STATUS COMMUNICATION OF NEEDS Skin   Limited Assist Verbally Other (Comment) (Ecchymosis right/left chest and arm)                       Personal Care Assistance Level of Assistance  Bathing, Feeding, Dressing Bathing Assistance: Limited assistance Feeding assistance: Independent (assistance with set-up) Dressing Assistance: Limited assistance     Functional Limitations Info  Sight, Hearing, Speech Sight Info: Adequate Hearing Info: Adequate Speech Info: Adequate    SPECIAL CARE FACTORS FREQUENCY  PT (By licensed PT), OT (By licensed OT)     PT Frequency: Evaluated 5/11 and a minimum of 2X per week therapy recommended OT Frequency: Evaluated 5/11 and a minimum of 2X per week therapy recommended            Contractures Contractures Info: Not present    Additional Factors Info  Code Status, Allergies Code Status Info: Full Allergies Info: No known allergies           Current Medications (08/18/2016):  This is the current hospital active medication list Current Facility-Administered Medications  Medication Dose Route Frequency Provider Last Rate Last Dose  . acetaminophen (TYLENOL) tablet 650 mg  650  mg Oral Q6H PRN Howard PouchFeng, Lauren, MD       Or  . acetaminophen (TYLENOL) suppository 650 mg  650 mg Rectal Q6H PRN Howard PouchFeng, Lauren, MD      . atorvastatin (LIPITOR) tablet 10 mg  10 mg Oral q1800 Howard PouchFeng, Lauren, MD      . benztropine (COGENTIN) tablet 0.5 mg  0.5 mg Oral Daily Howard PouchFeng, Lauren, MD   0.5 mg at 08/18/16 1010  . darunavir (PREZISTA) tablet 800 mg  800 mg Oral Q breakfast  Howard PouchFeng, Lauren, MD   800 mg at 08/18/16 0855  . divalproex (DEPAKOTE ER) 24 hr tablet 500 mg  500 mg Oral Daily Howard PouchFeng, Lauren, MD   500 mg at 08/18/16 1009  . emtricitabine-tenofovir AF (DESCOVY) 200-25 MG per tablet 1 tablet  1 tablet Oral Daily Howard PouchFeng, Lauren, MD   1 tablet at 08/18/16 1010  . enoxaparin (LOVENOX) injection 40 mg  40 mg Subcutaneous Q24H Howard PouchFeng, Lauren, MD   40 mg at 08/17/16 2328  . ipratropium-albuterol (DUONEB) 0.5-2.5 (3) MG/3ML nebulizer solution 3 mL  3 mL Nebulization Q4H PRN Howard PouchFeng, Lauren, MD      . mometasone-formoterol [facility](DULERA) 200-5 MCG/ACT inhaler 2 puff  2 puff Inhalation BID Howard PouchFeng, Lauren, MD   2 puff at 08/18/16 0836  . nicotine (NICODERM CQ - dosed in mg/24 hours) patch 21 mg  21 mg Transdermal Daily Howard PouchFeng, Lauren, MD   21 mg at 08/18/16 1009  . pantoprazole (PROTONIX) EC tablet 40 mg  40 mg Oral Daily Howard PouchFeng, Lauren, MD   40 mg at 08/18/16 1009  . predniSONE (DELTASONE) tablet 30 mg  30 mg Oral Q breakfast Howard PouchFeng, Lauren, MD   30 mg at 08/18/16 0835  . risperiDONE (RISPERDAL M-TABS) disintegrating tablet 3 mg  3 mg Oral BID Howard PouchFeng, Lauren, MD   3 mg at 08/18/16 1009  . ritonavir (NORVIR) tablet 100 mg  100 mg Oral Q breakfast Howard PouchFeng, Lauren, MD   100 mg at 08/18/16 0835  . sodium chloride flush (NS) 0.9 % injection 3 mL  3 mL Intravenous Q12H Howard PouchFeng, Lauren, MD   3 mL at 08/17/16 2329  . vancomycin (VANCOCIN) 50 mg/mL oral solution 125 mg  125 mg Oral QID Howard PouchFeng, Lauren, MD   125 mg at 08/18/16 1010     Discharge Medications: Please see discharge summary for a list of discharge medications.  Relevant Imaging Results:  Relevant Lab Results:   Additional Information ss#[id number].  Cristobal Goldmannrawford, Kamaree Berkel Bradley, LCSW

## 2016-08-23 ENCOUNTER — Inpatient Hospital Stay: Payer: Medicaid Other | Admitting: Family Medicine

## 2016-08-23 ENCOUNTER — Telehealth: Payer: Self-pay | Admitting: Family Medicine
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
## 2016-09-12 ENCOUNTER — Ambulatory Visit: Payer: Self-pay | Admitting: Internal Medicine

## 2016-10-04 DIAGNOSIS — I214 Non-ST elevation (NSTEMI) myocardial infarction: Secondary | ICD-10-CM | POA: Diagnosis not present

## 2016-10-04 DIAGNOSIS — B20 Human immunodeficiency virus [HIV] disease: Secondary | ICD-10-CM | POA: Diagnosis not present

## 2016-10-04 DIAGNOSIS — J9601 Acute respiratory failure with hypoxia: Secondary | ICD-10-CM | POA: Diagnosis not present

## 2016-10-04 DIAGNOSIS — G92 Toxic encephalopathy: Secondary | ICD-10-CM

## 2016-10-04 DIAGNOSIS — J181 Lobar pneumonia, unspecified organism: Secondary | ICD-10-CM

## 2016-10-05 DIAGNOSIS — B20 Human immunodeficiency virus [HIV] disease: Secondary | ICD-10-CM | POA: Diagnosis not present

## 2016-10-05 DIAGNOSIS — J9601 Acute respiratory failure with hypoxia: Secondary | ICD-10-CM | POA: Diagnosis not present

## 2016-10-05 DIAGNOSIS — G92 Toxic encephalopathy: Secondary | ICD-10-CM | POA: Diagnosis not present

## 2016-10-05 DIAGNOSIS — I214 Non-ST elevation (NSTEMI) myocardial infarction: Secondary | ICD-10-CM | POA: Diagnosis not present

## 2016-10-06 DIAGNOSIS — I214 Non-ST elevation (NSTEMI) myocardial infarction: Secondary | ICD-10-CM | POA: Diagnosis not present

## 2016-10-06 DIAGNOSIS — G92 Toxic encephalopathy: Secondary | ICD-10-CM | POA: Diagnosis not present

## 2016-10-06 DIAGNOSIS — B20 Human immunodeficiency virus [HIV] disease: Secondary | ICD-10-CM | POA: Diagnosis not present

## 2016-10-06 DIAGNOSIS — J9601 Acute respiratory failure with hypoxia: Secondary | ICD-10-CM | POA: Diagnosis not present

## 2016-10-07 DIAGNOSIS — I214 Non-ST elevation (NSTEMI) myocardial infarction: Secondary | ICD-10-CM | POA: Diagnosis not present

## 2016-10-07 DIAGNOSIS — B20 Human immunodeficiency virus [HIV] disease: Secondary | ICD-10-CM | POA: Diagnosis not present

## 2016-10-07 DIAGNOSIS — G92 Toxic encephalopathy: Secondary | ICD-10-CM | POA: Diagnosis not present

## 2016-10-07 DIAGNOSIS — J9601 Acute respiratory failure with hypoxia: Secondary | ICD-10-CM | POA: Diagnosis not present

## 2016-10-08 DIAGNOSIS — G92 Toxic encephalopathy: Secondary | ICD-10-CM | POA: Diagnosis not present

## 2016-10-08 DIAGNOSIS — I214 Non-ST elevation (NSTEMI) myocardial infarction: Secondary | ICD-10-CM | POA: Diagnosis not present

## 2016-10-08 DIAGNOSIS — J9601 Acute respiratory failure with hypoxia: Secondary | ICD-10-CM | POA: Diagnosis not present

## 2016-10-08 DIAGNOSIS — B20 Human immunodeficiency virus [HIV] disease: Secondary | ICD-10-CM | POA: Diagnosis not present

## 2016-10-09 DIAGNOSIS — J9601 Acute respiratory failure with hypoxia: Secondary | ICD-10-CM | POA: Diagnosis not present

## 2016-10-09 DIAGNOSIS — I214 Non-ST elevation (NSTEMI) myocardial infarction: Secondary | ICD-10-CM | POA: Diagnosis not present

## 2016-10-09 DIAGNOSIS — G92 Toxic encephalopathy: Secondary | ICD-10-CM | POA: Diagnosis not present

## 2016-10-09 DIAGNOSIS — B20 Human immunodeficiency virus [HIV] disease: Secondary | ICD-10-CM | POA: Diagnosis not present

## 2016-10-23 NOTE — Progress Notes (Deleted)
Cardiology Office Note   Date:  10/23/2016   ID:  [name], DOB 09/09/1960, MRN [id number]  PCP:  Latrelle DodrillMcIntyre, Brittany J, MD  Cardiologist:   Chilton Siiffany Greenfield, MD   No chief complaint on file.     History of Present Illness: [name] is a 56 y.o. female with asthma, diabetes, HIV, asthma, schizophrenia, and tobacco abuse who presents for an evaluation of lower extremity edema.  [name] saw Dr. Levert FeinsteinBrittany McIntyre 06/2016 and reported lower extremity edema.  She was referred for lower extremity Dopplers 06/30/16 that were negative for DVT.  She also had an echo the same day that revealed LVEF 60--65% with grade 2 diastolic dysfunction.  Her aortic valve was functionally bicuspid and there was moderate aortic stenosis was a mean gradient of 26 mmHg and moderate aortic regurgitation.  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 [name] reports that the swelling started approximately 2 months ago. Her lipids are very tight and it makes it hard for her to walk. She endorses orthopnea and notes that she sometimes wheezes when she tries to lay down. She denies chest pain, lightheadedness, or dizziness. She has gained 12 pounds in the last 2 months. She notes that she drinks constantly and has several liters of fluids daily. She reports that her father had a heart attack at age 57.  [name] has been smoking one and a half packs of cigarettes daily for the last 40 years. She's tried to quit smoking using patches but has been unsuccessful.   After her last appointment [name] was admitted 08/2016 with acute on chronic diastolic heart failure, acute hypercarbic/hypoxic repiratory failure, and encephalopathy.  She was found to have C. Diff. That hospitalization was complicated by upper GI bleed and mildly elevated troponin.  There was a plan to consider outpatient ECG and stress testing once stable.    ?outpatient stress  Past Medical  History:  Diagnosis Date  . AIDS (HCC) 12-10-2007  . Aortic insufficiency   . Aortic stenosis   . Cataract   . Chronic asthmatic bronchitis (HCC)   . Chronic diastolic CHF (congestive heart failure) (HCC)   . COPD (chronic obstructive pulmonary disease) (HCC)   . Diabetes mellitus (HCC)   . Diabetes mellitus without complication (HCC)    type 2 with out complication  . GERD (gastroesophageal reflux disease)   . Hepatitis B infection    followed by ID  . Herpes simplex   . History of cholecystectomy   . Pulmonary hypertension (HCC)   . Rheumatic mitral valve disease   . Schizophrenia (HCC)   . Seizures (HCC)   . Tobacco use   . Tuberculin test reaction 11-2008 [facility]     Past Surgical History:  Procedure Laterality Date  . CHOLECYSTECTOMY    . LEG SURGERY Right    s/p accident; "steel fell on the back of my leg"  . TONSILLECTOMY       Current Outpatient Prescriptions  Medication Sig Dispense Refill  . atorvastatin (LIPITOR) 10 MG tablet Take 1 tablet (10 mg total) by mouth daily. 90 tablet 3  . benztropine (COGENTIN) 0.5 MG tablet Take 0.5 mg by mouth daily.    . divalproex (DEPAKOTE ER) 500 MG 24 hr tablet Take 500 mg by mouth daily.    Marland Kitchen. emtricitabine-tenofovir AF (DESCOVY) 200-25 MG tablet Take 1 tablet by mouth daily. 30 tablet 3  . furosemide (LASIX) 40 MG tablet  Take 1 tablet (40 mg total) by mouth 2 (two) times daily. 30 tablet 2  . gabapentin (NEURONTIN) 300 MG capsule Take 300 mg by mouth 2 (two) times daily.    . hydrOXYzine (VISTARIL) 50 MG capsule Take 50 mg by mouth daily as needed for anxiety.    . metFORMIN (GLUCOPHAGE) 500 MG tablet TAKE 1 TABLET BY MOUTH 2 TIMES DAILY WITH A MEAL 180 tablet 3  . mometasone-formoterol (DULERA) 200-5 MCG/ACT AERO INHALE 2 PUFFS INTO THE LUNGS TWICE DAILY 13 g 5  . NICOTINE TD Place 12 mg onto the skin daily.    . NORVIR 100 MG TABS tablet TAKE 1 TABLET BY MOUTH ONCE DAILY WITH PREZISTA 30 tablet 6  . ondansetron (ZOFRAN-ODT) 4  MG disintegrating tablet Take 1 tablet (4 mg total) by mouth every 8 (eight) hours as needed for nausea or vomiting. 20 tablet 0  . paliperidone (INVEGA SUSTENNA) 234 MG/1.5ML SUSP injection Inject 234 mg into the muscle every 30 (thirty) days.    . pantoprazole (PROTONIX) 40 MG tablet Take 1 tablet (40 mg total) by mouth 2 (two) times daily. 60 tablet 3  . predniSONE (DELTASONE) 10 MG tablet Take as follows:  4 tabs x2 days (5/9,5/10), then 3 tabs x2 days (5/11,5/12), then 2 tabs x2 days (5/13,5/14) then 1 tab x2 days (5/15,5/16) 20 tablet 0  . PREZISTA 800 MG tablet TAKE 1 TABLET BY MOUTH ONCE DAILY WITH BREAKFAST.  TAKE WITH NORVIR. 30 tablet 6  . risperiDONE (RISPERDAL M-TABS) 3 MG disintegrating tablet Take 1 tablet (3 mg total) by mouth 2 (two) times daily. 60 tablet 0   No current facility-administered medications for this visit.     Allergies:   Patient has no known allergies.    Social History:  The patient  reports that she has been smoking Cigarettes.  She started smoking about 42 years ago. She has a 52.50 pack-year smoking history. She has never used smokeless tobacco. She reports that she drinks about 2.4 oz of alcohol per week . She reports that she does not use drugs.   Family History:  The patient's family history includes Cancer in her mother and sister; Diabetes in her brother; Heart attack in her father; Heart disease in her father; Hypertension in her brother.    ROS:  Please see the history of present illness.   Otherwise, review of systems are positive for depression.   All other systems are reviewed and negative.    PHYSICAL EXAM: VS:  There were no vitals taken for this visit. , BMI There is no height or weight on file to calculate BMI. GENERAL:  Well appearing.  Tearful HEENT:  Pupils equal round and reactive, fundi not visualized, oral mucosa unremarkable NECK:  JVP 2 cm above the clavicle at 45 degrees, waveform within normal limits, carotid upstroke brisk and  symmetric, no bruits, no thyromegaly LYMPHATICS:  No cervical adenopathy LUNGS:  Clear to auscultation bilaterally HEART:  RRR.  PMI not displaced or sustained,S1 and S2 within normal limits, no S3, no S4, no clicks, no rubs,III/VI mid-peaking systolic murmur at the LUSB ABD:  Flat, positive bowel sounds normal in frequency in pitch, no bruits, no rebound, no guarding, no midline pulsatile mass, no hepatomegaly, no splenomegaly EXT:  2 plus pulses throughout, 2+ pitting edema to the knee bilaterally, no cyanosis no clubbing SKIN:  No rashes no nodules NEURO:  Cranial nerves II through XII grossly intact, motor grossly intact throughout PSYCH:  Cognitively intact, oriented  to person place and time    EKG:  EKG is ordered today. The ekg ordered today demonstrates sinus rhtyhm rate 70 bpm.    Echo 06/30/16: Study Conclusions  - Left ventricle: The cavity size was normal. Systolic function was   normal. The estimated ejection fraction was in the range of 60%   to 65%. Wall motion was normal; there were no regional wall   motion abnormalities. Features are consistent with a pseudonormal   left ventricular filling pattern, with concomitant abnormal   relaxation and increased filling pressure (grade 2 diastolic   dysfunction). Doppler parameters are consistent with high   ventricular filling pressure. - Aortic valve: Functionally bicuspid; severely calcified leaflets.   Valve mobility was restricted. There was moderate stenosis. There   was moderate regurgitation. Mean gradient (S): 26 mm Hg. Valve   area (VTI): 1.23 cm^2. Valve area (Vmax): 1.38 cm^2. Valve area   (Vmean): 1.22 cm^2. Regurgitation pressure half-time: 385 ms. - Mitral valve: Mild focal calcification of the anterior leaflet   (medial segment(s)), with mild involvement of anterior leaflet   chords, consistent with rheumatic disease. There was mild   regurgitation. - Pulmonic valve: There was trivial regurgitation. -  Pulmonary arteries: PA peak pressure: 47 mm Hg (S).  Impressions:  - The right ventricular systolic pressure was increased consistent   with moderate pulmonary hypertension.  Recent Labs: 08/10/2016: B Natriuretic Peptide 2,017.7 08/11/2016: Magnesium 2.4; TSH 2.160 08/17/2016: ALT 45; BUN 24; Creatinine, Ser 1.05; Hemoglobin 13.1; Platelets 322; Potassium 3.7; Sodium 137    Lipid Panel    Component Value Date/Time   CHOL 154 06/08/2016 1110   TRIG 96 06/08/2016 1110   HDL 58 06/08/2016 1110   CHOLHDL 2.7 06/08/2016 1110   VLDL 19 06/08/2016 1110   LDLCALC 77 06/08/2016 1110      Wt Readings from Last 3 Encounters:  08/17/16 64.3 kg (141 lb 12.1 oz)  08/16/16 62.6 kg (138 lb)  08/04/16 66 kg (145 lb 9.6 oz)      ASSESSMENT AND PLAN:  # Acute on chronic diastolic heart failure:  [name] is clearly volume overloaded.  Increase Lasix to 40 mg twice daily. I've asked her to start wearing compression stockings and to limit her fluid intake to 1-1/2 L. She is also to limit her salt intake to 2 g.  # Moderate aortic stenosis: # Bicuspid aortic valve: # Rheumatic mitral stenosis: Lasix as above.   # Hypertension: Blood pressure is above given that we are increasing her Lasix we will not make any other changes at this time.goal.    # Tobacco abuse: [name] was encouraged to quit smoking.  We discussed using number patches. We discussed smoking cessation for 5 minutes.   # Hyperlipidemia:  Continue atorvastatin.   Current medicines are reviewed at length with the patient today.  The patient does not have concerns regarding medicines.  The following changes have been made:  Increase lasix to 40 mg bid  Labs/ tests ordered today include:  No orders of the defined types were placed in this encounter.    Disposition:   FU with Aireana Ryland C. Duke Salvia, MD, [facility] in 2 weeks.    This note was written with the assistance of speech recognition software.  Please excuse any  transcriptional errors.  Signed, Zaria Taha C. Duke Salvia, MD, [facility]  10/23/2016 10:16 PM    [facility]

## 2016-10-24 ENCOUNTER — Encounter: Payer: Self-pay | Admitting: *Deleted

## 2016-10-24 ENCOUNTER — Ambulatory Visit: Payer: Medicaid Other | Admitting: Cardiovascular Disease

## 2016-11-08 DEATH — deceased

## 2016-11-23 IMAGING — CT CT HEAD W/O CM
2 series · 15 of 30 positions shown, 19 images · non-contrast
Comparison: CT of the head performed 09/11/2009

CLINICAL DATA: Medical clearance.  Initial encounter.

EXAM:
CT HEAD WITHOUT CONTRAST
TECHNIQUE: Contiguous axial images were obtained from the base of the skull
through the vertex without intravenous contrast.

[Series 2: head w/o · axial · non-contrast · 0.45mm/px · z∈[-144,-24]mm · 13 of 28 slices shown, 17 images]
[im 2/28  brain]
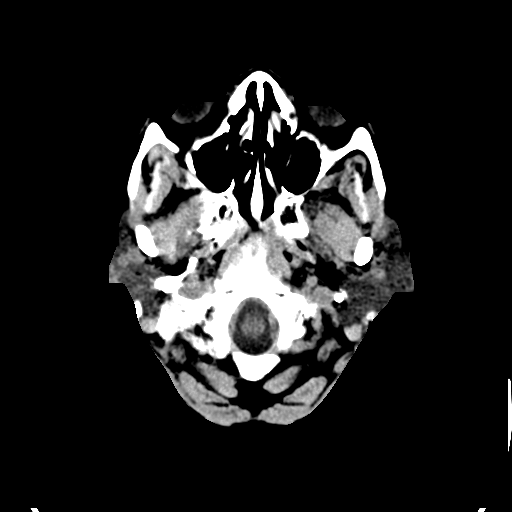
[im 2/28  bone]
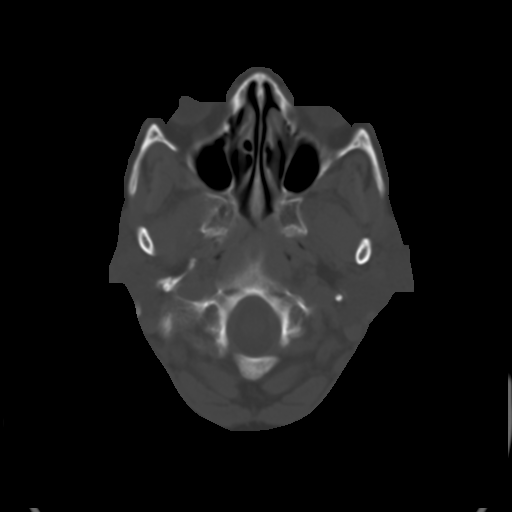
[im 4/28  brain]
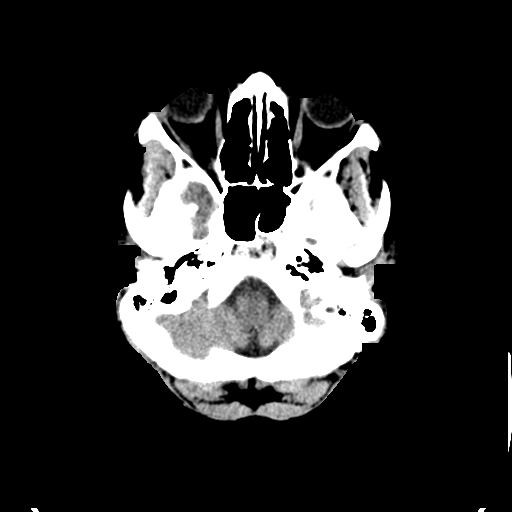
[im 6/28  brain]
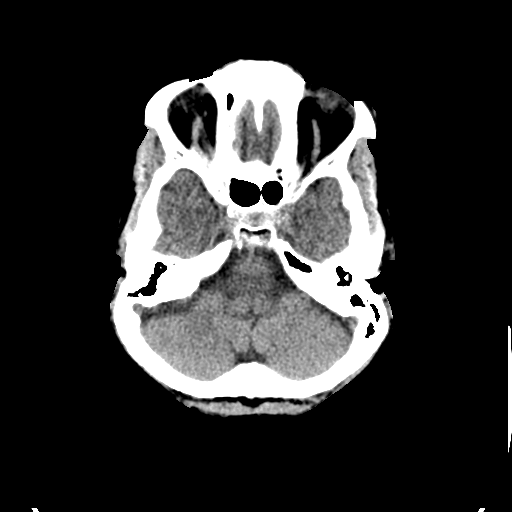
[im 8/28  brain]
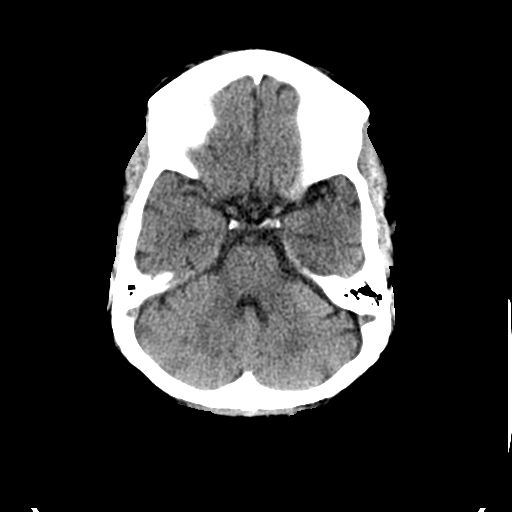
[im 10/28  brain]
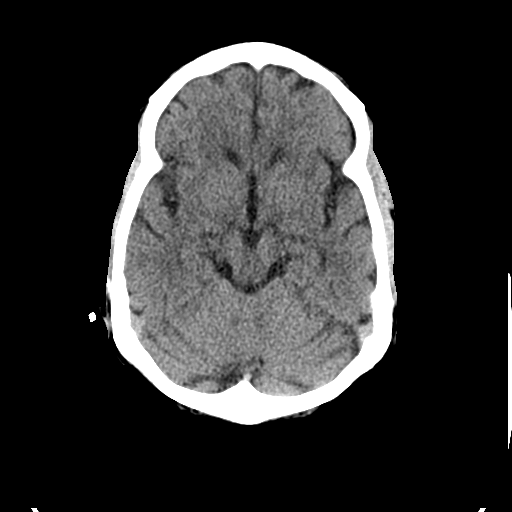
[im 10/28  bone]
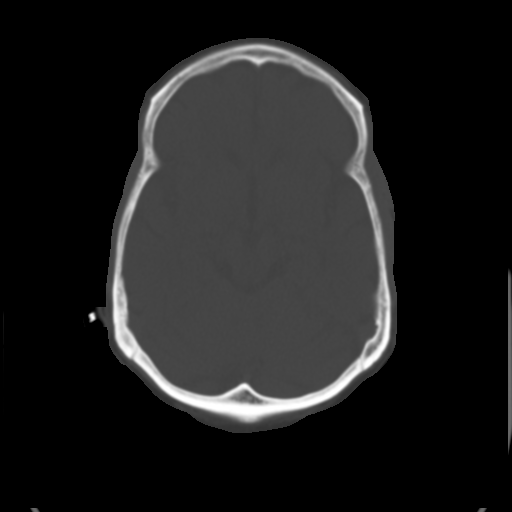
[im 12/28  brain]
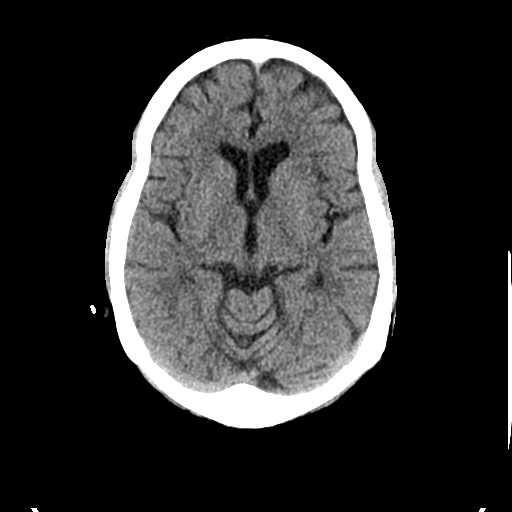
[im 14/28  brain]
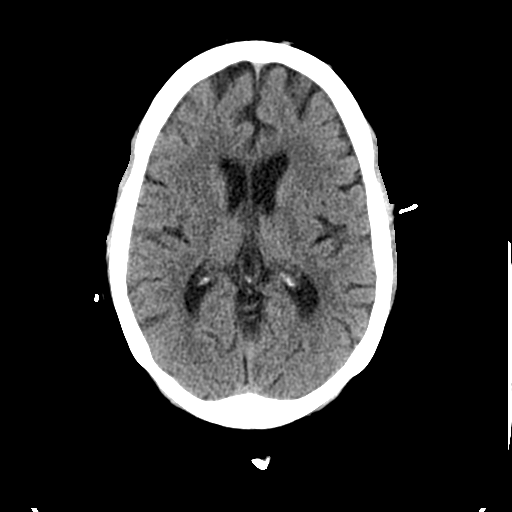
[im 16/28  brain]
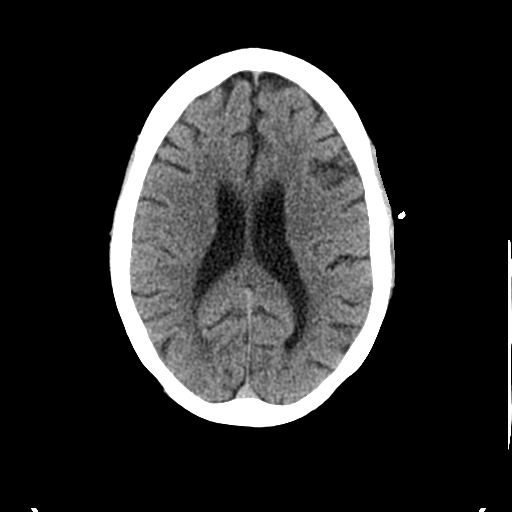
[im 18/28  brain]
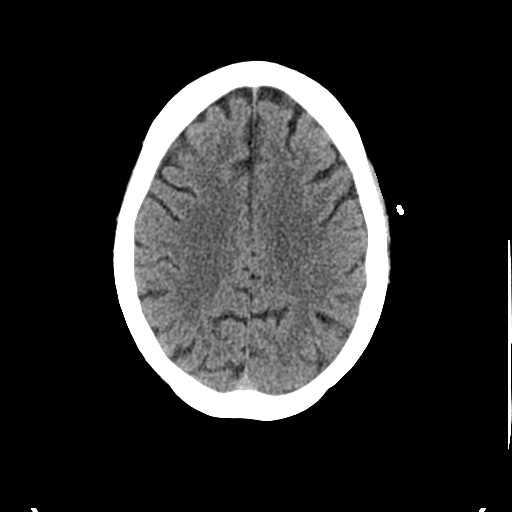
[im 18/28  bone]
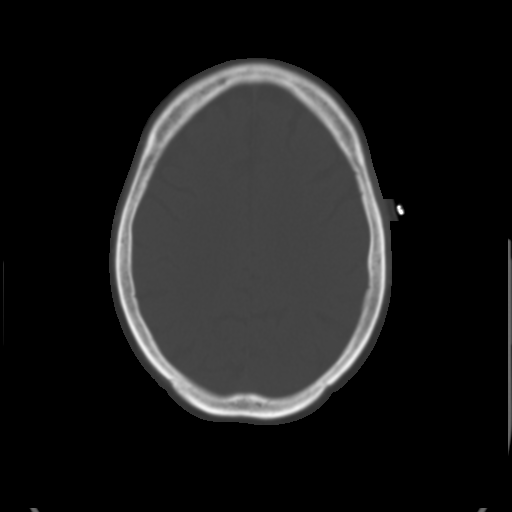
[im 20/28  brain]
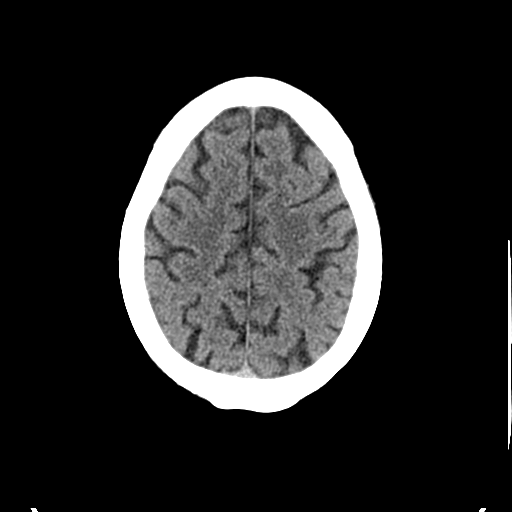
[im 22/28  brain]
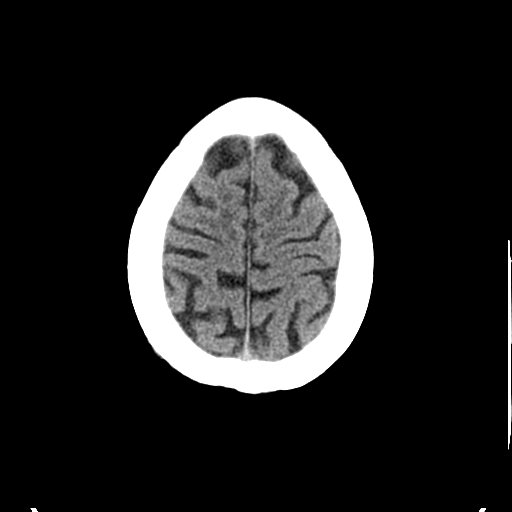
[im 24/28  brain]
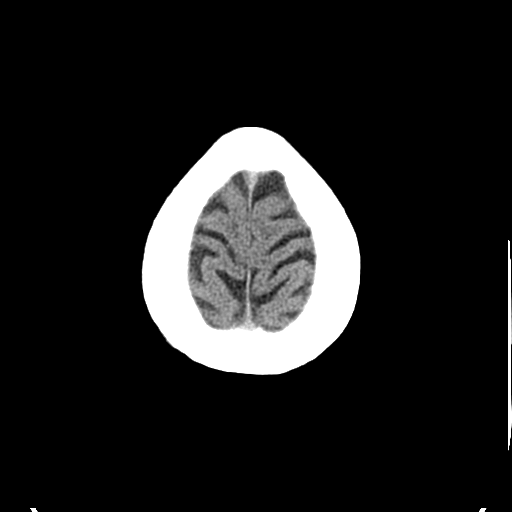
[im 26/28  brain]
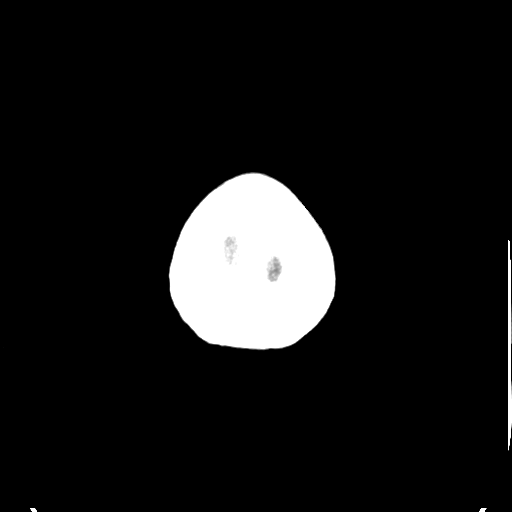
[im 26/28  bone]
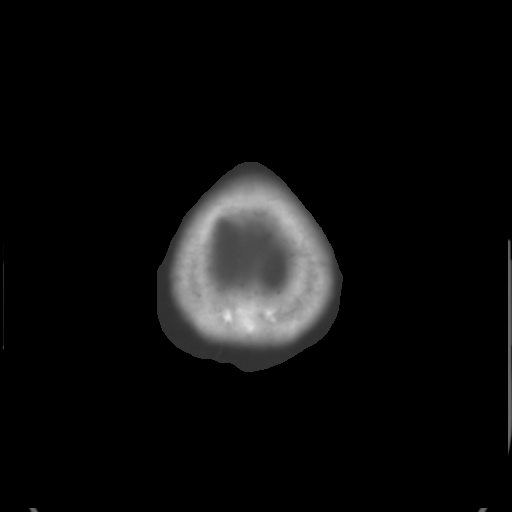

[Series 3: bone windows · axial · 0.45mm/px · z∈[-144,-124]mm · 2 of 28 slices shown]
[im 2/28  bone]
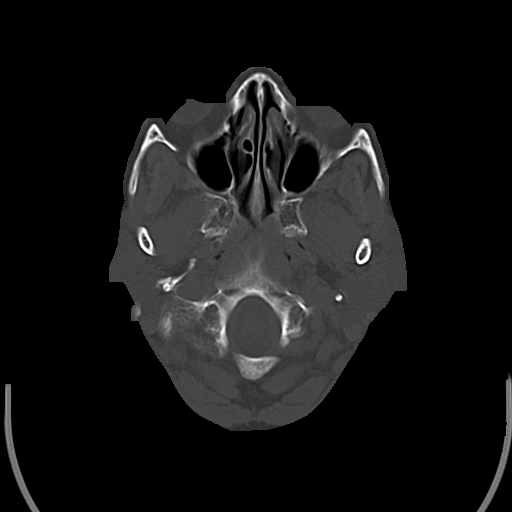
[im 6/28  bone]
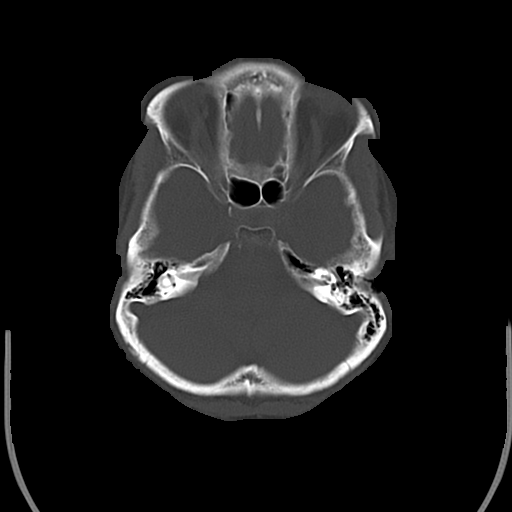

[15 of 30 positions shown; findings below may reference images not displayed]

FINDINGS: There is no evidence of acute infarction, mass lesion, or intra- or
extra-axial hemorrhage on CT.

Prominence of the ventricles and sulci suggests mild cortical volume
loss. Mild periventricular white matter change likely reflects small
vessel ischemic microangiopathy.

The brainstem and fourth ventricle are within normal limits. The
basal ganglia are unremarkable in appearance. The cerebral
hemispheres demonstrate grossly normal gray-white differentiation.
No mass effect or midline shift is seen.

There is no evidence of fracture; visualized osseous structures are
unremarkable in appearance. The visualized portions of the orbits
are within normal limits. The paranasal sinuses and mastoid air
cells are well-aerated. No significant soft tissue abnormalities are
seen.
IMPRESSION: 1. No acute intracranial pathology seen on CT.
2. Mild cortical volume loss and minimal small vessel ischemic
microangiopathy.

## 2017-01-16 IMAGING — CR DG CHEST 2V
2 series · 2 of 2 positions shown · non-contrast
Comparison: CT 11/27/2014.

CLINICAL DATA: Asthma.

EXAM:
CHEST  2 VIEW

[w chest pa]
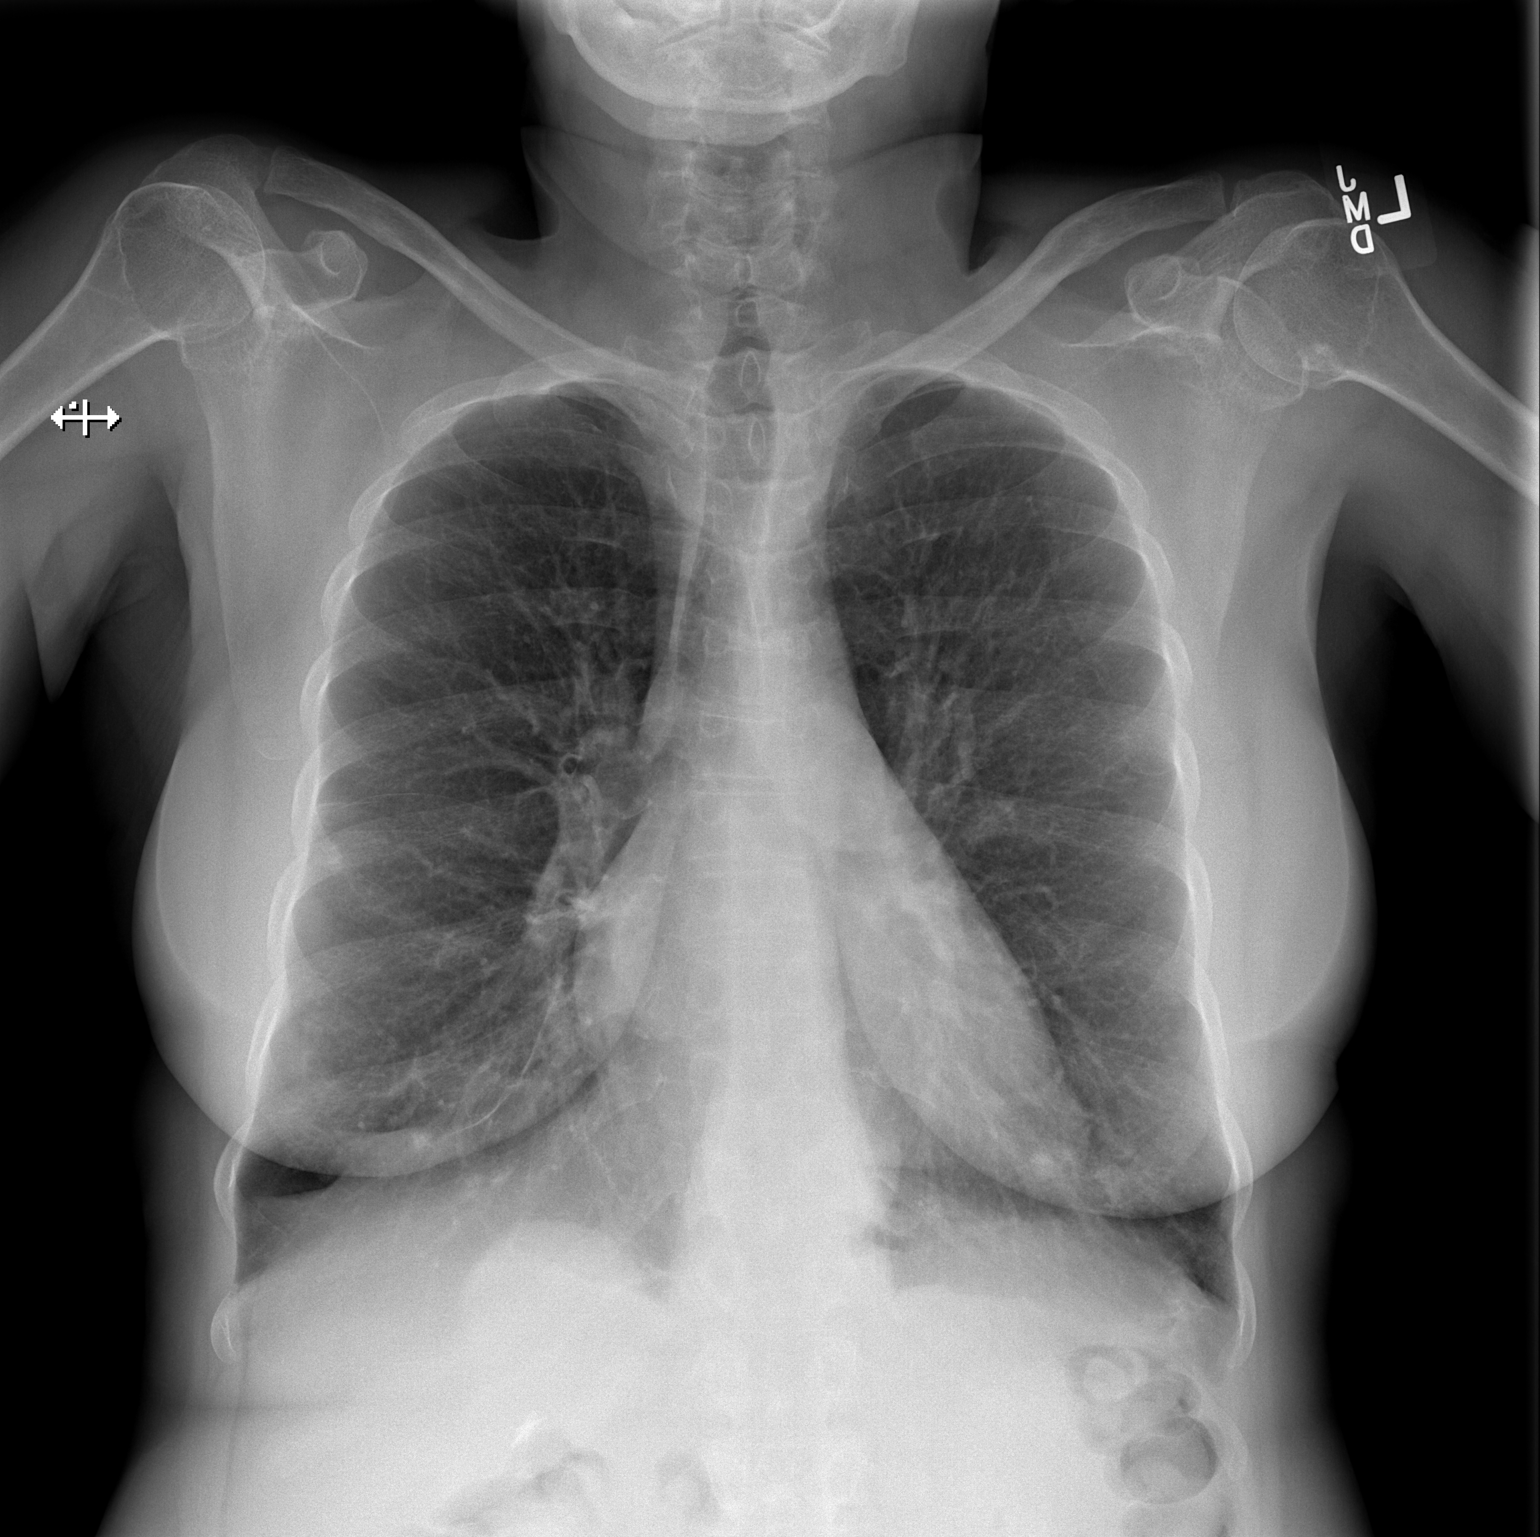

[w chest lat]
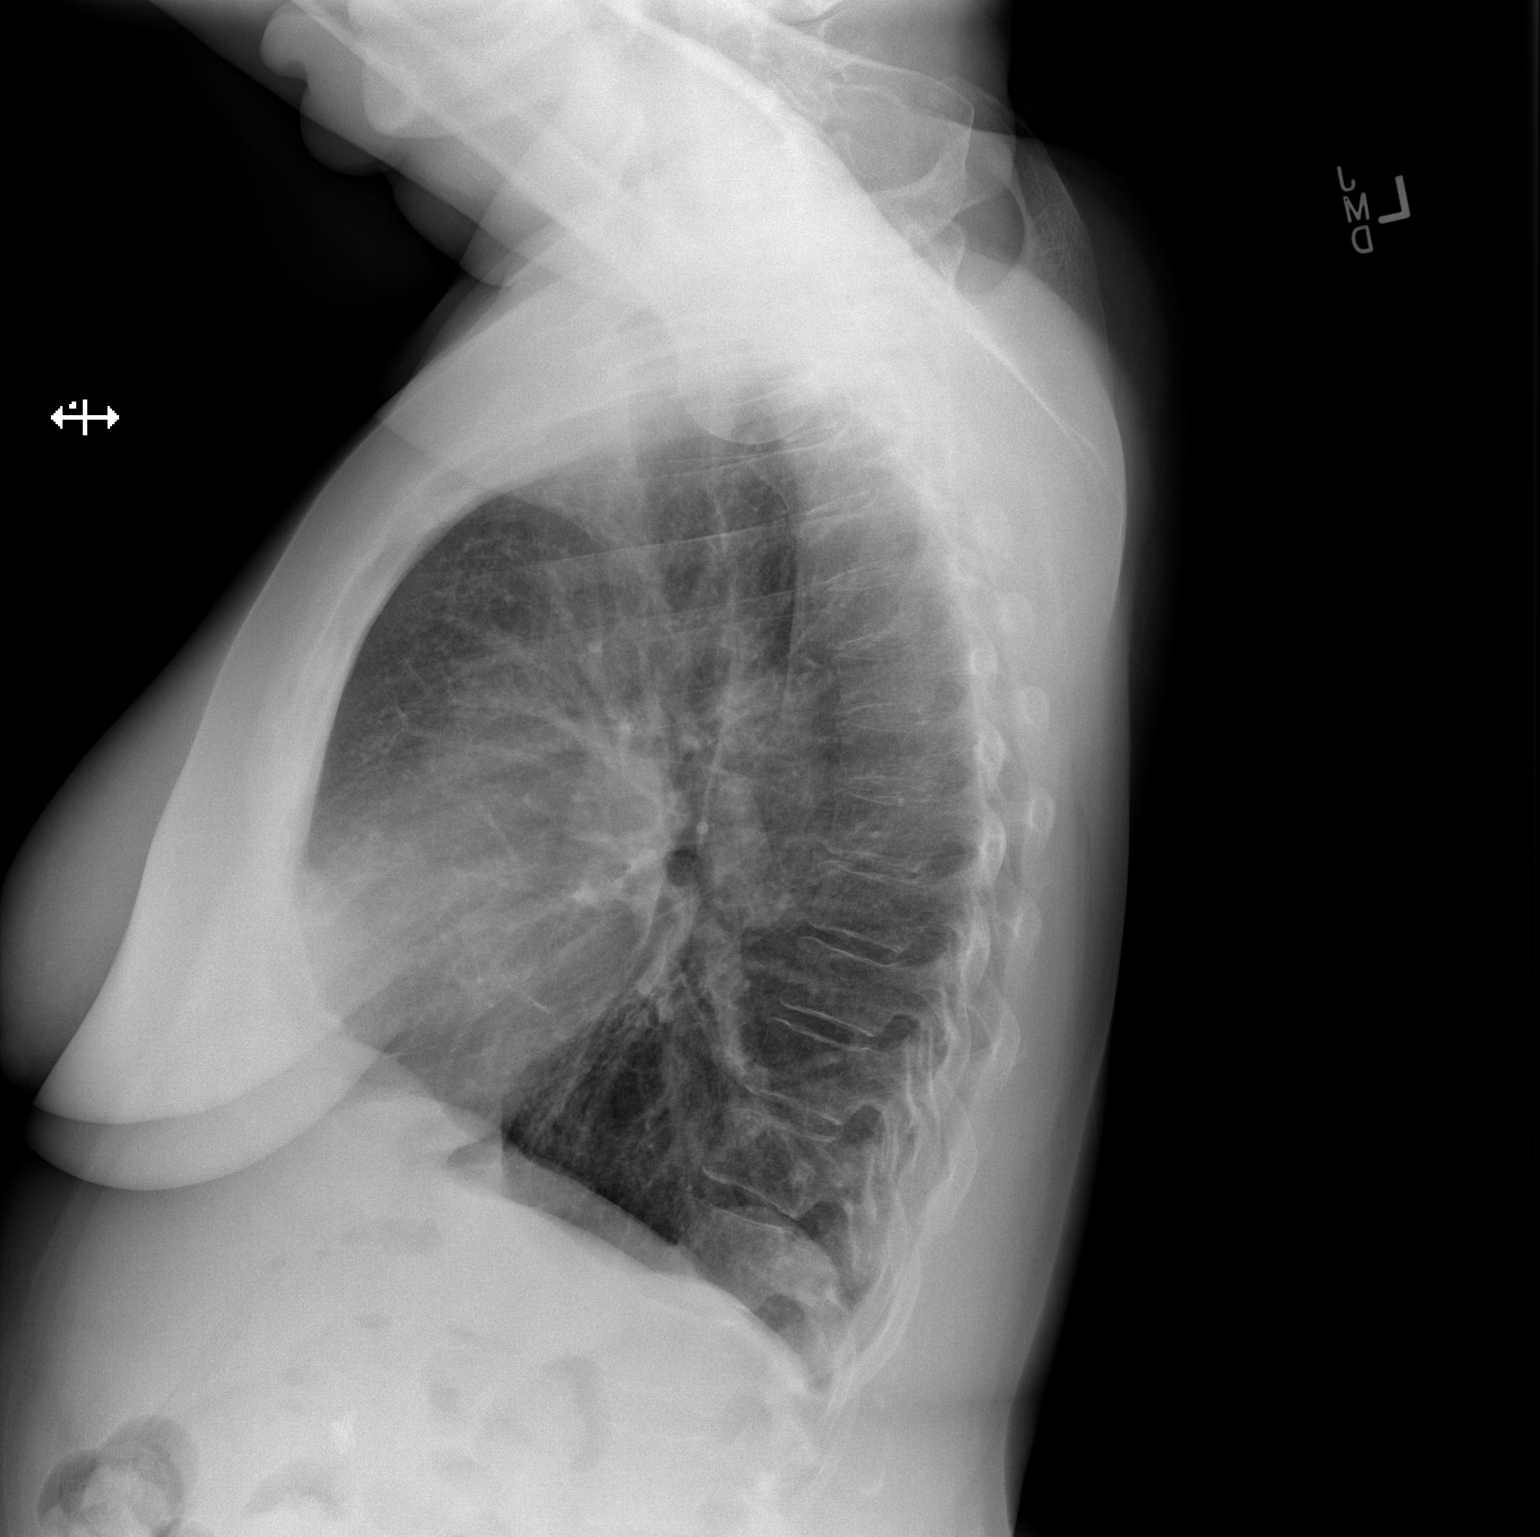

[2 of 2 positions shown; findings below may reference images not displayed]

FINDINGS: Mediastinum and hilar structures normal. Calcified pulmonary nodules
noted consistent granulomas. No pleural effusion or pneumothorax.
Degenerative changes thoracic spine.
IMPRESSION: No acute cardiopulmonary disease. Small calcified pulmonary nodules
consistent granulomas. These are stable.

## 2017-01-30 IMAGING — DX DG CHEST 1V PORT
1 series · 1 of 1 positions shown · non-contrast
Comparison: 12/03/2014

CLINICAL DATA: Shortness of breath for 2 days. Wheezing. History of
COPD.

EXAM:
PORTABLE CHEST - 1 VIEW

[chest ap]
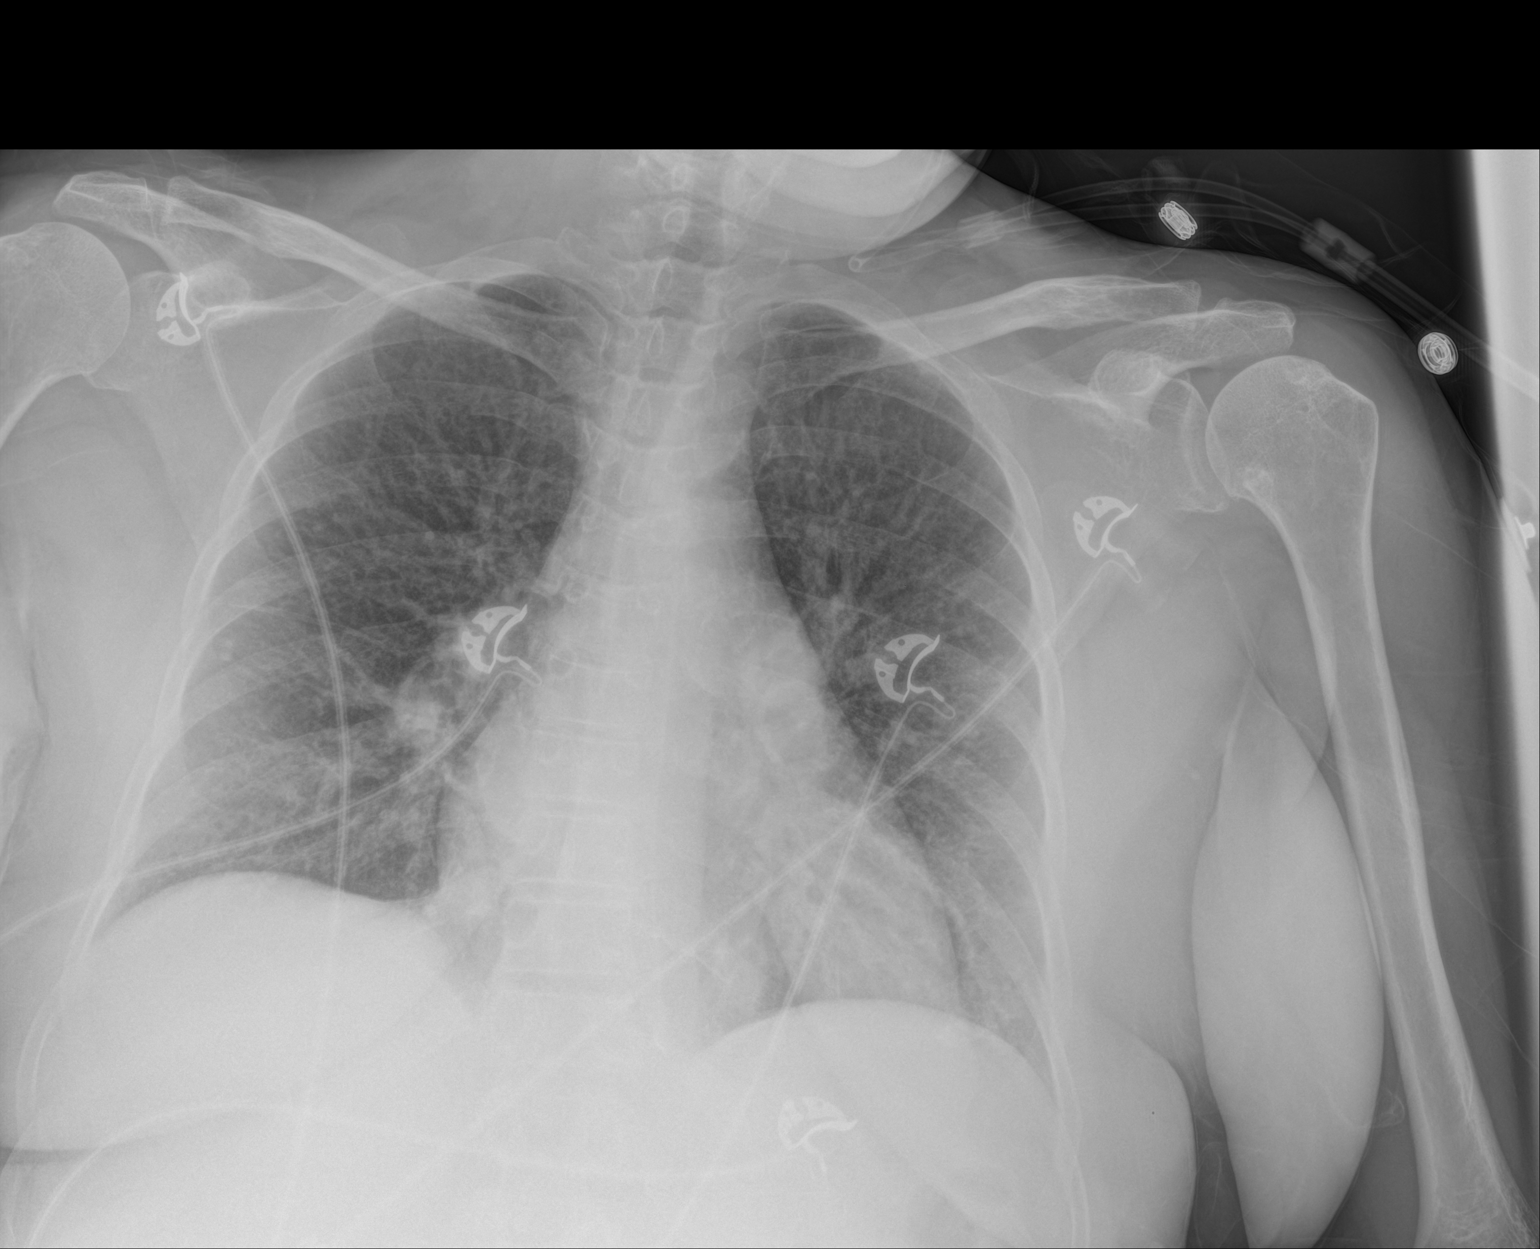

[1 of 1 positions shown; findings below may reference images not displayed]

FINDINGS: Cardiomediastinal silhouette is normal. Mediastinal contours appear
intact.

There is no evidence of focal airspace consolidation, pleural
effusion or pneumothorax. There is coarsening of the interstitial
markings. Previously demonstrated calcified few mm pulmonary nodules
are again seen.

Osseous structures are without acute abnormality. Soft tissues are
grossly normal.
IMPRESSION: Coarsening of the interstitial markings which may represent
peribronchial thickening seen with reactive airway disease or
bronchitis. Alternatively, developing interstitial pulmonary edema
may have a similar appearance.

## 2019-02-20 ENCOUNTER — Telehealth: Payer: Self-pay | Admitting: *Deleted

## 2019-02-20 NOTE — Telephone Encounter (Signed)
-----   Message from [name], MD sent at 02/20/2019  1:45 PM EST ----- Hey there, This patient has not been seen in about 2.5 years and she is behind on multiple health maintenance items. Can you call her or her family to see if she still intends to come here for primary care?  Thanks! [name], MD

## 2019-02-20 NOTE — Telephone Encounter (Signed)
Attempted to call number in chart, just kept ringing. Will try again later. Deseree Kennon Holter, CMA

## 2019-02-24 ENCOUNTER — Telehealth: Payer: Self-pay

## 2019-02-24 NOTE — Telephone Encounter (Signed)
Attempted to call patient x 3 at sister's contact number [phone] [facility]) as there is not a number in the file for patient.  There was no answer and no voicemail.  Ozella Almond, [place]
